# Patient Record
Sex: Male | Born: 1960
Health system: Southern US, Community
[De-identification: ages and names within clinical notes are randomized; demographics above are authoritative.]

## PROBLEM LIST (undated history)

## (undated) DIAGNOSIS — I10 Essential (primary) hypertension: Secondary | ICD-10-CM

## (undated) DIAGNOSIS — E785 Hyperlipidemia, unspecified: Secondary | ICD-10-CM

## (undated) DIAGNOSIS — E119 Type 2 diabetes mellitus without complications: Secondary | ICD-10-CM

## (undated) DIAGNOSIS — K219 Gastro-esophageal reflux disease without esophagitis: Secondary | ICD-10-CM

## (undated) DIAGNOSIS — M1990 Unspecified osteoarthritis, unspecified site: Secondary | ICD-10-CM

## (undated) HISTORY — DX: Type 2 diabetes mellitus without complications: E11.9

## (undated) HISTORY — DX: Hyperlipidemia, unspecified: E78.5

## (undated) HISTORY — DX: Essential (primary) hypertension: I10

## (undated) HISTORY — PX: ROOT CANAL: SHX2363

## (undated) SURGERY — EGD (ESOPHAGOGASTRODUODENOSCOPY)
Anesthesia: Moderate Sedation

---

## 2002-03-28 DIAGNOSIS — E119 Type 2 diabetes mellitus without complications: Secondary | ICD-10-CM

## 2002-03-28 HISTORY — DX: Type 2 diabetes mellitus without complications: E11.9

## 2005-03-28 HISTORY — PX: ANKLE SURGERY: SHX546

## 2005-06-18 ENCOUNTER — Emergency Department (HOSPITAL_COMMUNITY): Admission: EM | Admit: 2005-06-18 | Discharge: 2005-06-18 | Payer: Self-pay | Admitting: Emergency Medicine

## 2005-08-18 ENCOUNTER — Encounter (HOSPITAL_COMMUNITY): Admission: RE | Admit: 2005-08-18 | Discharge: 2005-09-17 | Payer: Self-pay | Admitting: Orthopedic Surgery

## 2005-09-20 ENCOUNTER — Encounter (HOSPITAL_COMMUNITY): Admission: RE | Admit: 2005-09-20 | Discharge: 2005-10-20 | Payer: Self-pay | Admitting: Orthopedic Surgery

## 2012-06-09 ENCOUNTER — Encounter: Payer: Self-pay | Admitting: *Deleted

## 2012-06-15 ENCOUNTER — Encounter: Payer: Self-pay | Admitting: Family Medicine

## 2012-06-15 ENCOUNTER — Ambulatory Visit: Payer: Self-pay | Admitting: Family Medicine

## 2012-06-15 ENCOUNTER — Ambulatory Visit (INDEPENDENT_AMBULATORY_CARE_PROVIDER_SITE_OTHER): Payer: Commercial Managed Care - PPO | Admitting: Family Medicine

## 2012-06-15 VITALS — BP 168/88 | HR 80 | Ht 70.0 in | Wt 212.0 lb

## 2012-06-15 DIAGNOSIS — E785 Hyperlipidemia, unspecified: Secondary | ICD-10-CM

## 2012-06-15 DIAGNOSIS — I10 Essential (primary) hypertension: Secondary | ICD-10-CM

## 2012-06-15 DIAGNOSIS — M25579 Pain in unspecified ankle and joints of unspecified foot: Secondary | ICD-10-CM

## 2012-06-15 DIAGNOSIS — M25572 Pain in left ankle and joints of left foot: Secondary | ICD-10-CM

## 2012-06-15 MED ORDER — HYDROCODONE-ACETAMINOPHEN 10-325 MG PO TABS: 1.0000 | ORAL_TABLET | ORAL | Status: DC | PRN

## 2012-06-15 MED ORDER — ATORVASTATIN CALCIUM 80 MG PO TABS: 80.0000 mg | ORAL_TABLET | Freq: Every day | ORAL | Status: DC

## 2012-06-15 MED ORDER — LOSARTAN POTASSIUM 100 MG PO TABS: 100.0000 mg | ORAL_TABLET | Freq: Every day | ORAL | Status: DC

## 2012-06-15 NOTE — Progress Notes (Signed)
  Subjective:    Patient ID: Terry [name], male    DOB: November 09, 1960, 52 y.o.   MRN: [id number]  Hypertension This is a chronic problem. The current episode started more than 1 year ago. The problem is unchanged. The problem is controlled. Pertinent negatives include no anxiety, blurred vision, chest pain, headaches, malaise/fatigue, neck pain, orthopnea, palpitations, peripheral edema, PND, shortness of breath or sweats. There are no associated agents to hypertension. Risk factors for coronary artery disease include diabetes mellitus, dyslipidemia, family history, male gender and obesity. Past treatments include angiotensin blockers and ACE inhibitors. The current treatment provides moderate improvement. There are no compliance problems.  There is no history of angina, kidney disease, CAD/MI, CVA, heart failure, left ventricular hypertrophy, PVD or retinopathy. There is no history of chronic renal disease, coarctation of the aorta or hyperparathyroidism.      Review of Systems  Constitutional: Negative for malaise/fatigue, activity change and appetite change.  HENT: Negative for neck pain.   Eyes: Negative for blurred vision.  Respiratory: Negative for shortness of breath.   Cardiovascular: Negative for chest pain, palpitations, orthopnea and PND.  Gastrointestinal: Negative for abdominal pain and abdominal distention.  Genitourinary: Negative for difficulty urinating.  Skin: Negative for color change, pallor and rash.  Neurological: Negative for headaches.       Objective:   Physical Exam  Constitutional: He appears well-developed.  HENT:  Head: Normocephalic.  Neck: Normal range of motion. Neck supple. No tracheal deviation present. No thyromegaly present.  Cardiovascular: Normal rate, regular rhythm and normal heart sounds.   Pulmonary/Chest: Effort normal and breath sounds normal. No respiratory distress. He has no wheezes.  Musculoskeletal: He exhibits no edema.           Assessment & Plan:  Unspecified essential hypertension - Plan: losartan (COZAAR) 100 MG tablet  Other and unspecified hyperlipidemia - Plan: atorvastatin (LIPITOR) 80 MG tablet  Arthralgia of ankle, left - Plan: HYDROcodone-acetaminophen (NORCO) 10-325 MG per tablet  Patient states his blood pressures very good outside the office I checked it twice here and it was still elevated his wife is a nurse she will check it several times over the next few weeks they will send [place] readings if it under good control no change in medication  He follows with a diabetic specialist for his other medicines although he may be shifting to at every six-month visit with them in followup with [place] in between these visits if so I told him I would like to see him in 3 months time.  He does have arthralgia of the ankle hydrocodone is used for his pain he denies abusing it. Recheck in 3 months time

## 2012-06-15 NOTE — Patient Instructions (Signed)
Stick with meds. Follow up in three months. Send bp readings

## 2012-06-22 ENCOUNTER — Ambulatory Visit: Payer: Self-pay | Admitting: Family Medicine

## 2012-07-08 ENCOUNTER — Other Ambulatory Visit: Payer: Self-pay | Admitting: Family Medicine

## 2012-07-20 ENCOUNTER — Other Ambulatory Visit: Payer: Self-pay | Admitting: Family Medicine

## 2012-08-16 ENCOUNTER — Telehealth: Payer: Self-pay | Admitting: Family Medicine

## 2012-08-16 NOTE — Telephone Encounter (Signed)
Patient needs BW paperwork. Patient also started [place] several months ago [employer]

## 2012-08-16 NOTE — Telephone Encounter (Signed)
Patient on [place]. Not sure what bloodwork to order.

## 2012-08-17 ENCOUNTER — Other Ambulatory Visit: Payer: Self-pay | Admitting: Family Medicine

## 2012-08-17 DIAGNOSIS — E785 Hyperlipidemia, unspecified: Secondary | ICD-10-CM

## 2012-08-17 DIAGNOSIS — IMO0001 Reserved for inherently not codable concepts without codable children: Secondary | ICD-10-CM

## 2012-08-17 NOTE — Telephone Encounter (Signed)
Patient was on Zetia. The lab forms were printed off. Wife will come by to pick them up.

## 2012-08-19 LAB — LIPID PANEL
Cholesterol: 138 mg/dL (ref 0–200)
HDL: 23 mg/dL — ABNORMAL LOW (ref >39)
Total CHOL/HDL Ratio: 6 Ratio
Triglycerides: 447 mg/dL — ABNORMAL HIGH (ref <150)

## 2012-08-19 LAB — BASIC METABOLIC PANEL
CO2: 29 mEq/L (ref 19–32)
Chloride: 102 mEq/L (ref 96–112)

## 2012-08-19 LAB — HEPATIC FUNCTION PANEL
Albumin: 4.2 g/dL (ref 3.5–5.2)
Total Bilirubin: 0.3 mg/dL (ref 0.3–1.2)

## 2012-08-27 ENCOUNTER — Encounter: Payer: Self-pay | Admitting: Family Medicine

## 2012-08-27 ENCOUNTER — Ambulatory Visit (INDEPENDENT_AMBULATORY_CARE_PROVIDER_SITE_OTHER): Payer: 59 | Admitting: Family Medicine

## 2012-08-27 VITALS — BP 170/93 | HR 98 | Wt 210.8 lb

## 2012-08-27 DIAGNOSIS — E781 Pure hyperglyceridemia: Secondary | ICD-10-CM | POA: Insufficient documentation

## 2012-08-27 DIAGNOSIS — M25579 Pain in unspecified ankle and joints of unspecified foot: Secondary | ICD-10-CM

## 2012-08-27 DIAGNOSIS — I10 Essential (primary) hypertension: Secondary | ICD-10-CM

## 2012-08-27 DIAGNOSIS — E785 Hyperlipidemia, unspecified: Secondary | ICD-10-CM

## 2012-08-27 DIAGNOSIS — M25572 Pain in left ankle and joints of left foot: Secondary | ICD-10-CM

## 2012-08-27 DIAGNOSIS — M2550 Pain in unspecified joint: Secondary | ICD-10-CM

## 2012-08-27 DIAGNOSIS — IMO0001 Reserved for inherently not codable concepts without codable children: Secondary | ICD-10-CM

## 2012-08-27 DIAGNOSIS — E119 Type 2 diabetes mellitus without complications: Secondary | ICD-10-CM | POA: Insufficient documentation

## 2012-08-27 MED ORDER — HYDROCODONE-ACETAMINOPHEN 10-325 MG PO TABS: 1.0000 | ORAL_TABLET | Freq: Four times a day (QID) | ORAL | Status: DC | PRN

## 2012-08-27 MED ORDER — NAPROXEN 500 MG PO TABS: 500.0000 mg | ORAL_TABLET | Freq: Two times a day (BID) | ORAL | Status: DC

## 2012-08-27 NOTE — Progress Notes (Signed)
  Subjective:    Patient ID: Terry [name], male    DOB: Aug 03, 1960, 52 y.o.   MRN: [id number]  Hand Pain  The incident occurred more than 1 week ago. There was no injury mechanism. The pain is present in the left hand and right hand. The quality of the pain is described as aching and shooting. The pain does not radiate. The pain is at a severity of 3/10. The pain has been fluctuating since the incident. Pertinent negatives include no numbness or tingling. He has tried nothing for the symptoms.   Patient's diabetes fairly good control #oh pretty good but he does admit to eating too much starches and breads and potatoes. He is going to do better on his diet and take his medicines as directed. Also his endocrinologist they did lab work that showed his LDL particles low density were very high and recommended that he take Zetia but he cannot afford this medicine. He does not smoke. He does state he has a lot of joint discomforts more so in his hands but also in his hip.   Review of Systems  Neurological: Negative for tingling and numbness.  Denies chest pressure tightness pain fever sweats chills.     Objective:   Physical Exam Lungs are clear, heart is regular, pulse normal, foot exam is normal. Pulses are normal. Calf is normal. Knees are normal has decreased range of motion in the hips he does have subjective arthralgias in his hands no apparent swelling but somewhat limited range of motion. Skin warm dry.       Assessment & Plan:  #1 arthralgias-his endocrinologist had done some lab testing on him and told him he did not have rheumatoid arthritis. Naprosyn 500 mg twice a day, hydrocodone maximum 3-4 times per day cautioned drowsiness. #2 diabetes fairly good control continue current measures recheck this again in 3 months time #3 hypertension-his wife is a nurse she checks blood pressures at home and they have actually been pretty good she will continue these measures and bring his blood  pressure monitor with him on the next visit Patient should followup again in approximately 3-4 months and will need some lab work before that visit. He is going back up on his statin dose and stopping Zetia

## 2012-10-10 ENCOUNTER — Other Ambulatory Visit: Payer: Self-pay | Admitting: Family Medicine

## 2012-11-05 ENCOUNTER — Other Ambulatory Visit: Payer: Self-pay | Admitting: Family Medicine

## 2012-11-08 ENCOUNTER — Other Ambulatory Visit: Payer: Self-pay

## 2012-11-30 ENCOUNTER — Ambulatory Visit: Payer: 59 | Admitting: Family Medicine

## 2012-12-05 ENCOUNTER — Encounter: Payer: Self-pay | Admitting: Family Medicine

## 2012-12-05 ENCOUNTER — Ambulatory Visit (INDEPENDENT_AMBULATORY_CARE_PROVIDER_SITE_OTHER): Payer: 59 | Admitting: Family Medicine

## 2012-12-05 VITALS — BP 168/88 | Ht 70.0 in | Wt 204.0 lb

## 2012-12-05 DIAGNOSIS — M25572 Pain in left ankle and joints of left foot: Secondary | ICD-10-CM

## 2012-12-05 DIAGNOSIS — E785 Hyperlipidemia, unspecified: Secondary | ICD-10-CM

## 2012-12-05 DIAGNOSIS — M25579 Pain in unspecified ankle and joints of unspecified foot: Secondary | ICD-10-CM

## 2012-12-05 DIAGNOSIS — E119 Type 2 diabetes mellitus without complications: Secondary | ICD-10-CM

## 2012-12-05 DIAGNOSIS — I10 Essential (primary) hypertension: Secondary | ICD-10-CM

## 2012-12-05 MED ORDER — HYDROCODONE-ACETAMINOPHEN 10-325 MG PO TABS: ORAL_TABLET | ORAL | Status: DC

## 2012-12-05 MED ORDER — ATORVASTATIN CALCIUM 80 MG PO TABS: 80.0000 mg | ORAL_TABLET | Freq: Every day | ORAL | Status: DC

## 2012-12-05 MED ORDER — LOSARTAN POTASSIUM 100 MG PO TABS: ORAL_TABLET | ORAL | Status: DC

## 2012-12-05 MED ORDER — NAPROXEN 500 MG PO TABS: 500.0000 mg | ORAL_TABLET | Freq: Two times a day (BID) | ORAL | Status: DC

## 2012-12-05 NOTE — Patient Instructions (Signed)
As part of your visit today we have covered chronic pain. You have been given prescription for pain medicines. Certain pain medications such as hydrocodone  allow for refills. Other pain medications such as oxycodone require separate prescriptions. Nonetheless, your expected to come in for a office visit before further pain medications are issued.   We will not refill medications or early nor will we give an extended month supply at the end of these prescriptions.It is your responsibility to keep up with medications. They will not be replaced.  It is your responsibility to schedule an office visit in 3-4 months to be seen before you are out of your medication. Do not call our office to request early refills or additional refills.   We are required by law to adhere to regulations. Failure on our part to follow these regulations could jeopardize our prescription license which in turn would cause us not to be able to care for you.  

## 2012-12-05 NOTE — Progress Notes (Signed)
  Subjective:    Patient ID: Terry [name], male    DOB: 12/20/1960, 52 y.o.   MRN: [id number]  HPI Patient arrives for a follow up on his diabetes and hypertension. The patient was seen today as part of a comprehensive diabetic check up. The patient had the following elements completed: -Review of medication compliance -Review of glucose monitoring results -Review of any complications do to high or low sugars -Diabetic foot exam was completed as part of today's visit. The following was also discussed: -Importance of yearly eye exams -Importance of following diabetic/low sugar-starch diet -Importance of exercise and regular activity -Importance of regular followup visits. -Most recent hemoglobin A1c were reviewed with the patient along with goals regarding diabetes. This patient was seen today for chronic pain  The medication list was reviewed and updated.  Discussion was held with the patient regarding compliance with pain medication. The patient was advised the importance of maintaining medication and not using illegal substances with these. The patient was educated that we can provide 3 monthly scripts for their medication, it is their responsibility to follow the instructions. Discussion was held with the patient to make sure they're not having significant side effects. Patient is aware that pain medications are meant to minimize the severity of the pain to allow their pain levels to improve to allow for better function. They are aware of that pain medications cannot totally remove their pain.     Review of Systems  Constitutional: Negative for fever, activity change, appetite change and fatigue.  HENT: Negative for congestion and rhinorrhea.   Respiratory: Negative for cough.   Cardiovascular: Negative for chest pain, palpitations and leg swelling.  Gastrointestinal: Negative for abdominal pain.   Eye exam yearly rec, paced states he's doing later this year Family history  reviewed social does not smoke    Objective:   Physical Exam Eardrums normal throat is normal neck no masses lungs clear no crackles heart is regular abdomen soft no masses straining he is no edema sensory to touch diminished diabetic foot exam completed       Assessment & Plan:  Lipid / liv /met 7 /ur microprot Hyperlipidemia continue medication watch diet closely Diabetes fairly decent control watch diet exercise continue medication Chronic pain-prescriptions written followup 3 months Hypertension elevated here in the office but when checked with his monitor it comes very close to ours and his blood pressure readings at home look great I believe he has element of whitecoat hypertension

## 2013-01-14 ENCOUNTER — Ambulatory Visit (INDEPENDENT_AMBULATORY_CARE_PROVIDER_SITE_OTHER): Payer: 59 | Admitting: Family Medicine

## 2013-01-14 ENCOUNTER — Encounter: Payer: Self-pay | Admitting: Family Medicine

## 2013-01-14 VITALS — BP 160/90 | Temp 99.5°F | Ht 70.0 in | Wt 204.8 lb

## 2013-01-14 DIAGNOSIS — J029 Acute pharyngitis, unspecified: Secondary | ICD-10-CM

## 2013-01-14 LAB — POCT RAPID STREP A (OFFICE): Rapid Strep A Screen: NEGATIVE

## 2013-01-14 MED ORDER — VALACYCLOVIR HCL 1 G PO TABS: 1000.0000 mg | ORAL_TABLET | Freq: Three times a day (TID) | ORAL | Status: AC

## 2013-01-14 NOTE — Progress Notes (Signed)
  Subjective:    Patient ID: Terry [name], male    DOB: [birth year], 52 y.o.   MRN: [id number]  Sore Throat  This is a new problem. The current episode started in the past 7 days. Neither side of throat is experiencing more pain than the other. Maximum temperature: 99.5. The fever has been present for 1 to 2 days. Associated symptoms include headaches and trouble swallowing. Associated symptoms comments: Body aches. He has tried NSAIDs for the symptoms. The treatment provided mild relief.   he does have a history of diabetes he states that his numbers have actually gone up to some degree. He also relates low-grade fever. Doesn't feel good. Denies sweats chills nausea vomiting shortness of breath.  Social doesn't smoke  Review of Systems  HENT: Positive for trouble swallowing.   Neurological: Positive for headaches.       Objective:   Physical Exam Lungs are clear hearts regular neck is supple significant viral stomatitis noted.  Rapid strep test negative.     Assessment & Plan:  Viral stomatitis-Valtrex 3 times a day for the next 7 days, patient relates that causes pain with eating I told him he may use Orabase-B as needed cautioned him in. In addition to this followup if ongoing troubles this may take up to a couple weeks to go away Diabetes-his numbers are going to be somewhat elevated over the course of next 7-10 days. He may adjust up on his insulin as necessary to get it under control

## 2013-01-15 LAB — STREP A DNA PROBE: GASP: NEGATIVE

## 2013-01-17 NOTE — Progress Notes (Signed)
Sent card regarding: Call  patient their test/xrays are normal, informed patient secondary test on throat swab was negative for strep he ought to see some improvement in his throat by the end of the week.

## 2013-01-31 ENCOUNTER — Other Ambulatory Visit: Payer: Self-pay

## 2013-02-25 LAB — HM DIABETES EYE EXAM

## 2013-04-01 ENCOUNTER — Telehealth: Payer: Self-pay | Admitting: Family Medicine

## 2013-04-01 NOTE — Telephone Encounter (Signed)
Prior Auth obtained for patient's ACCU-CHECK COMPACT PLUS diabetic test strips, pt is approved for 600 strips/30 days, auth expires 04/01/14 through [facility] ([employer]) Faxed approval to [employer]

## 2013-05-10 ENCOUNTER — Other Ambulatory Visit: Payer: Self-pay | Admitting: Family Medicine

## 2013-05-11 LAB — BASIC METABOLIC PANEL
BUN: 12 mg/dL (ref 6–23)
CO2: 31 mEq/L (ref 19–32)
Calcium: 9.8 mg/dL (ref 8.4–10.5)
Chloride: 99 mEq/L (ref 96–112)
Creat: 0.98 mg/dL (ref 0.50–1.35)
Glucose, Bld: 169 mg/dL — ABNORMAL HIGH (ref 70–99)
Potassium: 4.2 mEq/L (ref 3.5–5.3)
Sodium: 138 mEq/L (ref 135–145)

## 2013-05-11 LAB — HEPATIC FUNCTION PANEL
ALT: 29 U/L (ref 0–53)
AST: 21 U/L (ref 0–37)
Albumin: 4.3 g/dL (ref 3.5–5.2)
Alkaline Phosphatase: 49 U/L (ref 39–117)
Bilirubin, Direct: 0.1 mg/dL (ref 0.0–0.3)
Indirect Bilirubin: 0.3 mg/dL (ref 0.2–1.2)
Total Bilirubin: 0.4 mg/dL (ref 0.2–1.2)
Total Protein: 6.9 g/dL (ref 6.0–8.3)

## 2013-05-11 LAB — MICROALBUMIN, URINE: Microalb, Ur: 3.36 mg/dL — ABNORMAL HIGH (ref 0.00–1.89)

## 2013-05-11 LAB — LIPID PANEL
Cholesterol: 169 mg/dL (ref 0–200)
HDL: 27 mg/dL — ABNORMAL LOW (ref >39)
Total CHOL/HDL Ratio: 6.3 Ratio
Triglycerides: 415 mg/dL — ABNORMAL HIGH (ref <150)

## 2013-05-13 ENCOUNTER — Encounter: Payer: Self-pay | Admitting: Family Medicine

## 2013-05-21 ENCOUNTER — Encounter: Payer: Self-pay | Admitting: Family Medicine

## 2013-05-21 ENCOUNTER — Ambulatory Visit (INDEPENDENT_AMBULATORY_CARE_PROVIDER_SITE_OTHER): Payer: 59 | Admitting: Family Medicine

## 2013-05-21 VITALS — BP 138/98 | Ht 70.0 in | Wt 205.0 lb

## 2013-05-21 DIAGNOSIS — IMO0001 Reserved for inherently not codable concepts without codable children: Secondary | ICD-10-CM

## 2013-05-21 DIAGNOSIS — I10 Essential (primary) hypertension: Secondary | ICD-10-CM

## 2013-05-21 DIAGNOSIS — E785 Hyperlipidemia, unspecified: Secondary | ICD-10-CM

## 2013-05-21 DIAGNOSIS — E119 Type 2 diabetes mellitus without complications: Secondary | ICD-10-CM

## 2013-05-21 DIAGNOSIS — M2550 Pain in unspecified joint: Secondary | ICD-10-CM

## 2013-05-21 DIAGNOSIS — E1165 Type 2 diabetes mellitus with hyperglycemia: Secondary | ICD-10-CM

## 2013-05-21 LAB — POCT GLYCOSYLATED HEMOGLOBIN (HGB A1C): HEMOGLOBIN A1C: 8.2

## 2013-05-21 MED ORDER — OXYCODONE-ACETAMINOPHEN 10-325 MG PO TABS: ORAL_TABLET | ORAL | Status: DC

## 2013-05-21 MED ORDER — METFORMIN HCL ER 500 MG PO TB24: 500.0000 mg | ORAL_TABLET | Freq: Every day | ORAL | Status: DC

## 2013-05-21 NOTE — Progress Notes (Signed)
Subjective:    Patient ID: Terry [name], male    DOB: [birth year], 53 y.o.   MRN: [id number]  Diabetes He presents for his follow-up diabetic visit. He has type 2 diabetes mellitus. His breakfast blood glucose range is generally >200 mg/dl. His lunch blood glucose range is generally >200 mg/dl. His dinner blood glucose range is generally >200 mg/dl. His highest blood glucose is >200 mg/dl. His overall blood glucose range is >200 mg/dl. He does not see a podiatrist.Eye exam is current.   Need rx for relion pen needles.   Discuss changing hydrocodone to oxycodone. He relates his arthralgias been bothering him a lot hydrocodone no longer works see discussion below. He denies abusing medication. He understands that pain medication is to allow for the pain to come under better control he works with his hands a lot he suffers with arthralgias in both hands each morning and during the day more so on today's work he works a lot also suffers with knee pain when he is on his knees a lot  He relates he could be doing a better job with diet and understands that he needs to get that under better control he does relate compliance with cholesterol medicine.  He does use Cialis occasionally he stated that he will discuss at [place] the generic for Viagra and if he wanted to try that he will let us   Review of Systems He denies blurred vision he does relate increased thirst he denies fever chills denies sweats nausea vomiting. Denies chest tightness pressure pain.    Objective:   Physical Exam Neck no masses lungs are clear heart is regular no murmurs pulses are normal abdomen is soft no masses extremities no edema foot exam is normal       Assessment & Plan:  #1 diabetes-subpar control. We talked about adjustment of his insulin. He is currently using the long-acting insulin he splits into 2 doses. I told him he needs to gradually go up by 4-8 units per week until his fasting sugars are in  the 100-120 range. Hopefully this will allow him to get better control urine today without as much short acting insulin. He is to continue the short acting insulin with meals and with snacks. He typically is taking approximately 6-8 shots per day. Sometimes as many as 10. This needs to get under better control. Patient understands this. He will also continue Victoza I also encouraged him to be much better at minimizing starches in his diet finally we will also add metformin XR 500 mg one each day HE will let us know if he does not tolerate this.  #2 HTN patient relates blood pressure readings that are outside the office he is to work harder at watching diet you also try to fit in some walking.  #3 micro-per proteinuria he is work hard on diet getting A1c under better control continue low starch and  #4 elevated triglycerides this is secondary to his diabetes not been under good control he can continue fish oil but he must do a better job getting diabetes under better control  #5 arthralgias-hydrocodone no longer working for him. He does admit that he took a half of his wife's oxycodone and it did help him. I wrote him a prescription for Percocet 10 mg/325 one every 6 hours as needed for severe pain use sparingly he was encouraged only to use his medicines and he was also told that his medicines are for him and not  her family.  He currently cannot afford the co-pays with the specialist and he will followup here. We will do our best for him  Patient is to followup in 3-4 months on next visit he needs to do a hemoglobin A1c, lipid profile, PSA

## 2013-05-21 NOTE — Patient Instructions (Signed)
Levimir- keep bumping up the dose from week to week (4 to 8 units per week ) gradually getting the am fasting glucose to be near 100  Recheck A1 C here in approx 3 to 4 months  Add Metformin XR 500 one daily- if can not tolerate please let me know  We will use Percocet for your pain , avoid taking frequently in order to lessen risk of addiction.

## 2013-05-24 ENCOUNTER — Other Ambulatory Visit: Payer: Self-pay | Admitting: Family Medicine

## 2013-06-13 ENCOUNTER — Telehealth: Payer: Self-pay | Admitting: Family Medicine

## 2013-06-13 MED ORDER — SULFACETAMIDE SODIUM 10 % OP SOLN: 1.0000 [drp] | Freq: Four times a day (QID) | OPHTHALMIC | Status: AC

## 2013-06-13 NOTE — Telephone Encounter (Signed)
Sulfacetamide drops one to 2 drops each eye 4 times daily for the next 5 days, followup if ongoing troubles

## 2013-06-13 NOTE — Telephone Encounter (Signed)
Pt is having heavy drainage in both eyes, a yellowish drainage Its constant through out the day   Can we call him in something for this to [facility]

## 2013-06-13 NOTE — Telephone Encounter (Signed)
Rx sent electronically to pharmacy. Patient notified. 

## 2013-06-19 ENCOUNTER — Other Ambulatory Visit: Payer: Self-pay | Admitting: Family Medicine

## 2013-07-09 ENCOUNTER — Telehealth: Payer: Self-pay | Admitting: Family Medicine

## 2013-07-09 MED ORDER — INSULIN LISPRO 100 UNIT/ML (KWIKPEN): PEN_INJECTOR | SUBCUTANEOUS | Status: DC

## 2013-07-09 NOTE — Telephone Encounter (Signed)
Rx sent electronically to pharmacy. 

## 2013-07-09 NOTE — Telephone Encounter (Signed)
Patient requesting refill on Humalog Kwikpen 45, Inject up to 140 to 150 units subcutaneously every day. [place]

## 2013-07-25 ENCOUNTER — Telehealth: Payer: Self-pay | Admitting: Family Medicine

## 2013-07-25 MED ORDER — OXYCODONE-ACETAMINOPHEN 10-325 MG PO TABS: ORAL_TABLET | ORAL | Status: DC

## 2013-07-25 NOTE — Telephone Encounter (Signed)
Last seen 05/21/13

## 2013-07-25 NOTE — Telephone Encounter (Signed)
Patient needs Rx for oxycodone °

## 2013-07-25 NOTE — Telephone Encounter (Signed)
May have a prescription refill on this. Explained to patient it is a schedule 2 drug I would need to see him by the end of May for his next refill

## 2013-07-25 NOTE — Telephone Encounter (Signed)
[name] was notified. She will pick up prescription.

## 2013-08-15 ENCOUNTER — Other Ambulatory Visit: Payer: Self-pay | Admitting: Family Medicine

## 2013-08-29 ENCOUNTER — Encounter: Payer: Self-pay | Admitting: Family Medicine

## 2013-08-29 ENCOUNTER — Ambulatory Visit (INDEPENDENT_AMBULATORY_CARE_PROVIDER_SITE_OTHER): Payer: 59 | Admitting: Family Medicine

## 2013-08-29 VITALS — BP 178/100 | Ht 70.0 in | Wt 211.0 lb

## 2013-08-29 DIAGNOSIS — IMO0001 Reserved for inherently not codable concepts without codable children: Secondary | ICD-10-CM

## 2013-08-29 DIAGNOSIS — E785 Hyperlipidemia, unspecified: Secondary | ICD-10-CM

## 2013-08-29 DIAGNOSIS — M2550 Pain in unspecified joint: Secondary | ICD-10-CM

## 2013-08-29 DIAGNOSIS — I10 Essential (primary) hypertension: Secondary | ICD-10-CM

## 2013-08-29 DIAGNOSIS — E1165 Type 2 diabetes mellitus with hyperglycemia: Principal | ICD-10-CM

## 2013-08-29 LAB — POCT GLYCOSYLATED HEMOGLOBIN (HGB A1C): Hemoglobin A1C: 7.8

## 2013-08-29 MED ORDER — INSULIN LISPRO 100 UNIT/ML (KWIKPEN): PEN_INJECTOR | SUBCUTANEOUS | Status: DC

## 2013-08-29 MED ORDER — INSULIN DETEMIR 100 UNIT/ML FLEXPEN: PEN_INJECTOR | SUBCUTANEOUS | Status: DC

## 2013-08-29 MED ORDER — OXYCODONE-ACETAMINOPHEN 10-325 MG PO TABS: ORAL_TABLET | ORAL | Status: DC

## 2013-08-29 MED ORDER — LIRAGLUTIDE 18 MG/3ML ~~LOC~~ SOPN: PEN_INJECTOR | SUBCUTANEOUS | Status: DC

## 2013-08-29 MED ORDER — NAPROXEN 500 MG PO TABS: 500.0000 mg | ORAL_TABLET | Freq: Two times a day (BID) | ORAL | Status: DC

## 2013-08-29 MED ORDER — LOSARTAN POTASSIUM 100 MG PO TABS: ORAL_TABLET | ORAL | Status: DC

## 2013-08-29 MED ORDER — METFORMIN HCL ER 500 MG PO TB24: 500.0000 mg | ORAL_TABLET | Freq: Every day | ORAL | Status: DC

## 2013-08-29 MED ORDER — ATORVASTATIN CALCIUM 80 MG PO TABS: 80.0000 mg | ORAL_TABLET | Freq: Every day | ORAL | Status: DC

## 2013-08-29 NOTE — Progress Notes (Signed)
   Subjective:    Patient ID: Terry [name], male    DOB: Aug 29, 1960, 53 y.o.   MRN: [id number]  Diabetes He presents for his follow-up diabetic visit. He has type 2 diabetes mellitus. Pertinent negatives for hypoglycemia include no confusion. Pertinent negatives for diabetes include no chest pain, no fatigue, no polydipsia, no polyphagia and no weakness. Current diabetic treatment includes insulin injections and oral agent (monotherapy). He is compliant with treatment all of the time. He is currently taking insulin pre-breakfast, pre-lunch, pre-dinner and at bedtime. Insulin injections are given by patient. Rotation sites for injection include the lower legs and thighs. He participates in exercise intermittently. He monitors blood glucose at home 5+ x per day. Blood glucose monitoring compliance is excellent. His overall blood glucose range is 140-180 mg/dl. He does not see a podiatrist.Eye exam is current.    Patient would like to discuss the process of getting his meds refilled.     Review of Systems  Constitutional: Negative for activity change, appetite change and fatigue.  HENT: Negative for congestion.   Respiratory: Negative for cough.   Cardiovascular: Negative for chest pain.  Gastrointestinal: Negative for abdominal pain.  Endocrine: Negative for polydipsia and polyphagia.  Neurological: Negative for weakness.  Psychiatric/Behavioral: Negative for confusion.       Objective:   Physical Exam  Vitals reviewed. Constitutional: He appears well-nourished. No distress.  Cardiovascular: Normal rate, regular rhythm and normal heart sounds.   No murmur heard. Pulmonary/Chest: Effort normal and breath sounds normal. No respiratory distress.  Abdominal: Soft.  Musculoskeletal: He exhibits no edema.  Lymphadenopathy:    He has no cervical adenopathy.  Neurological: He is alert.  Skin: Skin is warm and dry. No rash noted. No erythema.  Psychiatric: He has a normal mood and affect.  His behavior is normal.          Assessment & Plan:  All of his medications were updated and field.  2 prescriptions oxycodone given that should last him for the next 3 months  Diabetes fair control he might consider stopping Victoza and trying Invokana. He will titrate up on insulin as directed followup 3 months goal is to get the A1c down to 7  Third no cardiac symptoms patient is having currently no lab work needs to be drawn today lab work later this summer

## 2013-09-23 ENCOUNTER — Telehealth: Payer: Self-pay | Admitting: Family Medicine

## 2013-09-23 MED ORDER — SILDENAFIL CITRATE 20 MG PO TABS: ORAL_TABLET | ORAL | Status: DC

## 2013-09-23 MED ORDER — CANAGLIFLOZIN 100 MG PO TABS: 100.0000 mg | ORAL_TABLET | Freq: Every day | ORAL | Status: DC

## 2013-09-23 NOTE — Telephone Encounter (Signed)
Patient needs Rx for sildemafil and invokanna. He has checked with insurance company as asked by Dr. Nicki Reaper. Jonni Sanger does have these both in stock at the pharmacy. [facility].

## 2013-09-23 NOTE — Telephone Encounter (Signed)
Notified patient's wife Jeannene Patella) stop Victoza ( d/c med) start Invokana 100 mg , #30, 1 daily, 6 refills. Followup in approximately 3-4 months to check A1c. Might increase the dose of the medicine at that time depending on what his test result shows. Also sildenafil 20 mg, may take from 1-5 tablets before sexual activity as directed. No greater than 5 in the day. 25 tablets, 6 refills. Meds sent to pharmacy. [name] verbalized understanding.

## 2013-09-23 NOTE — Telephone Encounter (Signed)
Stop Victoza ( d/c med) start Invokana 100 mg , #30, 1 daily, 6 refills. Followup in approximately 3-4 months to check A1c. Might increase the dose of the medicine at that time depending on what his test result shows. Also sildenafil 20 mg, may take from 1-5 tablets before sexual activity as directed. No greater than 5 in the day. 25 tablets, 6 refills

## 2013-10-23 ENCOUNTER — Encounter: Payer: Self-pay | Admitting: Family Medicine

## 2013-10-23 ENCOUNTER — Telehealth: Payer: Self-pay | Admitting: Family Medicine

## 2013-10-23 NOTE — Telephone Encounter (Signed)
Rx prior auth DENIED for pt's SILDENAFIL CITRATE, not covered to treat his condition, mailed letter to pt explaining that he may purchase out of pocket if he chooses to

## 2013-11-14 ENCOUNTER — Telehealth: Payer: Self-pay | Admitting: Family Medicine

## 2013-11-14 MED ORDER — INSULIN DETEMIR 100 UNIT/ML FLEXPEN: PEN_INJECTOR | SUBCUTANEOUS | Status: DC

## 2013-11-14 NOTE — Telephone Encounter (Signed)
Refill Levemir Flex Touch. Takes 60 units in am and 60 units in pm. Rx now reads 80 to 100 units daily and this won't cover amount needed. [place].

## 2013-11-14 NOTE — Telephone Encounter (Signed)
Medication sent to pharmacy. Terry [name] was notified.

## 2013-12-22 ENCOUNTER — Other Ambulatory Visit: Payer: Self-pay | Admitting: Family Medicine

## 2014-01-03 ENCOUNTER — Other Ambulatory Visit: Payer: Self-pay | Admitting: Family Medicine

## 2014-01-07 ENCOUNTER — Telehealth: Payer: Self-pay | Admitting: Family Medicine

## 2014-01-07 DIAGNOSIS — E785 Hyperlipidemia, unspecified: Secondary | ICD-10-CM

## 2014-01-07 DIAGNOSIS — Z79899 Other long term (current) drug therapy: Secondary | ICD-10-CM

## 2014-01-07 DIAGNOSIS — Z125 Encounter for screening for malignant neoplasm of prostate: Secondary | ICD-10-CM

## 2014-01-07 DIAGNOSIS — E119 Type 2 diabetes mellitus without complications: Secondary | ICD-10-CM

## 2014-01-07 NOTE — Telephone Encounter (Signed)
A1c, lipid, liver, metabolic 7, also nurses it patient has not had PSA within the past year please do

## 2014-01-07 NOTE — Telephone Encounter (Signed)
05/11/13 Lipid, liver, bmp, microalb urine

## 2014-01-07 NOTE — Telephone Encounter (Signed)
bloodwork orders ready. Pt's wife notified.

## 2014-01-07 NOTE — Telephone Encounter (Signed)
Patient would like order for blood work.

## 2014-01-14 ENCOUNTER — Ambulatory Visit: Payer: 59 | Admitting: Family Medicine

## 2014-01-30 ENCOUNTER — Ambulatory Visit (INDEPENDENT_AMBULATORY_CARE_PROVIDER_SITE_OTHER): Payer: 59 | Admitting: Family Medicine

## 2014-01-30 ENCOUNTER — Encounter: Payer: Self-pay | Admitting: Family Medicine

## 2014-01-30 VITALS — BP 156/92 | Ht 70.0 in | Wt 201.0 lb

## 2014-01-30 DIAGNOSIS — E785 Hyperlipidemia, unspecified: Secondary | ICD-10-CM

## 2014-01-30 DIAGNOSIS — Z23 Encounter for immunization: Secondary | ICD-10-CM

## 2014-01-30 DIAGNOSIS — M2550 Pain in unspecified joint: Secondary | ICD-10-CM

## 2014-01-30 DIAGNOSIS — E118 Type 2 diabetes mellitus with unspecified complications: Secondary | ICD-10-CM

## 2014-01-30 DIAGNOSIS — I10 Essential (primary) hypertension: Secondary | ICD-10-CM

## 2014-01-30 LAB — POCT GLYCOSYLATED HEMOGLOBIN (HGB A1C): Hemoglobin A1C: 8

## 2014-01-30 MED ORDER — OXYCODONE-ACETAMINOPHEN 10-325 MG PO TABS: ORAL_TABLET | ORAL | Status: DC

## 2014-01-30 NOTE — Progress Notes (Signed)
   Subjective:    Patient ID: Terry [name], male    DOB: [birth year], 53 y.o.   MRN: [id number]  Diabetes He presents for his follow-up diabetic visit. He has type 2 diabetes mellitus. Hypoglycemia symptoms include nervousness/anxiousness, sweats and tremors. Pertinent negatives for hypoglycemia include no confusion. There are no diabetic associated symptoms. Pertinent negatives for diabetes include no chest pain, no fatigue, no polydipsia, no polyphagia and no weakness. He is compliant with treatment all of the time. He is currently taking insulin pre-breakfast, pre-lunch, pre-dinner and at bedtime. Exercise: work. He monitors blood glucose at home 5+ x per day. He does not see a podiatrist.Eye exam is not current.   This patient was seen today for chronic pain  The medication list was reviewed and updated.   -Compliance with pain medication: good  The patient was advised the importance of maintaining medication and not using illegal substances with these.  Refills needed: yes he ran out  The patient was educated that we can provide 3 monthly scripts for their medication, it is their responsibility to follow the instructions.  Side effects or complications from medications: denies side effects  Patient is aware that pain medications are meant to minimize the severity of the pain to allow their pain levels to improve to allow for better function. They are aware of that pain medications cannot totally remove their pain.  Due for UDT ( at least once per year) : next visit    25 minutes spent discussing all of his issues going over his medications giving refills and discussing the control of diabetes    Review of Systems  Constitutional: Negative for activity change, appetite change and fatigue.  HENT: Negative for congestion.   Respiratory: Negative for cough.   Cardiovascular: Negative for chest pain.  Gastrointestinal: Negative for abdominal pain.  Endocrine: Negative for  polydipsia and polyphagia.  Neurological: Positive for tremors. Negative for weakness.  Psychiatric/Behavioral: Negative for confusion. The patient is nervous/anxious.        Objective:   Physical Exam  Constitutional: He appears well-nourished. No distress.  Cardiovascular: Normal rate, regular rhythm and normal heart sounds.   No murmur heard. Pulmonary/Chest: Effort normal and breath sounds normal. No respiratory distress.  Musculoskeletal: He exhibits no edema.  Lymphadenopathy:    He has no cervical adenopathy.  Neurological: He is alert.  Psychiatric: His behavior is normal.  Vitals reviewed.   Colonoscopy recommended      Assessment & Plan:  Vaccines today Hyperlipidemia continue Lipitor Diabetes subpar control improved diet to better job with eating small meals frequently recheck A1c 3 months send [place] some readings in a few weeks time Blood pressure borderline follow blood pressures closely at home send [place] some readings. Chronic pain in joints 3 prescriptions for oxycodone given

## 2014-02-18 ENCOUNTER — Telehealth: Payer: Self-pay | Admitting: Family Medicine

## 2014-02-18 ENCOUNTER — Other Ambulatory Visit: Payer: Self-pay | Admitting: *Deleted

## 2014-02-18 MED ORDER — AMLODIPINE BESYLATE 5 MG PO TABS: 5.0000 mg | ORAL_TABLET | Freq: Every day | ORAL | Status: DC

## 2014-02-18 NOTE — Telephone Encounter (Signed)
Add amlodipine 5 mg , 1 qd ,#30, 2 refills, rec ov in 2 to 3 weeks to recheck bp

## 2014-02-18 NOTE — Telephone Encounter (Signed)
Med sent to pharm. Pt's wife notified. She states she will call back to schedule follow up office visit.

## 2014-02-18 NOTE — Telephone Encounter (Signed)
Per your request they are calling to say Pts BP has been running in the low to mid 190's  190/96, 187/94, these are times when he felt it was high 163/77, 138/78 these are times when he's more relaxed   They are thinking at this point he's going to need a BP med change   He's lost weight and eating better as well, but still running high   [facility] pharm

## 2014-02-25 LAB — HEPATIC FUNCTION PANEL
ALBUMIN: 4.4 g/dL (ref 3.5–5.2)
ALT: 31 U/L (ref 0–53)
AST: 20 U/L (ref 0–37)
Alkaline Phosphatase: 54 U/L (ref 39–117)
BILIRUBIN DIRECT: 0.1 mg/dL (ref 0.0–0.3)
Indirect Bilirubin: 0.4 mg/dL (ref 0.2–1.2)
Total Bilirubin: 0.5 mg/dL (ref 0.2–1.2)
Total Protein: 7 g/dL (ref 6.0–8.3)

## 2014-02-25 LAB — BASIC METABOLIC PANEL
BUN: 12 mg/dL (ref 6–23)
CO2: 30 meq/L (ref 19–32)
CREATININE: 0.91 mg/dL (ref 0.50–1.35)
Calcium: 9.4 mg/dL (ref 8.4–10.5)
Chloride: 101 mEq/L (ref 96–112)
GLUCOSE: 243 mg/dL — AB (ref 70–99)
Potassium: 4.8 mEq/L (ref 3.5–5.3)
Sodium: 140 mEq/L (ref 135–145)

## 2014-02-25 LAB — LIPID PANEL
CHOL/HDL RATIO: 4.4 ratio
CHOLESTEROL: 148 mg/dL (ref 0–200)
HDL: 34 mg/dL — AB (ref >39)
LDL Cholesterol: 79 mg/dL (ref 0–99)
Triglycerides: 174 mg/dL — ABNORMAL HIGH (ref <150)
VLDL: 35 mg/dL (ref 0–40)

## 2014-02-25 LAB — HEMOGLOBIN A1C
Hgb A1c MFr Bld: 8.9 % — ABNORMAL HIGH (ref <5.7)
Mean Plasma Glucose: 209 mg/dL — ABNORMAL HIGH (ref <117)

## 2014-02-26 LAB — PSA: PSA: 0.16 ng/mL (ref <=4.00)

## 2014-04-17 ENCOUNTER — Other Ambulatory Visit: Payer: Self-pay | Admitting: Family Medicine

## 2014-04-24 ENCOUNTER — Telehealth: Payer: Self-pay | Admitting: Family Medicine

## 2014-04-24 MED ORDER — INSULIN ASPART 100 UNIT/ML FLEXPEN: PEN_INJECTOR | SUBCUTANEOUS | Status: DC

## 2014-04-24 MED ORDER — INSULIN GLARGINE 100 UNIT/ML SOLOSTAR PEN: PEN_INJECTOR | SUBCUTANEOUS | Status: DC

## 2014-04-24 MED ORDER — GLUCOSE BLOOD VI STRP: ORAL_STRIP | Status: DC

## 2014-04-24 NOTE — Telephone Encounter (Signed)
Medications sent to pharmacy

## 2014-04-24 NOTE — Telephone Encounter (Signed)
May have the requested changes 6 month supply, OV in March or April

## 2014-04-24 NOTE — Telephone Encounter (Signed)
Pt's insurance changed 03/28/14 to [place].  Please advise.Marland Kitchen Please call pt when done [phone]  Pt's LEVEMIR FLEXTOUCH 100 UNIT/ML Pen is a tier 4, can we change to Lantus Solostar pen that is covered at a tier 3 with no PA req'd? May have quantity limits  (Or we can try to get prior auth'd, pt almost out of this med)  Pt's HUMALOG KWIKPEN 100 UNIT/ML KiwkPen is a tier 4, can we change to Novolog FlexPen that is covered at a tier 3 with no PA req'd? (Or we can try to get prior auth'd)  Pt's ACCU-CHEK COMPACT PLUS test strip is no longer covered, please change to [employer] or LifeScan One Touch, will need order for meter, test strips, lancets  Pt uses [employer]

## 2014-05-06 ENCOUNTER — Ambulatory Visit (INDEPENDENT_AMBULATORY_CARE_PROVIDER_SITE_OTHER): Payer: BLUE CROSS/BLUE SHIELD | Admitting: Family Medicine

## 2014-05-06 ENCOUNTER — Encounter: Payer: Self-pay | Admitting: Family Medicine

## 2014-05-06 VITALS — BP 136/88 | Ht 70.0 in | Wt 200.0 lb

## 2014-05-06 DIAGNOSIS — E118 Type 2 diabetes mellitus with unspecified complications: Secondary | ICD-10-CM

## 2014-05-06 DIAGNOSIS — E785 Hyperlipidemia, unspecified: Secondary | ICD-10-CM

## 2014-05-06 DIAGNOSIS — M2550 Pain in unspecified joint: Secondary | ICD-10-CM

## 2014-05-06 MED ORDER — OXYCODONE-ACETAMINOPHEN 10-325 MG PO TABS: ORAL_TABLET | ORAL | Status: DC

## 2014-05-06 NOTE — Patient Instructions (Signed)
Diabetes Mellitus and Food It is important for you to manage your blood sugar (glucose) level. Your blood glucose level can be greatly affected by what you eat. Eating healthier foods in the appropriate amounts throughout the day at about the same time each day will help you control your blood glucose level. It can also help slow or prevent worsening of your diabetes mellitus. Healthy eating may even help you improve the level of your blood pressure and reach or maintain a healthy weight.  HOW CAN FOOD AFFECT ME? Carbohydrates Carbohydrates affect your blood glucose level more than any other type of food. Your dietitian will help you determine how many carbohydrates to eat at each meal and teach you how to count carbohydrates. Counting carbohydrates is important to keep your blood glucose at a healthy level, especially if you are using insulin or taking certain medicines for diabetes mellitus. Alcohol Alcohol can cause sudden decreases in blood glucose (hypoglycemia), especially if you use insulin or take certain medicines for diabetes mellitus. Hypoglycemia can be a life-threatening condition. Symptoms of hypoglycemia (sleepiness, dizziness, and disorientation) are similar to symptoms of having too much alcohol.  If your health care provider has given you approval to drink alcohol, do so in moderation and use the following guidelines:  Women should not have more than one drink per day, and men should not have more than two drinks per day. One drink is equal to:  12 oz of beer.  5 oz of wine.  1 oz of hard liquor.  Do not drink on an empty stomach.  Keep yourself hydrated. Have water, diet soda, or unsweetened iced tea.  Regular soda, juice, and other mixers might contain a lot of carbohydrates and should be counted. WHAT FOODS ARE NOT RECOMMENDED? As you make food choices, it is important to remember that all foods are not the same. Some foods have fewer nutrients per serving than other  foods, even though they might have the same number of calories or carbohydrates. It is difficult to get your body what it needs when you eat foods with fewer nutrients. Examples of foods that you should avoid that are high in calories and carbohydrates but low in nutrients include:  Trans fats (most processed foods list trans fats on the Nutrition Facts label).  Regular soda.  Juice.  Candy.  Sweets, such as cake, pie, doughnuts, and cookies.  Fried foods. WHAT FOODS CAN I EAT? Have nutrient-rich foods, which will nourish your body and keep you healthy. The food you should eat also will depend on several factors, including:  The calories you need.  The medicines you take.  Your weight.  Your blood glucose level.  Your blood pressure level.  Your cholesterol level. You also should eat a variety of foods, including:  Protein, such as meat, poultry, fish, tofu, nuts, and seeds (lean animal proteins are best).  Fruits.  Vegetables.  Dairy products, such as milk, cheese, and yogurt (low fat is best).  Breads, grains, pasta, cereal, rice, and beans.  Fats such as olive oil, trans fat-free margarine, canola oil, avocado, and olives. DOES EVERYONE WITH DIABETES MELLITUS HAVE THE SAME MEAL PLAN? Because every person with diabetes mellitus is different, there is not one meal plan that works for everyone. It is very important that you meet with a dietitian who will help you create a meal plan that is just right for you. Document Released: 12/09/2004 Document Revised: 03/19/2013 Document Reviewed: 02/08/2013 ExitCare Patient Information 2015 [employer]. This   information is not intended to replace advice given to you by your health care provider. Make sure you discuss any questions you have with your health care provider.  

## 2014-05-06 NOTE — Progress Notes (Signed)
   Subjective:    Patient ID: Terry [name], male    DOB: 12/31/1960, 54 y.o.   MRN: [id number]  Diabetes He presents for his follow-up diabetic visit. He has type 2 diabetes mellitus. Pertinent negatives for hypoglycemia include no confusion or tremors. There are no diabetic associated symptoms. Pertinent negatives for diabetes include no chest pain, no fatigue, no polydipsia, no polyphagia and no weakness. Current diabetic treatment includes insulin injections and oral agent (monotherapy). He is compliant with treatment all of the time. He monitors blood glucose at home 5+ x per day. His highest blood glucose is 180-200 mg/dl. He does not see a podiatrist.Eye exam is not current.   Long discussion held regarding how the patient is doing we talked at length about the importance of diet regular physical activity in getting his sugars under better control by adjusting his insulin.  Also discussion held regarding the importance of keeping cholesterol under control. The importance of regular eye checkup also patient denies any chest tightness or shortness of breath with activity he does relate a lot of joint pain in his hands as well as knees and his back. Works with his hands a lot.   Review of Systems  Constitutional: Negative for activity change, appetite change and fatigue.  HENT: Negative for congestion.   Respiratory: Negative for cough.   Cardiovascular: Negative for chest pain.  Gastrointestinal: Negative for abdominal pain.  Endocrine: Negative for polydipsia and polyphagia.  Neurological: Negative for tremors and weakness.  Psychiatric/Behavioral: Negative for confusion.       Objective:   Physical Exam  Constitutional: He appears well-nourished. No distress.  Cardiovascular: Normal rate, regular rhythm and normal heart sounds.   No murmur heard. Pulmonary/Chest: Effort normal and breath sounds normal. No respiratory distress.  Musculoskeletal: He exhibits no edema.    Lymphadenopathy:    He has no cervical adenopathy.  Neurological: He is alert.  Psychiatric: His behavior is normal.  Vitals reviewed.         Assessment & Plan:  The patient was seen today as part of a comprehensive diabetic check up. The patient had the following elements completed: -Review of medication compliance -Review of glucose monitoring results -Review of any complications do to high or low sugars -Diabetic foot exam was completed as part of today's visit. The following was also discussed: -Importance of yearly eye exams -Importance of following diabetic/low sugar-starch diet -Importance of exercise and regular activity -Importance of regular followup visits. -Most recent hemoglobin A1c were reviewed with the patient along with goals regarding diabetes.  Chronic back pain and knee pain-prescription for oxycodone given he does not abuse it. He is to follow-up in 3 months  He is a send [place] regular readings in 2-3 weeks May need to adjust insulin more He will use insulin as prescribed. Increase the amount of long-acting insulin to 65 units twice daily He would do 30 units at breakfast and lunch and 35 units at supper all wall with a sliding scale. He will send [place] his readings. 25 minutes spent with patient

## 2014-05-26 ENCOUNTER — Other Ambulatory Visit: Payer: Self-pay | Admitting: Family Medicine

## 2014-06-25 ENCOUNTER — Other Ambulatory Visit: Payer: Self-pay | Admitting: Family Medicine

## 2014-07-29 ENCOUNTER — Telehealth: Payer: Self-pay | Admitting: Family Medicine

## 2014-07-29 DIAGNOSIS — E119 Type 2 diabetes mellitus without complications: Secondary | ICD-10-CM

## 2014-07-29 DIAGNOSIS — E785 Hyperlipidemia, unspecified: Secondary | ICD-10-CM

## 2014-07-29 NOTE — Telephone Encounter (Signed)
Lipid profile, urine micro-protein, A1c

## 2014-07-29 NOTE — Telephone Encounter (Signed)
Pt needs blood work orders   Last labs  02/25/14 Lip, Hep, A1C, BMP, PSA   Call when ready

## 2014-07-30 NOTE — Telephone Encounter (Signed)
Left message on voicemail notifying patient that blood work has been ordered.  

## 2014-08-01 ENCOUNTER — Ambulatory Visit (INDEPENDENT_AMBULATORY_CARE_PROVIDER_SITE_OTHER): Payer: BLUE CROSS/BLUE SHIELD | Admitting: Family Medicine

## 2014-08-01 ENCOUNTER — Encounter: Payer: Self-pay | Admitting: Family Medicine

## 2014-08-01 VITALS — BP 162/94 | Ht 70.0 in | Wt 201.0 lb

## 2014-08-01 DIAGNOSIS — M25551 Pain in right hip: Secondary | ICD-10-CM

## 2014-08-01 DIAGNOSIS — G8929 Other chronic pain: Secondary | ICD-10-CM

## 2014-08-01 DIAGNOSIS — M25572 Pain in left ankle and joints of left foot: Secondary | ICD-10-CM

## 2014-08-01 DIAGNOSIS — E119 Type 2 diabetes mellitus without complications: Secondary | ICD-10-CM | POA: Diagnosis not present

## 2014-08-01 DIAGNOSIS — E785 Hyperlipidemia, unspecified: Secondary | ICD-10-CM | POA: Diagnosis not present

## 2014-08-01 DIAGNOSIS — I10 Essential (primary) hypertension: Secondary | ICD-10-CM

## 2014-08-01 LAB — POCT GLYCOSYLATED HEMOGLOBIN (HGB A1C): HEMOGLOBIN A1C: 8.3

## 2014-08-01 MED ORDER — OXYCODONE-ACETAMINOPHEN 10-325 MG PO TABS: ORAL_TABLET | ORAL | Status: DC

## 2014-08-01 MED ORDER — AMLODIPINE BESYLATE 10 MG PO TABS: ORAL_TABLET | ORAL | Status: DC

## 2014-08-01 NOTE — Progress Notes (Signed)
   Subjective:    Patient ID: Terry [name], male    DOB: 10-25-1960, 54 y.o.   MRN: [id number]  Diabetes He presents for his follow-up diabetic visit. He has type 2 diabetes mellitus. Pertinent negatives for hypoglycemia include no confusion. Pertinent negatives for diabetes include no chest pain, no fatigue, no polydipsia, no polyphagia and no weakness. Current diabetic treatment includes insulin injections. His breakfast blood glucose range is generally 140-180 mg/dl. He does not see a podiatrist.Eye exam is not current (does not have eye dr).  pt stopped metformin because it was causing major stomach issues.   Right hip pain. Ongoing for a while getting worse. Causing him to limp some.  The importance of taking his cholesterol medicine blood pressure medicine discuss as well as healthy eating. Regular physical activity.   Review of Systems  Constitutional: Negative for activity change, appetite change and fatigue.  HENT: Negative for congestion.   Respiratory: Negative for cough.   Cardiovascular: Negative for chest pain.  Gastrointestinal: Negative for abdominal pain.  Endocrine: Negative for polydipsia and polyphagia.  Neurological: Negative for weakness.  Psychiatric/Behavioral: Negative for confusion.       Objective:   Physical Exam  Constitutional: He appears well-nourished. No distress.  Cardiovascular: Normal rate, regular rhythm and normal heart sounds.   No murmur heard. Pulmonary/Chest: Effort normal and breath sounds normal. No respiratory distress.  Musculoskeletal: He exhibits no edema.  Lymphadenopathy:    He has no cervical adenopathy.  Neurological: He is alert.  Psychiatric: His behavior is normal.  Vitals reviewed.   He has decreased range of motion in both the right hip and the left ankle      Assessment & Plan:  1. Type 2 diabetes mellitus without complication Subpar diabetes control adjust insulin try to keep morning sugars in the 100-120 range  pre-meal less than 150. Patient currently is skipping short acting insulin near mealtime I advised him that it's in his best interest to do the short acting insulin at meals with adjustment as necessary for high sugars information given - POCT glycosylated hemoglobin (Hb A1C) - Microalbumin, urine  2. Essential hypertension, benign Blood pressure needs to add additional medication increases strengthen the medicine amlodipine 10 mg the goal is get blood pressure 130/80  3. Hyperlipidemia Healthy diet taking his medication discuss check lipid profile - Lipid panel  4. Chronic pain of left ankle Severe chronic pain of the left ankle Percocet when necessary maximum 3 times a day  5. Right hip pain Hip pain to do x-ray await the results - DG HIP UNILAT WITH PELVIS 2-3 VIEWS RIGHT  25 minutes spent with patient discussing these multiple issues  Patient encouraged to get his eye exam

## 2014-08-01 NOTE — Patient Instructions (Signed)
Dear Patient,  It has been recommended to you that you have a colonoscopy. It is your responsibility to carry through with this recommendation.   Did you realize that colon cancer is the second leading cancer killer in the [place]. One in every 20 adults will get colon cancer. If all adults would go through the recommended screening for colon cancer (getting a colonoscopy), then there would be a 60% reduction in the number of people dying from colon cancer.  Colon cancer just doesn't come out of the blue. It starts off as a small polyp which over time grows into a cancer. A colonoscopy can prevent cancer and in many cases detected when it is at a very treatable phase. Small colon cancers can have cure rates of 95%. Advanced colon cancer, which often occurs in people who do not do their screenings, have cure rates less than 20%. The risk of colon cancer advances with age. Most adults should have regular colonoscopies every 10 years starting at age 50. This recommendation can vary depending on a person's medical history.  Health-care laws now allow for you to call the gastroenterologist office directly in order to set yourself up for this very important tests. Today we have recommended to you that you do this test. This test may save your life. Failure to do this test puts you at risk for premature death from colon cancer. Do the right thing and schedule this test now.  Here as a list of specialists we recommend in the surrounding area. When you call their office let them know that you are a patient of our practice in your interested in doing a screening colonoscopy. They should assist you without problems. You will need the following information when you called them: 1-name of which Dr. you see, 2-your insurance information, 3-a list of medications that you currently take, 4-any allergies you have to medications.  [place] gastroenterologist Dr. Mike Rourk, Dr Sandi Fields   [place]  gastroenterologist   [phone]  Dr.Najeeb Rehman [facility]   [phone]   gastroenterology [facility] (Dr. Perry, N, Stark, Brodie, Gesner, Jacobs and Pyrtle) [phone]  [facility] (Dr. Buscemi, Edwards, Hayes, [place],Outlaw,Schooler) [phone]  Each group of specialists has assured us that when you called them they will help you get your colonoscopy set up. Should you have problems please let us know. Be sure to call soon. Sincerely, Carolyn Hoskins, Dr Steve Hammad Finkler, Dr.Karlei Waldo    

## 2014-08-02 LAB — LIPID PANEL
Chol/HDL Ratio: 4.3 ratio units (ref 0.0–5.0)
Cholesterol, Total: 156 mg/dL (ref 100–199)
HDL: 36 mg/dL — ABNORMAL LOW (ref >39)
LDL CALC: 93 mg/dL (ref 0–99)
Triglycerides: 137 mg/dL (ref 0–149)
VLDL Cholesterol Cal: 27 mg/dL (ref 5–40)

## 2014-08-02 LAB — MICROALBUMIN, URINE: Microalbumin, Urine: 194.6 ug/mL — ABNORMAL HIGH (ref 0.0–17.0)

## 2014-09-02 ENCOUNTER — Other Ambulatory Visit: Payer: Self-pay | Admitting: Family Medicine

## 2014-09-12 ENCOUNTER — Telehealth: Payer: Self-pay | Admitting: Family Medicine

## 2014-09-12 MED ORDER — INSULIN LISPRO 100 UNIT/ML (KWIKPEN): PEN_INJECTOR | SUBCUTANEOUS | Status: DC

## 2014-09-12 NOTE — Telephone Encounter (Signed)
Ok may switvh

## 2014-09-12 NOTE — Telephone Encounter (Signed)
Call into [facility]

## 2014-09-12 NOTE — Telephone Encounter (Signed)
Wife called stating patient wants to be switched by to the humalog 100 units instead of the novolog . His stomach cant tolerate the medication and leaving this weekend for vacation.

## 2014-09-12 NOTE — Telephone Encounter (Signed)
Notified [name] that Humalog was sent to pharmacy. Novolog discontinued on med list.

## 2014-09-17 ENCOUNTER — Telehealth: Payer: Self-pay | Admitting: Family Medicine

## 2014-09-17 NOTE — Telephone Encounter (Signed)
Rx prior auth APPROVED for pt's insulin lispro (HUMALOG KWIKPEN) 100 UNIT/ML KiwkPen, prior auth good 09/17/2014-03/27/2038, approved through cover my meds, faxed approval to [facility] pharmacy

## 2014-09-19 ENCOUNTER — Other Ambulatory Visit: Payer: Self-pay | Admitting: Family Medicine

## 2014-09-23 ENCOUNTER — Telehealth: Payer: Self-pay | Admitting: Family Medicine

## 2014-09-23 MED ORDER — INSULIN LISPRO 200 UNIT/ML ~~LOC~~ SOPN: 140.0000 [IU] | PEN_INJECTOR | Freq: Every day | SUBCUTANEOUS | Status: DC

## 2014-09-23 NOTE — Telephone Encounter (Signed)
Left message on voicemail notifying patient that med was sent to pharmacy.

## 2014-09-23 NOTE — Telephone Encounter (Signed)
We ordered and obtained prior auth for pt's insulin lispro (HUMALOG KWIKPEN) 100 UNIT/ML KiwkPen, the copay for this is $70.00  Patient has a Rx coupon savings card that allows his to get a 200U Humalog kwikpen for only $25.00  Patient would like an order sent to [facility] for the 200U Humalog kwikpen so he will be able to use the coupon savings card. (with only one income, pt is trying to get meds as cheap as he can)  Please advise and call when done

## 2014-09-23 NOTE — Telephone Encounter (Signed)
This is a "[employer], I get them in about every other room these days, let's do

## 2014-10-01 ENCOUNTER — Other Ambulatory Visit: Payer: Self-pay | Admitting: Family Medicine

## 2014-10-30 ENCOUNTER — Ambulatory Visit: Payer: BLUE CROSS/BLUE SHIELD | Admitting: Family Medicine

## 2014-12-02 ENCOUNTER — Other Ambulatory Visit: Payer: Self-pay | Admitting: Family Medicine

## 2014-12-19 ENCOUNTER — Encounter: Payer: Self-pay | Admitting: Family Medicine

## 2014-12-19 ENCOUNTER — Ambulatory Visit (INDEPENDENT_AMBULATORY_CARE_PROVIDER_SITE_OTHER): Payer: BLUE CROSS/BLUE SHIELD | Admitting: Family Medicine

## 2014-12-19 VITALS — BP 156/88 | Ht 70.0 in | Wt 203.0 lb

## 2014-12-19 DIAGNOSIS — Z79899 Other long term (current) drug therapy: Secondary | ICD-10-CM | POA: Diagnosis not present

## 2014-12-19 DIAGNOSIS — E785 Hyperlipidemia, unspecified: Secondary | ICD-10-CM

## 2014-12-19 DIAGNOSIS — T148 Other injury of unspecified body region: Secondary | ICD-10-CM | POA: Diagnosis not present

## 2014-12-19 DIAGNOSIS — Z23 Encounter for immunization: Secondary | ICD-10-CM

## 2014-12-19 DIAGNOSIS — I10 Essential (primary) hypertension: Secondary | ICD-10-CM

## 2014-12-19 DIAGNOSIS — T148XXA Other injury of unspecified body region, initial encounter: Secondary | ICD-10-CM

## 2014-12-19 DIAGNOSIS — E119 Type 2 diabetes mellitus without complications: Secondary | ICD-10-CM

## 2014-12-19 LAB — POCT GLYCOSYLATED HEMOGLOBIN (HGB A1C): HEMOGLOBIN A1C: 7.8

## 2014-12-19 MED ORDER — SERTRALINE HCL 50 MG PO TABS: 50.0000 mg | ORAL_TABLET | Freq: Every day | ORAL | Status: DC

## 2014-12-19 MED ORDER — INSULIN GLARGINE 100 UNIT/ML SOLOSTAR PEN: PEN_INJECTOR | SUBCUTANEOUS | Status: DC

## 2014-12-19 MED ORDER — OXYCODONE-ACETAMINOPHEN 10-325 MG PO TABS: ORAL_TABLET | ORAL | Status: DC

## 2014-12-19 NOTE — Progress Notes (Signed)
Subjective:    Patient ID: Terry [name], male    DOB: 1960/06/30, 54 y.o.   MRN: [id number]  Diabetes He presents for his follow-up diabetic visit. He has type 2 diabetes mellitus. Pertinent negatives for hypoglycemia include no confusion. Pertinent negatives for diabetes include no chest pain, no fatigue, no polydipsia, no polyphagia and no weakness. Current diabetic treatment includes insulin injections. Diabetic current diet: tries to follow diabetic diet. Exercise: no exercise but active job. He does not see a podiatrist.Eye exam is not current.  A1C 7.8.  Pt states his lantus is not lasting 30 days and he needs to have a new rx so it will last. Takes 77- 78 BID. He relates he does have 2 or 3 low spells per week but this is typically when he does not eat. Pt wants to discuss stress. Pt states he has been under a lot of stress lately. Patient denies being depressed but he finds himself on edge a lot irritable difficult time concentrating and focusing. Declines flu vaccine today wants to wait until later in the season to get.  Patient agrees to get his lab work completed. He will do this 3 months from now. He takes his blood pressure medicine on regular basis watch his salt in his diet try to stay active Does use pain medication anywhere between 1-3 times per day to help him with pain and discomfort Does take his diabetes medicine on a regular basis.   Review of Systems  Constitutional: Negative for activity change, appetite change and fatigue.  HENT: Negative for congestion.   Respiratory: Negative for cough.   Cardiovascular: Negative for chest pain.  Gastrointestinal: Negative for abdominal pain.  Endocrine: Negative for polydipsia and polyphagia.  Neurological: Negative for weakness.  Psychiatric/Behavioral: Negative for confusion.       Objective:   Physical Exam  Constitutional: He appears well-nourished. No distress.  Cardiovascular: Normal rate, regular rhythm and  normal heart sounds.   No murmur heard. Pulmonary/Chest: Effort normal and breath sounds normal. No respiratory distress.  Musculoskeletal: He exhibits no edema.  Lymphadenopathy:    He has no cervical adenopathy.  Neurological: He is alert.  Psychiatric: His behavior is normal.  Vitals reviewed.   Diabetic foot exam was completed does have some slight distress loss of sensation in the great toe left side      Assessment & Plan:  1. Type 2 diabetes mellitus without complication Diabetes better control the what it was. Continue current measures repeat this again in 3 months time follow-up in early January - POCT glycosylated hemoglobin (Hb A1C) - Hemoglobin A1c - Microalbumin / creatinine urine ratio  2. Essential hypertension, benign Blood pressure wife is going to check blood pressure several times per week over the next few weeks then send those readings to [place] his readings are higher today he thinks is related to stress may need adjustments in medicines  3. Hyperlipidemia Hyperlipidemia continue current measures watch diet check lab work again in 3-4 months - Lipid panel  4. High risk medication use Due to medication check lab work 3-4 months - Hepatic function panel - Basic metabolic panel  5. Abrasion Small abrasion occurred 2 days ago needs tetanus shot. - Td vaccine greater than or equal to 7yo preservative free IM Stress related issues start Zoloft daily start off half tablet daily then advanced 1 tablet a day should see significant improvement in moods over the next few weeks follow-up sooner problems warning signs regarding depression discussed  I don't feel patient depressed patient not suicidal or homicidal  Greater than 25 minutes spent this patient discussing multiple different issues answering multiple questions and taking into account multiple diagnosis and treatment then use

## 2014-12-19 NOTE — Patient Instructions (Signed)
Aspirin and Your Heart Aspirin affects the way your blood clots and helps "thin" the blood. Aspirin has many uses in heart disease. It may be used as a primary prevention to help reduce the risk of heart related events. It also can be used as a secondary measure to prevent more heart attacks or to prevent additional damage from blood clots.  ASPIRIN MAY HELP IF YOU:  Have had a heart attack or chest pain.  Have undergone open heart surgery such as CABG (Coronary Artery Bypass Surgery).  Have had coronary angioplasty with or without stents.  Have experienced a stroke or TIA (transient ischemic attack).  Have peripheral vascular disease (PAD).  Have chronic heart rhythm problems such as atrial fibrillation.  Are at risk for heart disease. BEFORE STARTING ASPIRIN Before you start taking aspirin, your caregiver will need to review your medical history. Many things will need to be taken into consideration, such as:  Smoking status.  Blood pressure.  Diabetes.  Gender.  Weight.  Cholesterol level. ASPIRIN DOSES  Aspirin should only be taken on the advice of your caregiver. Talk to your caregiver about how much aspirin you should take. Aspirin comes in different doses such as:  81 mg.  162 mg.  325 mg.  The aspirin dose you take may be affected by many factors, some of which include:  Your current medications, especially if your are taking blood-thinners or anti-platelet medicine.  Liver function.  Heart disease risk.  Age.  Aspirin comes in two forms:  Non-enteric-coated. This type of aspirin does not have a coating and is absorbed faster. Non-enteric coated aspirin is recommended for patients experiencing chest pain symptoms. This type of aspirin also comes in a chewable form.  Enteric-coated. This means the aspirin has a special coating that releases the medicine very slowly. Enteric-coated aspirin causes less stomach upset. This type of aspirin should not be chewed  or crushed. ASPIRIN SIDE EFFECTS Daily use of aspirin can increase your risk of serious side effects. Some of these include:  Increased bleeding. This can range from a cut that does not stop bleeding to more serious problems such as stomach bleeding or bleeding into the brain (Intracerebral bleeding).  Increased bruising.  Stomach upset.  An allergic reaction such as red, itchy skin.  Increased risk of bleeding when combined with non-steroidal anti-inflammatory medicine (NSAIDS).  Alcohol should be drank in moderation when taking aspirin. Alcohol can increase the risk of stomach bleeding when taken with aspirin.  Aspirin should not be given to children less than 18 years of age due to the association of Reye syndrome. Reye syndrome is a serious illness that can affect the brain and liver. Studies have linked Reye syndrome with aspirin use in children.  People that have nasal polyps have an increased risk of developing an aspirin allergy. SEEK MEDICAL CARE IF:   You develop an allergic reaction such as:  Hives.  Itchy skin.  Swelling of the lips, tongue or face.  You develop stomach pain.  You have unusual bleeding or bruising.  You have ringing in your ears. SEEK IMMEDIATE MEDICAL CARE IF:   You have severe chest pain, especially if the pain is crushing or pressure-like and spreads to the arms, back, neck, or jaw. THIS IS AN EMERGENCY. Do not wait to see if the pain will go away. Get medical help at once. Call your local emergency services (911 in the [place] DO NOT drive yourself to the hospital.  You have stroke-like symptoms   such as:  Loss of vision.  Difficulty talking.  Numbness or weakness on one side of your body.  Numbness or weakness in your arm or leg.  Not thinking clearly or feeling confused.  Your bowel movements are bloody, dark red or black in color.  You vomit or cough up blood.  You have blood in your urine.  You have shortness of breath,  coughing or wheezing. MAKE SURE YOU:   Understand these instructions.  Will monitor your condition.  Seek immediate medical care if necessary. Document Released: 02/25/2008 Document Revised: 07/09/2012 Document Reviewed: 06/19/2013 ExitCare Patient Information 2015 [employer]. This information is not intended to replace advice given to you by your health care provider. Make sure you discuss any questions you have with your health care provider.  

## 2014-12-29 ENCOUNTER — Other Ambulatory Visit: Payer: Self-pay | Admitting: Family Medicine

## 2015-01-05 ENCOUNTER — Other Ambulatory Visit: Payer: Self-pay | Admitting: *Deleted

## 2015-01-05 MED ORDER — INSULIN GLARGINE 100 UNIT/ML SOLOSTAR PEN: PEN_INJECTOR | SUBCUTANEOUS | Status: DC

## 2015-01-07 ENCOUNTER — Telehealth: Payer: Self-pay | Admitting: Family Medicine

## 2015-01-07 NOTE — Telephone Encounter (Signed)
44ml was sent on 10/10. Called pharm to verify they have script. Pharm does have script for 46ml.

## 2015-01-07 NOTE — Telephone Encounter (Signed)
pts lantus  [facility] is saying they are Giving him 30 ml, he keeps running out.  He needs the 60 ml box, which is what the was ordered from our office  Can we resend this or correct this script to where the patient is getting the  Correct dose please.

## 2015-01-07 NOTE — Telephone Encounter (Signed)
Pt.notified

## 2015-02-24 ENCOUNTER — Telehealth: Payer: Self-pay | Admitting: Family Medicine

## 2015-02-24 NOTE — Telephone Encounter (Signed)
Requesting Rx for oxyCODONE-acetaminophen (PERCOCET) 10-325 MG per tablet.  Last filled on 12/19/2014.

## 2015-02-25 ENCOUNTER — Other Ambulatory Visit: Payer: Self-pay | Admitting: *Deleted

## 2015-02-25 MED ORDER — OXYCODONE-ACETAMINOPHEN 10-325 MG PO TABS: ORAL_TABLET | ORAL | Status: DC

## 2015-02-25 NOTE — Telephone Encounter (Signed)
Script ready for pickup. Pt notified.  

## 2015-02-25 NOTE — Telephone Encounter (Signed)
Patient may have a prescription for 90 tablets one 3 times a day when necessary, patient will need follow-up office visit before further prescriptions based on federal and state guidelines

## 2015-03-06 ENCOUNTER — Other Ambulatory Visit: Payer: Self-pay | Admitting: Family Medicine

## 2015-04-02 ENCOUNTER — Encounter: Payer: Self-pay | Admitting: Family Medicine

## 2015-04-02 ENCOUNTER — Ambulatory Visit (INDEPENDENT_AMBULATORY_CARE_PROVIDER_SITE_OTHER): Payer: BLUE CROSS/BLUE SHIELD | Admitting: Family Medicine

## 2015-04-02 VITALS — BP 132/88 | Ht 70.0 in | Wt 206.0 lb

## 2015-04-02 DIAGNOSIS — E119 Type 2 diabetes mellitus without complications: Secondary | ICD-10-CM

## 2015-04-02 DIAGNOSIS — E785 Hyperlipidemia, unspecified: Secondary | ICD-10-CM

## 2015-04-02 DIAGNOSIS — I10 Essential (primary) hypertension: Secondary | ICD-10-CM | POA: Diagnosis not present

## 2015-04-02 DIAGNOSIS — G8929 Other chronic pain: Secondary | ICD-10-CM | POA: Diagnosis not present

## 2015-04-02 DIAGNOSIS — M25572 Pain in left ankle and joints of left foot: Secondary | ICD-10-CM | POA: Diagnosis not present

## 2015-04-02 LAB — POCT GLYCOSYLATED HEMOGLOBIN (HGB A1C): HEMOGLOBIN A1C: 9.4

## 2015-04-02 MED ORDER — OXYCODONE-ACETAMINOPHEN 10-325 MG PO TABS: ORAL_TABLET | ORAL | Status: DC

## 2015-04-02 MED ORDER — INSULIN GLARGINE 100 UNIT/ML SOLOSTAR PEN: PEN_INJECTOR | SUBCUTANEOUS | Status: DC

## 2015-04-02 MED ORDER — ATORVASTATIN CALCIUM 80 MG PO TABS: ORAL_TABLET | ORAL | Status: DC

## 2015-04-02 MED ORDER — LIRAGLUTIDE 18 MG/3ML ~~LOC~~ SOPN: PEN_INJECTOR | SUBCUTANEOUS | Status: DC

## 2015-04-02 MED ORDER — LOSARTAN POTASSIUM-HCTZ 100-12.5 MG PO TABS: 1.0000 | ORAL_TABLET | Freq: Every day | ORAL | Status: DC

## 2015-04-02 MED ORDER — AMLODIPINE BESYLATE 10 MG PO TABS: ORAL_TABLET | ORAL | Status: DC

## 2015-04-02 NOTE — Progress Notes (Signed)
Subjective:    Patient ID: Terry [name], male    DOB: [birth year], 55 y.o.   MRN: [id number]  Diabetes He presents for his follow-up diabetic visit. He has type 2 diabetes mellitus. Pertinent negatives for hypoglycemia include no headaches. Associated symptoms include polyuria. Pertinent negatives for diabetes include no chest pain and no weakness. Exercise: active job. works [employer]. His overall blood glucose range is 180-200 mg/dl. He does not see a podiatrist.Eye exam is current (upcoming appt. jan 20th).   Patient states that he has not been doing a good job watching his diet. In addition to this he often does not adjust his insulin. He is on a large amount of insulin along with Victoza. Patient did not tolerate Invokana  Wants to discuss anxiety. Was taking zoloft but quit after 2 days. This patient states that he has a lot of stress feels anxious at times wonders if he does have nerve pills to take when he feels anxious. It should be noted that he has also on pain medicine. Patient does not want to take an antidepressant because he states he doesn't want to take one additional medicine every single day.   Having trouble with bp staying up. The patient does relate that his blood pressure seems to be more elevated than what has been often ranging in the 150/90 range. He does try to be somewhat healthy with his eating.  This patient was seen today for chronic pain  The medication list was reviewed and updated.   -Compliance with medication: He states he is compliant with medicine  - Number patient states they take daily: 3 daily  -when was the last dose patient took? Earlier today  The patient was advised the importance of maintaining medication and not using illegal substances with these.  Refills needed: Needs 3 scripts  The patient was educated that we can provide 3 monthly scripts for their medication, it is their responsibility to follow the instructions.  Side effects or  complications from medications: Denies any side effects  Patient is aware that pain medications are meant to minimize the severity of the pain to allow their pain levels to improve to allow for better function. They are aware of that pain medications cannot totally remove their pain.  Due for UDT ( at least once per year) : Will be due again later this year       Review of Systems  Constitutional: Negative for fever, activity change and appetite change.  HENT: Negative for congestion and rhinorrhea.   Eyes: Negative for discharge.  Respiratory: Negative for cough and wheezing.   Cardiovascular: Negative for chest pain.  Gastrointestinal: Negative for vomiting, abdominal pain and blood in stool.  Endocrine: Positive for polyuria.  Genitourinary: Negative for frequency and difficulty urinating.  Musculoskeletal: Negative for neck pain.  Skin: Negative for rash.  Allergic/Immunologic: Negative for environmental allergies and food allergies.  Neurological: Negative for weakness and headaches.  Psychiatric/Behavioral: Negative for agitation.       Objective:   Physical Exam  Constitutional: He appears well-nourished. No distress.  Cardiovascular: Normal rate, regular rhythm and normal heart sounds.   No murmur heard. Pulmonary/Chest: Effort normal and breath sounds normal. No respiratory distress.  Musculoskeletal: He exhibits no edema.  Lymphadenopathy:    He has no cervical adenopathy.  Neurological: He is alert.  Psychiatric: His behavior is normal.  Vitals reviewed.  25 minutes was spent with the patient. Greater than half the time was spent in discussion and  answering questions and counseling regarding the issues that the patient came in for today.       Assessment & Plan:  1. Type 2 diabetes mellitus without complication, unspecified long term insulin use status ([place]) I stressed to the patient the importance of doing regular glucose checks as well as adjusting his  Lantus in order to keep his glucose under good control I also stressed to him the importance of eating healthy and stay physically active. He will send [place] readings at least every 2 weeks he will follow-up again in 3-4 weeks to recheck A1c he is aware of that his diabetes is not under good control - POCT glycosylated hemoglobin (Hb A1C)  2. Essential hypertension, benign Blood pressure subpar control he needs to improve his diet in addition to this I recommend adding HCTZ to his losartan. I recommend following up again in 3-4 months time to see how he is doing  3. DM type 2 without retinopathy ([place]) As per above  4. Hyperlipidemia Importance of keeping cholesterol under good control was discussed. Continue Lipitor. Will need lab work later this spring  5. Chronic pain of left ankle Chronic pain and discomfort The patient was seen today as part of a comprehensive visit regarding pain control. Patient's compliance with the medication as well as discussion regarding effectiveness was completed. Prescriptions were written. Patient was advised to follow-up in 3 months. The patient was assessed for any signs of severe side effects. The patient was advised to take the medicine as directed and to report to [place] if any side effect issues.  Generalized anxiety-I recommended the patient use a SSRI in order to help him with that anxiousness he does not want to do this. I told the patient in good consciousness I could not prescribe nerve medication because it could increase her risk of accidental overdose.

## 2015-04-02 NOTE — Patient Instructions (Signed)
Dear Patient,  It has been recommended to you that you have a colonoscopy. It is your responsibility to carry through with this recommendation.   Did you realize that colon cancer is the second leading cancer killer in the [place]. One in every 20 adults will get colon cancer. If all adults would go through the recommended screening for colon cancer (getting a colonoscopy), then there would be a 60% reduction in the number of people dying from colon cancer.  Colon cancer just doesn't come out of the blue. It starts off as a small polyp which over time grows into a cancer. A colonoscopy can prevent cancer and in many cases detected when it is at a very treatable phase. Small colon cancers can have cure rates of 95%. Advanced colon cancer, which often occurs in people who do not do their screenings, have cure rates less than 20%. The risk of colon cancer advances with age. Most adults should have regular colonoscopies every 10 years starting at age 50. This recommendation can vary depending on a person's medical history.  Health-care laws now allow for you to call the gastroenterologist office directly in order to set yourself up for this very important tests. Today we have recommended to you that you do this test. This test may save your life. Failure to do this test puts you at risk for premature death from colon cancer. Do the right thing and schedule this test now.  Here as a list of specialists we recommend in the surrounding area. When you call their office let them know that you are a patient of our practice in your interested in doing a screening colonoscopy. They should assist you without problems. You will need the following information when you called them: 1-name of which Dr. you see, 2-your insurance information, 3-a list of medications that you currently take, 4-any allergies you have to medications.  [place] gastroenterologist Dr. Mike Rourk, Dr Sandi Fields   [place]  gastroenterologist   [phone]  Dr.Najeeb Rehman [facility]   [phone]   gastroenterology [facility] (Dr. Perry, N, Stark, Brodie, Gesner, Jacobs and Pyrtle) [phone]  [facility] (Dr. Buscemi, Edwards, Hayes, [place],Outlaw,Schooler) [phone]  Each group of specialists has assured us that when you called them they will help you get your colonoscopy set up. Should you have problems or if the GI practice insist a referral be done please let us know. Be sure to call soon. Sincerely, Carolyn Hoskins, Dr Steve Luking, Dr.Scott Luking    

## 2015-04-16 ENCOUNTER — Other Ambulatory Visit: Payer: Self-pay | Admitting: Family Medicine

## 2015-04-18 LAB — HM DIABETES EYE EXAM

## 2015-04-22 ENCOUNTER — Encounter: Payer: Self-pay | Admitting: *Deleted

## 2015-05-07 ENCOUNTER — Telehealth: Payer: Self-pay | Admitting: Family Medicine

## 2015-05-07 NOTE — Telephone Encounter (Signed)
Pt dropped off his blood pressure readings. Message in box.

## 2015-05-07 NOTE — Telephone Encounter (Signed)
Overall glucose readings look good is best as possible avoid low sugar spells. If having frequent low sugar spells we may need to back down on the insulin. Keep follow-up visit in early April. Send [place] some readings in several weeks.

## 2015-05-08 NOTE — Telephone Encounter (Signed)
Spoke with patient and informed her per Dr.Scott Luking-Overall glucose readings look good is best as possible avoid low sugar spells. If having frequent low sugar spells we may need to back down on the insulin. Keep follow-up visit in early April. Send [place] some readings in several weeks. Patient verbalized understanding.

## 2015-05-08 NOTE — Telephone Encounter (Signed)
[facility] 05/08/15

## 2015-05-18 ENCOUNTER — Other Ambulatory Visit: Payer: Self-pay | Admitting: Family Medicine

## 2015-06-08 ENCOUNTER — Other Ambulatory Visit: Payer: Self-pay | Admitting: Family Medicine

## 2015-06-20 ENCOUNTER — Other Ambulatory Visit: Payer: Self-pay | Admitting: Family Medicine

## 2015-06-22 LAB — HEPATIC FUNCTION PANEL
ALK PHOS: 57 IU/L (ref 39–117)
ALT: 42 IU/L (ref 0–44)
AST: 30 IU/L (ref 0–40)
Albumin: 4.5 g/dL (ref 3.5–5.5)
Bilirubin Total: 0.4 mg/dL (ref 0.0–1.2)
Bilirubin, Direct: 0.1 mg/dL (ref 0.00–0.40)
TOTAL PROTEIN: 7.2 g/dL (ref 6.0–8.5)

## 2015-06-22 LAB — BASIC METABOLIC PANEL
BUN / CREAT RATIO: 12 (ref 9–20)
BUN: 12 mg/dL (ref 6–24)
CALCIUM: 9.4 mg/dL (ref 8.7–10.2)
CHLORIDE: 98 mmol/L (ref 96–106)
CO2: 26 mmol/L (ref 18–29)
Creatinine, Ser: 0.98 mg/dL (ref 0.76–1.27)
GFR, EST AFRICAN AMERICAN: 101 mL/min/{1.73_m2} (ref >59)
GFR, EST NON AFRICAN AMERICAN: 88 mL/min/{1.73_m2} (ref >59)
Glucose: 78 mg/dL (ref 65–99)
POTASSIUM: 4.1 mmol/L (ref 3.5–5.2)
SODIUM: 142 mmol/L (ref 134–144)

## 2015-06-22 LAB — HEMOGLOBIN A1C
ESTIMATED AVERAGE GLUCOSE: 217 mg/dL
HEMOGLOBIN A1C: 9.2 % — AB (ref 4.8–5.6)

## 2015-06-22 LAB — LIPID PANEL
Chol/HDL Ratio: 5.7 ratio units — ABNORMAL HIGH (ref 0.0–5.0)
Cholesterol, Total: 149 mg/dL (ref 100–199)
HDL: 26 mg/dL — AB (ref >39)
LDL CALC: 59 mg/dL (ref 0–99)
Triglycerides: 319 mg/dL — ABNORMAL HIGH (ref 0–149)
VLDL CHOLESTEROL CAL: 64 mg/dL — AB (ref 5–40)

## 2015-06-22 LAB — MICROALBUMIN / CREATININE URINE RATIO
CREATININE, UR: 134.7 mg/dL
MICROALB/CREAT RATIO: 51.1 mg/g{creat} — AB (ref 0.0–30.0)
MICROALBUM., U, RANDOM: 68.8 ug/mL

## 2015-07-01 ENCOUNTER — Ambulatory Visit (INDEPENDENT_AMBULATORY_CARE_PROVIDER_SITE_OTHER): Payer: BLUE CROSS/BLUE SHIELD | Admitting: Family Medicine

## 2015-07-01 ENCOUNTER — Encounter: Payer: Self-pay | Admitting: Family Medicine

## 2015-07-01 ENCOUNTER — Telehealth: Payer: Self-pay | Admitting: *Deleted

## 2015-07-01 VITALS — BP 150/90 | Ht 70.0 in | Wt 205.2 lb

## 2015-07-01 DIAGNOSIS — E1129 Type 2 diabetes mellitus with other diabetic kidney complication: Secondary | ICD-10-CM

## 2015-07-01 DIAGNOSIS — I10 Essential (primary) hypertension: Secondary | ICD-10-CM

## 2015-07-01 DIAGNOSIS — E785 Hyperlipidemia, unspecified: Secondary | ICD-10-CM

## 2015-07-01 DIAGNOSIS — R809 Proteinuria, unspecified: Secondary | ICD-10-CM

## 2015-07-01 DIAGNOSIS — G8929 Other chronic pain: Secondary | ICD-10-CM

## 2015-07-01 DIAGNOSIS — M25572 Pain in left ankle and joints of left foot: Secondary | ICD-10-CM

## 2015-07-01 DIAGNOSIS — R252 Cramp and spasm: Secondary | ICD-10-CM | POA: Diagnosis not present

## 2015-07-01 MED ORDER — OXYCODONE-ACETAMINOPHEN 10-325 MG PO TABS: ORAL_TABLET | ORAL | Status: DC

## 2015-07-01 MED ORDER — INSULIN LISPRO 200 UNIT/ML ~~LOC~~ SOPN: 140.0000 [IU] | PEN_INJECTOR | Freq: Every day | SUBCUTANEOUS | Status: DC

## 2015-07-01 MED ORDER — AMLODIPINE BESYLATE 10 MG PO TABS: 10.0000 mg | ORAL_TABLET | Freq: Every day | ORAL | Status: DC

## 2015-07-01 MED ORDER — ATORVASTATIN CALCIUM 80 MG PO TABS: ORAL_TABLET | ORAL | Status: DC

## 2015-07-01 MED ORDER — LIRAGLUTIDE 18 MG/3ML ~~LOC~~ SOPN: PEN_INJECTOR | SUBCUTANEOUS | Status: DC

## 2015-07-01 MED ORDER — LOSARTAN POTASSIUM-HCTZ 100-25 MG PO TABS: 1.0000 | ORAL_TABLET | Freq: Every day | ORAL | Status: DC

## 2015-07-01 MED ORDER — INSULIN GLARGINE 100 UNIT/ML SOLOSTAR PEN: PEN_INJECTOR | SUBCUTANEOUS | Status: DC

## 2015-07-01 MED ORDER — INSULIN ASPART 100 UNIT/ML ~~LOC~~ SOLN: SUBCUTANEOUS | Status: DC

## 2015-07-01 NOTE — Addendum Note (Signed)
Addended by: Ofilia Neas R on: 07/01/2015 01:12 PM   Modules accepted: Orders, Medications

## 2015-07-01 NOTE — Telephone Encounter (Signed)
Patient currently on  Insulin Lispro (HUMALOG KWIKPEN) 200 UNIT/ML SOPN [id number]      Order Details    Dose: 140-150 Units Route: Subcutaneous Frequency: Daily   Dispense Quantity:  45 mL Refills:  5 Fills Remaining:  5          Sig: Inject 140-150 Units into the skin daily.        This is now non preferred on patient;s insurance and novalog is no preferred. Please advise?

## 2015-07-01 NOTE — Telephone Encounter (Signed)
Spoke with patient and informed him per his insurance Humalog is not preferred. Informed patient per Dr.Scott Luking- we are sending over Novolog with the same instructions. Patient verbalized understanding.

## 2015-07-01 NOTE — Telephone Encounter (Signed)
If NovoLog is now the preferred medication it would be fine to fill this with the same instructions as his other medicine. It would be wise to make sure patient is aware that this is considered as equal to Humalog

## 2015-07-01 NOTE — Progress Notes (Signed)
Subjective:    Patient ID: Terry [name], male    DOB: [birth year], 55 y.o.   MRN: [id number]  Diabetes He presents for his follow-up diabetic visit. He has type 2 diabetes mellitus. There are no hypoglycemic associated symptoms. Pertinent negatives for hypoglycemia include no confusion. There are no diabetic associated symptoms. Pertinent negatives for diabetes include no chest pain, no fatigue, no polydipsia, no polyphagia and no weakness. There are no hypoglycemic complications. There are no diabetic complications. There are no known risk factors for coronary artery disease. Current diabetic treatment includes insulin injections. He is compliant with treatment all of the time.   Patient does take his 81 mg aspirin as far as prevention does take his cholesterol medicine tolerating it well takes his blood pressure medicine on a regular basis. Denies any problems with this. States blood pressure readings at home almost always better than what they are here. Patient relates chronic pain in his hip as well as his ankle uses oxycodone no more than 3 times daily. This helps keep his pain under reasonable control. Patient states that he has muscle cramps a lot. He would like to talk with the doctor about this.  He states her cramps are more so at nighttime wake him up at night sometimes he stretches his muscle sometimes ago he denies any other particular underlying issues Results for orders placed or performed in visit on [id number]  Basic metabolic panel  Result Value Ref Range   Glucose 78 65 - 99 mg/dL   BUN 12 6 - 24 mg/dL   Creatinine, Ser 0.98 0.76 - 1.27 mg/dL   GFR calc non Af Amer 88 >59 mL/min/1.73   GFR calc Af Amer 101 >59 mL/min/1.73   BUN/Creatinine Ratio 12 9 - 20   Sodium 142 134 - 144 mmol/L   Potassium 4.1 3.5 - 5.2 mmol/L   Chloride 98 96 - 106 mmol/L   CO2 26 18 - 29 mmol/L   Calcium 9.4 8.7 - 10.2 mg/dL  Lipid panel  Result Value Ref Range   Cholesterol, Total 149 100 - 199  mg/dL   Triglycerides 319 (H) 0 - 149 mg/dL   HDL 26 (L) >39 mg/dL   VLDL Cholesterol Cal 64 (H) 5 - 40 mg/dL   LDL Calculated 59 0 - 99 mg/dL   Chol/HDL Ratio 5.7 (H) 0.0 - 5.0 ratio units  Hepatic function panel  Result Value Ref Range   Total Protein 7.2 6.0 - 8.5 g/dL   Albumin 4.5 3.5 - 5.5 g/dL   Bilirubin Total 0.4 0.0 - 1.2 mg/dL   Bilirubin, Direct 0.10 0.00 - 0.40 mg/dL   Alkaline Phosphatase 57 39 - 117 IU/L   AST 30 0 - 40 IU/L   ALT 42 0 - 44 IU/L  Microalbumin / creatinine urine ratio  Result Value Ref Range   Creatinine, Urine 134.7 Not Estab. mg/dL   Microalbum.,U,Random 68.8 Not Estab. ug/mL   MICROALB/CREAT RATIO 51.1 (H) 0.0 - 30.0 mg/g creat  Hemoglobin A1c  Result Value Ref Range   Hgb A1c MFr Bld 9.2 (H) 4.8 - 5.6 %   Est. average glucose Bld gHb Est-mCnc 217 mg/dL     Review of Systems  Constitutional: Negative for activity change, appetite change and fatigue.  HENT: Negative for congestion.   Respiratory: Negative for cough.   Cardiovascular: Negative for chest pain.  Gastrointestinal: Negative for abdominal pain.  Endocrine: Negative for polydipsia and polyphagia.  Neurological: Negative for weakness.  Psychiatric/Behavioral:  Negative for confusion.       Objective:   Physical Exam  Constitutional: He appears well-nourished. No distress.  Cardiovascular: Normal rate, regular rhythm and normal heart sounds.   No murmur heard. Pulmonary/Chest: Effort normal and breath sounds normal. No respiratory distress.  Musculoskeletal: He exhibits no edema.  Lymphadenopathy:    He has no cervical adenopathy.  Neurological: He is alert.  Psychiatric: His behavior is normal.  Vitals reviewed.         Assessment & Plan:  1. Muscle cramps The patient was shown some stretching exercises he can do as well as importance of keeping hydrated also may take vitamin B complex may also try mustard - TSH - Magnesium  2. Essential hypertension,  benign Blood pressure subpar control increase the dose of medication losartan/HCTZ will now use 25 mg on the HCTZ component follow-up again in approximately 3-4 months  3. Diabetes mellitus with proteinuria ([facility]) Diabetes not under good control he moves doing much better job of checking sugars frequently watching his diet he is supposed to write down what he is eating over the course of the next week plus also more frequent blood sugar results and he needs to forward those to [place]  4. Hyperlipidemia Cholesterol not under bad control continue current measures watch diet closely  5. Chronic pain of left ankle Pain medications refilled patient denies abusing it does not use more than 3 per day. 3 prescriptions were given. Follow-up in 3 months

## 2015-07-01 NOTE — Telephone Encounter (Signed)
[facility] 07/01/15 

## 2015-07-02 LAB — MAGNESIUM: MAGNESIUM: 1.9 mg/dL (ref 1.6–2.3)

## 2015-07-02 LAB — TSH: TSH: 0.986 u[IU]/mL (ref 0.450–4.500)

## 2015-07-28 ENCOUNTER — Encounter: Payer: Self-pay | Admitting: Family Medicine

## 2015-09-28 ENCOUNTER — Other Ambulatory Visit: Payer: Self-pay | Admitting: Family Medicine

## 2015-10-01 ENCOUNTER — Ambulatory Visit: Payer: BLUE CROSS/BLUE SHIELD | Admitting: Family Medicine

## 2015-10-06 ENCOUNTER — Telehealth: Payer: Self-pay | Admitting: Family Medicine

## 2015-10-06 ENCOUNTER — Other Ambulatory Visit: Payer: Self-pay | Admitting: Family Medicine

## 2015-10-06 MED ORDER — OXYCODONE-ACETAMINOPHEN 10-325 MG PO TABS: ORAL_TABLET | ORAL | Status: DC

## 2015-10-06 NOTE — Telephone Encounter (Signed)
Patient has extreme pain in his hip, ankle and knee.  He has been out of his pain medication for a week, and wants to know if we can give him a refill to last him until his upcoming appointment on 10/16/15?

## 2015-10-06 NOTE — Telephone Encounter (Signed)
Last seen 07/01/15

## 2015-10-07 ENCOUNTER — Telehealth: Payer: Self-pay | Admitting: Family Medicine

## 2015-10-07 NOTE — Telephone Encounter (Signed)
Wife dropped off bp readings and sugar readings. In drs folder.

## 2015-10-07 NOTE — Telephone Encounter (Signed)
Script printed by dr Nicki Reaper. Pt notified script ready for pickup.

## 2015-10-08 NOTE — Telephone Encounter (Signed)
The readings are noted, we will check hemoglobin A1c at office visit in mid July

## 2015-10-16 ENCOUNTER — Ambulatory Visit (INDEPENDENT_AMBULATORY_CARE_PROVIDER_SITE_OTHER): Payer: BLUE CROSS/BLUE SHIELD | Admitting: Family Medicine

## 2015-10-16 ENCOUNTER — Encounter: Payer: Self-pay | Admitting: Family Medicine

## 2015-10-16 VITALS — BP 148/82 | Ht 70.0 in | Wt 211.0 lb

## 2015-10-16 DIAGNOSIS — I10 Essential (primary) hypertension: Secondary | ICD-10-CM

## 2015-10-16 DIAGNOSIS — G8929 Other chronic pain: Secondary | ICD-10-CM

## 2015-10-16 DIAGNOSIS — E1129 Type 2 diabetes mellitus with other diabetic kidney complication: Secondary | ICD-10-CM | POA: Diagnosis not present

## 2015-10-16 DIAGNOSIS — M25572 Pain in left ankle and joints of left foot: Secondary | ICD-10-CM

## 2015-10-16 DIAGNOSIS — E119 Type 2 diabetes mellitus without complications: Secondary | ICD-10-CM

## 2015-10-16 DIAGNOSIS — R809 Proteinuria, unspecified: Secondary | ICD-10-CM | POA: Diagnosis not present

## 2015-10-16 DIAGNOSIS — Z79891 Long term (current) use of opiate analgesic: Secondary | ICD-10-CM

## 2015-10-16 DIAGNOSIS — E785 Hyperlipidemia, unspecified: Secondary | ICD-10-CM | POA: Diagnosis not present

## 2015-10-16 LAB — POCT GLYCOSYLATED HEMOGLOBIN (HGB A1C): Hemoglobin A1C: 7.6

## 2015-10-16 MED ORDER — OXYCODONE-ACETAMINOPHEN 10-325 MG PO TABS: ORAL_TABLET | ORAL | Status: DC

## 2015-10-16 NOTE — Progress Notes (Signed)
Subjective:    Patient ID: Terry [name], male    DOB: 1960-04-18, 55 y.o.   MRN: [id number]  HPI  Talked at length about the patient's pain is medicine is helping him does not totally take away the makes his pain in his feet tolerable as well as his ankle he denies abusing the medicine denies taking anybody else's medicine.  Patient does take his cholesterol medicine recent lab work reviewed with the patient tolerates medicine well  Patient does take his blood pressure medicine his readings at home most of time a good today at is slightly elevated he does try to watch his diet he is not able to excise works 50+ hours per week  Urine drug screen and drug contract discussed today  25 minutes was spent with the patient. Greater than half the time was spent in discussion and answering questions and counseling regarding the issues that the patient came in for today.    Review of Systems  Constitutional: Negative for activity change, appetite change and fatigue.  HENT: Negative for congestion.   Respiratory: Negative for cough.   Cardiovascular: Negative for chest pain.  Gastrointestinal: Negative for abdominal pain.  Endocrine: Negative for polydipsia and polyphagia.  Neurological: Negative for weakness.  Psychiatric/Behavioral: Negative for confusion.       Objective:   Physical Exam  Constitutional: He appears well-nourished. No distress.  Cardiovascular: Normal rate, regular rhythm and normal heart sounds.   No murmur heard. Pulmonary/Chest: Effort normal and breath sounds normal. No respiratory distress.  Musculoskeletal: He exhibits no edema.  Lymphadenopathy:    He has no cervical adenopathy.  Neurological: He is alert.  Psychiatric: His behavior is normal.  Vitals reviewed.         Assessment & Plan:  The patient was seen today as part of a comprehensive visit for diabetes. The importance of keeping her A1c at or below 7 was discussed. Importance of regular  physical activity was discussed. Proper monitoring of glucose levels with glucometer discussed. The importance of adherence to medication as well as a controlled low starch/sugar diet was also discussed. Also discussion regarding the importance of diabetic foot checks including self check every day. Also yearly diabetic eye exams recommended. The importance of keeping blood pressure under control and keeping LDL below 100 was also discussed. Also the importance of avoiding smoking. Standard follow-up visit recommended. Finally failure to follow good diabetic measures including self effort and compliance with recommendations can certainly increase the risk of heart disease strokes kidney failure blindness loss of limb and early death was discussed with the patient. I am pleased with his diabetes he is making improvement in this continue current measures follow-up 3-4 months  HTN- Patient was seen today as part of a visit regarding hypertension. The importance of healthy diet and regular physical activity was discussed. The importance of compliance with medications discussed. Ideal goal is to keep blood pressure low elevated levels certainly below [id number] when possible. The patient was counseled that keeping blood pressure under control lessen his risk of heart attack, stroke, kidney failure, and early death. The importance of regular follow-ups was discussed with the patient. Low-salt diet such as DASH recommended. Regular physical activity was recommended as well. Patient was advised to keep regular follow-ups. Blood pressure borderline patient will check readings in for test results  The patient was seen today as part of an evaluation regarding hyperlipidemia. Recent lab work has been reviewed with the patient as well as the goals  for good cholesterol care. In addition to this medications have been discussed the importance of compliance with diet and medications discussed as well. Patient has been informed of  potential side effects of medications in the importance to notify us should any problems occur. Finally the patient is aware that poor control of cholesterol, noncompliance can dramatically increase her risk of heart attack strokes and premature death. The patient will keep regular office visits and the patient does agreed to periodic lab work. Patient will do new lab work before next visit  The patient was seen today as part of a comprehensive visit regarding pain control. Patient's compliance with the medication as well as discussion regarding effectiveness was completed. Prescriptions were written. Patient was advised to follow-up in 3 months. The patient was assessed for any signs of severe side effects. The patient was advised to take the medicine as directed and to report to [place] if any side effect issues. Urine drug screen collected prescription given to her being held follow-up in a proximally 3 months

## 2015-10-16 NOTE — Progress Notes (Signed)
   Subjective:    Patient ID: Terry [name], male    DOB: [birth year], 55 y.o.   MRN: [id number]  Diabetes He presents for his follow-up diabetic visit. He has type 2 diabetes mellitus. Current diabetic treatments: victoza, lantus, and novolog. He is compliant with treatment all of the time. He is following a generally unhealthy diet. He never (active job but no regular exercise) participates in exercise. His breakfast blood glucose range is generally 130-140 mg/dl. He does not see a podiatrist.Eye exam is current.  A1C 7.6  Pt states no concerns today.    Review of Systems     Objective:   Physical Exam        Assessment & Plan:

## 2015-10-16 NOTE — Patient Instructions (Signed)
Dear Patient,  It has been recommended to you that you have a colonoscopy. It is your responsibility to carry through with this recommendation.   Did you realize that colon cancer is the second leading cancer killer in the [place]. One in every 20 adults will get colon cancer. If all adults would go through the recommended screening for colon cancer (getting a colonoscopy), then there would be a 60% reduction in the number of people dying from colon cancer.  Colon cancer just doesn't come out of the blue. It starts off as a small polyp which over time grows into a cancer. A colonoscopy can prevent cancer and in many cases detected when it is at a very treatable phase. Small colon cancers can have cure rates of 95%. Advanced colon cancer, which often occurs in people who do not do their screenings, have cure rates less than 20%. The risk of colon cancer advances with age. Most adults should have regular colonoscopies every 10 years starting at age 50. This recommendation can vary depending on a person's medical history.  Health-care laws now allow for you to call the gastroenterologist office directly in order to set yourself up for this very important tests. Today we have recommended to you that you do this test. This test may save your life. Failure to do this test puts you at risk for premature death from colon cancer. Do the right thing and schedule this test now.  Here as a list of specialists we recommend in the surrounding area. When you call their office let them know that you are a patient of our practice in your interested in doing a screening colonoscopy. They should assist you without problems. You will need the following information when you called them: 1-name of which Dr. you see, 2-your insurance information, 3-a list of medications that you currently take, 4-any allergies you have to medications.  [place] gastroenterologist Dr. Mike Rourk, Dr Sandi Fields   [place]  gastroenterologist   [phone]  Dr.Najeeb Rehman [facility]   [phone]   gastroenterology [facility] (Dr. Perry, N, Stark, Brodie, Gesner, Jacobs and Pyrtle) [phone]  [facility] (Dr. Buscemi, Edwards, Hayes, Maygod,Outlaw,Schooler) [phone]  Each group of specialists has assured us that when you called them they will help you get your colonoscopy set up. Should you have problems or if the GI practice insist a referral be done please let us know. Be sure to call soon. Sincerely, Carolyn Hoskins, Dr Steve Luking, Dr.Scott Luking    

## 2015-10-25 LAB — TOXASSURE SELECT 13 (MW), URINE: PDF: 0

## 2015-12-08 ENCOUNTER — Other Ambulatory Visit: Payer: Self-pay | Admitting: Family Medicine

## 2015-12-09 ENCOUNTER — Other Ambulatory Visit: Payer: Self-pay | Admitting: Family Medicine

## 2015-12-14 ENCOUNTER — Telehealth: Payer: Self-pay | Admitting: Family Medicine

## 2015-12-14 NOTE — Telephone Encounter (Signed)
Pt has [place] test strips     [employer]

## 2015-12-14 NOTE — Telephone Encounter (Signed)
Spoke with patient's wife and informed her per Dr.Scott Luking- we are sending over a new prescription for glucometer and testing strips. Patient's wife verbalized

## 2015-12-14 NOTE — Telephone Encounter (Signed)
Patient requesting prescription for glucose monitor and strips to be called into [facility].

## 2015-12-14 NOTE — Telephone Encounter (Signed)
Please write out I will sign patient on insulin made test 4 times daily

## 2015-12-17 ENCOUNTER — Telehealth: Payer: Self-pay | Admitting: Family Medicine

## 2015-12-17 NOTE — Telephone Encounter (Signed)
It is possible but the patient needs to be fully aware that his insurance company may not go along with this whatsoever and even with an appeal they may well deny the appeal and he will have to go with the insulin that they cover. From a physiologic point of view these insulins work the same way and 1 should not cause diarrhea over the other from an insurance point of view. Talk with the patient if he once this to prescribe the other and try to go through getting it approved we can but I offer absolutely no guarantees that the noncovered insulin will get covered. Also if he does have to stick with the other insulin and has diarrhea with it we can always set him up with gastroenterology as well. Talk with him see what he once to do

## 2015-12-17 NOTE — Telephone Encounter (Addendum)
The Novalog that the patient's insurance wants to pay for gives him diarrhea constantly.  The Humalog that patient was taking, he didn't have a problem whatsoever.  Can we switch him back?   Larene Pickett

## 2015-12-18 MED ORDER — INSULIN LISPRO 100 UNIT/ML ~~LOC~~ SOLN
SUBCUTANEOUS | 1 refills | Status: DC
Start: 2015-12-18 — End: 2016-01-29

## 2015-12-18 NOTE — Telephone Encounter (Signed)
Spoke with patient and informed him per Dr.Scott Luking-It is possible but be fully aware that your insurance company may not go along with this whatsoever and even with an appeal they may well deny the appeal and he will have to go with the insulin that they cover. From a physiologic point of view these insulins work the same way and 1 should not cause diarrhea over the other from an insurance point of view.Patient verbalized understanding and stated he wanted to give humalog a chance again. Humalog sent into pharmacy.

## 2015-12-24 ENCOUNTER — Telehealth: Payer: Self-pay | Admitting: *Deleted

## 2015-12-24 NOTE — Telephone Encounter (Signed)
humalog kwikpen 100unit/ml approved. Ref number #[id number]. Pharm notified.

## 2015-12-28 ENCOUNTER — Other Ambulatory Visit: Payer: Self-pay | Admitting: Family Medicine

## 2016-01-07 ENCOUNTER — Telehealth: Payer: Self-pay | Admitting: Family Medicine

## 2016-01-07 NOTE — Telephone Encounter (Signed)
I reviewed over the glucose readings. Most of the morning readings are reasonable but the moderate amount of them are mildly elevated. I believe it is in the patient's best interest to increase long-acting insulin to 105 units daily keep follow-up office visit in early November

## 2016-01-07 NOTE — Telephone Encounter (Signed)
Pt dropped off blood sugar readings. In red folder in office.

## 2016-01-08 NOTE — Telephone Encounter (Signed)
Notified patient Dr. Nicki Reaper reviewed over the glucose readings. Most of the morning readings are reasonable but the moderate amount of them are mildly elevated. Dr. Nicki Reaper believes it is in the patient's best interest to increase long-acting insulin to 105 units daily keep follow-up office visit in early November. Patient verbalized understanding.

## 2016-01-08 NOTE — Telephone Encounter (Signed)
Left message on voicemail to return call.

## 2016-01-25 ENCOUNTER — Telehealth: Payer: Self-pay | Admitting: Family Medicine

## 2016-01-25 DIAGNOSIS — E785 Hyperlipidemia, unspecified: Secondary | ICD-10-CM

## 2016-01-25 DIAGNOSIS — Z125 Encounter for screening for malignant neoplasm of prostate: Secondary | ICD-10-CM

## 2016-01-25 DIAGNOSIS — Z79899 Other long term (current) drug therapy: Secondary | ICD-10-CM

## 2016-01-25 DIAGNOSIS — I10 Essential (primary) hypertension: Secondary | ICD-10-CM

## 2016-01-25 DIAGNOSIS — E119 Type 2 diabetes mellitus without complications: Secondary | ICD-10-CM

## 2016-01-25 NOTE — Telephone Encounter (Signed)
Requesting order for blood work for appointment on 01/29/16.

## 2016-01-26 NOTE — Telephone Encounter (Signed)
Discussed with pt. Pt states he will do bloodwork tomorrow. Orders put in.

## 2016-01-26 NOTE — Telephone Encounter (Signed)
Lipid, liver, metabolic 7, hemoglobin [id number], PSA-in order for me to get the results of this this needs to be done Elisa day before his visit. If he cannot do this blood work a day before his visit I recommend removing the A1c and we will do the A1c here in the office

## 2016-01-26 NOTE — Telephone Encounter (Signed)
Left message to return call 

## 2016-01-27 DIAGNOSIS — I10 Essential (primary) hypertension: Secondary | ICD-10-CM | POA: Diagnosis not present

## 2016-01-27 DIAGNOSIS — E119 Type 2 diabetes mellitus without complications: Secondary | ICD-10-CM | POA: Diagnosis not present

## 2016-01-27 DIAGNOSIS — E785 Hyperlipidemia, unspecified: Secondary | ICD-10-CM | POA: Diagnosis not present

## 2016-01-27 DIAGNOSIS — Z79899 Other long term (current) drug therapy: Secondary | ICD-10-CM | POA: Diagnosis not present

## 2016-01-27 DIAGNOSIS — Z125 Encounter for screening for malignant neoplasm of prostate: Secondary | ICD-10-CM | POA: Diagnosis not present

## 2016-01-28 LAB — HEPATIC FUNCTION PANEL
ALK PHOS: 53 IU/L (ref 39–117)
ALT: 47 IU/L — AB (ref 0–44)
AST: 33 IU/L (ref 0–40)
Albumin: 4.8 g/dL (ref 3.5–5.5)
BILIRUBIN, DIRECT: 0.14 mg/dL (ref 0.00–0.40)
Bilirubin Total: 0.6 mg/dL (ref 0.0–1.2)
Total Protein: 7.5 g/dL (ref 6.0–8.5)

## 2016-01-28 LAB — BASIC METABOLIC PANEL
BUN / CREAT RATIO: 15 (ref 9–20)
BUN: 17 mg/dL (ref 6–24)
CO2: 29 mmol/L (ref 18–29)
CREATININE: 1.15 mg/dL (ref 0.76–1.27)
Calcium: 10.1 mg/dL (ref 8.7–10.2)
Chloride: 97 mmol/L (ref 96–106)
GFR calc Af Amer: 83 mL/min/{1.73_m2} (ref >59)
GFR calc non Af Amer: 72 mL/min/{1.73_m2} (ref >59)
GLUCOSE: 159 mg/dL — AB (ref 65–99)
Potassium: 4.5 mmol/L (ref 3.5–5.2)
SODIUM: 140 mmol/L (ref 134–144)

## 2016-01-28 LAB — LIPID PANEL
CHOL/HDL RATIO: 6.7 ratio — AB (ref 0.0–5.0)
Cholesterol, Total: 208 mg/dL — ABNORMAL HIGH (ref 100–199)
HDL: 31 mg/dL — AB (ref >39)
TRIGLYCERIDES: 553 mg/dL — AB (ref 0–149)

## 2016-01-28 LAB — HEMOGLOBIN A1C
Est. average glucose Bld gHb Est-mCnc: 197 mg/dL
HEMOGLOBIN A1C: 8.5 % — AB (ref 4.8–5.6)

## 2016-01-28 LAB — PSA: Prostate Specific Ag, Serum: 0.2 ng/mL (ref 0.0–4.0)

## 2016-01-29 ENCOUNTER — Encounter: Payer: Self-pay | Admitting: Family Medicine

## 2016-01-29 ENCOUNTER — Ambulatory Visit (INDEPENDENT_AMBULATORY_CARE_PROVIDER_SITE_OTHER): Payer: BLUE CROSS/BLUE SHIELD | Admitting: Family Medicine

## 2016-01-29 VITALS — BP 140/82 | Ht 70.0 in | Wt 211.1 lb

## 2016-01-29 DIAGNOSIS — M722 Plantar fascial fibromatosis: Secondary | ICD-10-CM

## 2016-01-29 DIAGNOSIS — Z79891 Long term (current) use of opiate analgesic: Secondary | ICD-10-CM | POA: Diagnosis not present

## 2016-01-29 DIAGNOSIS — Z1159 Encounter for screening for other viral diseases: Secondary | ICD-10-CM | POA: Diagnosis not present

## 2016-01-29 DIAGNOSIS — E785 Hyperlipidemia, unspecified: Secondary | ICD-10-CM

## 2016-01-29 DIAGNOSIS — E1129 Type 2 diabetes mellitus with other diabetic kidney complication: Secondary | ICD-10-CM | POA: Diagnosis not present

## 2016-01-29 DIAGNOSIS — Z23 Encounter for immunization: Secondary | ICD-10-CM | POA: Diagnosis not present

## 2016-01-29 DIAGNOSIS — M25572 Pain in left ankle and joints of left foot: Secondary | ICD-10-CM

## 2016-01-29 DIAGNOSIS — I10 Essential (primary) hypertension: Secondary | ICD-10-CM

## 2016-01-29 DIAGNOSIS — G8929 Other chronic pain: Secondary | ICD-10-CM

## 2016-01-29 DIAGNOSIS — R809 Proteinuria, unspecified: Secondary | ICD-10-CM

## 2016-01-29 MED ORDER — INSULIN GLARGINE 100 UNIT/ML SOLOSTAR PEN: PEN_INJECTOR | SUBCUTANEOUS | 5 refills | Status: DC

## 2016-01-29 MED ORDER — OXYCODONE-ACETAMINOPHEN 10-325 MG PO TABS: ORAL_TABLET | ORAL | 0 refills | Status: DC

## 2016-01-29 MED ORDER — INSULIN LISPRO 100 UNIT/ML ~~LOC~~ SOLN: SUBCUTANEOUS | 5 refills | Status: DC

## 2016-01-29 NOTE — Patient Instructions (Signed)
Dear Patient,  It has been recommended to you that you have a colonoscopy. It is your responsibility to carry through with this recommendation.   Did you realize that colon cancer is the second leading cancer killer in the [place]. One in every 20 adults will get colon cancer. If all adults would go through the recommended screening for colon cancer (getting a colonoscopy), then there would be a 60% reduction in the number of people dying from colon cancer.  Colon cancer just doesn't come out of the blue. It starts off as a small polyp which over time grows into a cancer. A colonoscopy can prevent cancer and in many cases detected when it is at a very treatable phase. Small colon cancers can have cure rates of 95%. Advanced colon cancer, which often occurs in people who do not do their screenings, have cure rates less than 20%. The risk of colon cancer advances with age. Most adults should have regular colonoscopies every 10 years starting at age 50. This recommendation can vary depending on a person's medical history.  Health-care laws now allow for you to call the gastroenterologist office directly in order to set yourself up for this very important tests. Today we have recommended to you that you do this test. This test may save your life. Failure to do this test puts you at risk for premature death from colon cancer. Do the right thing and schedule this test now.  Here as a list of specialists we recommend in the surrounding area. When you call their office let them know that you are a patient of our practice in your interested in doing a screening colonoscopy. They should assist you without problems. You will need the following information when you called them: 1-name of which Dr. you see, 2-your insurance information, 3-a list of medications that you currently take, 4-any allergies you have to medications.  [place] gastroenterologist Dr. Mike Rourk, Dr Sandi Fields   [place]  gastroenterologist   [phone]  Dr.Najeeb Rehman [place] [facility]   [phone]   gastroenterology [facility] (Dr. Perry, N, Stark, Brodie, Gesner, Jacobs and Pyrtle) [phone]  [facility] (Dr. Buscemi, Edwards, Hayes, Maygod,Outlaw,Schooler) [phone]  Each group of specialists has assured us that when you called them they will help you get your colonoscopy set up. Should you have problems or if the GI practice insist a referral be done please let us know. Be sure to call soon. Sincerely, Carolyn Hoskins, Dr Steve Luking, Dr.Scott Luking    

## 2016-01-29 NOTE — Progress Notes (Signed)
   Subjective:    Patient ID: Terry [name], male    DOB: 1960/09/13, 55 y.o.   MRN: [id number]  Diabetes  He presents for his follow-up diabetic visit. He has type 2 diabetes mellitus. There are no hypoglycemic associated symptoms. There are no diabetic associated symptoms. There are no hypoglycemic complications. There are no diabetic complications. There are no known risk factors for coronary artery disease. Current diabetic treatment includes insulin injections. He is compliant with treatment all of the time.  Patient denies a low sugar spells unless he doesn't need an afternoon snack I told him because he eats lunch at noon and supper 6 he does need a small afternoon snack He does take his pain medicine does not use more informed a day most days only 3 a day he is getting continue this He does take his blood pressure medicine on a regular basis denies missing it Patient is having bilateral feet pain. Onset 3 weeks ago. Relates heel pain worse on the right side and left side when he walks around   Review of Systems     Objective:   Physical Exam Lungs clear heart regular neck no masses abdomen soft extremities has plantar fasciitis on the right side with healed tenderness  Diabetic foot exam completed Previous labs revealed an reviewed continue medication    Assessment & Plan:  1. Hyperlipidemia, unspecified hyperlipidemia type Previous labs were reviewed continue current medications follow-up of problems - Lipid panel  2. Hypertension, unspecified type Blood pressure slightly high here but his blood pressure readings at home of been good continue medication  3. Diabetes mellitus with proteinuria (HCC) A1c is not under good control the importance of watching diet regular physical activity and titrating insulin accordingly to get fasting sugar closer to 100 is wise - Hemoglobin A1c  4. Plantar fasciitis May use anti-inflammatories over the next 7-10 days if that doesn't do enough  consider podiatry stretching excises cold compresses  5. Chronic pain of left ankle Chronic pain and discomfort uses pain medicine anywhere from 2-4 times per day 90 tablets last month re-scripts were given.  6. Encounter for long-term opiate analgesic use The patient was seen today as part of a comprehensive visit regarding pain control. Patient's compliance with the medication as well as discussion regarding effectiveness was completed. Prescriptions were written. Patient was advised to follow-up in 3 months. The patient was assessed for any signs of severe side effects. The patient was advised to take the medicine as directed and to report to [place] if any side effect issues.   7. Encounter for hepatitis C screening test for low risk patient Screening - Hepatitis C Antibody  8. Need for vaccination Flu shot today - Flu Vaccine QUAD 36+ mos IM

## 2016-01-29 NOTE — Progress Notes (Signed)
diabete

## 2016-02-15 ENCOUNTER — Other Ambulatory Visit: Payer: Self-pay | Admitting: Family Medicine

## 2016-02-16 DIAGNOSIS — M25579 Pain in unspecified ankle and joints of unspecified foot: Secondary | ICD-10-CM | POA: Diagnosis not present

## 2016-02-16 DIAGNOSIS — M79671 Pain in right foot: Secondary | ICD-10-CM | POA: Diagnosis not present

## 2016-03-07 DIAGNOSIS — M79671 Pain in right foot: Secondary | ICD-10-CM | POA: Diagnosis not present

## 2016-03-07 DIAGNOSIS — M722 Plantar fascial fibromatosis: Secondary | ICD-10-CM | POA: Diagnosis not present

## 2016-03-07 DIAGNOSIS — M25579 Pain in unspecified ankle and joints of unspecified foot: Secondary | ICD-10-CM | POA: Diagnosis not present

## 2016-03-29 DIAGNOSIS — M7752 Other enthesopathy of left foot: Secondary | ICD-10-CM | POA: Diagnosis not present

## 2016-03-29 DIAGNOSIS — M79672 Pain in left foot: Secondary | ICD-10-CM | POA: Diagnosis not present

## 2016-04-25 DIAGNOSIS — E785 Hyperlipidemia, unspecified: Secondary | ICD-10-CM | POA: Diagnosis not present

## 2016-04-25 DIAGNOSIS — R809 Proteinuria, unspecified: Secondary | ICD-10-CM | POA: Diagnosis not present

## 2016-04-25 DIAGNOSIS — E1129 Type 2 diabetes mellitus with other diabetic kidney complication: Secondary | ICD-10-CM | POA: Diagnosis not present

## 2016-04-25 DIAGNOSIS — Z1159 Encounter for screening for other viral diseases: Secondary | ICD-10-CM | POA: Diagnosis not present

## 2016-04-26 LAB — LIPID PANEL
Chol/HDL Ratio: 7.4 ratio units — ABNORMAL HIGH (ref 0.0–5.0)
Cholesterol, Total: 214 mg/dL — ABNORMAL HIGH (ref 100–199)
HDL: 29 mg/dL — ABNORMAL LOW (ref >39)
Triglycerides: 538 mg/dL — ABNORMAL HIGH (ref 0–149)

## 2016-04-26 LAB — HEPATITIS C ANTIBODY: Hep C Virus Ab: <0.1 s/co ratio (ref 0.0–0.9)

## 2016-04-26 LAB — HEMOGLOBIN A1C
ESTIMATED AVERAGE GLUCOSE: 197 mg/dL
HEMOGLOBIN A1C: 8.5 % — AB (ref 4.8–5.6)

## 2016-05-02 ENCOUNTER — Ambulatory Visit (INDEPENDENT_AMBULATORY_CARE_PROVIDER_SITE_OTHER): Payer: BLUE CROSS/BLUE SHIELD | Admitting: Family Medicine

## 2016-05-02 ENCOUNTER — Encounter: Payer: Self-pay | Admitting: Family Medicine

## 2016-05-02 VITALS — BP 144/92 | Ht 70.0 in | Wt 209.0 lb

## 2016-05-02 DIAGNOSIS — M25572 Pain in left ankle and joints of left foot: Secondary | ICD-10-CM | POA: Diagnosis not present

## 2016-05-02 DIAGNOSIS — E785 Hyperlipidemia, unspecified: Secondary | ICD-10-CM | POA: Diagnosis not present

## 2016-05-02 DIAGNOSIS — G8929 Other chronic pain: Secondary | ICD-10-CM

## 2016-05-02 DIAGNOSIS — I10 Essential (primary) hypertension: Secondary | ICD-10-CM | POA: Diagnosis not present

## 2016-05-02 DIAGNOSIS — R809 Proteinuria, unspecified: Secondary | ICD-10-CM

## 2016-05-02 DIAGNOSIS — E1129 Type 2 diabetes mellitus with other diabetic kidney complication: Secondary | ICD-10-CM | POA: Diagnosis not present

## 2016-05-02 MED ORDER — OXYCODONE-ACETAMINOPHEN 10-325 MG PO TABS: ORAL_TABLET | ORAL | 0 refills | Status: DC

## 2016-05-02 MED ORDER — AMLODIPINE BESYLATE 10 MG PO TABS: 10.0000 mg | ORAL_TABLET | Freq: Every day | ORAL | 1 refills | Status: DC

## 2016-05-02 MED ORDER — ATORVASTATIN CALCIUM 80 MG PO TABS: 80.0000 mg | ORAL_TABLET | Freq: Every day | ORAL | 1 refills | Status: DC

## 2016-05-02 MED ORDER — METOPROLOL SUCCINATE ER 25 MG PO TB24: 25.0000 mg | ORAL_TABLET | Freq: Every day | ORAL | 3 refills | Status: DC

## 2016-05-02 NOTE — Progress Notes (Signed)
   Subjective:    Patient ID: Terry [name], male    DOB: [birth year], 56 y.o.   MRN: [id number]  HPI This patient was seen today for chronic pain  The medication list was reviewed and updated.   -Compliance with medication: yes  - Number patient states they take daily: 3 daily   -when was the last dose patient took? today  The patient was advised the importance of maintaining medication and not using illegal substances with these.  Refills needed: yes  The patient was educated that we can provide 3 monthly scripts for their medication, it is their responsibility to follow the instructions.  Side effects or complications from medications: none  Patient is aware that pain medications are meant to minimize the severity of the pain to allow their pain levels to improve to allow for better function. They are aware of that pain medications cannot totally remove their pain.  Due for UDT ( at least once per year) : utd  Patient has no new concerns at this time. Patient has completed bloodwork.  Seeing podiatry getting shots  Patient is diabetic has significant insulin resistance he tries to watch his diet he uses his insulin as directed Recently he is gotten several shots of steroids by the podiatrist this is caused his numbers to go up He does try to watch his diet Doesn't smoke Has chronic pain uses pain medicine 3 times a day for ankle pain as well as back pain States he's up-to-date on eye exam Patient takes his cholesterol medicine on a regular basis   Review of Systems  Constitutional: Negative for activity change, appetite change and fatigue.  HENT: Negative for congestion.   Respiratory: Negative for cough.   Cardiovascular: Negative for chest pain.  Gastrointestinal: Negative for abdominal pain.  Endocrine: Negative for polydipsia and polyphagia.  Musculoskeletal: Positive for arthralgias and back pain.  Neurological: Negative for weakness.  Psychiatric/Behavioral:  Negative for confusion.       Objective:   Physical Exam  Constitutional: He appears well-nourished. No distress.  Cardiovascular: Normal rate, regular rhythm and normal heart sounds.   No murmur heard. Pulmonary/Chest: Effort normal and breath sounds normal. No respiratory distress.  Musculoskeletal: He exhibits no edema.  Lymphadenopathy:    He has no cervical adenopathy.  Neurological: He is alert.  Psychiatric: His behavior is normal.  Vitals reviewed.         Assessment & Plan:  1. Hyperlipidemia, unspecified hyperlipidemia type Patient understands importance keeping cholesterol under control previous labs reviewed continue current medications - Lipid panel  2. Hypertension, unspecified type Blood pressure subpar control add metoprolol. - Hepatic function panel - Basic metabolic panel  3. Diabetes mellitus with proteinuria (HCC) Diabetes subpar control importance of getting it under better control discussed in detail continue medication adjustments insulin continue Victoza-patient was told that if we don't get diabetes under better control we will have to set him back up with endocrinology - Hemoglobin A1c - Microalbumin / creatinine urine ratio  4. Chronic pain of left ankle 3 prescriptions pain medicine given patient does not abuse medicine. Continue current measures.

## 2016-05-10 ENCOUNTER — Telehealth: Payer: Self-pay | Admitting: Family Medicine

## 2016-05-10 MED ORDER — DICLOFENAC SODIUM 75 MG PO TBEC: 75.0000 mg | DELAYED_RELEASE_TABLET | Freq: Two times a day (BID) | ORAL | 2 refills | Status: DC

## 2016-05-10 NOTE — Telephone Encounter (Signed)
Please verify dosage that was used by podiatrist it sounds like we will have to reinitiate the medication.

## 2016-05-10 NOTE — Telephone Encounter (Signed)
Wife called because husband was here a little over a week ago and talked with Dr. Nicki Reaper about the anti-inflammatory medicine the podiatrist gave him.  VOLTAREN (diclofenac sodium) Dr. Nicki Reaper wanted him to try to get off it but his wife said that he did but now he is in alot more pain.  He is willing to have extra lab work done or change the dosage or even the medicine, whatever it takes but he needs this or something similar.  Does Dr. Nicki Reaper want to prescribe a different medicine or a different dosage? Doesn't have any refills left. Uses [facility]. Patient at work so call wife [name] at [phone].

## 2016-05-10 NOTE — Telephone Encounter (Signed)
Prescription sent electronically to pharmacy. Patient notified. 

## 2016-05-10 NOTE — Telephone Encounter (Signed)
Voltaren 75 mg 1 twice a day, #60, 2 refills, use twice a day when necessary

## 2016-05-10 NOTE — Telephone Encounter (Signed)
Patient has never filled diclofenac at pharmacy but was taking a old prescription and gets relief at twice a day

## 2016-06-24 ENCOUNTER — Other Ambulatory Visit: Payer: Self-pay | Admitting: Family Medicine

## 2016-07-09 ENCOUNTER — Other Ambulatory Visit: Payer: Self-pay | Admitting: Family Medicine

## 2016-08-01 ENCOUNTER — Ambulatory Visit (INDEPENDENT_AMBULATORY_CARE_PROVIDER_SITE_OTHER): Payer: BLUE CROSS/BLUE SHIELD | Admitting: Family Medicine

## 2016-08-01 VITALS — BP 140/80 | Ht 70.0 in | Wt 209.2 lb

## 2016-08-01 DIAGNOSIS — M2550 Pain in unspecified joint: Secondary | ICD-10-CM

## 2016-08-01 DIAGNOSIS — E1129 Type 2 diabetes mellitus with other diabetic kidney complication: Secondary | ICD-10-CM

## 2016-08-01 DIAGNOSIS — E784 Other hyperlipidemia: Secondary | ICD-10-CM | POA: Diagnosis not present

## 2016-08-01 DIAGNOSIS — E785 Hyperlipidemia, unspecified: Secondary | ICD-10-CM | POA: Diagnosis not present

## 2016-08-01 DIAGNOSIS — R809 Proteinuria, unspecified: Secondary | ICD-10-CM | POA: Diagnosis not present

## 2016-08-01 DIAGNOSIS — E119 Type 2 diabetes mellitus without complications: Secondary | ICD-10-CM | POA: Diagnosis not present

## 2016-08-01 DIAGNOSIS — I10 Essential (primary) hypertension: Secondary | ICD-10-CM | POA: Diagnosis not present

## 2016-08-01 DIAGNOSIS — E7849 Other hyperlipidemia: Secondary | ICD-10-CM

## 2016-08-01 MED ORDER — OXYCODONE-ACETAMINOPHEN 10-325 MG PO TABS: ORAL_TABLET | ORAL | 0 refills | Status: DC

## 2016-08-01 NOTE — Progress Notes (Signed)
   Subjective:    Patient ID: Terry [name], male    DOB: 09/04/1960, 56 y.o.   MRN: [id number]  HPI This patient was seen today for chronic pain  The medication list was reviewed and updated.   -Compliance with medication: Takes daily  - Number patient states they take daily: Patient states takes 4 per day.   -when was the last dose patient took? Last night   The patient was advised the importance of maintaining medication and not using illegal substances with these.  Refills needed: Yes   The patient was educated that we can provide 3 monthly scripts for their medication, it is their responsibility to follow the instructions.  Side effects or complications from medications: Constipation   Patient is aware that pain medications are meant to minimize the severity of the pain to allow their pain levels to improve to allow for better function. They are aware of that pain medications cannot totally remove their pain.  Due for UDT ( at least once per year) : July 21st 2018.  Patient has concerns of foot pain.  Takes his blood pressure medicine on a regular basis. Takes his anti-inflammatory for hands and knees and feet Takes his insulin on a regular basis using a large amount because insulin resistance Having some low sugar spells several times per week Occasionally having sugar spells above 200 Patient had recent lab work which shows A1c not under good control he is repeating his lab work later today Patient does not want to go see a diabetic specialist because he states he has a hard time affording that Patient relates that he is very busy it's very difficult for him to make time to take care of himself the way he would like to       Review of Systems  Constitutional: Negative for activity change, appetite change and fatigue.  HENT: Negative for congestion.   Respiratory: Negative for cough.   Cardiovascular: Negative for chest pain.  Gastrointestinal: Negative for abdominal  pain.  Endocrine: Negative for polydipsia and polyphagia.  Neurological: Negative for weakness.  Psychiatric/Behavioral: Negative for confusion.       Objective:   Physical Exam  Constitutional: He appears well-nourished. No distress.  Cardiovascular: Normal rate, regular rhythm and normal heart sounds.   No murmur heard. Pulmonary/Chest: Effort normal and breath sounds normal. No respiratory distress.  Musculoskeletal: He exhibits no edema.  Lymphadenopathy:    He has no cervical adenopathy.  Neurological: He is alert.  Psychiatric: His behavior is normal.  Vitals reviewed.         Assessment & Plan:  Patient was advised colonoscopy  Hyperlipidemia previous labs reviewed continue current medications  Diabetes poor control we talked about the importance of staying smart with the eating choices as well as insulin also the importance of him recording his readings and sending them back to [place] on a regular basis  Arthralgias continue Voltaren  Bilateral foot neuropathy painful neuropathy continue pain medicine 3-4 times per day drug registry was checked patient denies abusing it does help him function  Blood pressure is elevated here in the office but he states under good control at home patient does have element of office hypertension  Patient to follow-up in 3 months

## 2016-08-02 LAB — HEPATIC FUNCTION PANEL
ALT: 48 IU/L — AB (ref 0–44)
AST: 32 IU/L (ref 0–40)
Albumin: 4.4 g/dL (ref 3.5–5.5)
Alkaline Phosphatase: 58 IU/L (ref 39–117)
BILIRUBIN TOTAL: 0.3 mg/dL (ref 0.0–1.2)
BILIRUBIN, DIRECT: 0.12 mg/dL (ref 0.00–0.40)
Total Protein: 7.3 g/dL (ref 6.0–8.5)

## 2016-08-02 LAB — BASIC METABOLIC PANEL
BUN/Creatinine Ratio: 26 — ABNORMAL HIGH (ref 9–20)
BUN: 30 mg/dL — ABNORMAL HIGH (ref 6–24)
CHLORIDE: 93 mmol/L — AB (ref 96–106)
CO2: 24 mmol/L (ref 18–29)
Calcium: 10.1 mg/dL (ref 8.7–10.2)
Creatinine, Ser: 1.14 mg/dL (ref 0.76–1.27)
GFR, EST AFRICAN AMERICAN: 84 mL/min/{1.73_m2} (ref >59)
GFR, EST NON AFRICAN AMERICAN: 73 mL/min/{1.73_m2} (ref >59)
Glucose: 288 mg/dL — ABNORMAL HIGH (ref 65–99)
POTASSIUM: 4.6 mmol/L (ref 3.5–5.2)
SODIUM: 136 mmol/L (ref 134–144)

## 2016-08-02 LAB — MICROALBUMIN / CREATININE URINE RATIO
CREATININE, UR: 135.7 mg/dL
Microalb/Creat Ratio: 51.5 mg/g creat — ABNORMAL HIGH (ref 0.0–30.0)
Microalbumin, Urine: 69.9 ug/mL

## 2016-08-02 LAB — HEMOGLOBIN A1C
Est. average glucose Bld gHb Est-mCnc: 200 mg/dL
Hgb A1c MFr Bld: 8.6 % — ABNORMAL HIGH (ref 4.8–5.6)

## 2016-08-02 LAB — LIPID PANEL
CHOLESTEROL TOTAL: 225 mg/dL — AB (ref 100–199)
Chol/HDL Ratio: 11.3 ratio — ABNORMAL HIGH (ref 0.0–5.0)
HDL: 20 mg/dL — AB (ref >39)
TRIGLYCERIDES: 1471 mg/dL — AB (ref 0–149)

## 2016-08-09 ENCOUNTER — Other Ambulatory Visit: Payer: Self-pay | Admitting: Family Medicine

## 2016-08-12 ENCOUNTER — Other Ambulatory Visit: Payer: Self-pay | Admitting: Family Medicine

## 2016-08-15 ENCOUNTER — Other Ambulatory Visit: Payer: Self-pay | Admitting: Family Medicine

## 2016-08-19 ENCOUNTER — Telehealth: Payer: Self-pay | Admitting: Family Medicine

## 2016-08-19 NOTE — Telephone Encounter (Signed)
Pt dropped off glucose logs. In folder in dr office.

## 2016-08-22 ENCOUNTER — Encounter: Payer: Self-pay | Admitting: Family Medicine

## 2016-08-22 NOTE — Telephone Encounter (Signed)
Letter completed to the patient follow-up in August

## 2016-09-05 ENCOUNTER — Telehealth: Payer: Self-pay | Admitting: Family Medicine

## 2016-09-05 NOTE — Telephone Encounter (Signed)
Pt dropped off glucose log. Log in red folder in office.

## 2016-09-06 ENCOUNTER — Encounter: Payer: Self-pay | Admitting: Family Medicine

## 2016-09-06 NOTE — Telephone Encounter (Signed)
The numbers were reviewed and message was sent to the patient via my chart

## 2016-09-14 ENCOUNTER — Other Ambulatory Visit: Payer: Self-pay | Admitting: Family Medicine

## 2016-09-15 NOTE — Telephone Encounter (Signed)
Last seen 08/02/16

## 2016-09-23 ENCOUNTER — Other Ambulatory Visit: Payer: Self-pay | Admitting: Family Medicine

## 2016-10-07 ENCOUNTER — Telehealth: Payer: Self-pay | Admitting: Family Medicine

## 2016-10-07 NOTE — Telephone Encounter (Signed)
Review glucose readings in results folder.

## 2016-10-10 NOTE — Telephone Encounter (Signed)
I called and left a vm, asked to r/c.

## 2016-10-10 NOTE — Telephone Encounter (Signed)
Patient notified The readings look good. Send [place] more readings in approximately one month. Patient verbalized understanding.

## 2016-10-10 NOTE — Telephone Encounter (Signed)
The readings look good. Send [place] more readings in approximately one month

## 2016-10-11 ENCOUNTER — Other Ambulatory Visit: Payer: Self-pay | Admitting: Family Medicine

## 2016-10-18 ENCOUNTER — Other Ambulatory Visit: Payer: Self-pay | Admitting: Family Medicine

## 2016-10-19 ENCOUNTER — Other Ambulatory Visit: Payer: Self-pay | Admitting: Family Medicine

## 2016-10-25 ENCOUNTER — Other Ambulatory Visit: Payer: Self-pay | Admitting: Family Medicine

## 2016-10-25 DIAGNOSIS — E1129 Type 2 diabetes mellitus with other diabetic kidney complication: Secondary | ICD-10-CM | POA: Diagnosis not present

## 2016-10-25 DIAGNOSIS — E785 Hyperlipidemia, unspecified: Secondary | ICD-10-CM | POA: Diagnosis not present

## 2016-10-25 DIAGNOSIS — R809 Proteinuria, unspecified: Secondary | ICD-10-CM | POA: Diagnosis not present

## 2016-10-25 DIAGNOSIS — I10 Essential (primary) hypertension: Secondary | ICD-10-CM | POA: Diagnosis not present

## 2016-10-26 LAB — BASIC METABOLIC PANEL
BUN / CREAT RATIO: 19 (ref 9–20)
BUN: 23 mg/dL (ref 6–24)
CO2: 27 mmol/L (ref 20–29)
CREATININE: 1.22 mg/dL (ref 0.76–1.27)
Calcium: 9.8 mg/dL (ref 8.7–10.2)
Chloride: 100 mmol/L (ref 96–106)
GFR, EST AFRICAN AMERICAN: 77 mL/min/{1.73_m2} (ref >59)
GFR, EST NON AFRICAN AMERICAN: 67 mL/min/{1.73_m2} (ref >59)
Glucose: 81 mg/dL (ref 65–99)
POTASSIUM: 4.3 mmol/L (ref 3.5–5.2)
SODIUM: 142 mmol/L (ref 134–144)

## 2016-10-26 LAB — LIPID PANEL
CHOL/HDL RATIO: 6.1 ratio — AB (ref 0.0–5.0)
Cholesterol, Total: 158 mg/dL (ref 100–199)
HDL: 26 mg/dL — ABNORMAL LOW (ref >39)
Triglycerides: 444 mg/dL — ABNORMAL HIGH (ref 0–149)

## 2016-10-26 LAB — HEPATIC FUNCTION PANEL
ALT: 40 IU/L (ref 0–44)
AST: 29 IU/L (ref 0–40)
Albumin: 4.8 g/dL (ref 3.5–5.5)
Alkaline Phosphatase: 51 IU/L (ref 39–117)
BILIRUBIN TOTAL: 0.4 mg/dL (ref 0.0–1.2)
BILIRUBIN, DIRECT: 0.12 mg/dL (ref 0.00–0.40)
Total Protein: 7.3 g/dL (ref 6.0–8.5)

## 2016-10-26 LAB — MICROALBUMIN / CREATININE URINE RATIO
CREATININE, UR: 280.1 mg/dL
Microalb/Creat Ratio: 41.2 mg/g creat — ABNORMAL HIGH (ref 0.0–30.0)
Microalbumin, Urine: 115.3 ug/mL

## 2016-10-26 LAB — HEMOGLOBIN A1C
Est. average glucose Bld gHb Est-mCnc: 163 mg/dL
Hgb A1c MFr Bld: 7.3 % — ABNORMAL HIGH (ref 4.8–5.6)

## 2016-10-29 ENCOUNTER — Other Ambulatory Visit: Payer: Self-pay | Admitting: Family Medicine

## 2016-11-01 ENCOUNTER — Encounter: Payer: Self-pay | Admitting: Family Medicine

## 2016-11-01 ENCOUNTER — Ambulatory Visit (INDEPENDENT_AMBULATORY_CARE_PROVIDER_SITE_OTHER): Payer: BLUE CROSS/BLUE SHIELD | Admitting: Family Medicine

## 2016-11-01 VITALS — BP 142/82 | Ht 70.0 in | Wt 206.2 lb

## 2016-11-01 DIAGNOSIS — E784 Other hyperlipidemia: Secondary | ICD-10-CM

## 2016-11-01 DIAGNOSIS — R1310 Dysphagia, unspecified: Secondary | ICD-10-CM

## 2016-11-01 DIAGNOSIS — E1129 Type 2 diabetes mellitus with other diabetic kidney complication: Secondary | ICD-10-CM

## 2016-11-01 DIAGNOSIS — R1319 Other dysphagia: Secondary | ICD-10-CM

## 2016-11-01 DIAGNOSIS — E7849 Other hyperlipidemia: Secondary | ICD-10-CM

## 2016-11-01 DIAGNOSIS — R809 Proteinuria, unspecified: Secondary | ICD-10-CM | POA: Diagnosis not present

## 2016-11-01 DIAGNOSIS — I10 Essential (primary) hypertension: Secondary | ICD-10-CM | POA: Diagnosis not present

## 2016-11-01 MED ORDER — OXYCODONE-ACETAMINOPHEN 10-325 MG PO TABS: ORAL_TABLET | ORAL | 0 refills | Status: DC

## 2016-11-01 NOTE — Progress Notes (Signed)
Subjective:    Patient ID: Terry [name], male    DOB: [birth year], 56 y.o.   MRN: [id number]  Diabetes  He presents for his follow-up diabetic visit. He has type 2 diabetes mellitus. Pertinent negatives for hypoglycemia include no confusion. Pertinent negatives for diabetes include no chest pain, no fatigue, no polydipsia, no polyphagia and no weakness. Risk factors for coronary artery disease include diabetes mellitus and hypertension. Current diabetic treatment includes insulin injections. He is compliant with treatment all of the time. His weight is stable. He is following a diabetic diet. He has not had a previous visit with a dietitian. He does not see a podiatrist.Eye exam is not current.    This patient was seen today for chronic pain  The medication list was reviewed and updated.   -Compliance with medication: He states he takes 3-4 day often runs out several days per month  - Number patient states they take daily: 3-4 per day  -when was the last dose patient took? He ran out midday yesterday  The patient was advised the importance of maintaining medication and not using illegal substances with these.  Refills needed: Does need refills  The patient was educated that we can provide 3 monthly scripts for their medication, it is their responsibility to follow the instructions.  Side effects or complications from medications: Denies any side effects with medications  Patient is aware that pain medications are meant to minimize the severity of the pain to allow their pain levels to improve to allow for better function. They are aware of that pain medications cannot totally remove their pain.  Due for UDT ( at least once per year) : Next visit  Patient states blood pressure at home is always in the 126/80 range He is having some dysphagia issues on a regular basis Has chronic back hip ankle and foot pain uses oxycodone helps him get through the day he takes a proximally 4 day he  denies abusing it Has hyperlipidemia takes his medicine previous labs reviewed       Review of Systems  Constitutional: Negative for activity change, appetite change and fatigue.  HENT: Negative for congestion.   Respiratory: Negative for cough.   Cardiovascular: Negative for chest pain.  Gastrointestinal: Negative for abdominal pain.  Endocrine: Negative for polydipsia and polyphagia.  Neurological: Negative for weakness.  Psychiatric/Behavioral: Negative for confusion.       Objective:   Physical Exam  Constitutional: He appears well-nourished. No distress.  Cardiovascular: Normal rate, regular rhythm and normal heart sounds.   No murmur heard. Pulmonary/Chest: Effort normal and breath sounds normal. No respiratory distress.  Musculoskeletal: He exhibits no edema.  Lymphadenopathy:    He has no cervical adenopathy.  Neurological: He is alert.  Psychiatric: His behavior is normal.  Vitals reviewed. Diabetic foot exam minimal neuropathy      Assessment & Plan:  Diabetes actually better control than what it has been which I'm pleased about. In addition to this he is not had any low sugar spells he will continue current measures  HTN good control with his home readings he does have an element of office hypertension  Hyperlipidemia patient does try to watch his diet previous labs reviewed continue statin  Chronic pain discomfort it does help him he denies abusing it The patient was seen today as part of a comprehensive visit regarding pain control. Patient's compliance with the medication as well as discussion regarding effectiveness was completed. Prescriptions were written. Patient was  advised to follow-up in 3 months. The patient was assessed for any signs of severe side effects. The patient was advised to take the medicine as directed and to report to [place] if any side effect issues.  25 minutes spent in patient greater than half in discussion  Urine drug screen next  visit

## 2016-11-08 ENCOUNTER — Encounter: Payer: Self-pay | Admitting: Family Medicine

## 2016-11-09 ENCOUNTER — Encounter (INDEPENDENT_AMBULATORY_CARE_PROVIDER_SITE_OTHER): Payer: Self-pay | Admitting: *Deleted

## 2016-11-14 ENCOUNTER — Other Ambulatory Visit: Payer: Self-pay | Admitting: Family Medicine

## 2016-12-03 ENCOUNTER — Other Ambulatory Visit: Payer: Self-pay | Admitting: Family Medicine

## 2016-12-16 ENCOUNTER — Other Ambulatory Visit: Payer: Self-pay | Admitting: Family Medicine

## 2016-12-28 ENCOUNTER — Telehealth: Payer: Self-pay | Admitting: Family Medicine

## 2016-12-28 NOTE — Telephone Encounter (Signed)
Pt dropped off his blood glucose log. Please see in yellow folder in office.

## 2016-12-29 ENCOUNTER — Encounter: Payer: Self-pay | Admitting: Family Medicine

## 2016-12-29 NOTE — Telephone Encounter (Signed)
Readings overall looks very good patient has follow-up in early November

## 2017-01-06 ENCOUNTER — Telehealth: Payer: Self-pay | Admitting: Family Medicine

## 2017-01-06 MED ORDER — SILDENAFIL CITRATE 20 MG PO TABS: ORAL_TABLET | ORAL | 6 refills | Status: DC

## 2017-01-06 NOTE — Telephone Encounter (Signed)
Patient is aware we sent in the rx.

## 2017-01-06 NOTE — Telephone Encounter (Signed)
sameas rxed efore with six ref

## 2017-01-06 NOTE — Telephone Encounter (Signed)
Requesting Rx for Sildenafil 20 mg to [facility].

## 2017-01-06 NOTE — Telephone Encounter (Signed)
Patient is requesting sildenafil 20 to be sent in to [facility]. Last seen 11/01/2016 for esophageal dysphagia. Please advise.Thanks

## 2017-01-14 LAB — HM DIABETES EYE EXAM

## 2017-01-26 ENCOUNTER — Telehealth: Payer: Self-pay | Admitting: Family Medicine

## 2017-01-26 DIAGNOSIS — Z79899 Other long term (current) drug therapy: Secondary | ICD-10-CM

## 2017-01-26 DIAGNOSIS — Z125 Encounter for screening for malignant neoplasm of prostate: Secondary | ICD-10-CM

## 2017-01-26 DIAGNOSIS — Z13220 Encounter for screening for lipoid disorders: Secondary | ICD-10-CM

## 2017-01-26 DIAGNOSIS — E119 Type 2 diabetes mellitus without complications: Secondary | ICD-10-CM

## 2017-01-26 NOTE — Telephone Encounter (Signed)
Patient needs order for labwork put in the system. Has appt on 01/30/17.

## 2017-01-27 NOTE — Telephone Encounter (Signed)
Patient is aware orders have been sent.

## 2017-01-27 NOTE — Telephone Encounter (Signed)
I would recommend lipid, liver, metabolic 7, X5E, PSA

## 2017-01-28 DIAGNOSIS — Z13220 Encounter for screening for lipoid disorders: Secondary | ICD-10-CM | POA: Diagnosis not present

## 2017-01-28 DIAGNOSIS — Z125 Encounter for screening for malignant neoplasm of prostate: Secondary | ICD-10-CM | POA: Diagnosis not present

## 2017-01-28 DIAGNOSIS — Z79899 Other long term (current) drug therapy: Secondary | ICD-10-CM | POA: Diagnosis not present

## 2017-01-28 DIAGNOSIS — E119 Type 2 diabetes mellitus without complications: Secondary | ICD-10-CM | POA: Diagnosis not present

## 2017-01-29 LAB — HEPATIC FUNCTION PANEL
ALK PHOS: 55 IU/L (ref 39–117)
ALT: 47 IU/L — AB (ref 0–44)
AST: 31 IU/L (ref 0–40)
Albumin: 4.7 g/dL (ref 3.5–5.5)
Bilirubin Total: 0.7 mg/dL (ref 0.0–1.2)
Bilirubin, Direct: 0.13 mg/dL (ref 0.00–0.40)
Total Protein: 7.4 g/dL (ref 6.0–8.5)

## 2017-01-29 LAB — BASIC METABOLIC PANEL
BUN/Creatinine Ratio: 9 (ref 9–20)
BUN: 11 mg/dL (ref 6–24)
CALCIUM: 10.1 mg/dL (ref 8.7–10.2)
CHLORIDE: 97 mmol/L (ref 96–106)
CO2: 27 mmol/L (ref 20–29)
Creatinine, Ser: 1.17 mg/dL (ref 0.76–1.27)
GFR calc non Af Amer: 70 mL/min/{1.73_m2} (ref >59)
GFR, EST AFRICAN AMERICAN: 81 mL/min/{1.73_m2} (ref >59)
Glucose: 91 mg/dL (ref 65–99)
POTASSIUM: 3.8 mmol/L (ref 3.5–5.2)
SODIUM: 141 mmol/L (ref 134–144)

## 2017-01-29 LAB — LIPID PANEL
CHOLESTEROL TOTAL: 155 mg/dL (ref 100–199)
Chol/HDL Ratio: 4.7 ratio (ref 0.0–5.0)
HDL: 33 mg/dL — ABNORMAL LOW (ref >39)
LDL CALC: 70 mg/dL (ref 0–99)
TRIGLYCERIDES: 259 mg/dL — AB (ref 0–149)
VLDL CHOLESTEROL CAL: 52 mg/dL — AB (ref 5–40)

## 2017-01-29 LAB — PSA: PROSTATE SPECIFIC AG, SERUM: 0.2 ng/mL (ref 0.0–4.0)

## 2017-01-29 LAB — HEMOGLOBIN A1C
ESTIMATED AVERAGE GLUCOSE: 171 mg/dL
Hgb A1c MFr Bld: 7.6 % — ABNORMAL HIGH (ref 4.8–5.6)

## 2017-01-30 ENCOUNTER — Other Ambulatory Visit: Payer: Self-pay | Admitting: Family Medicine

## 2017-01-30 ENCOUNTER — Ambulatory Visit: Payer: BLUE CROSS/BLUE SHIELD | Admitting: Family Medicine

## 2017-01-30 ENCOUNTER — Encounter: Payer: Self-pay | Admitting: Family Medicine

## 2017-01-30 VITALS — BP 148/80 | Ht 70.0 in | Wt 205.0 lb

## 2017-01-30 DIAGNOSIS — Z23 Encounter for immunization: Secondary | ICD-10-CM

## 2017-01-30 DIAGNOSIS — R809 Proteinuria, unspecified: Secondary | ICD-10-CM | POA: Diagnosis not present

## 2017-01-30 DIAGNOSIS — G8929 Other chronic pain: Secondary | ICD-10-CM

## 2017-01-30 DIAGNOSIS — E1129 Type 2 diabetes mellitus with other diabetic kidney complication: Secondary | ICD-10-CM | POA: Diagnosis not present

## 2017-01-30 DIAGNOSIS — M2550 Pain in unspecified joint: Secondary | ICD-10-CM | POA: Diagnosis not present

## 2017-01-30 DIAGNOSIS — M25572 Pain in left ankle and joints of left foot: Secondary | ICD-10-CM | POA: Diagnosis not present

## 2017-01-30 DIAGNOSIS — I10 Essential (primary) hypertension: Secondary | ICD-10-CM | POA: Diagnosis not present

## 2017-01-30 DIAGNOSIS — E7849 Other hyperlipidemia: Secondary | ICD-10-CM

## 2017-01-30 MED ORDER — INDAPAMIDE 2.5 MG PO TABS: 2.5000 mg | ORAL_TABLET | Freq: Every day | ORAL | 5 refills | Status: DC

## 2017-01-30 MED ORDER — OXYCODONE-ACETAMINOPHEN 10-325 MG PO TABS: ORAL_TABLET | ORAL | 0 refills | Status: DC

## 2017-01-30 MED ORDER — OXYCODONE-ACETAMINOPHEN 10-325 MG PO TABS
ORAL_TABLET | ORAL | 0 refills | Status: DC
Start: 2017-01-30 — End: 2017-01-30

## 2017-01-30 MED ORDER — LOSARTAN POTASSIUM 100 MG PO TABS: 100.0000 mg | ORAL_TABLET | Freq: Every day | ORAL | 5 refills | Status: DC

## 2017-01-30 NOTE — Progress Notes (Signed)
   Subjective:    Patient ID: Terry [name], male    DOB: 1960-10-19, 56 y.o.   MRN: [id number]  Diabetes  He presents for his follow-up diabetic visit. He has type 2 diabetes mellitus. Pertinent negatives for hypoglycemia include no confusion. Pertinent negatives for diabetes include no chest pain, no fatigue, no polydipsia, no polyphagia and no weakness. He is compliant with treatment all of the time. Home blood sugar record trend: around 110.  A1C on bloodwork. 7.6.  Cyst on left side of neck. Has seen dermatology. They wanted to remove it. Pt was worried about a scar.  Patient relates that dermatologist recommended taking a deep cut into his neck in order to remove it he states he is thinking about seeing a general surgeon for this  Flu vaccine today.   He has significant chronic arthralgias takes pain medicine to help it out cannot take anti-inflammatories because of diabetes kidney function and proteinuria patient denies abusing the pain medicine pain registry was checked  Patient takes his blood pressure medicine regular basis denies any problems with blood pressure recently  Patient does take his cholesterol medicine regular basis-tolerates it well no side effects.  Review of Systems  Constitutional: Negative for activity change, appetite change and fatigue.  HENT: Negative for congestion.   Respiratory: Negative for cough.   Cardiovascular: Negative for chest pain.  Gastrointestinal: Negative for abdominal pain.  Endocrine: Negative for polydipsia and polyphagia.  Neurological: Negative for weakness.  Psychiatric/Behavioral: Negative for confusion.   25 minutes was spent with the patient. Greater than half the time was spent in discussion and answering questions and counseling regarding the issues that the patient came in for today.     Objective:   Physical Exam  Constitutional: He appears well-nourished. No distress.  Cardiovascular: Normal rate, regular rhythm and normal  heart sounds.  No murmur heard. Pulmonary/Chest: Effort normal and breath sounds normal. No respiratory distress.  Musculoskeletal: He exhibits no edema.  Lymphadenopathy:    He has no cervical adenopathy.  Neurological: He is alert.  Psychiatric: His behavior is normal.  Vitals reviewed.         Assessment & Plan:  Diabetes-control could be a little bit better but it is not terrible.  We discussed measures to try to improve short acting insulin with meals recheck this again in 3 months time  Chronic pain and discomfort in the ankle as well as joints uses oxycodone 4 times per day cannot use NSAIDs because of diabetes with proteinuria drug registry was checked patient only uses 4/day 3 prescriptions were given  Hyperlipidemia triglycerides are improved LDL control is good continue cholesterol medicine  Blood pressure subpar control increased diuretic effect by going to indapamide 2.5 mg stop Hyzaar will use losartan 100 mg if blood pressures are not doing as well as expected will increase metoprolol  Follow-up in 3 months

## 2017-01-30 NOTE — Patient Instructions (Signed)
Stop Hyzaar  Use losartan 100 mg 1 daily  Use indapamide 2.5 mg 1 daily   send [place] some blood pressure readings and sugar readings in Several weeks   Follow-up here in 3 months

## 2017-02-03 ENCOUNTER — Encounter: Payer: Self-pay | Admitting: Family Medicine

## 2017-03-01 ENCOUNTER — Other Ambulatory Visit (INDEPENDENT_AMBULATORY_CARE_PROVIDER_SITE_OTHER): Payer: Self-pay | Admitting: *Deleted

## 2017-03-01 DIAGNOSIS — Z1211 Encounter for screening for malignant neoplasm of colon: Secondary | ICD-10-CM | POA: Insufficient documentation

## 2017-03-17 ENCOUNTER — Other Ambulatory Visit: Payer: Self-pay | Admitting: Family Medicine

## 2017-03-23 ENCOUNTER — Other Ambulatory Visit: Payer: Self-pay | Admitting: *Deleted

## 2017-03-23 ENCOUNTER — Other Ambulatory Visit: Payer: Self-pay | Admitting: Family Medicine

## 2017-03-23 ENCOUNTER — Telehealth: Payer: Self-pay | Admitting: Family Medicine

## 2017-03-23 MED ORDER — LOSARTAN POTASSIUM 100 MG PO TABS: 100.0000 mg | ORAL_TABLET | Freq: Every day | ORAL | 0 refills | Status: DC

## 2017-03-23 MED ORDER — INDAPAMIDE 2.5 MG PO TABS: 2.5000 mg | ORAL_TABLET | Freq: Every day | ORAL | 0 refills | Status: DC

## 2017-03-23 NOTE — Telephone Encounter (Signed)
Patient notified

## 2017-03-23 NOTE — Telephone Encounter (Signed)
Requesting Rx for 90 day supply of Indapamide 2.5 mg and losartan 100 mg to [facility].

## 2017-03-23 NOTE — Telephone Encounter (Signed)
90 day supply sent to pharm per dr Nicki Reaper.

## 2017-04-17 ENCOUNTER — Other Ambulatory Visit: Payer: Self-pay | Admitting: Family Medicine

## 2017-05-01 ENCOUNTER — Telehealth: Payer: Self-pay | Admitting: *Deleted

## 2017-05-01 ENCOUNTER — Encounter: Payer: Self-pay | Admitting: Family Medicine

## 2017-05-01 ENCOUNTER — Ambulatory Visit: Payer: BLUE CROSS/BLUE SHIELD | Admitting: Family Medicine

## 2017-05-01 ENCOUNTER — Other Ambulatory Visit: Payer: Self-pay | Admitting: Family Medicine

## 2017-05-01 VITALS — BP 132/76 | Ht 70.0 in | Wt 203.0 lb

## 2017-05-01 DIAGNOSIS — K760 Fatty (change of) liver, not elsewhere classified: Secondary | ICD-10-CM | POA: Diagnosis not present

## 2017-05-01 DIAGNOSIS — F320 Major depressive disorder, single episode, mild: Secondary | ICD-10-CM

## 2017-05-01 DIAGNOSIS — M2550 Pain in unspecified joint: Secondary | ICD-10-CM | POA: Diagnosis not present

## 2017-05-01 DIAGNOSIS — E119 Type 2 diabetes mellitus without complications: Secondary | ICD-10-CM

## 2017-05-01 DIAGNOSIS — E782 Mixed hyperlipidemia: Secondary | ICD-10-CM

## 2017-05-01 DIAGNOSIS — Z79891 Long term (current) use of opiate analgesic: Secondary | ICD-10-CM | POA: Diagnosis not present

## 2017-05-01 DIAGNOSIS — I10 Essential (primary) hypertension: Secondary | ICD-10-CM | POA: Diagnosis not present

## 2017-05-01 DIAGNOSIS — R21 Rash and other nonspecific skin eruption: Secondary | ICD-10-CM | POA: Diagnosis not present

## 2017-05-01 LAB — POCT GLYCOSYLATED HEMOGLOBIN (HGB A1C): Hemoglobin A1C: 7.8

## 2017-05-01 MED ORDER — OXYCODONE-ACETAMINOPHEN 10-325 MG PO TABS: ORAL_TABLET | ORAL | 0 refills | Status: DC

## 2017-05-01 MED ORDER — SERTRALINE HCL 50 MG PO TABS: 50.0000 mg | ORAL_TABLET | Freq: Every day | ORAL | 5 refills | Status: DC

## 2017-05-01 MED ORDER — MOMETASONE FUROATE 0.1 % EX CREA: 1.0000 "application " | TOPICAL_CREAM | Freq: Two times a day (BID) | CUTANEOUS | 3 refills | Status: DC | PRN

## 2017-05-01 NOTE — Telephone Encounter (Signed)
Pt seen today and wanted refill on triamcinolone cream for rash on legs. Dr Nicki Reaper was in a different room and I told pt I would ask dr and call  Him back. Consult with dr Nicki Reaper and dr Nicki Reaper wanted to prescribe something stronger.  change to elocon cream apply bid prn 45 g with 3 refills. Med sent to pharm. Left message with pt to notify him of the change in creams.

## 2017-05-01 NOTE — Progress Notes (Signed)
Subjective:    Patient ID: Terry [name], male    DOB: 16-Jul-1960, 57 y.o.   MRN: [id number]  Diabetes  He presents for his follow-up diabetic visit. He has type 2 diabetes mellitus. Pertinent negatives for hypoglycemia include no confusion or headaches. Pertinent negatives for diabetes include no chest pain, no fatigue, no polydipsia, no polyphagia and no weakness. Home blood sugar record trend: some highs and lows but overall good. Eye exam is current.   Itchy rash on left leg. Comes and goes for years. Needs refill triamcinolone cream.  Patient states the medication seems to help but not as much as he would like  He also relates a firm area in the skin above this area that is approximately 1 inch x 1 inch in diameter not tender no pain with it states is been present for several weeks does not know of what is causing it denies any injury to it  This patient was seen today for chronic pain. Takes for hip pain, knee pain and ankle pain the patient relates chronic hip pain knee pain back pain ankle pain also relates a lot of aching and joint discomforts worse when he has an intense day at work he works as a Games developer pain medicine does help take the edge off the pain let him to function better  The medication list was reviewed and updated.   -Compliance with medication: yes  - Number patient states they take daily: 4  -when was the last dose patient took? yesterday  The patient was advised the importance of maintaining medication and not using illegal substances with these.  Here for refills and follow up  The patient was educated that we can provide 3 monthly scripts for their medication, it is their responsibility to follow the instructions.  Side effects or complications from medications: none  Patient is aware that pain medications are meant to minimize the severity of the pain to allow their pain levels to improve to allow for better function. They are aware of that pain  medications cannot totally remove their pain.  Due for UDT ( at least once per year) : due today.   Concerns about anxiety.  Patient has been dealing with anxiousness as well as feeling sad and somewhat depressed for his future he is concerned about his underlying health issues and wonders if he can keep working despite all of these symptoms.  He denies any suicidal or homicidal ideation.  New onset depression with some anxiety discussed  Results for orders placed or performed in visit on 05/01/17  POCT glycosylated hemoglobin (Hb A1C)  Result Value Ref Range   Hemoglobin A1C 7.8     Patient here for follow-up regarding cholesterol.  Patient does try to maintain a reasonable diet.  Patient does take the medication on a regular basis.  Denies missing a dose.  The patient denies any obvious side effects.  Prior blood work results reviewed with the patient.  The patient is aware of his cholesterol goals and the need to keep it under good control to lessen the risk of disease.  Patient for blood pressure check up. Patient relates compliance with meds. Todays BP reviewed with the patient. Patient denies issues with medication. Patient relates reasonable diet. Patient tries to minimize salt. Patient aware of BP goals.        Review of Systems  Constitutional: Negative for activity change, appetite change and fatigue.  HENT: Negative for congestion and rhinorrhea.   Respiratory: Negative for  cough, chest tightness and shortness of breath.   Cardiovascular: Negative for chest pain and leg swelling.  Gastrointestinal: Negative for abdominal pain, diarrhea and nausea.  Endocrine: Negative for polydipsia and polyphagia.  Genitourinary: Negative for dysuria and hematuria.  Neurological: Negative for weakness and headaches.  Psychiatric/Behavioral: Negative for confusion and dysphoric mood.       Objective:   Physical Exam  Constitutional: He appears well-nourished. No distress.  HENT:  Head:  Normocephalic and atraumatic.  Eyes: Right eye exhibits no discharge. Left eye exhibits no discharge.  Neck: No tracheal deviation present.  Cardiovascular: Normal rate, regular rhythm and normal heart sounds.  No murmur heard. Pulmonary/Chest: Effort normal and breath sounds normal. No respiratory distress. He has no wheezes.  Musculoskeletal: He exhibits no edema.  Lymphadenopathy:    He has no cervical adenopathy.  Neurological: He is alert.  Skin: Skin is warm. No rash noted.  Psychiatric: His behavior is normal.  Vitals reviewed.    25 minutes was spent with the patient.  This statement verifies that 25 minutes was indeed spent with the patient. Greater than half the time was spent in discussion, counseling and answering questions  regarding the issues that the patient came in for today as reflected in the diagnosis (s) please refer to documentation for further details.       Assessment & Plan:  1. Diabetes mellitus without complication ([place]) Subpar control watch diet adjust medication reaction patient encouraged to do better job with dietary measures.  Guarded prognosis unless he gets his readings better he will send [place] readings in the next several weeks - POCT glycosylated hemoglobin (Hb A1C) - Hemoglobin A1c  2. Encounter for long-term opiate analgesic use Pain medicine he uses it responsibly drug registry was checked it helps him function continue 3 prescriptions given - ToxASSURE Select 13 (MW), Urine  3. Essential hypertension, benign Blood pressure good control check labs before next visit watch diet take medication exercise keep salt and check - Basic metabolic panel  4. Mixed hyperlipidemia Watch diet minimize bad fats healthy eating continue medication importance of meeting his goals were discussed to reduce risk of heart attack strokes long-term prognosis guarded because of multiple risk factors - Lipid panel  5. Depression, major, single episode, mild  ([place]) Significant depression would benefit from medication we did discuss possibility of counseling he defers on this he also states he is willing to take medication patient is not a candidate for nerve medicine.  Recommend Zoloft 50 mg 1/2 tablet daily for the first week then 1 tablet daily thereafter if he becomes suicidal or homicidal he needs a follow-up immediately here or ER  6. Arthralgia, unspecified joint Significant joint discomforts and pain more than likely related to his work but we will check sed rate and rheumatoid factor - Sedimentation rate - Rheumatoid factor  7. Fatty liver Fatty liver liver enzymes slightly monitor closely - Hepatic function panel  Referral to dermatology because skin subcutaneous lesion not sure if they will recommend a biopsy or MRI  Dermatitis in the legs recommend Elocon cream twice daily as needed  Skin lesion subcutaneous referral to Derm as discussed above left leg

## 2017-05-01 NOTE — Telephone Encounter (Signed)
Referral to dermatology placed in epic.

## 2017-05-01 NOTE — Progress Notes (Signed)
I put the referral in. Pt has a message in pt calls also.

## 2017-05-02 NOTE — Progress Notes (Signed)
Patient is aware 

## 2017-05-02 NOTE — Telephone Encounter (Signed)
Patient is aware of all. 

## 2017-05-05 LAB — TOXASSURE SELECT 13 (MW), URINE

## 2017-05-09 ENCOUNTER — Encounter: Payer: Self-pay | Admitting: Family Medicine

## 2017-05-23 DIAGNOSIS — I8312 Varicose veins of left lower extremity with inflammation: Secondary | ICD-10-CM | POA: Diagnosis not present

## 2017-05-23 DIAGNOSIS — I872 Venous insufficiency (chronic) (peripheral): Secondary | ICD-10-CM | POA: Diagnosis not present

## 2017-05-23 DIAGNOSIS — I8311 Varicose veins of right lower extremity with inflammation: Secondary | ICD-10-CM | POA: Diagnosis not present

## 2017-05-25 ENCOUNTER — Other Ambulatory Visit: Payer: Self-pay | Admitting: Family Medicine

## 2017-05-26 ENCOUNTER — Telehealth (INDEPENDENT_AMBULATORY_CARE_PROVIDER_SITE_OTHER): Payer: Self-pay | Admitting: *Deleted

## 2017-05-26 ENCOUNTER — Encounter (INDEPENDENT_AMBULATORY_CARE_PROVIDER_SITE_OTHER): Payer: Self-pay | Admitting: *Deleted

## 2017-05-26 NOTE — Telephone Encounter (Signed)
Patient needs trilyte 

## 2017-05-29 ENCOUNTER — Encounter (INDEPENDENT_AMBULATORY_CARE_PROVIDER_SITE_OTHER): Payer: Self-pay | Admitting: *Deleted

## 2017-05-29 MED ORDER — PEG 3350-KCL-NA BICARB-NACL 420 G PO SOLR: 4000.0000 mL | Freq: Once | ORAL | 0 refills | Status: AC

## 2017-06-06 ENCOUNTER — Telehealth (INDEPENDENT_AMBULATORY_CARE_PROVIDER_SITE_OTHER): Payer: Self-pay | Admitting: *Deleted

## 2017-06-06 NOTE — Telephone Encounter (Signed)
Patient is wanting to have egd/ed along with his TCS for dyspghagia--Dr. Sallee Lange was to have sent this over to see if we could add this to his scheduled procedure on 07/12/2017.  She said she had called in few weeks ago and had not heard back.  Please call   [phone]

## 2017-06-06 NOTE — Telephone Encounter (Signed)
If he is having dysphagia, he needs OV

## 2017-06-06 NOTE — Telephone Encounter (Signed)
Needs OV for dysphagia

## 2017-06-07 NOTE — Telephone Encounter (Signed)
I have called patient no answer left voicemail. I scheduled patient for 3/25 at this time and mailed a letter, if patient calls back I can get him in sooner.

## 2017-06-19 ENCOUNTER — Ambulatory Visit (INDEPENDENT_AMBULATORY_CARE_PROVIDER_SITE_OTHER): Payer: BLUE CROSS/BLUE SHIELD | Admitting: Internal Medicine

## 2017-06-19 ENCOUNTER — Encounter (INDEPENDENT_AMBULATORY_CARE_PROVIDER_SITE_OTHER): Payer: Self-pay | Admitting: Internal Medicine

## 2017-06-19 VITALS — BP 150/82 | HR 80 | Temp 98.3°F | Ht 70.0 in | Wt 204.1 lb

## 2017-06-19 DIAGNOSIS — R1310 Dysphagia, unspecified: Secondary | ICD-10-CM | POA: Diagnosis not present

## 2017-06-19 DIAGNOSIS — Z1211 Encounter for screening for malignant neoplasm of colon: Secondary | ICD-10-CM | POA: Diagnosis not present

## 2017-06-19 DIAGNOSIS — R1319 Other dysphagia: Secondary | ICD-10-CM | POA: Insufficient documentation

## 2017-06-19 NOTE — Progress Notes (Signed)
Subjective:    Patient ID: Terry [name], male    DOB: 29-Jul-1960, 57 y.o.   MRN: [id number]  HPI Here today for f/u.he is scheduled for a colonoscopy. He tells me he is having trouble swallowing. Foods are lodging. Three times he had to vomit the bolus up. Noticed in August. Occurs daily. Thick foods bother him. Steak and chicken will lodge. He was advised to take small bites. Breads even bother him. No GERD. Appetite is good. No weight loss.  Takes Oxycodone qid for arthritis.   He is self employed. Makes cabinet.    Review of Systems Past Medical History:  Diagnosis Date  . Diabetes mellitus without complication ([place]) 5053  . Hyperlipidemia   . Hypertension     Past Surgical History:  Procedure Laterality Date  . ANKLE SURGERY Left 2007   2 plates/screws    Allergies  Allergen Reactions  . Altace [Ramipril] Cough  . Invokana [Canagliflozin]     laryngitis  . Metformin And Related     diarrhea    Current Outpatient Medications on File Prior to Visit  Medication Sig Dispense Refill  . amLODipine (NORVASC) 10 MG tablet TAKE (1) TABLET BY MOUTH ONCE DAILY. 90 tablet 1  . aspirin EC 81 MG tablet Take 81 mg daily by mouth.    Marland Kitchen atorvastatin (LIPITOR) 80 MG tablet TAKE (1) TABLET BY MOUTH ONCE DAILY. 90 tablet 0  . HUMALOG KWIKPEN 200 UNIT/ML SOPN INJECT 140-150 UNITS INTO SKIN DAILY. 18 mL 0  . indapamide (LOZOL) 2.5 MG tablet Take 1 tablet (2.5 mg total) by mouth daily. 90 tablet 0  . insulin lispro (HUMALOG) 100 UNIT/ML injection Inject 140-150 units into the skin daily. 45 mL 5  . LANTUS SOLOSTAR 100 UNIT/ML Solostar Pen INJECT 90 UNITS TWICE DAILY. TITRATE TO 200 UNITS A DAY. 60 mL 5  . losartan (COZAAR) 100 MG tablet Take 1 tablet (100 mg total) by mouth daily. 90 tablet 0  . metoprolol succinate (TOPROL-XL) 25 MG 24 hr tablet TAKE (1) TABLET BY MOUTH ONCE DAILY. 90 tablet 0  . mometasone (ELOCON) 0.1 % cream Apply 1 application topically 2 (two) times daily as  needed. 45 g 3  . Multiple Vitamin (MULTIVITAMIN) tablet Take 1 tablet by mouth daily.    . Omega-3 Fatty Acids (FISH OIL) 1200 MG CAPS Take by mouth. One every day    . oxyCODONE-acetaminophen (PERCOCET) 10-325 MG tablet Take 1 tablet by mouth four times a day as needed for pain.( Must last 30 days) 120 tablet 0  . sertraline (ZOLOFT) 50 MG tablet Take 1 tablet (50 mg total) by mouth daily. 30 tablet 5  . sildenafil (REVATIO) 20 MG tablet Take 1-5 tablets before sexual activity as directed. No greater that 5 tablets in a day. 25 tablet 6  . VICTOZA 18 MG/3ML SOPN INJECT 1.8MG  SUBCUTANEOUSLY ONCE DAILY. 9 mL 5  . CONTOUR NEXT TEST test strip TEST BLOOD SUGAR 4 TIMES DAILY. 100 each 5   No current facility-administered medications on file prior to visit.         Objective:   Physical Exam Blood pressure (!) 150/82, pulse 80, temperature 98.3 F (36.8 C), height 5\' 10"  (1.778 m), weight 204 lb 1.6 oz (92.6 kg). Alert and oriented. Skin warm and dry. Oral mucosa is moist.   . Sclera anicteric, conjunctivae is pink. Thyroid not enlarged. No cervical lymphadenopathy. Lungs clear. Heart regular rate and rhythm.  Abdomen is soft. Bowel sounds are positive.  No hepatomegaly. No abdominal masses felt. No tenderness.  No edema to lower extremities.           Assessment & Plan:  Dysphagia. Esophageal stricture needs to be ruled out. EGD/ED.

## 2017-06-19 NOTE — Patient Instructions (Signed)
The risks of bleeding, perforation and infection were reviewed with patient.  

## 2017-06-21 ENCOUNTER — Telehealth (INDEPENDENT_AMBULATORY_CARE_PROVIDER_SITE_OTHER): Payer: Self-pay | Admitting: *Deleted

## 2017-06-21 NOTE — Telephone Encounter (Signed)
Referring MD/PCP: scott luking   Procedure: tcs  Reason/Indication:  screening  Has patient had this procedure before?  no  If so, when, by whom and where?    Is there a family history of colon cancer?  no  Who?  What age when diagnosed?    Is patient diabetic?   yes      Does patient have prosthetic heart valve or mechanical valve?  no  Do you have a pacemaker?  no  Has patient ever had endocarditis? no  Has patient had joint replacement within last 12 months?  no  Is patient constipated or do they take laxatives? no  Does patient have a history of alcohol/drug use?  no  Is patient on blood thinner such as Coumadin, Plavix and/or Aspirin? yes  Medications: see epic  Allergies: see epic  Medication Adjustment per Dr Lindi Adie, NP: asa 2 days, lantus 45 units in am & 30 units in pm day before, hold victosa day of  Procedure date & time: 07/12/17 at 100

## 2017-06-21 NOTE — Telephone Encounter (Signed)
agree

## 2017-06-28 ENCOUNTER — Other Ambulatory Visit: Payer: Self-pay | Admitting: Family Medicine

## 2017-07-12 ENCOUNTER — Encounter (HOSPITAL_COMMUNITY): Admission: RE | Payer: Self-pay | Source: Ambulatory Visit

## 2017-07-12 ENCOUNTER — Ambulatory Visit (HOSPITAL_COMMUNITY)
Admission: RE | Admit: 2017-07-12 | Payer: BLUE CROSS/BLUE SHIELD | Source: Ambulatory Visit | Admitting: Internal Medicine

## 2017-07-12 SURGERY — COLONOSCOPY
Anesthesia: Moderate Sedation

## 2017-07-28 ENCOUNTER — Other Ambulatory Visit: Payer: Self-pay | Admitting: Family Medicine

## 2017-07-28 DIAGNOSIS — K760 Fatty (change of) liver, not elsewhere classified: Secondary | ICD-10-CM | POA: Diagnosis not present

## 2017-07-28 DIAGNOSIS — E782 Mixed hyperlipidemia: Secondary | ICD-10-CM | POA: Diagnosis not present

## 2017-07-28 DIAGNOSIS — E119 Type 2 diabetes mellitus without complications: Secondary | ICD-10-CM | POA: Diagnosis not present

## 2017-07-28 DIAGNOSIS — I10 Essential (primary) hypertension: Secondary | ICD-10-CM | POA: Diagnosis not present

## 2017-07-29 LAB — LIPID PANEL
Chol/HDL Ratio: 5 ratio (ref 0.0–5.0)
Cholesterol, Total: 161 mg/dL (ref 100–199)
HDL: 32 mg/dL — AB (ref >39)
LDL CALC: 84 mg/dL (ref 0–99)
Triglycerides: 224 mg/dL — ABNORMAL HIGH (ref 0–149)
VLDL CHOLESTEROL CAL: 45 mg/dL — AB (ref 5–40)

## 2017-07-29 LAB — BASIC METABOLIC PANEL
BUN/Creatinine Ratio: 17 (ref 9–20)
BUN: 19 mg/dL (ref 6–24)
CALCIUM: 10 mg/dL (ref 8.7–10.2)
CHLORIDE: 99 mmol/L (ref 96–106)
CO2: 26 mmol/L (ref 20–29)
CREATININE: 1.14 mg/dL (ref 0.76–1.27)
GFR, EST AFRICAN AMERICAN: 83 mL/min/{1.73_m2} (ref >59)
GFR, EST NON AFRICAN AMERICAN: 72 mL/min/{1.73_m2} (ref >59)
Glucose: 53 mg/dL — ABNORMAL LOW (ref 65–99)
Potassium: 4.1 mmol/L (ref 3.5–5.2)
Sodium: 141 mmol/L (ref 134–144)

## 2017-07-29 LAB — HEPATIC FUNCTION PANEL
ALK PHOS: 53 IU/L (ref 39–117)
ALT: 43 IU/L (ref 0–44)
AST: 32 IU/L (ref 0–40)
Albumin: 4.9 g/dL (ref 3.5–5.5)
Bilirubin Total: 0.5 mg/dL (ref 0.0–1.2)
Bilirubin, Direct: 0.11 mg/dL (ref 0.00–0.40)
Total Protein: 7.7 g/dL (ref 6.0–8.5)

## 2017-07-29 LAB — SEDIMENTATION RATE: SED RATE: 18 mm/h (ref 0–30)

## 2017-07-29 LAB — RHEUMATOID FACTOR: Rhuematoid fact SerPl-aCnc: 10.3 IU/mL (ref 0.0–13.9)

## 2017-07-29 LAB — HEMOGLOBIN A1C
ESTIMATED AVERAGE GLUCOSE: 171 mg/dL
HEMOGLOBIN A1C: 7.6 % — AB (ref 4.8–5.6)

## 2017-07-31 ENCOUNTER — Telehealth (INDEPENDENT_AMBULATORY_CARE_PROVIDER_SITE_OTHER): Payer: Self-pay | Admitting: Internal Medicine

## 2017-07-31 ENCOUNTER — Encounter: Payer: Self-pay | Admitting: Family Medicine

## 2017-07-31 ENCOUNTER — Ambulatory Visit: Payer: BLUE CROSS/BLUE SHIELD | Admitting: Family Medicine

## 2017-07-31 VITALS — BP 144/84 | Ht 70.0 in | Wt 200.4 lb

## 2017-07-31 DIAGNOSIS — M1990 Unspecified osteoarthritis, unspecified site: Secondary | ICD-10-CM | POA: Diagnosis not present

## 2017-07-31 DIAGNOSIS — E782 Mixed hyperlipidemia: Secondary | ICD-10-CM | POA: Diagnosis not present

## 2017-07-31 DIAGNOSIS — I10 Essential (primary) hypertension: Secondary | ICD-10-CM

## 2017-07-31 DIAGNOSIS — E1129 Type 2 diabetes mellitus with other diabetic kidney complication: Secondary | ICD-10-CM | POA: Diagnosis not present

## 2017-07-31 DIAGNOSIS — R809 Proteinuria, unspecified: Secondary | ICD-10-CM | POA: Diagnosis not present

## 2017-07-31 MED ORDER — OXYCODONE-ACETAMINOPHEN 10-325 MG PO TABS: ORAL_TABLET | ORAL | 0 refills | Status: DC

## 2017-07-31 NOTE — Telephone Encounter (Signed)
Wife [name] called stated patient had to cancel his colonoscopy recently and has not heard anything about rescheduling- please call [phone]

## 2017-07-31 NOTE — Telephone Encounter (Signed)
TCS sch'd 08/04/17, patient wife aware

## 2017-07-31 NOTE — Progress Notes (Signed)
Subjective:    Patient ID: Terry [name], male    DOB: 1960/04/15, 57 y.o.   MRN: [id number]  Diabetes  He presents for his follow-up diabetic visit. He has type 2 diabetes mellitus. Pertinent negatives for hypoglycemia include no confusion or headaches. Pertinent negatives for diabetes include no chest pain, no fatigue, no polydipsia, no polyphagia and no weakness. Risk factors for coronary artery disease include diabetes mellitus, dyslipidemia and hypertension. Current diabetic treatment includes insulin injections. He is compliant with treatment all of the time. His weight is stable. He is following a diabetic diet. He has not had a previous visit with a dietitian. He does not see a podiatrist.Eye exam is current.   This patient was seen today for chronic pain  The medication list was reviewed and updated.   -Compliance with medication: Patient takes medicine as directed  - Number patient states they take daily: Takes for daily  -when was the last dose patient took?  Earlier today  The patient was advised the importance of maintaining medication and not using illegal substances with these.  Here for refills and follow up  The patient was educated that we can provide 3 monthly scripts for their medication, it is their responsibility to follow the instructions.  Side effects or complications from medications: Denies any negative side effects from the medicine does help minimize his pain allows him to be more active very stiff pain without medicine  Patient is aware that pain medications are meant to minimize the severity of the pain to allow their pain levels to improve to allow for better function. They are aware of that pain medications cannot totally remove their pain.  Due for UDT ( at least once per year) : Later this year    Patient here for follow-up regarding cholesterol.  Patient does try to maintain a reasonable diet.  Patient does take the medication on a regular basis.   Denies missing a dose.  The patient denies any obvious side effects.  Prior blood work results reviewed with the patient.  The patient is aware of his cholesterol goals and the need to keep it under good control to lessen the risk of disease.   is patient is here today regarding follow-up regarding depression.  Patient relates compliance with medication.  Patient understands importance of taking the medication.  Patient denies any significant side effects.  Patient relates that the medication is still beneficial for them and they would like to continue the medication.  Patient is not having any threats of homicide or suicide.   Discuss recent labs  Review of Systems  Constitutional: Negative for activity change, appetite change and fatigue.  HENT: Negative for congestion and rhinorrhea.   Respiratory: Negative for cough, chest tightness and shortness of breath.   Cardiovascular: Negative for chest pain and leg swelling.  Gastrointestinal: Negative for abdominal pain, diarrhea and nausea.  Endocrine: Negative for polydipsia and polyphagia.  Genitourinary: Negative for dysuria and hematuria.  Neurological: Negative for weakness and headaches.  Psychiatric/Behavioral: Negative for confusion and dysphoric mood.       Objective:   Physical Exam  Constitutional: He appears well-nourished. No distress.  Cardiovascular: Normal rate, regular rhythm and normal heart sounds.  No murmur heard. Pulmonary/Chest: Effort normal and breath sounds normal. No respiratory distress.  Musculoskeletal: He exhibits no edema.  Lymphadenopathy:    He has no cervical adenopathy.  Neurological: He is alert.  Psychiatric: His behavior is normal.  Vitals reviewed.  Foot exam  normal       Assessment & Plan:  The patient was seen today as part of a comprehensive visit for diabetes. The importance of keeping her A1c at or below 7 was discussed.  Importance of regular physical activity was discussed.   The  importance of adherence to medication as well as a controlled low starch/sugar diet was also discussed.  Standard follow-up visit recommended.  Finally failure to follow good diabetic measures including self effort and compliance with recommendations can certainly increase the risk of heart disease strokes kidney failure blindness loss of limb and early death was discussed with the patient.  HTN- Patient was seen today as part of a visit regarding hypertension. The importance of healthy diet and regular physical activity was discussed. The importance of compliance with medications discussed.  Ideal goal is to keep blood pressure low elevated levels certainly below 453/64 when possible.  The patient was counseled that keeping blood pressure under control lessen his risk of heart attack, stroke, kidney failure, and early death.  The importance of regular follow-ups was discussed with the patient.  Low-salt diet such as DASH recommended. Regular physical activity was recommended as well.  Patient was advised to keep regular follow-ups.  The patient was seen in followup for chronic pain. A review over at their current pain status was discussed. Discussion was held regarding the importance of compliance with medication as well as pain medication contract. Discussion with the patient regarding the medication as it pertains to allowing for blunting of pain levels as well as improvement of function was completed. Questions regarding the pain management were answered. Importance of regular followup visits was discussed. Patient was informed that medication may cause drowsiness and should not be combined  with other medications/alcohol or street drugs. Patient was cautioned that drowsiness may impair ability to operate heavy machinery/vehicles/dangerous activities and should take this into consideration.  Drug registry was checked 3 prescriptions given follow-up in early August  Patient feels he is doing  well with antidepressant denies being suicidal  Patient with significant osteoarthritis of the joints mainly of the hips knees hands wrists

## 2017-08-04 ENCOUNTER — Encounter (HOSPITAL_COMMUNITY)
Admission: RE | Disposition: A | Discharge status: Home / Self Care | Payer: Self-pay | Source: Ambulatory Visit | Attending: Internal Medicine

## 2017-08-04 ENCOUNTER — Encounter (HOSPITAL_COMMUNITY): Payer: Self-pay | Admitting: Emergency Medicine

## 2017-08-04 ENCOUNTER — Ambulatory Visit (HOSPITAL_COMMUNITY)
Admission: RE | Admit: 2017-08-04 | Discharge: 2017-08-04 | Disposition: A | Discharge status: Home / Self Care | Payer: BLUE CROSS/BLUE SHIELD | Source: Ambulatory Visit | Attending: Internal Medicine | Admitting: Internal Medicine

## 2017-08-04 ENCOUNTER — Other Ambulatory Visit: Payer: Self-pay

## 2017-08-04 DIAGNOSIS — R1310 Dysphagia, unspecified: Secondary | ICD-10-CM | POA: Insufficient documentation

## 2017-08-04 DIAGNOSIS — Z79899 Other long term (current) drug therapy: Secondary | ICD-10-CM | POA: Insufficient documentation

## 2017-08-04 DIAGNOSIS — I10 Essential (primary) hypertension: Secondary | ICD-10-CM | POA: Insufficient documentation

## 2017-08-04 DIAGNOSIS — Z888 Allergy status to other drugs, medicaments and biological substances status: Secondary | ICD-10-CM | POA: Insufficient documentation

## 2017-08-04 DIAGNOSIS — D124 Benign neoplasm of descending colon: Secondary | ICD-10-CM | POA: Diagnosis not present

## 2017-08-04 DIAGNOSIS — K210 Gastro-esophageal reflux disease with esophagitis: Secondary | ICD-10-CM | POA: Insufficient documentation

## 2017-08-04 DIAGNOSIS — E119 Type 2 diabetes mellitus without complications: Secondary | ICD-10-CM | POA: Insufficient documentation

## 2017-08-04 DIAGNOSIS — Z7982 Long term (current) use of aspirin: Secondary | ICD-10-CM | POA: Diagnosis not present

## 2017-08-04 DIAGNOSIS — R1319 Other dysphagia: Secondary | ICD-10-CM | POA: Insufficient documentation

## 2017-08-04 DIAGNOSIS — K449 Diaphragmatic hernia without obstruction or gangrene: Secondary | ICD-10-CM | POA: Insufficient documentation

## 2017-08-04 DIAGNOSIS — R1314 Dysphagia, pharyngoesophageal phase: Secondary | ICD-10-CM | POA: Diagnosis not present

## 2017-08-04 DIAGNOSIS — Z794 Long term (current) use of insulin: Secondary | ICD-10-CM | POA: Insufficient documentation

## 2017-08-04 DIAGNOSIS — M1990 Unspecified osteoarthritis, unspecified site: Secondary | ICD-10-CM | POA: Diagnosis not present

## 2017-08-04 DIAGNOSIS — E785 Hyperlipidemia, unspecified: Secondary | ICD-10-CM | POA: Insufficient documentation

## 2017-08-04 DIAGNOSIS — Z87891 Personal history of nicotine dependence: Secondary | ICD-10-CM | POA: Insufficient documentation

## 2017-08-04 DIAGNOSIS — K222 Esophageal obstruction: Secondary | ICD-10-CM | POA: Insufficient documentation

## 2017-08-04 DIAGNOSIS — K635 Polyp of colon: Secondary | ICD-10-CM | POA: Insufficient documentation

## 2017-08-04 DIAGNOSIS — Z1211 Encounter for screening for malignant neoplasm of colon: Secondary | ICD-10-CM | POA: Insufficient documentation

## 2017-08-04 HISTORY — PX: ESOPHAGOGASTRODUODENOSCOPY: SHX5428

## 2017-08-04 HISTORY — PX: COLONOSCOPY: SHX5424

## 2017-08-04 HISTORY — PX: ESOPHAGEAL DILATION: SHX303

## 2017-08-04 HISTORY — DX: Unspecified osteoarthritis, unspecified site: M19.90

## 2017-08-04 HISTORY — PX: POLYPECTOMY: SHX5525

## 2017-08-04 LAB — GLUCOSE, CAPILLARY: GLUCOSE-CAPILLARY: 223 mg/dL — AB (ref 65–99)

## 2017-08-04 SURGERY — EGD (ESOPHAGOGASTRODUODENOSCOPY)
Anesthesia: Moderate Sedation

## 2017-08-04 MED ORDER — PROMETHAZINE HCL 25 MG/ML IJ SOLN
INTRAMUSCULAR | Status: DC | PRN
  Administered 2017-08-04: 12.5 mg via INTRAVENOUS

## 2017-08-04 MED ORDER — MIDAZOLAM HCL 5 MG/5ML IJ SOLN
INTRAMUSCULAR | Status: AC
  Filled 2017-08-04: qty 5

## 2017-08-04 MED ORDER — PROMETHAZINE HCL 25 MG/ML IJ SOLN: 12.5000 mg | Freq: Once | INTRAMUSCULAR | Status: DC

## 2017-08-04 MED ORDER — LIDOCAINE VISCOUS 2 % MT SOLN
OROMUCOSAL | Status: DC | PRN
  Administered 2017-08-04: 5 mL via OROMUCOSAL

## 2017-08-04 MED ORDER — SODIUM CHLORIDE BACTERIOSTATIC 0.9 % IJ SOLN
INTRAMUSCULAR | Status: AC
  Filled 2017-08-04: qty 10

## 2017-08-04 MED ORDER — SODIUM CHLORIDE 0.9 % IV SOLN
INTRAVENOUS | Status: DC
  Administered 2017-08-04: 11:00:00 via INTRAVENOUS

## 2017-08-04 MED ORDER — MEPERIDINE HCL 50 MG/ML IJ SOLN
INTRAMUSCULAR | Status: AC
  Filled 2017-08-04: qty 1

## 2017-08-04 MED ORDER — PANTOPRAZOLE SODIUM 40 MG PO TBEC: 40.0000 mg | DELAYED_RELEASE_TABLET | Freq: Two times a day (BID) | ORAL | 2 refills | Status: DC

## 2017-08-04 MED ORDER — SODIUM CHLORIDE 0.9% FLUSH
INTRAVENOUS | Status: AC
  Filled 2017-08-04: qty 10

## 2017-08-04 MED ORDER — MIDAZOLAM HCL 5 MG/5ML IJ SOLN
INTRAMUSCULAR | Status: DC | PRN
  Administered 2017-08-04: 3 mg via INTRAVENOUS
  Administered 2017-08-04: 1 mg via INTRAVENOUS
  Administered 2017-08-04 (×2): 2 mg via INTRAVENOUS
  Administered 2017-08-04: 3 mg via INTRAVENOUS
  Administered 2017-08-04: 2 mg via INTRAVENOUS

## 2017-08-04 MED ORDER — PROMETHAZINE HCL 25 MG/ML IJ SOLN
INTRAMUSCULAR | Status: AC
  Filled 2017-08-04: qty 1

## 2017-08-04 MED ORDER — MEPERIDINE HCL 50 MG/ML IJ SOLN
INTRAMUSCULAR | Status: DC | PRN
  Administered 2017-08-04 (×4): 25 mg via INTRAVENOUS

## 2017-08-04 MED ORDER — LIDOCAINE VISCOUS 2 % MT SOLN
OROMUCOSAL | Status: AC
  Filled 2017-08-04: qty 15

## 2017-08-04 MED ORDER — SIMETHICONE 40 MG/0.6ML PO SUSP
ORAL | Status: DC | PRN
  Administered 2017-08-04: 2.5 mL

## 2017-08-04 MED ORDER — MIDAZOLAM HCL 5 MG/5ML IJ SOLN
INTRAMUSCULAR | Status: AC
  Filled 2017-08-04: qty 10

## 2017-08-04 NOTE — Op Note (Signed)
[facility] Patient Name: Terry [name] Procedure Date: 08/04/2017 12:00 PM MRN: [id number] Date of Birth: 05-Sep-1960 Attending MD: Hildred Laser , MD CSN: [id number] Age: 57 Admit Type: Outpatient Procedure:                Upper GI endoscopy Indications:              Esophageal dysphagia Providers:                Hildred Laser, MD, Gwynneth Albright RN, RN,                            Randa Spike, Technician Referring MD:             Elayne Snare Wolfgang Phoenix, MD Medicines:                Lidocaine spray, Promethazine 12.5 mg IV,                            Meperidine 100 mg IV, Midazolam 10 mg IV Complications:            No immediate complications. Estimated Blood Loss:     Estimated blood loss: none. Estimated blood loss                            was minimal. Procedure:                Pre-Anesthesia Assessment:                           - Prior to the procedure, a History and Physical                            was performed, and patient medications and                            allergies were reviewed. The patient's tolerance of                            previous anesthesia was also reviewed. The risks                            and benefits of the procedure and the sedation                            options and risks were discussed with the patient.                            All questions were answered, and informed consent                            was obtained. Prior Anticoagulants: The patient                            last took aspirin 3 days prior to the procedure.  ASA Grade Assessment: III - A patient with severe                            systemic disease. After reviewing the risks and                            benefits, the patient was deemed in satisfactory                            condition to undergo the procedure.                           After obtaining informed consent, the endoscope was                            passed under  direct vision. Throughout the                            procedure, the patient's blood pressure, pulse, and                            oxygen saturations were monitored continuously. The                            EG-[id number] ([id number]) scope was introduced through the                            mouth, and advanced to the second part of duodenum.                            The upper GI endoscopy was somewhat difficult due                            to the patient's agitation. The patient tolerated                            the procedure well. Scope In: 12:32:15 PM Scope Out: 12:41:56 PM Total Procedure Duration: 0 hours 9 minutes 41 seconds  Findings:      The proximal esophagus and mid esophagus were normal.      LA Grade C (one or more mucosal breaks continuous between tops of 2 or       more mucosal folds, less than 75% circumference) esophagitis was found       34 to 36 cm from the incisors.      One benign-appearing, intrinsic moderate stenosis was found 36 cm from       the incisors. This stenosis measured 1.2 cm (inner diameter) x less than       one cm (in length). The stenosis was traversed. A TTS dilator was passed       through the scope. Dilation with a 15-16.5-18 mm balloon dilator was       performed to 15 mm, 16.5 mm and 18 mm. The dilation site was examined       and showed moderate improvement in luminal narrowing and no perforation.  A 3 cm hiatal hernia was present.      The entire examined stomach was normal.      The duodenal bulb and second portion of the duodenum were normal. Impression:               - Normal proximal esophagus and mid esophagus.                           - LA Grade C reflux esophagitis.                           - Benign-appearing esophageal stenosis. Dilated.                           - 3 cm hiatal hernia.                           - Normal stomach.                           - Normal duodenal bulb and second portion of the                             duodenum.                           - No specimens collected. Moderate Sedation:      Moderate (conscious) sedation was administered by the endoscopy nurse       and supervised by the endoscopist. The following parameters were       monitored: oxygen saturation, heart rate, blood pressure, CO2       capnography and response to care. Total physician intraservice time was       9 minutes. Recommendation:           - Patient has a contact number available for                            emergencies. The signs and symptoms of potential                            delayed complications were discussed with the                            patient. Return to normal activities tomorrow.                            Written discharge instructions were provided to the                            patient.                           - Resume previous diet today.                           - Continue present medications.                           -  No aspirin, ibuprofen, naproxen, or other                            non-steroidal anti-inflammatory drugs for 3 days.                           - Use Protonix (pantoprazole) 40 mg PO BID today.                           - Office visit in 3 months. Procedure Code(s):        --- Professional ---                           [phone], Esophagogastroduodenoscopy, flexible,                            transoral; with transendoscopic balloon dilation of                            esophagus (less than 30 mm diameter) Diagnosis Code(s):        --- Professional ---                           K21.0, Gastro-esophageal reflux disease with                            esophagitis                           K22.2, Esophageal obstruction                           K44.9, Diaphragmatic hernia without obstruction or                            gangrene                           R13.14, Dysphagia, pharyngoesophageal phase CPT copyright 2017 [employer]. All rights  reserved. The codes documented in this report are preliminary and upon coder review may  be revised to meet current compliance requirements. Hildred Laser, MD Hildred Laser, MD 08/04/2017 1:19:08 PM This report has been signed electronically. Number of Addenda: 0

## 2017-08-04 NOTE — Discharge Instructions (Signed)
No aspirin or NSAIDs for 3 days. Resume medications and diet as before.   Antireflux measures sheet. Pantoprazole 40 mg by mouth 30 minutes before breakfast and evening meal daily. No driving for 24 hours. Physician will call with biopsy results. Office visit in 3 months.   Colonoscopy, Adult, Care After This sheet gives you information about how to care for yourself after your procedure. Your health care provider may also give you more specific instructions. If you have problems or questions, contact your health care provider. Dr Laural Golden: [phone] What can I expect after the procedure? After the procedure, it is common to have:  A small amount of blood in your stool for 24 hours after the procedure.  Some gas.  Mild abdominal cramping or bloating.  Follow these instructions at home: General instructions   For the first 24 hours after the procedure: ? Do not drive or use machinery. ? Do not sign important documents. ? Do not drink alcohol. ? Rest often.  Take over-the-counter or prescription medicines only as told by your health care provider.  It is up to you to get the results of your procedure. Ask your health care provider, or the department performing the procedure, when your results will be ready. Relieving cramping and bloating  Try walking around when you have cramps or feel bloated. Eating and drinking  Drink enough fluid to keep your urine clear or pale yellow.  Resume your normal diet as instructed by your health care provider. Contact a health care provider if:  You have blood in your stool 2-3 days after the procedure. Get help right away if:  You have more than a small spotting of blood in your stool.  You pass large blood clots in your stool.  Your abdomen is swollen.  You have nausea or vomiting.  You have a fever.  You have increasing abdominal pain that is not relieved with medicine. This information is not intended to replace advice given  to you by your health care provider. Make sure you discuss any questions you have with your health care provider. Document Released: 10/27/2003 Document Revised: 12/07/2015 Document Reviewed: 05/26/2015 Elsevier Interactive Patient Education  2018 [employer].  Esophagogastroduodenoscopy, Care After Refer to this sheet in the next few weeks. These instructions provide you with information about caring for yourself after your procedure. Your health care provider may also give you more specific instructions. Your treatment has been planned according to current medical practices, but problems sometimes occur. Call your health care provider if you have any problems or questions after your procedure. What can I expect after the procedure? After the procedure, it is common to have:  A sore throat.  Nausea.  Bloating.  Dizziness.  Fatigue.  Follow these instructions at home:  Do not eat or drink anything until the numbing medicine (local anesthetic) has worn off and your gag reflex has returned. You will know that the local anesthetic has worn off when you can swallow comfortably.  Do not drive for 24 hours if you received a medicine to help you relax (sedative).  If your health care provider took a tissue sample for testing during the procedure, make sure to get your test results. This is your responsibility. Ask your health care provider or the department performing the test when your results will be ready.  Keep all follow-up visits as told by your health care provider. This is important. Contact a health care provider if:  You cannot stop coughing. Get help  right away if:  You have trouble swallowing.  You cannot eat or drink.  You have throat or chest pain that gets worse.  You have nausea or vomiting.  You have chills.  You have a fever.  You have severe abdominal pain.  You have black, tarry, or bloody stools. This information is not intended to replace advice given  to you by your health care provider. Make sure you discuss any questions you have with your health care provider. Document Released: 02/29/2012 Document Revised: 08/20/2015 Document Reviewed: 02/05/2015 Elsevier Interactive Patient Education  2018 [employer].   Gastroesophageal Reflux Disease, Adult Normally, food travels down the esophagus and stays in the stomach to be digested. However, when a person has gastroesophageal reflux disease (GERD), food and stomach acid move back up into the esophagus. When this happens, the esophagus becomes sore and inflamed. Over time, GERD can create small holes (ulcers) in the lining of the esophagus. What are the causes? This condition is caused by a problem with the muscle between the esophagus and the stomach (lower esophageal sphincter, or LES). Normally, the LES muscle closes after food passes through the esophagus to the stomach. When the LES is weakened or abnormal, it does not close properly, and that allows food and stomach acid to go back up into the esophagus. The LES can be weakened by certain dietary substances, medicines, and medical conditions, including:  Tobacco use.  Pregnancy.  Having a hiatal hernia.  Heavy alcohol use.  Certain foods and beverages, such as coffee, chocolate, onions, and peppermint.  What increases the risk? This condition is more likely to develop in:  People who have an increased body weight.  People who have connective tissue disorders.  People who use NSAID medicines.  What are the signs or symptoms? Symptoms of this condition include:  Heartburn.  Difficult or painful swallowing.  The feeling of having a lump in the throat.  Abitter taste in the mouth.  Bad breath.  Having a large amount of saliva.  Having an upset or bloated stomach.  Belching.  Chest pain.  Shortness of breath or wheezing.  Ongoing (chronic) cough or a night-time cough.  Wearing away of tooth enamel.  Weight  loss.  Different conditions can cause chest pain. Make sure to see your health care provider if you experience chest pain. How is this diagnosed? Your health care provider will take a medical history and perform a physical exam. To determine if you have mild or severe GERD, your health care provider may also monitor how you respond to treatment. You may also have other tests, including:  An endoscopy toexamine your stomach and esophagus with a small camera.  A test thatmeasures the acidity level in your esophagus.  A test thatmeasures how much pressure is on your esophagus.  A barium swallow or modified barium swallow to show the shape, size, and functioning of your esophagus.  How is this treated? The goal of treatment is to help relieve your symptoms and to prevent complications. Treatment for this condition may vary depending on how severe your symptoms are. Your health care provider may recommend:  Changes to your diet.  Medicine.  Surgery.  Follow these instructions at home: Diet  Follow a diet as recommended by your health care provider. This may involve avoiding foods and drinks such as: ? Coffee and tea (with or without caffeine). ? Drinks that containalcohol. ? Energy drinks and sports drinks. ? Carbonated drinks or sodas. ? Chocolate and cocoa. ?  Peppermint and mint flavorings. ? Garlic and onions. ? Horseradish. ? Spicy and acidic foods, including peppers, chili powder, curry powder, vinegar, hot sauces, and barbecue sauce. ? Citrus fruit juices and citrus fruits, such as oranges, lemons, and limes. ? Tomato-based foods, such as red sauce, chili, salsa, and pizza with red sauce. ? Fried and fatty foods, such as donuts, french fries, potato chips, and high-fat dressings. ? High-fat meats, such as hot dogs and fatty cuts of red and white meats, such as rib eye steak, sausage, ham, and bacon. ? High-fat dairy items, such as whole milk, butter, and cream  cheese.  Eat small, frequent meals instead of large meals.  Avoid drinking large amounts of liquid with your meals.  Avoid eating meals during the 2-3 hours before bedtime.  Avoid lying down right after you eat.  Do not exercise right after you eat. General instructions  Pay attention to any changes in your symptoms.  Take over-the-counter and prescription medicines only as told by your health care provider. Do not take aspirin, ibuprofen, or other NSAIDs unless your health care provider told you to do so.  Do not use any tobacco products, including cigarettes, chewing tobacco, and e-cigarettes. If you need help quitting, ask your health care provider.  Wear loose-fitting clothing. Do not wear anything tight around your waist that causes pressure on your abdomen.  Raise (elevate) the head of your bed 6 inches (15cm).  Try to reduce your stress, such as with yoga or meditation. If you need help reducing stress, ask your health care provider.  If you are overweight, reduce your weight to an amount that is healthy for you. Ask your health care provider for guidance about a safe weight loss goal.  Keep all follow-up visits as told by your health care provider. This is important. Contact a health care provider if:  You have new symptoms.  You have unexplained weight loss.  You have difficulty swallowing, or it hurts to swallow.  You have wheezing or a persistent cough.  Your symptoms do not improve with treatment.  You have a hoarse voice. Get help right away if:  You have pain in your arms, neck, jaw, teeth, or back.  You feel sweaty, dizzy, or light-headed.  You have chest pain or shortness of breath.  You vomit and your vomit looks like blood or coffee grounds.  You faint.  Your stool is bloody or black.  You cannot swallow, drink, or eat. This information is not intended to replace advice given to you by your health care provider. Make sure you discuss any  questions you have with your health care provider. Document Released: 12/22/2004 Document Revised: 08/12/2015 Document Reviewed: 07/09/2014 Elsevier Interactive Patient Education  [employer].

## 2017-08-04 NOTE — Op Note (Signed)
[facility] Patient Name: Terry [name] Procedure Date: 08/04/2017 12:44 PM MRN: [id number] Date of Birth: October 15, 1960 Attending MD: Hildred Laser , MD CSN: [id number] Age: 56 Admit Type: Outpatient Procedure:                Colonoscopy Indications:              Screening for colorectal malignant neoplasm Providers:                Hildred Laser, MD, Charlsie Quest. Joanne Gavel, RN,                            Randa Spike, Technician Referring MD:             Elayne Snare. Luking, MD Medicines:                Midazolam 3 mg IV Complications:            No immediate complications. Estimated Blood Loss:     Estimated blood loss was minimal. Procedure:                Pre-Anesthesia Assessment:                           - Prior to the procedure, a History and Physical                            was performed, and patient medications and                            allergies were reviewed. The patient's tolerance of                            previous anesthesia was also reviewed. The risks                            and benefits of the procedure and the sedation                            options and risks were discussed with the patient.                            All questions were answered, and informed consent                            was obtained. Prior Anticoagulants: The patient                            last took aspirin 3 days prior to the procedure.                            ASA Grade Assessment: III - A patient with severe                            systemic disease. After reviewing the risks and  benefits, the patient was deemed in satisfactory                            condition to undergo the procedure.                           After obtaining informed consent, the colonoscope                            was passed under direct vision. Throughout the                            procedure, the patient's blood pressure, pulse, and                             oxygen saturations were monitored continuously. The                            [id number] ([id number]) scope was introduced through                            the anus and advanced to the the cecum, identified                            by appendiceal orifice and ileocecal valve. The                            colonoscopy was somewhat difficult. The patient                            tolerated the procedure well. The quality of the                            bowel preparation was adequate to identify polyps.                            The ileocecal valve, appendiceal orifice, and                            rectum were photographed. Scope In: 42:59:56 PM Scope Out: 1:05:47 PM Scope Withdrawal Time: 0 hours 10 minutes 22 seconds  Total Procedure Duration: 0 hours 19 minutes 45 seconds  Findings:      The perianal and digital rectal examinations were normal.      A small polyp was found in the descending colon. The polyp was sessile.       Biopsies were taken with a cold forceps for histology.      The exam was otherwise normal throughout the examined colon.      The retroflexed view of the distal rectum and anal verge was normal and       showed no anal or rectal abnormalities. Impression:               - One small polyp in the descending colon. Biopsied. Moderate Sedation:      Moderate (conscious) sedation was administered by the endoscopy nurse  and supervised by the endoscopist. The following parameters were       monitored: oxygen saturation, heart rate, blood pressure, CO2       capnography and response to care. Total physician intraservice time was       19 minutes. Recommendation:           - Patient has a contact number available for                            emergencies. The signs and symptoms of potential                            delayed complications were discussed with the                            patient. Return to normal activities tomorrow.                             Written discharge instructions were provided to the                            patient.                           - Resume previous diet today.                           - Continue present medications.                           - Await pathology results.                           - Repeat colonoscopy for surveillance based on                            pathology results. Procedure Code(s):        --- Professional ---                           [phone], Colonoscopy, flexible; with biopsy, single                            or multiple                           G0500, Moderate sedation services provided by the                            same physician or other qualified health care                            professional performing a gastrointestinal                            endoscopic service that sedation supports,  requiring the presence of an independent trained                            observer to assist in the monitoring of the                            patient's level of consciousness and physiological                            status; initial 15 minutes of intra-service time;                            patient age 73 years or older (additional time may                            be reported with [phone], as appropriate) Diagnosis Code(s):        --- Professional ---                           Z12.11, Encounter for screening for malignant                            neoplasm of colon                           D12.4, Benign neoplasm of descending colon CPT copyright 2017 [employer]. All rights reserved. The codes documented in this report are preliminary and upon coder review may  be revised to meet current compliance requirements. Hildred Laser, MD Hildred Laser, MD 08/04/2017 1:23:08 PM This report has been signed electronically. Number of Addenda: 0

## 2017-08-04 NOTE — H&P (Signed)
Terry [name] is an 57 y.o. male.   Chief Complaint: Patient is here for EGD, EGD and colonoscopy. HPI: Patient is 57 year old Caucasian male who presents with 82-month history of intermittent solid food dysphagia.  He is points to the suprasternal area as site of bolus obstruction.  He had 2 episodes where he had to wait for a 5 minutes before the food bolus went down.  He has no difficulty with clear liquids.  He denies chronic heartburn.  He denies melena or rectal bleeding.  He is also undergoing screening colonoscopy. Family history is negative for CRC.  Past Medical History:  Diagnosis Date  . Diabetes mellitus without complication ([place]) 4801  . Hyperlipidemia   . Hypertension   . Osteoarthritis     Past Surgical History:  Procedure Laterality Date  . ANKLE SURGERY Left 2007   2 plates/screws    Family History  Problem Relation Age of Onset  . Hypertension Mother   . Diabetes Mother   . Heart disease Mother   . Hyperlipidemia Mother   . Cancer Father        lung   Social History:  reports that he quit smoking about 25 years ago. He has never used smokeless tobacco. He reports that he has current or past drug history. Drug: Marijuana. He reports that he does not drink alcohol.  Allergies:  Allergies  Allergen Reactions  . Altace [Ramipril] Cough  . Invokana [Canagliflozin] Other (See Comments)    laryngitis  . Metformin And Related Diarrhea    Medications Prior to Admission  Medication Sig Dispense Refill  . amLODipine (NORVASC) 10 MG tablet TAKE (1) TABLET BY MOUTH ONCE DAILY. 90 tablet 1  . aspirin EC 81 MG tablet Take 81 mg by mouth at bedtime.     Marland Kitchen atorvastatin (LIPITOR) 80 MG tablet TAKE (1) TABLET BY MOUTH ONCE DAILY. 90 tablet 1  . CONTOUR NEXT TEST test strip TEST BLOOD SUGAR 4 TIMES DAILY. 100 each 5  . indapamide (LOZOL) 2.5 MG tablet Take 1 tablet (2.5 mg total) by mouth daily. 90 tablet 0  . Insulin Glargine (LANTUS SOLOSTAR) 100 UNIT/ML Solostar Pen  Inject 80-120 Units into the skin See admin instructions. 120 units in the morning, 80 units at bedtime    . Insulin Lispro (HUMALOG KWIKPEN) 200 UNIT/ML SOPN Inject 36 Units into the skin 3 (three) times daily before meals.    Marland Kitchen losartan (COZAAR) 100 MG tablet TAKE (1) TABLET BY MOUTH ONCE DAILY. 90 tablet 1  . metoprolol succinate (TOPROL-XL) 25 MG 24 hr tablet TAKE (1) TABLET BY MOUTH ONCE DAILY. 90 tablet 1  . mometasone (ELOCON) 0.1 % cream Apply 1 application topically 2 (two) times daily as needed. 45 g 3  . Multiple Vitamin (MULTIVITAMIN) tablet Take 1 tablet by mouth daily.    Marland Kitchen oxyCODONE-acetaminophen (PERCOCET) 10-325 MG tablet Take 1 tablet by mouth four times a day as needed for pain.( Must last 30 days) 120 tablet 0  . sertraline (ZOLOFT) 50 MG tablet Take 1 tablet (50 mg total) by mouth daily. 30 tablet 5  . VICTOZA 18 MG/3ML SOPN INJECT 1.8MG  SUBCUTANEOUSLY ONCE DAILY. 9 mL 5  . sildenafil (REVATIO) 20 MG tablet Take 1-5 tablets before sexual activity as directed. No greater that 5 tablets in a day. 25 tablet 6    Results for orders placed or performed during the hospital encounter of 08/04/17 (from the past 48 hour(s))  Glucose, capillary     Status: Abnormal  Collection Time: 08/04/17 11:00 AM  Result Value Ref Range   Glucose-Capillary 223 (H) 65 - 99 mg/dL   No results found.  ROS  Blood pressure (!) 150/67, pulse 95, temperature 98.2 F (36.8 C), temperature source Oral, resp. rate 17, SpO2 100 %. Physical Exam  Constitutional: He appears well-developed and well-nourished.  HENT:  Mouth/Throat: Oropharynx is clear and moist.  Eyes: Conjunctivae are normal. No scleral icterus.  Neck: No thyromegaly present.  Cardiovascular: Normal rate, regular rhythm and normal heart sounds.  No murmur heard. Respiratory: Effort normal and breath sounds normal.  GI: Soft. He exhibits no distension and no mass. There is no tenderness.  Musculoskeletal: He exhibits no edema.   Lymphadenopathy:    He has no cervical adenopathy.  Neurological: He is alert.  Skin: Skin is warm and dry.     Assessment/Plan Solid food dysphagia. EGD with ED and average risk screening colonoscopy.  Hildred Laser, MD 08/04/2017, 12:13 PM

## 2017-08-09 ENCOUNTER — Encounter (HOSPITAL_COMMUNITY): Payer: Self-pay | Admitting: Internal Medicine

## 2017-09-25 ENCOUNTER — Other Ambulatory Visit: Payer: Self-pay | Admitting: Family Medicine

## 2017-10-05 ENCOUNTER — Other Ambulatory Visit (INDEPENDENT_AMBULATORY_CARE_PROVIDER_SITE_OTHER): Payer: Self-pay | Admitting: Internal Medicine

## 2017-10-30 ENCOUNTER — Other Ambulatory Visit: Payer: Self-pay | Admitting: Family Medicine

## 2017-10-30 ENCOUNTER — Encounter: Payer: Self-pay | Admitting: Family Medicine

## 2017-10-30 ENCOUNTER — Ambulatory Visit: Payer: BLUE CROSS/BLUE SHIELD | Admitting: Family Medicine

## 2017-10-30 VITALS — BP 142/82 | Ht 70.0 in | Wt 205.0 lb

## 2017-10-30 DIAGNOSIS — M1990 Unspecified osteoarthritis, unspecified site: Secondary | ICD-10-CM

## 2017-10-30 DIAGNOSIS — E119 Type 2 diabetes mellitus without complications: Secondary | ICD-10-CM | POA: Diagnosis not present

## 2017-10-30 DIAGNOSIS — G8929 Other chronic pain: Secondary | ICD-10-CM

## 2017-10-30 DIAGNOSIS — I10 Essential (primary) hypertension: Secondary | ICD-10-CM | POA: Diagnosis not present

## 2017-10-30 DIAGNOSIS — M25572 Pain in left ankle and joints of left foot: Secondary | ICD-10-CM

## 2017-10-30 DIAGNOSIS — E782 Mixed hyperlipidemia: Secondary | ICD-10-CM

## 2017-10-30 LAB — POCT GLYCOSYLATED HEMOGLOBIN (HGB A1C): HEMOGLOBIN A1C: 6 % — AB (ref 4.0–5.6)

## 2017-10-30 MED ORDER — SERTRALINE HCL 50 MG PO TABS: 50.0000 mg | ORAL_TABLET | Freq: Every day | ORAL | 5 refills | Status: DC

## 2017-10-30 MED ORDER — DICLOFENAC SODIUM 75 MG PO TBEC: DELAYED_RELEASE_TABLET | ORAL | 0 refills | Status: DC

## 2017-10-30 MED ORDER — OXYCODONE-ACETAMINOPHEN 10-325 MG PO TABS: ORAL_TABLET | ORAL | 0 refills | Status: DC

## 2017-10-30 NOTE — Telephone Encounter (Signed)
The zoloft I just filled today Insulin refills can be 6 months worth of each

## 2017-10-30 NOTE — Progress Notes (Signed)
Subjective:    Patient ID: Terry [name], male    DOB: 1961/01/17, 57 y.o.   MRN: [id number]  Diabetes  He presents for his follow-up diabetic visit. He has type 2 diabetes mellitus. There are no hypoglycemic associated symptoms. Pertinent negatives for hypoglycemia include no confusion, dizziness or headaches. There are no diabetic associated symptoms. Pertinent negatives for diabetes include no chest pain and no fatigue. There are no hypoglycemic complications. There are no diabetic complications. He is compliant with treatment all of the time. He sees a podiatrist.Eye exam is current.   Patient for blood pressure check up.  The patient does have hypertension.  The patient is on medication.  Patient relates compliance with meds. Todays BP reviewed with the patient. Patient denies issues with medication. Patient relates reasonable diet. Patient tries to minimize salt. Patient aware of BP goals.  Patient here for follow-up regarding cholesterol.  The patient does have hyperlipidemia.  Patient does try to maintain a reasonable diet.  Patient does take the medication on a regular basis.  Denies missing a dose.  The patient denies any obvious side effects.  Prior blood work results reviewed with the patient.  The patient is aware of his cholesterol goals and the need to keep it under good control to lessen the risk of disease.   This patient was seen today for chronic pain  The medication list was reviewed and updated.   -Compliance with medication: yes  - Number patient states they take daily: 4  -when was the last dose patient took? yesterday  The patient was advised the importance of maintaining medication and not using illegal substances with these.  Here for refills and follow up  The patient was educated that we can provide 3 monthly scripts for their medication, it is their responsibility to follow the instructions.  Side effects or complications from medications: none  Patient is  aware that pain medications are meant to minimize the severity of the pain to allow their pain levels to improve to allow for better function. They are aware of that pain medications cannot totally remove their pain.  Due for UDT ( at least once per year) : last one done 05/01/17 Results for orders placed or performed in visit on 10/30/17  POCT HgB A1C  Result Value Ref Range   Hemoglobin A1C 6.0 (A) 4.0 - 5.6 %   HbA1c POC (<> result, manual entry)  4.0 - 5.6 %   HbA1c, POC (prediabetic range)  5.7 - 6.4 %   HbA1c, POC (controlled diabetic range)  0.0 - 7.0 %   Hand pain more in the right Tender ti the touch Present for several months Patient relates significant discomfort in his hand mainly in the MCP joint on the right hand denies any trouble with it but does work with his hands a lot.  In the past we have done a rheumatoid test and a sedimentation rate were normal   Blood pressure initially elevated he was asked to rest here in the office for 15 minutes we rechecked blood pressure was acceptable  Review of Systems  Constitutional: Negative for diaphoresis and fatigue.  HENT: Negative for congestion and rhinorrhea.   Respiratory: Negative for cough and shortness of breath.   Cardiovascular: Negative for chest pain and leg swelling.  Gastrointestinal: Negative for abdominal pain and diarrhea.  Skin: Negative for color change and rash.  Neurological: Negative for dizziness and headaches.  Psychiatric/Behavioral: Negative for behavioral problems and confusion.  Objective:   Physical Exam  Constitutional: He appears well-nourished. No distress.  HENT:  Head: Normocephalic and atraumatic.  Eyes: Right eye exhibits no discharge. Left eye exhibits no discharge.  Neck: No tracheal deviation present.  Cardiovascular: Normal rate, regular rhythm and normal heart sounds.  No murmur heard. Pulmonary/Chest: Effort normal and breath sounds normal. No respiratory distress.    Musculoskeletal: He exhibits no edema.  Lymphadenopathy:    He has no cervical adenopathy.  Neurological: He is alert. Coordination normal.  Skin: Skin is warm and dry.  Psychiatric: He has a normal mood and affect. His behavior is normal.  Vitals reviewed.   Diabetic foot exam acceptable  25 minutes was spent with the patient.  This statement verifies that 25 minutes was indeed spent with the patient.  More than 50% of this visit-total duration of the visit-was spent in counseling and coordination of care. The issues that the patient came in for today as reflected in the diagnosis (s) please refer to documentation for further details.     Assessment & Plan:  The patient was seen today as part of a comprehensive visit for diabetes. The importance of keeping her A1c at or below 7 was discussed.  Importance of regular physical activity was discussed.   The importance of adherence to medication as well as a controlled low starch/sugar diet was also discussed.  Standard follow-up visit recommended.  Also patient aware failure to keep diabetes under control increases the risk of complications.  The patient was seen today as part of an evaluation regarding hyperlipidemia.  Recent lab work has been reviewed with the patient as well as the goals for good cholesterol care.  In addition to this medications have been discussed the importance of compliance with diet and medications discussed as well.  Finally the patient is aware that poor control of cholesterol, noncompliance can dramatically increase the risk of complications. The patient will keep regular office visits and the patient does agreed to periodic lab work.  HTN- Patient was seen today as part of a visit regarding hypertension. The importance of healthy diet and regular physical activity was discussed. The importance of compliance with medications discussed.  Ideal goal is to keep blood pressure low elevated levels certainly below  559/74 when possible.  The patient was counseled that keeping blood pressure under control lessen his risk of complications.  The importance of regular follow-ups was discussed with the patient.  Low-salt diet such as DASH recommended.  Regular physical activity was recommended as well.  Patient was advised to keep regular follow-ups.  The patient was seen in followup for chronic pain. A review over at their current pain status was discussed. Drug registry was checked. Prescriptions were given. Discussion was held regarding the importance of compliance with medication as well as pain medication contract.  Time for questions regarding pain management plan occurred. Importance of regular followup visits was discussed. Patient was informed that medication may cause drowsiness and should not be combined  with other medications/alcohol or street drugs. Patient was cautioned that medication could cause drowsiness. If the patient feels medication is causing altered alertness then do not drive or operate dangerous equipment.  Hand arthralgia if he has progressive troubles he will let us know we will help set him up with orthopedic specialist for injections I do not feel he has rheumatoid  Lab work before next visit

## 2017-11-01 ENCOUNTER — Telehealth: Payer: Self-pay | Admitting: *Deleted

## 2017-11-01 NOTE — Telephone Encounter (Signed)
Received PA for Humalog Kwiki Pen. Prior auth sent via cover my meds-await response.

## 2017-11-01 NOTE — Telephone Encounter (Signed)
Humalog Kwiki Pen 200u/ml approved per [employer] for 3 years-10/2017-10/2020

## 2017-11-06 ENCOUNTER — Other Ambulatory Visit: Payer: Self-pay | Admitting: Family Medicine

## 2017-11-07 ENCOUNTER — Encounter (INDEPENDENT_AMBULATORY_CARE_PROVIDER_SITE_OTHER): Payer: Self-pay | Admitting: Internal Medicine

## 2017-11-07 ENCOUNTER — Ambulatory Visit (INDEPENDENT_AMBULATORY_CARE_PROVIDER_SITE_OTHER): Payer: BLUE CROSS/BLUE SHIELD | Admitting: Internal Medicine

## 2017-11-07 VITALS — BP 160/82 | HR 72 | Temp 98.0°F | Ht 70.0 in | Wt 205.0 lb

## 2017-11-07 DIAGNOSIS — K219 Gastro-esophageal reflux disease without esophagitis: Secondary | ICD-10-CM | POA: Diagnosis not present

## 2017-11-07 DIAGNOSIS — R1319 Other dysphagia: Secondary | ICD-10-CM

## 2017-11-07 DIAGNOSIS — R1310 Dysphagia, unspecified: Secondary | ICD-10-CM

## 2017-11-07 MED ORDER — PANTOPRAZOLE SODIUM 40 MG PO TBEC: DELAYED_RELEASE_TABLET | ORAL | 3 refills | Status: DC

## 2017-11-07 NOTE — Progress Notes (Signed)
Subjective:    Patient ID: Terry [name], male    DOB: Mar 18, 1961, 57 y.o.   MRN: [id number]  HPI Here today for f/u. Underwent an EGD/ED in May of this year for dysphagia. Normal proximal esophagus and mid esophagus. LA Grade C reflux esophagitis. Benign appearing esophageal stenosis dilated. 3 cm hiatal hernia. Normal duodenal bulb and second portion of the duodenum.  May 2019 Colonoscopy: normal. One polyp. Biopsy: Hyperplastic polyp. He tells me swallowing is better.  Can eat anything he wants. Has not eaten a steak yet. Appetite is good. No weight.   Review of Systems Past Medical History:  Diagnosis Date  . Diabetes mellitus without complication ([place]) 5631  . Hyperlipidemia   . Hypertension   . Osteoarthritis     Past Surgical History:  Procedure Laterality Date  . ANKLE SURGERY Left 2007   2 plates/screws  . COLONOSCOPY N/A 08/04/2017   Procedure: COLONOSCOPY;  Surgeon: Rogene Houston, MD;  Location: [facility];  Service: Endoscopy;  Laterality: N/A;  . ESOPHAGEAL DILATION N/A 08/04/2017   Procedure: ESOPHAGEAL DILATION;  Surgeon: Rogene Houston, MD;  Location: [facility];  Service: Endoscopy;  Laterality: N/A;  . ESOPHAGOGASTRODUODENOSCOPY N/A 08/04/2017   Procedure: ESOPHAGOGASTRODUODENOSCOPY (EGD);  Surgeon: Rogene Houston, MD;  Location: [facility];  Service: Endoscopy;  Laterality: N/A;  . POLYPECTOMY  08/04/2017   Procedure: POLYPECTOMY;  Surgeon: Rogene Houston, MD;  Location: [facility];  Service: Endoscopy;;    Allergies  Allergen Reactions  . Altace [Ramipril] Cough  . Invokana [Canagliflozin] Other (See Comments)    laryngitis  . Metformin And Related Diarrhea  . Novolog [Insulin Aspart]     intolerance    Current Outpatient Medications on File Prior to Visit  Medication Sig Dispense Refill  . amLODipine (NORVASC) 10 MG tablet TAKE (1) TABLET BY MOUTH ONCE DAILY. 90 tablet 1  . aspirin EC 81 MG tablet Take 1 tablet (81 mg total) by  mouth at bedtime.    Marland Kitchen atorvastatin (LIPITOR) 80 MG tablet TAKE (1) TABLET BY MOUTH ONCE DAILY. 90 tablet 1  . CONTOUR NEXT TEST test strip TEST BLOOD SUGAR 4 TIMES DAILY. 100 each 5  . diclofenac (VOLTAREN) 75 MG EC tablet Take one tablet two times daily 30 tablet 0  . HUMALOG KWIKPEN 200 UNIT/ML SOPN INJECT 140-150 UNITS INTO SKIN DAILY. 18 mL 5  . indapamide (LOZOL) 2.5 MG tablet TAKE (1) TABLET BY MOUTH ONCE DAILY. 90 tablet 1  . LANTUS SOLOSTAR 100 UNIT/ML Solostar Pen INJECT 90 UNITS TWICE DAILY. TITRATE TO 200 UNITS A DAY. 60 mL 5  . losartan (COZAAR) 100 MG tablet TAKE (1) TABLET BY MOUTH ONCE DAILY. 90 tablet 1  . metoprolol succinate (TOPROL-XL) 25 MG 24 hr tablet TAKE (1) TABLET BY MOUTH ONCE DAILY. 90 tablet 0  . mometasone (ELOCON) 0.1 % cream Apply 1 application topically 2 (two) times daily as needed. 45 g 3  . Multiple Vitamin (MULTIVITAMIN) tablet Take 1 tablet by mouth daily.    Marland Kitchen oxyCODONE-acetaminophen (PERCOCET) 10-325 MG tablet Take 1 tablet by mouth four times a day as needed for pain.( Must last 30 days) 120 tablet 0  . sertraline (ZOLOFT) 50 MG tablet TAKE (1) TABLET BY MOUTH ONCE DAILY. 30 tablet 5  . sildenafil (REVATIO) 20 MG tablet Take 1-5 tablets before sexual activity as directed. No greater that 5 tablets in a day. 25 tablet 6  . VICTOZA 18 MG/3ML SOPN INJECT  1.8MG  SUBCUTANEOUSLY ONCE DAILY. 9 mL 1   No current facility-administered medications on file prior to visit.         Objective:   Physical Exam Blood pressure (!) 160/82, pulse 72, temperature 98 F (36.7 C), height 5\' 10"  (1.778 m), weight 205 lb (93 kg). Alert and oriented. Skin warm and dry. Oral mucosa is moist.   . Sclera anicteric, conjunctivae is pink. Thyroid not enlarged. No cervical lymphadenopathy. Lungs clear. Heart regular rate and rhythm. Loud murmur heard.   Abdomen is soft. Bowel sounds are positive. No hepatomegaly. No abdominal masses felt. No tenderness.  No edema to lower  extremities.           Assessment & Plan:  Dysphagia. Is doing well. Rx for Protonix  BID sent to his pharmacy. Will keep him on BID Protonix x 4 months and may cut him back after that. OV in 1 year.

## 2017-11-07 NOTE — Patient Instructions (Addendum)
OV 1 year. 

## 2017-11-29 ENCOUNTER — Other Ambulatory Visit: Payer: Self-pay | Admitting: Family Medicine

## 2017-12-20 ENCOUNTER — Other Ambulatory Visit: Payer: Self-pay | Admitting: Family Medicine

## 2017-12-29 ENCOUNTER — Other Ambulatory Visit: Payer: Self-pay | Admitting: Family Medicine

## 2018-01-20 DIAGNOSIS — E782 Mixed hyperlipidemia: Secondary | ICD-10-CM | POA: Diagnosis not present

## 2018-01-20 DIAGNOSIS — I10 Essential (primary) hypertension: Secondary | ICD-10-CM | POA: Diagnosis not present

## 2018-01-20 DIAGNOSIS — E119 Type 2 diabetes mellitus without complications: Secondary | ICD-10-CM | POA: Diagnosis not present

## 2018-01-22 LAB — LIPID PANEL
CHOL/HDL RATIO: 4.9 ratio (ref 0.0–5.0)
Cholesterol, Total: 163 mg/dL (ref 100–199)
HDL: 33 mg/dL — AB (ref >39)
LDL Calculated: 87 mg/dL (ref 0–99)
TRIGLYCERIDES: 215 mg/dL — AB (ref 0–149)
VLDL CHOLESTEROL CAL: 43 mg/dL — AB (ref 5–40)

## 2018-01-22 LAB — MICROALBUMIN / CREATININE URINE RATIO
CREATININE, UR: 194.4 mg/dL
MICROALB/CREAT RATIO: 74.2 mg/g{creat} — AB (ref 0.0–30.0)
MICROALBUM., U, RANDOM: 144.3 ug/mL

## 2018-01-22 LAB — BASIC METABOLIC PANEL
BUN / CREAT RATIO: 18 (ref 9–20)
BUN: 23 mg/dL (ref 6–24)
CHLORIDE: 98 mmol/L (ref 96–106)
CO2: 26 mmol/L (ref 20–29)
CREATININE: 1.26 mg/dL (ref 0.76–1.27)
Calcium: 9.8 mg/dL (ref 8.7–10.2)
GFR calc Af Amer: 73 mL/min/{1.73_m2} (ref >59)
GFR calc non Af Amer: 63 mL/min/{1.73_m2} (ref >59)
GLUCOSE: 190 mg/dL — AB (ref 65–99)
Potassium: 4.5 mmol/L (ref 3.5–5.2)
SODIUM: 141 mmol/L (ref 134–144)

## 2018-01-22 LAB — HEMOGLOBIN A1C
Est. average glucose Bld gHb Est-mCnc: 163 mg/dL
Hgb A1c MFr Bld: 7.3 % — ABNORMAL HIGH (ref 4.8–5.6)

## 2018-01-26 ENCOUNTER — Encounter: Payer: Self-pay | Admitting: Family Medicine

## 2018-01-26 ENCOUNTER — Ambulatory Visit (INDEPENDENT_AMBULATORY_CARE_PROVIDER_SITE_OTHER): Payer: BLUE CROSS/BLUE SHIELD | Admitting: Family Medicine

## 2018-01-26 VITALS — BP 156/86 | Ht 70.0 in | Wt 207.0 lb

## 2018-01-26 DIAGNOSIS — M25572 Pain in left ankle and joints of left foot: Secondary | ICD-10-CM

## 2018-01-26 DIAGNOSIS — R809 Proteinuria, unspecified: Secondary | ICD-10-CM

## 2018-01-26 DIAGNOSIS — I10 Essential (primary) hypertension: Secondary | ICD-10-CM | POA: Diagnosis not present

## 2018-01-26 DIAGNOSIS — E1129 Type 2 diabetes mellitus with other diabetic kidney complication: Secondary | ICD-10-CM

## 2018-01-26 DIAGNOSIS — Z23 Encounter for immunization: Secondary | ICD-10-CM | POA: Diagnosis not present

## 2018-01-26 DIAGNOSIS — E782 Mixed hyperlipidemia: Secondary | ICD-10-CM

## 2018-01-26 DIAGNOSIS — G8929 Other chronic pain: Secondary | ICD-10-CM

## 2018-01-26 MED ORDER — OXYCODONE-ACETAMINOPHEN 10-325 MG PO TABS: ORAL_TABLET | ORAL | 0 refills | Status: DC

## 2018-01-26 MED ORDER — LOSARTAN POTASSIUM 100 MG PO TABS: ORAL_TABLET | ORAL | 1 refills | Status: DC

## 2018-01-26 MED ORDER — INDAPAMIDE 2.5 MG PO TABS: ORAL_TABLET | ORAL | 1 refills | Status: DC

## 2018-01-26 MED ORDER — INSULIN ASPART 100 UNIT/ML ~~LOC~~ SOLN: SUBCUTANEOUS | 5 refills | Status: DC

## 2018-01-26 MED ORDER — INSULIN GLARGINE 100 UNIT/ML SOLOSTAR PEN: PEN_INJECTOR | SUBCUTANEOUS | 5 refills | Status: DC

## 2018-01-26 MED ORDER — ROSUVASTATIN CALCIUM 40 MG PO TABS: 40.0000 mg | ORAL_TABLET | Freq: Every day | ORAL | 1 refills | Status: DC

## 2018-01-26 MED ORDER — LIRAGLUTIDE 18 MG/3ML ~~LOC~~ SOPN: PEN_INJECTOR | SUBCUTANEOUS | 12 refills | Status: DC

## 2018-01-26 MED ORDER — AMLODIPINE BESYLATE 10 MG PO TABS: ORAL_TABLET | ORAL | 1 refills | Status: DC

## 2018-01-26 MED ORDER — METOPROLOL SUCCINATE ER 50 MG PO TB24: 50.0000 mg | ORAL_TABLET | Freq: Every day | ORAL | 1 refills | Status: DC

## 2018-01-26 NOTE — Progress Notes (Signed)
Subjective:    Patient ID: Terry [name], male    DOB: 07/13/1960, 57 y.o.   MRN: [id number]  Diabetes  He presents for his follow-up diabetic visit. He has type 2 diabetes mellitus. There are no hypoglycemic associated symptoms. Pertinent negatives for hypoglycemia include no confusion, dizziness or headaches. There are no diabetic associated symptoms. Pertinent negatives for diabetes include no chest pain and no fatigue. There are no hypoglycemic complications. There are no diabetic complications. He does not see a podiatrist.Eye exam is current.    This patient was seen today for chronic pain  The medication list was reviewed and updated.   -Compliance with medication: yes  - Number patient states they take daily: 4  -when was the last dose patient took? Last night  The patient was advised the importance of maintaining medication and not using illegal substances with these.  Here for refills and follow up  The patient was educated that we can provide 3 monthly scripts for their medication, it is their responsibility to follow the instructions.  Side effects or complications from medications: none  Patient is aware that pain medications are meant to minimize the severity of the pain to allow their pain levels to improve to allow for better function. They are aware of that pain medications cannot totally remove their pain.  Due for UDT ( at least once per year) : 05/06/2017  Patient for blood pressure check up.  The patient does have hypertension.  The patient is on medication.  Patient relates compliance with meds. Todays BP reviewed with the patient. Patient denies issues with medication. Patient relates reasonable diet. Patient tries to minimize salt. Patient aware of BP goals.  Patient here for follow-up regarding cholesterol.  The patient does have hyperlipidemia.  Patient does try to maintain a reasonable diet.  Patient does take the medication on a regular basis.  Denies  missing a dose.  The patient denies any obvious side effects.  Prior blood work results reviewed with the patient.  The patient is aware of his cholesterol goals and the need to keep it under good control to lessen the risk of disease.     Review of Systems  Constitutional: Negative for diaphoresis and fatigue.  HENT: Negative for congestion and rhinorrhea.   Respiratory: Negative for cough and shortness of breath.   Cardiovascular: Negative for chest pain and leg swelling.  Gastrointestinal: Negative for abdominal pain and diarrhea.  Skin: Negative for color change and rash.  Neurological: Negative for dizziness and headaches.  Psychiatric/Behavioral: Negative for behavioral problems and confusion.       Objective:   Physical Exam  Constitutional: He appears well-nourished. No distress.  HENT:  Head: Normocephalic and atraumatic.  Eyes: Right eye exhibits no discharge. Left eye exhibits no discharge.  Neck: No tracheal deviation present.  Cardiovascular: Normal rate, regular rhythm and normal heart sounds.  No murmur heard. Pulmonary/Chest: Effort normal and breath sounds normal. No respiratory distress.  Musculoskeletal: He exhibits no edema.  Lymphadenopathy:    He has no cervical adenopathy.  Neurological: He is alert. Coordination normal.  Skin: Skin is warm and dry.  Psychiatric: He has a normal mood and affect. His behavior is normal.  Vitals reviewed.         Assessment & Plan:  Blood pressure subpar control patient was encouraged to get the blood pressure in the 130s over 80s or less watch salt increase physical walking continue current medications increase metoprolol to 50 mg daily  Diabetes fair control reinitiate NovoLog for mealtime 6 units to begin with send [place] some readings important to get A1c down to a 7 or less read look at this again in several months  The patient was seen in followup for chronic pain. A review over at their current pain status was  discussed. Drug registry was checked. Prescriptions were given. Discussion was held regarding the importance of compliance with medication as well as pain medication contract.  Time for questions regarding pain management plan occurred. Importance of regular followup visits was discussed. Patient was informed that medication may cause drowsiness and should not be combined  with other medications/alcohol or street drugs. Patient was cautioned that medication could cause drowsiness. If the patient feels medication is causing altered alertness then do not drive or operate dangerous equipment.  Drug registry was checked pain medication will be sent into the pharmacy  Hyperlipidemia LDL not quite at goal, goal was to get LDL below 70 adjust medicines switch over to Crestor 40 mg daily  25 minutes was spent with the patient.  This statement verifies that 25 minutes was indeed spent with the patient.  More than 50% of this visit-total duration of the visit-was spent in counseling and coordination of care. The issues that the patient came in for today as reflected in the diagnosis (s) please refer to documentation for further details.  3 prescription sent in electronically

## 2018-02-02 ENCOUNTER — Ambulatory Visit: Payer: BLUE CROSS/BLUE SHIELD | Admitting: Family Medicine

## 2018-03-09 DIAGNOSIS — L720 Epidermal cyst: Secondary | ICD-10-CM | POA: Diagnosis not present

## 2018-03-27 ENCOUNTER — Telehealth: Payer: Self-pay | Admitting: *Deleted

## 2018-03-27 NOTE — Telephone Encounter (Signed)
Patient with diarrhea

## 2018-04-03 ENCOUNTER — Encounter: Payer: Self-pay | Admitting: Family Medicine

## 2018-04-03 DIAGNOSIS — L720 Epidermal cyst: Secondary | ICD-10-CM | POA: Diagnosis not present

## 2018-04-05 ENCOUNTER — Encounter: Payer: Self-pay | Admitting: Family Medicine

## 2018-04-05 NOTE — Telephone Encounter (Signed)
His wife stated he was having diarrhea issues but not bloody I sent him a letter if he is having ongoing troubles we can do some stool studies I reviewed over his medication list I do not see evidence on his list that should account for the diarrhea

## 2018-04-24 ENCOUNTER — Other Ambulatory Visit: Payer: Self-pay | Admitting: Family Medicine

## 2018-04-26 ENCOUNTER — Other Ambulatory Visit: Payer: Self-pay | Admitting: Family Medicine

## 2018-04-27 MED ORDER — OXYCODONE-ACETAMINOPHEN 10-325 MG PO TABS: ORAL_TABLET | ORAL | 0 refills | Status: DC

## 2018-04-30 ENCOUNTER — Ambulatory Visit (INDEPENDENT_AMBULATORY_CARE_PROVIDER_SITE_OTHER): Payer: BLUE CROSS/BLUE SHIELD | Admitting: Family Medicine

## 2018-04-30 ENCOUNTER — Encounter: Payer: Self-pay | Admitting: Family Medicine

## 2018-04-30 ENCOUNTER — Other Ambulatory Visit: Payer: Self-pay | Admitting: Family Medicine

## 2018-04-30 VITALS — Ht 70.0 in | Wt 199.0 lb

## 2018-04-30 DIAGNOSIS — Z125 Encounter for screening for malignant neoplasm of prostate: Secondary | ICD-10-CM

## 2018-04-30 DIAGNOSIS — E782 Mixed hyperlipidemia: Secondary | ICD-10-CM

## 2018-04-30 DIAGNOSIS — G8929 Other chronic pain: Secondary | ICD-10-CM

## 2018-04-30 DIAGNOSIS — Z79899 Other long term (current) drug therapy: Secondary | ICD-10-CM

## 2018-04-30 DIAGNOSIS — E119 Type 2 diabetes mellitus without complications: Secondary | ICD-10-CM | POA: Diagnosis not present

## 2018-04-30 DIAGNOSIS — Z79891 Long term (current) use of opiate analgesic: Secondary | ICD-10-CM

## 2018-04-30 DIAGNOSIS — M25572 Pain in left ankle and joints of left foot: Secondary | ICD-10-CM

## 2018-04-30 DIAGNOSIS — I10 Essential (primary) hypertension: Secondary | ICD-10-CM | POA: Diagnosis not present

## 2018-04-30 DIAGNOSIS — M25551 Pain in right hip: Secondary | ICD-10-CM

## 2018-04-30 LAB — POCT GLYCOSYLATED HEMOGLOBIN (HGB A1C): HEMOGLOBIN A1C: 5.6 % (ref 4.0–5.6)

## 2018-04-30 MED ORDER — SERTRALINE HCL 50 MG PO TABS: ORAL_TABLET | ORAL | 5 refills | Status: DC

## 2018-04-30 MED ORDER — OXYCODONE-ACETAMINOPHEN 10-325 MG PO TABS: ORAL_TABLET | ORAL | 0 refills | Status: DC

## 2018-04-30 MED ORDER — ROSUVASTATIN CALCIUM 40 MG PO TABS: 40.0000 mg | ORAL_TABLET | Freq: Every day | ORAL | 1 refills | Status: DC

## 2018-04-30 NOTE — Progress Notes (Signed)
Subjective:    Patient ID: Terry [name], male    DOB: Mar 28, 1961, 59 y.o.   MRN: [id number]  HPI  Patient is here today to follow up on chronic health issues.  History of hypertension he is taking Norvasc 10 mg one per day,Metoprolol 50 mg one po qd. Patient for blood pressure check up.  The patient does have hypertension.  The patient is on medication.  Patient relates compliance with meds. Todays BP reviewed with the patient. Patient denies issues with medication. Patient relates reasonable diet. Patient tries to minimize salt. Patient aware of BP goals.   Diabetes: Takes Novolog per ss,Lantus 120 units in am 80 units in pm,Victoza 1.8 Sq once per day. The patient was seen today as part of a comprehensive diabetic check up.the patient does have diabetes.  The patient follows here on a regular basis.  The patient relates medication compliance. No significant side effects to the medications. Denies any low glucose spells. Relates compliance with diet to a reasonable level. Patient does do labwork intermittently and understands the dangers of diabetes.    Results for orders placed or performed in visit on 04/30/18  POCT glycosylated hemoglobin (Hb A1C)  Result Value Ref Range   Hemoglobin A1C 5.6 4.0 - 5.6 %   HbA1c POC (<> result, manual entry)     HbA1c, POC (prediabetic range)     HbA1c, POC (controlled diabetic range)     He takes Zoloft 50 mg once per day.  Hyperlipidemia: Crestor 40 mg once per day.  This patient was seen today for chronic pain  The medication list was reviewed and updated.   -Compliance with medication: Oxycodone 10-325 mg four times per day. - Number patient states they take daily: four   -when was the last dose patient took? Last night The patient was advised the importance of maintaining medication and not using illegal substances with these.  Here for refills and follow up  The patient was educated that we can provide 3 monthly scripts for their  medication, it is their responsibility to follow the instructions.  Side effects or complications from medications: some constipaiton.  Patient is aware that pain medications are meant to minimize the severity of the pain to allow their pain levels to improve to allow for better function. They are aware of that pain medications cannot totally remove their pain.  Due for UDT ( at least once per year) : 04/30/2018  Patient under a lot of stress works long hours in addition to this his wife is disabled therefore he is essentially doing everything which puts a lot of burden on him but he denies being depressed   Patient does have ongoing trouble with reflux.  Takes medication on a regular basis.  Tries to minimize foods as best they can.  They understand the importance of dietary compliance.  May also try to avoid eating a large meal close to bedtime.  Patient denies any dysphagia denies hematochezia.  States medicine does a good job keeping the problem under good control.  Without the medication may certainly have issues.They desire to continue taking their medication. He is able to take PPI every other day for relief   Review of Systems  Constitutional: Negative for diaphoresis and fatigue.  HENT: Negative for congestion and rhinorrhea.   Respiratory: Negative for cough and shortness of breath.   Cardiovascular: Negative for chest pain and leg swelling.  Gastrointestinal: Negative for abdominal pain and diarrhea.  Skin: Negative for color  change and rash.  Neurological: Negative for dizziness and headaches.  Psychiatric/Behavioral: Negative for behavioral problems and confusion.       Objective:   Physical Exam Vitals signs reviewed.  Constitutional:      General: He is not in acute distress. HENT:     Head: Normocephalic and atraumatic.  Eyes:     General:        Right eye: No discharge.        Left eye: No discharge.  Neck:     Trachea: No tracheal deviation.  Cardiovascular:       Rate and Rhythm: Normal rate and regular rhythm.     Heart sounds: Normal heart sounds. No murmur.  Pulmonary:     Effort: Pulmonary effort is normal. No respiratory distress.     Breath sounds: Normal breath sounds.  Lymphadenopathy:     Cervical: No cervical adenopathy.  Skin:    General: Skin is warm and dry.  Neurological:     Mental Status: He is alert.     Coordination: Coordination normal.  Psychiatric:        Behavior: Behavior normal.    Patient was encouraged to get eye exam in the near future       Assessment & Plan:  The patient was seen today as part of a comprehensive visit for diabetes. The importance of keeping her A1c at or below 7 was discussed.  Importance of regular physical activity was discussed.   The importance of adherence to medication as well as a controlled low starch/sugar diet was also discussed.  Standard follow-up visit recommended.  Also patient aware failure to keep diabetes under control increases the risk of complications.  HTN- Patient was seen today as part of a visit regarding hypertension. The importance of healthy diet and regular physical activity was discussed. The importance of compliance with medications discussed.  Ideal goal is to keep blood pressure low elevated levels certainly below 937/90 when possible.  The patient was counseled that keeping blood pressure under control lessen his risk of complications.  The importance of regular follow-ups was discussed with the patient.  Low-salt diet such as DASH recommended.  Regular physical activity was recommended as well.  Patient was advised to keep regular follow-ups.  The patient was seen today as part of an evaluation regarding hyperlipidemia.  Recent lab work has been reviewed with the patient as well as the goals for good cholesterol care.  In addition to this medications have been discussed the importance of compliance with diet and medications discussed as well.  Finally  the patient is aware that poor control of cholesterol, noncompliance can dramatically increase the risk of complications. The patient will keep regular office visits and the patient does agreed to periodic lab work.  The patient was seen in followup for chronic pain. A review over at their current pain status was discussed. Drug registry was checked. Prescriptions were given. Discussion was held regarding the importance of compliance with medication as well as pain medication contract.  Time for questions regarding pain management plan occurred. Importance of regular followup visits was discussed. Patient was informed that medication may cause drowsiness and should not be combined  with other medications/alcohol or street drugs. Patient was cautioned that medication could cause drowsiness. If the patient feels medication is causing altered alertness then do not drive or operate dangerous equipment.  25 minutes was spent with the patient.  This statement verifies that 25 minutes was indeed spent with the  patient.  More than 50% of this visit-total duration of the visit-was spent in counseling and coordination of care. The issues that the patient came in for today as reflected in the diagnosis (s) please refer to documentation for further details.

## 2018-05-02 ENCOUNTER — Other Ambulatory Visit: Payer: Self-pay | Admitting: Family Medicine

## 2018-05-03 LAB — TOXASSURE SELECT 13 (MW), URINE

## 2018-06-28 ENCOUNTER — Other Ambulatory Visit: Payer: Self-pay | Admitting: Family Medicine

## 2018-07-30 ENCOUNTER — Other Ambulatory Visit: Payer: Self-pay

## 2018-07-30 ENCOUNTER — Ambulatory Visit: Payer: BLUE CROSS/BLUE SHIELD | Admitting: Family Medicine

## 2018-07-30 ENCOUNTER — Ambulatory Visit (INDEPENDENT_AMBULATORY_CARE_PROVIDER_SITE_OTHER): Payer: BLUE CROSS/BLUE SHIELD | Admitting: Family Medicine

## 2018-07-30 DIAGNOSIS — R809 Proteinuria, unspecified: Secondary | ICD-10-CM

## 2018-07-30 DIAGNOSIS — E782 Mixed hyperlipidemia: Secondary | ICD-10-CM | POA: Diagnosis not present

## 2018-07-30 DIAGNOSIS — K219 Gastro-esophageal reflux disease without esophagitis: Secondary | ICD-10-CM

## 2018-07-30 DIAGNOSIS — G8929 Other chronic pain: Secondary | ICD-10-CM

## 2018-07-30 DIAGNOSIS — M25572 Pain in left ankle and joints of left foot: Secondary | ICD-10-CM

## 2018-07-30 DIAGNOSIS — E1129 Type 2 diabetes mellitus with other diabetic kidney complication: Secondary | ICD-10-CM

## 2018-07-30 DIAGNOSIS — I10 Essential (primary) hypertension: Secondary | ICD-10-CM

## 2018-07-30 DIAGNOSIS — M1990 Unspecified osteoarthritis, unspecified site: Secondary | ICD-10-CM

## 2018-07-30 DIAGNOSIS — R1319 Other dysphagia: Secondary | ICD-10-CM

## 2018-07-30 DIAGNOSIS — R1310 Dysphagia, unspecified: Secondary | ICD-10-CM

## 2018-07-30 MED ORDER — METOPROLOL SUCCINATE ER 50 MG PO TB24: 50.0000 mg | ORAL_TABLET | Freq: Every day | ORAL | 1 refills | Status: DC

## 2018-07-30 MED ORDER — PANTOPRAZOLE SODIUM 40 MG PO TBEC: DELAYED_RELEASE_TABLET | ORAL | 1 refills | Status: DC

## 2018-07-30 MED ORDER — AMLODIPINE BESYLATE 10 MG PO TABS: ORAL_TABLET | ORAL | 1 refills | Status: DC

## 2018-07-30 MED ORDER — INDAPAMIDE 2.5 MG PO TABS: ORAL_TABLET | ORAL | 1 refills | Status: DC

## 2018-07-30 MED ORDER — LOSARTAN POTASSIUM 100 MG PO TABS: ORAL_TABLET | ORAL | 1 refills | Status: DC

## 2018-07-30 MED ORDER — OXYCODONE-ACETAMINOPHEN 10-325 MG PO TABS: ORAL_TABLET | ORAL | 0 refills | Status: DC

## 2018-07-30 MED ORDER — INSULIN GLARGINE 100 UNIT/ML SOLOSTAR PEN: PEN_INJECTOR | SUBCUTANEOUS | 0 refills | Status: DC

## 2018-07-30 MED ORDER — LIRAGLUTIDE 18 MG/3ML ~~LOC~~ SOPN: PEN_INJECTOR | SUBCUTANEOUS | 12 refills | Status: DC

## 2018-07-30 MED ORDER — SERTRALINE HCL 50 MG PO TABS: ORAL_TABLET | ORAL | 1 refills | Status: DC

## 2018-07-30 MED ORDER — ROSUVASTATIN CALCIUM 40 MG PO TABS: 40.0000 mg | ORAL_TABLET | Freq: Every day | ORAL | 1 refills | Status: DC

## 2018-07-30 NOTE — Progress Notes (Signed)
Subjective:    Patient ID: Terry [name], male    DOB: 1960/12/17, 58 y.o.   MRN: [id number]  Diabetes  He presents for his follow-up diabetic visit. He has type 2 diabetes mellitus. There are no hypoglycemic associated symptoms. There are no diabetic associated symptoms. There are no hypoglycemic complications. There are no diabetic complications.   This patient was seen today for chronic pain  The medication list was reviewed and updated.   -Compliance with medication: Patient relates good compliance  - Number patient states they take daily: 4  -when was the last dose patient took? About one hour ago  The patient was advised the importance of maintaining medication and not using illegal substances with these.  Here for refills and follow up  The patient was educated that we can provide 3 monthly scripts for their medication, it is their responsibility to follow the instructions.  Side effects or complications from medications: none  Patient is aware that pain medications are meant to minimize the severity of the pain to allow their pain levels to improve to allow for better function. They are aware of that pain medications cannot totally remove their pain.  Due for UDT ( at least once per year) :   Virtual Visit via Video Note  I connected with Terry [name] on 07/30/18 at  1:10 PM EDT by a video enabled telemedicine application and verified that I am speaking with the correct person using two identifiers.  Location: Patient: home Provider: office   I discussed the limitations of evaluation and management by telemedicine and the availability of in person appointments. The patient expressed understanding and agreed to proceed.  History of Present Illness: He also has significant osteoarthritis in his hips knees and ankles for which pain medicine does help  Patient here for follow-up regarding cholesterol.  The patient does have hyperlipidemia.  Patient does try to  maintain a reasonable diet.  Patient does take the medication on a regular basis.  Denies missing a dose.  The patient denies any obvious side effects.  Prior blood work results reviewed with the patient.  The patient is aware of his cholesterol goals and the need to keep it under good control to lessen the risk of disease.  Patient does have ongoing trouble with reflux.  Takes medication on a regular basis.  Tries to minimize foods as best they can.  They understand the importance of dietary compliance.  May also try to avoid eating a large meal close to bedtime.  Patient denies any dysphagia denies hematochezia.  States medicine does a good job keeping the problem under good control.  Without the medication may certainly have issues.They desire to continue taking their medication.     Observations/Objective:   Assessment and Plan:   Follow Up Instructions:    I discussed the assessment and treatment plan with the patient. The patient was provided an opportunity to ask questions and all were answered. The patient agreed with the plan and demonstrated an understanding of the instructions.   The patient was advised to call back or seek an in-person evaluation if the symptoms worsen or if the condition fails to improve as anticipated.  I provided 25 minutes of non-face-to-face time during this encounter.   Vicente Males, LPN        Review of Systems     Objective:   Physical Exam        Assessment & Plan:  The patient was seen in  followup for chronic pain. A review over at their current pain status was discussed. Drug registry was checked. Prescriptions were given. Discussion was held regarding the importance of compliance with medication as well as pain medication contract.  Time for questions regarding pain management plan occurred. Importance of regular followup visits was discussed. Patient was informed that medication may cause drowsiness and should not be combined   with other medications/alcohol or street drugs. Patient was cautioned that medication could cause drowsiness. If the patient feels medication is causing altered alertness then do not drive or operate dangerous equipment.  HTN- Patient was seen today as part of a visit regarding hypertension. The importance of healthy diet and regular physical activity was discussed. The importance of compliance with medications discussed.  Ideal goal is to keep blood pressure low elevated levels certainly below 035/00 when possible.  The patient was counseled that keeping blood pressure under control lessen his risk of complications.  The importance of regular follow-ups was discussed with the patient.  Low-salt diet such as DASH recommended.  Regular physical activity was recommended as well.  Patient was advised to keep regular follow-ups.  The patient was seen today as part of a comprehensive visit for diabetes. The importance of keeping her A1c at or below 7 was discussed.  Importance of regular physical activity was discussed.   The importance of adherence to medication as well as a controlled low starch/sugar diet was also discussed.  Standard follow-up visit recommended.  Also patient aware failure to keep diabetes under control increases the risk of complications.  The patient was seen today for GERD. Patient benefits from medication. Patient to continue medication. Keep all regular follow ups.  His moods overall are doing well he will continue current measures  He will do lab work in the coming months and follow-up in person in 3 months

## 2018-09-12 ENCOUNTER — Other Ambulatory Visit: Payer: Self-pay | Admitting: Family Medicine

## 2018-10-11 ENCOUNTER — Other Ambulatory Visit: Payer: Self-pay | Admitting: Family Medicine

## 2018-10-29 DIAGNOSIS — R809 Proteinuria, unspecified: Secondary | ICD-10-CM | POA: Diagnosis not present

## 2018-10-29 DIAGNOSIS — I10 Essential (primary) hypertension: Secondary | ICD-10-CM | POA: Diagnosis not present

## 2018-10-29 DIAGNOSIS — E1129 Type 2 diabetes mellitus with other diabetic kidney complication: Secondary | ICD-10-CM | POA: Diagnosis not present

## 2018-10-29 DIAGNOSIS — E782 Mixed hyperlipidemia: Secondary | ICD-10-CM | POA: Diagnosis not present

## 2018-10-30 LAB — HEMOGLOBIN A1C
Est. average glucose Bld gHb Est-mCnc: 192 mg/dL
Hgb A1c MFr Bld: 8.3 % — ABNORMAL HIGH (ref 4.8–5.6)

## 2018-10-30 LAB — BASIC METABOLIC PANEL
BUN/Creatinine Ratio: 17 (ref 9–20)
BUN: 21 mg/dL (ref 6–24)
CO2: 28 mmol/L (ref 20–29)
Calcium: 10 mg/dL (ref 8.7–10.2)
Chloride: 95 mmol/L — ABNORMAL LOW (ref 96–106)
Creatinine, Ser: 1.21 mg/dL (ref 0.76–1.27)
GFR calc Af Amer: 77 mL/min/{1.73_m2} (ref >59)
GFR calc non Af Amer: 67 mL/min/{1.73_m2} (ref >59)
Glucose: 152 mg/dL — ABNORMAL HIGH (ref 65–99)
Potassium: 4.5 mmol/L (ref 3.5–5.2)
Sodium: 140 mmol/L (ref 134–144)

## 2018-10-30 LAB — LIPID PANEL
Chol/HDL Ratio: 4.8 ratio (ref 0.0–5.0)
Cholesterol, Total: 138 mg/dL (ref 100–199)
HDL: 29 mg/dL — ABNORMAL LOW (ref >39)
LDL Calculated: 29 mg/dL (ref 0–99)
Triglycerides: 399 mg/dL — ABNORMAL HIGH (ref 0–149)
VLDL Cholesterol Cal: 80 mg/dL — ABNORMAL HIGH (ref 5–40)

## 2018-10-30 LAB — MICROALBUMIN / CREATININE URINE RATIO
Creatinine, Urine: 152.8 mg/dL
Microalb/Creat Ratio: 37 mg/g creat — ABNORMAL HIGH (ref 0–29)
Microalbumin, Urine: 55.8 ug/mL

## 2018-10-30 LAB — HEPATIC FUNCTION PANEL
ALT: 33 IU/L (ref 0–44)
AST: 27 IU/L (ref 0–40)
Albumin: 4.6 g/dL (ref 3.8–4.9)
Alkaline Phosphatase: 51 IU/L (ref 39–117)
Bilirubin Total: 0.3 mg/dL (ref 0.0–1.2)
Bilirubin, Direct: 0.11 mg/dL (ref 0.00–0.40)
Total Protein: 7.4 g/dL (ref 6.0–8.5)

## 2018-10-30 LAB — PSA: Prostate Specific Ag, Serum: 0.2 ng/mL (ref 0.0–4.0)

## 2018-10-31 ENCOUNTER — Other Ambulatory Visit: Payer: Self-pay

## 2018-10-31 ENCOUNTER — Ambulatory Visit (INDEPENDENT_AMBULATORY_CARE_PROVIDER_SITE_OTHER): Payer: BC Managed Care – PPO | Admitting: Family Medicine

## 2018-10-31 DIAGNOSIS — R809 Proteinuria, unspecified: Secondary | ICD-10-CM

## 2018-10-31 DIAGNOSIS — E7849 Other hyperlipidemia: Secondary | ICD-10-CM

## 2018-10-31 DIAGNOSIS — I10 Essential (primary) hypertension: Secondary | ICD-10-CM

## 2018-10-31 DIAGNOSIS — E1129 Type 2 diabetes mellitus with other diabetic kidney complication: Secondary | ICD-10-CM

## 2018-10-31 MED ORDER — OXYCODONE-ACETAMINOPHEN 10-325 MG PO TABS: ORAL_TABLET | ORAL | 0 refills | Status: DC

## 2018-10-31 NOTE — Progress Notes (Signed)
Subjective:    Patient ID: Terry [name], male    DOB: Aug 31, 1960, 58 y.o.   MRN: [id number]  HPI This patient was seen today for chronic pain. Takes for hips, knees and ankles.   The medication list was reviewed and updated.   -Compliance with medication: takes 4 a day  - Number patient states they take daily: 4 daily  -when was the last dose patient took? today  The patient was advised the importance of maintaining medication and not using illegal substances with these.  Here for refills and follow up  The patient was educated that we can provide 3 monthly scripts for their medication, it is their responsibility to follow the instructions.  Side effects or complications from medications: none  Patient is aware that pain medications are meant to minimize the severity of the pain to allow their pain levels to improve to allow for better function. They are aware of that pain medications cannot totally remove their pain.  Due for UDT ( at least once per year) : last one 04/30/18  Pt states his a1c was high but he has not changed anything and when he checks blood sugar it is good. It was 80 this morning. Would like to get a glucose tracker.   Virtual Visit via Telephone Note  I connected with [name] on 10/31/18 at  9:00 AM EDT by telephone and verified that I am speaking with the correct person using two identifiers.  Location: Patient: home Provider: office   I discussed the limitations, risks, security and privacy concerns of performing an evaluation and management service by telephone and the availability of in person appointments. I also discussed with the patient that there may be a patient responsible charge related to this service. The patient expressed understanding and agreed to proceed. Patient does state that the pain medicine helps him function without it he would have a very difficult time working day-to-day  History of Present Illness:     Observations/Objective:   Assessment and Plan:   Follow Up Instructions:    I discussed the assessment and treatment plan with the patient. The patient was provided an opportunity to ask questions and all were answered. The patient agreed with the plan and demonstrated an understanding of the instructions.   The patient was advised to call back or seek an in-person evaluation if the symptoms worsen or if the condition fails to improve as anticipated.  I provided 20 minutes of non-face-to-face time during this encounter. The patient was seen today as part of a comprehensive diabetic check up.the patient does have diabetes.  The patient follows here on a regular basis.  The patient relates medication compliance. No significant side effects to the medications. Denies any low glucose spells. Relates compliance with diet to a reasonable level. Patient does do labwork intermittently and understands the dangers of diabetes.  He states his sugars are running a little bit on the high side he states he does need do a better job watching his diet    Review of Systems  Constitutional: Negative for diaphoresis and fatigue.  HENT: Negative for congestion and rhinorrhea.   Respiratory: Negative for cough and shortness of breath.   Cardiovascular: Negative for chest pain and leg swelling.  Gastrointestinal: Negative for abdominal pain and diarrhea.  Skin: Negative for color change and rash.  Neurological: Negative for dizziness and headaches.  Psychiatric/Behavioral: Negative for behavioral problems and confusion.       Objective:  Physical Exam  Today's visit was via telephone Physical exam was not possible for this visit       Assessment & Plan:  The patient was seen in followup for chronic pain. A review over at their current pain status was discussed. Drug registry was checked. Prescriptions were given. Discussion was held regarding the importance of compliance with medication as  well as pain medication contract.  Time for questions regarding pain management plan occurred. Importance of regular followup visits was discussed. Patient was informed that medication may cause drowsiness and should not be combined  with other medications/alcohol or street drugs. Patient was cautioned that medication could cause drowsiness. If the patient feels medication is causing altered alertness then do not drive or operate dangerous equipment.  Drug registry was checked.  Patient reliable with medicine 3 prescriptions will be sent in  Diabetes poor control he will work much harder on diet he will work much harder with his shots we will see if regular insulin is available he did not tolerate Humalog and NovoLog is not covered  More lab work in 3 months time follow-up office visit 3 months

## 2018-11-06 ENCOUNTER — Other Ambulatory Visit: Payer: Self-pay | Admitting: Family Medicine

## 2018-11-08 ENCOUNTER — Other Ambulatory Visit: Payer: Self-pay

## 2018-11-08 ENCOUNTER — Ambulatory Visit (INDEPENDENT_AMBULATORY_CARE_PROVIDER_SITE_OTHER): Payer: BC Managed Care – PPO | Admitting: Nurse Practitioner

## 2018-11-08 ENCOUNTER — Encounter (INDEPENDENT_AMBULATORY_CARE_PROVIDER_SITE_OTHER): Payer: Self-pay | Admitting: Nurse Practitioner

## 2018-11-08 DIAGNOSIS — K2100 Gastro-esophageal reflux disease with esophagitis, without bleeding: Secondary | ICD-10-CM

## 2018-11-08 DIAGNOSIS — K219 Gastro-esophageal reflux disease without esophagitis: Secondary | ICD-10-CM | POA: Insufficient documentation

## 2018-11-08 NOTE — Progress Notes (Signed)
Subjective:    Patient ID: Terry [name], male    DOB: [birth year], 58 y.o.   MRN: [id number]  HPI   [name] is a 58 y.o. male with a past medical history of DM, HTN, osteoarthritis, reflux esophagitis and dysphagia. He presents today for his annual follow up. He remains on Pantoprazole 40mg  once daily. He denies having any dysphagia, heartburn or stomach pain. No lower abdominal pain. He is passing a normal solid stool once daily. No rectal bleeding or black stool. He underwent an EGD 08/04/2017 which showed grade C reflux esophagitis, benign appearing esophageal stenosis was dilated, a 3cm hiatal hernia, normal stomach and duodenum. A colonoscopy was done on the same date which showed one small hyperplastic polyp to the descending colon. A repeat colonoscopy in 10 years was recommended.    Past Medical History:  Diagnosis Date  . Diabetes mellitus without complication ([place]) 4656  . Hyperlipidemia   . Hypertension   . Osteoarthritis    Past Surgical History:  Procedure Laterality Date  . ANKLE SURGERY Left 2007   2 plates/screws  . COLONOSCOPY N/A 08/04/2017   Procedure: COLONOSCOPY;  Surgeon: Rogene Houston, MD;  [facility]: [facility];  Service: Endoscopy;  Laterality: N/A;  . ESOPHAGEAL DILATION N/A 08/04/2017   Procedure: ESOPHAGEAL DILATION;  Surgeon: Rogene Houston, MD;  Location: [facility];  Service: Endoscopy;  Laterality: N/A;  . ESOPHAGOGASTRODUODENOSCOPY N/A 08/04/2017   Procedure: ESOPHAGOGASTRODUODENOSCOPY (EGD);  Surgeon: Rogene Houston, MD;  Location: [facility];  Service: Endoscopy;  Laterality: N/A;  . POLYPECTOMY  08/04/2017   Procedure: POLYPECTOMY;  Surgeon: Rogene Houston, MD;  Location: [facility];  Service: Endoscopy;;   Current Outpatient Medications on File Prior to Visit  Medication Sig Dispense Refill  . amLODipine (NORVASC) 10 MG tablet TAKE ONE TABLET BY MOUTH ONCE DAILY. 90 tablet 0  . aspirin EC 81 MG tablet Take 1 tablet (81  mg total) by mouth at bedtime.    . CONTOUR NEXT TEST test strip TEST BLOOD SUGAR 4 TIMES DAILY. 100 each 2  . diclofenac (VOLTAREN) 75 MG EC tablet Take one tablet two times daily 30 tablet 0  . indapamide (LOZOL) 2.5 MG tablet TAKE (1) TABLET BY MOUTH ONCE DAILY. 90 tablet 1  . LANTUS SOLOSTAR 100 UNIT/ML Solostar Pen INJECT 90 UNITS TWICE DAILY. TITRATE TO 200 UNITS A DAY. 60 mL 0  . liraglutide (VICTOZA) 18 MG/3ML SOPN INJECT 1.8MG  SUBCUTANEOUSLY ONCE DAILY. 9 mL 12  . losartan (COZAAR) 100 MG tablet TAKE (1) TABLET BY MOUTH ONCE DAILY. 90 tablet 1  . metoprolol succinate (TOPROL-XL) 50 MG 24 hr tablet Take 1 tablet (50 mg total) by mouth daily. Take with or immediately following a meal. 90 tablet 1  . mometasone (ELOCON) 0.1 % cream Apply 1 application topically 2 (two) times daily as needed. 45 g 3  . Multiple Vitamin (MULTIVITAMIN) tablet Take 1 tablet by mouth daily.    Marland Kitchen oxyCODONE-acetaminophen (PERCOCET) 10-325 MG tablet 1 tablet 4 times daily as needed pain 120 tablet 0  . pantoprazole (PROTONIX) 40 MG tablet TAKE 1 TABLET BY MOUTH TWICE DAILY BEFORE A MEAL. 180 tablet 1  . rosuvastatin (CRESTOR) 40 MG tablet Take 1 tablet (40 mg total) by mouth daily. 90 tablet 1  . sertraline (ZOLOFT) 50 MG tablet TAKE (1) TABLET BY MOUTH ONCE DAILY. 90 tablet 1  . sildenafil (REVATIO) 20 MG tablet Take 1-5 tablets before sexual activity  as directed. No greater that 5 tablets in a day. 25 tablet 6  . insulin aspart (NOVOLOG) 100 UNIT/ML injection Inject 6 units twice daily. May titrate up to 14 units (Patient not taking: Reported on 07/30/2018) 10 mL 5  . oxyCODONE-acetaminophen (PERCOCET) 10-325 MG tablet 1 tablet 4 times daily as needed pain 120 tablet 0   No current facility-administered medications on file prior to visit.    Allergies  Allergen Reactions  . Altace [Ramipril] Cough  . Invokana [Canagliflozin] Other (See Comments)    laryngitis  . Metformin And Related Diarrhea  . Novolog  [Insulin Aspart]     intolerance      Objective:   Physical Exam BP (!) 143/66   Pulse 91   Temp 98.1 F (36.7 C) (Oral)   Resp 18   Ht 5\' 10"  (1.778 m)   Wt 201 lb 8 oz (91.4 kg)   BMI 28.91 kg/m   General: well developed male in NAD Heart: RRR, 1/6 systolic murmur Lungs: clear throughout  Abdomen: soft, nontender, no masses or organomegaly Extremities: no edema    Assessment & Plan:    1. Reflux esophagitis with a benign esophageal stricture, further dysphagia since esophagus was dilated 07/2017 -continue Pantoprazole 40mg  once daily -patient to call office if he develops difficulty swallowing or increasing reflux symptoms, on NSAIDS at risk for GERD exacerbation   2. Osteoarthritis on Diclofenac 75mg  one tab tid and ASA 81 days he does not take Diclofenac   3. Hx of a hyperplastic polyp, next colonoscopy due 07/2027 per Dr. Laural Golden

## 2018-11-08 NOTE — Patient Instructions (Signed)
1. Continue Protonix 40mg  one capsule by mouth once daily  2. Call our office if you develop any difficulty swallowing or increasing reflux symptoms  3. Follow up in our office in 1 year

## 2018-11-13 ENCOUNTER — Other Ambulatory Visit: Payer: Self-pay | Admitting: Family Medicine

## 2018-12-17 ENCOUNTER — Other Ambulatory Visit: Payer: Self-pay | Admitting: Family Medicine

## 2019-01-18 ENCOUNTER — Other Ambulatory Visit: Payer: Self-pay | Admitting: Family Medicine

## 2019-01-18 DIAGNOSIS — R1319 Other dysphagia: Secondary | ICD-10-CM

## 2019-01-18 DIAGNOSIS — K219 Gastro-esophageal reflux disease without esophagitis: Secondary | ICD-10-CM

## 2019-01-18 DIAGNOSIS — R1310 Dysphagia, unspecified: Secondary | ICD-10-CM

## 2019-01-18 NOTE — Telephone Encounter (Signed)
90-day with 1 refill 

## 2019-01-23 ENCOUNTER — Ambulatory Visit (HOSPITAL_COMMUNITY)
Admission: RE | Admit: 2019-01-23 | Discharge: 2019-01-23 | Disposition: A | Discharge status: Home / Self Care | Payer: BC Managed Care – PPO | Source: Ambulatory Visit | Attending: Family Medicine | Admitting: Family Medicine

## 2019-01-23 ENCOUNTER — Ambulatory Visit (INDEPENDENT_AMBULATORY_CARE_PROVIDER_SITE_OTHER): Payer: BC Managed Care – PPO | Admitting: Family Medicine

## 2019-01-23 ENCOUNTER — Other Ambulatory Visit: Payer: Self-pay

## 2019-01-23 VITALS — BP 122/62 | Temp 97.2°F | Ht 70.0 in | Wt 205.0 lb

## 2019-01-23 DIAGNOSIS — M79641 Pain in right hand: Secondary | ICD-10-CM | POA: Insufficient documentation

## 2019-01-23 DIAGNOSIS — M2550 Pain in unspecified joint: Secondary | ICD-10-CM

## 2019-01-23 DIAGNOSIS — E7849 Other hyperlipidemia: Secondary | ICD-10-CM | POA: Diagnosis not present

## 2019-01-23 DIAGNOSIS — E1129 Type 2 diabetes mellitus with other diabetic kidney complication: Secondary | ICD-10-CM

## 2019-01-23 DIAGNOSIS — Z23 Encounter for immunization: Secondary | ICD-10-CM | POA: Diagnosis not present

## 2019-01-23 DIAGNOSIS — I10 Essential (primary) hypertension: Secondary | ICD-10-CM | POA: Diagnosis not present

## 2019-01-23 DIAGNOSIS — R809 Proteinuria, unspecified: Secondary | ICD-10-CM

## 2019-01-23 DIAGNOSIS — M1990 Unspecified osteoarthritis, unspecified site: Secondary | ICD-10-CM

## 2019-01-23 MED ORDER — OXYCODONE-ACETAMINOPHEN 10-325 MG PO TABS: ORAL_TABLET | ORAL | 0 refills | Status: DC

## 2019-01-23 NOTE — Progress Notes (Signed)
Subjective:    Patient ID: Terry [name], male    DOB: 1960/05/24, 58 y.o.   MRN: [id number]  Diabetes He presents for his follow-up diabetic visit. He has type 2 diabetes mellitus. Pertinent negatives for hypoglycemia include no confusion, dizziness or headaches. Pertinent negatives for diabetes include no chest pain and no fatigue. He is compliant with treatment all of the time. Home blood sugar record trend: 120 -150.  Glucose readings in the morning are usually 120 later in the day under 1 40-1 50 occasionally at night they are low he often eats supper around 5 but does not eat a snack later on I told him to eat a healthy snack later in the evening he is compliant with his meds  Patient for blood pressure check up.  The patient does have hypertension.  The patient is on medication.  Patient relates compliance with meds. Todays BP reviewed with the patient. Patient denies issues with medication. Patient relates reasonable diet. Patient tries to minimize salt. Patient aware of BP goals. He does watch salt in his diet and takes his medicine regular basis  Patient here for follow-up regarding cholesterol.  The patient does have hyperlipidemia.  Patient does try to maintain a reasonable diet.  Patient does take the medication on a regular basis.  Denies missing a dose.  The patient denies any obvious side effects.  Prior blood work results reviewed with the patient.  The patient is aware of his cholesterol goals and the need to keep it under good control to lessen the risk of disease. He does take his cholesterol medicine denies any problems with  Right hand swelling and hard to make a fist. Started a few years ago but getting worse.  He relates a lot of soreness stiffness in the first MCP and second MCP has difficult time straightening it.  Painful worse in the morning  This patient was seen today for chronic pain  The medication list was reviewed and updated.   -Compliance with medication:  takes 4 a day  - Number patient states they take daily: 4  -when was the last dose patient took? today  The patient was advised the importance of maintaining medication and not using illegal substances with these.  Here for refills and follow up  The patient was educated that we can provide 3 monthly scripts for their medication, it is their responsibility to follow the instructions.  Side effects or complications from medications: none  Patient is aware that pain medications are meant to minimize the severity of the pain to allow their pain levels to improve to allow for better function. They are aware of that pain medications cannot totally remove their pain.  Due for UDT ( at least once per year) : last one 04/30/18       Review of Systems  Constitutional: Negative for diaphoresis and fatigue.  HENT: Negative for congestion and rhinorrhea.   Respiratory: Negative for cough and shortness of breath.   Cardiovascular: Negative for chest pain and leg swelling.  Gastrointestinal: Negative for abdominal pain and diarrhea.  Musculoskeletal: Positive for arthralgias, back pain and joint swelling.  Skin: Negative for color change and rash.  Neurological: Negative for dizziness and headaches.  Psychiatric/Behavioral: Negative for behavioral problems and confusion.       Objective:   Physical Exam Vitals signs reviewed.  Constitutional:      General: He is not in acute distress. HENT:     Head: Normocephalic and atraumatic.  Eyes:     General:        Right eye: No discharge.        Left eye: No discharge.  Neck:     Trachea: No tracheal deviation.  Cardiovascular:     Rate and Rhythm: Normal rate and regular rhythm.     Heart sounds: Normal heart sounds. No murmur.  Pulmonary:     Effort: Pulmonary effort is normal. No respiratory distress.     Breath sounds: Normal breath sounds.  Lymphadenopathy:     Cervical: No cervical adenopathy.  Skin:    General: Skin is warm  and dry.  Neurological:     Mental Status: He is alert.     Coordination: Coordination normal.  Psychiatric:        Behavior: Behavior normal.           Assessment & Plan:  The patient was seen in followup for chronic pain. A review over at their current pain status was discussed. Drug registry was checked. Prescriptions were given. Discussion was held regarding the importance of compliance with medication as well as pain medication contract.  Time for questions regarding pain management plan occurred. Importance of regular followup visits was discussed. Patient was informed that medication may cause drowsiness and should not be combined  with other medications/alcohol or street drugs. Patient was cautioned that medication could cause drowsiness. If the patient feels medication is causing altered alertness then do not drive or operate dangerous equipment.  3 prescriptions were sent in  The patient was seen today as part of a comprehensive visit for diabetes. The importance of keeping her A1c at or below 7 was discussed.  Importance of regular physical activity was discussed.   The importance of adherence to medication as well as a controlled low starch/sugar diet was also discussed.  Standard follow-up visit recommended.  Also patient aware failure to keep diabetes under control increases the risk of complications. Refills of medication given we talked about better eating habits and eating a snack late in the evening and keep sugar from dropping low  HTN- Patient was seen today as part of a visit regarding hypertension. The importance of healthy diet and regular physical activity was discussed. The importance of compliance with medications discussed.  Ideal goal is to keep blood pressure low elevated levels certainly below [id number] when possible.  The patient was counseled that keeping blood pressure under control lessen his risk of complications.  The importance of regular  follow-ups was discussed with the patient.  Low-salt diet such as DASH recommended.  Regular physical activity was recommended as well.  Patient was advised to keep regular follow-ups. Blood pressure under very good control continue current measures  Arthralgia of the hand recommend x-rays recommend lab work await the results may need referral to hand specialist or rheumatologist

## 2019-01-24 ENCOUNTER — Other Ambulatory Visit: Payer: Self-pay | Admitting: Family Medicine

## 2019-02-04 ENCOUNTER — Other Ambulatory Visit: Payer: Self-pay | Admitting: Family Medicine

## 2019-02-07 DIAGNOSIS — E1129 Type 2 diabetes mellitus with other diabetic kidney complication: Secondary | ICD-10-CM | POA: Diagnosis not present

## 2019-02-07 DIAGNOSIS — M1990 Unspecified osteoarthritis, unspecified site: Secondary | ICD-10-CM | POA: Diagnosis not present

## 2019-02-07 DIAGNOSIS — M2550 Pain in unspecified joint: Secondary | ICD-10-CM | POA: Diagnosis not present

## 2019-02-07 DIAGNOSIS — R809 Proteinuria, unspecified: Secondary | ICD-10-CM | POA: Diagnosis not present

## 2019-02-08 LAB — RHEUMATOID FACTOR: Rheumatoid fact SerPl-aCnc: <10 IU/mL (ref 0.0–13.9)

## 2019-02-08 LAB — C-REACTIVE PROTEIN: CRP: 1 mg/L (ref 0–10)

## 2019-02-08 LAB — HEMOGLOBIN A1C
Est. average glucose Bld gHb Est-mCnc: 194 mg/dL
Hgb A1c MFr Bld: 8.4 % — ABNORMAL HIGH (ref 4.8–5.6)

## 2019-02-08 LAB — SEDIMENTATION RATE: Sed Rate: 5 mm/hr (ref 0–30)

## 2019-02-14 ENCOUNTER — Other Ambulatory Visit: Payer: Self-pay | Admitting: *Deleted

## 2019-02-14 DIAGNOSIS — M79643 Pain in unspecified hand: Secondary | ICD-10-CM

## 2019-02-25 ENCOUNTER — Other Ambulatory Visit: Payer: Self-pay | Admitting: Family Medicine

## 2019-02-27 ENCOUNTER — Encounter: Payer: Self-pay | Admitting: Family Medicine

## 2019-03-19 ENCOUNTER — Other Ambulatory Visit: Payer: Self-pay | Admitting: Family Medicine

## 2019-04-01 ENCOUNTER — Other Ambulatory Visit: Payer: Self-pay | Admitting: Family Medicine

## 2019-04-04 ENCOUNTER — Encounter (HOSPITAL_COMMUNITY): Payer: Self-pay | Admitting: Emergency Medicine

## 2019-04-04 ENCOUNTER — Ambulatory Visit (INDEPENDENT_AMBULATORY_CARE_PROVIDER_SITE_OTHER): Payer: BC Managed Care – PPO | Admitting: Family Medicine

## 2019-04-04 ENCOUNTER — Emergency Department (HOSPITAL_COMMUNITY): Payer: BC Managed Care – PPO

## 2019-04-04 ENCOUNTER — Ambulatory Visit: Payer: BC Managed Care – PPO | Attending: Internal Medicine

## 2019-04-04 ENCOUNTER — Other Ambulatory Visit: Payer: Self-pay

## 2019-04-04 ENCOUNTER — Inpatient Hospital Stay (HOSPITAL_COMMUNITY)
Admission: EM | Admit: 2019-04-04 | Discharge: 2019-04-10 | DRG: 177 | Disposition: A | Discharge status: Home / Self Care | Payer: BC Managed Care – PPO | Attending: Family Medicine | Admitting: Family Medicine

## 2019-04-04 DIAGNOSIS — Z8349 Family history of other endocrine, nutritional and metabolic diseases: Secondary | ICD-10-CM | POA: Diagnosis not present

## 2019-04-04 DIAGNOSIS — Z833 Family history of diabetes mellitus: Secondary | ICD-10-CM | POA: Diagnosis not present

## 2019-04-04 DIAGNOSIS — K219 Gastro-esophageal reflux disease without esophagitis: Secondary | ICD-10-CM | POA: Diagnosis present

## 2019-04-04 DIAGNOSIS — E119 Type 2 diabetes mellitus without complications: Secondary | ICD-10-CM

## 2019-04-04 DIAGNOSIS — U071 COVID-19: Secondary | ICD-10-CM | POA: Diagnosis not present

## 2019-04-04 DIAGNOSIS — Z791 Long term (current) use of non-steroidal anti-inflammatories (NSAID): Secondary | ICD-10-CM | POA: Diagnosis not present

## 2019-04-04 DIAGNOSIS — Z888 Allergy status to other drugs, medicaments and biological substances status: Secondary | ICD-10-CM

## 2019-04-04 DIAGNOSIS — G9341 Metabolic encephalopathy: Secondary | ICD-10-CM | POA: Diagnosis present

## 2019-04-04 DIAGNOSIS — Z794 Long term (current) use of insulin: Secondary | ICD-10-CM

## 2019-04-04 DIAGNOSIS — Z801 Family history of malignant neoplasm of trachea, bronchus and lung: Secondary | ICD-10-CM

## 2019-04-04 DIAGNOSIS — I129 Hypertensive chronic kidney disease with stage 1 through stage 4 chronic kidney disease, or unspecified chronic kidney disease: Secondary | ICD-10-CM | POA: Diagnosis not present

## 2019-04-04 DIAGNOSIS — F419 Anxiety disorder, unspecified: Secondary | ICD-10-CM | POA: Diagnosis not present

## 2019-04-04 DIAGNOSIS — J069 Acute upper respiratory infection, unspecified: Secondary | ICD-10-CM | POA: Diagnosis not present

## 2019-04-04 DIAGNOSIS — R000 Tachycardia, unspecified: Secondary | ICD-10-CM | POA: Diagnosis not present

## 2019-04-04 DIAGNOSIS — R739 Hyperglycemia, unspecified: Secondary | ICD-10-CM

## 2019-04-04 DIAGNOSIS — R0602 Shortness of breath: Secondary | ICD-10-CM

## 2019-04-04 DIAGNOSIS — E11319 Type 2 diabetes mellitus with unspecified diabetic retinopathy without macular edema: Secondary | ICD-10-CM | POA: Diagnosis present

## 2019-04-04 DIAGNOSIS — I4891 Unspecified atrial fibrillation: Secondary | ICD-10-CM | POA: Diagnosis present

## 2019-04-04 DIAGNOSIS — N179 Acute kidney failure, unspecified: Secondary | ICD-10-CM | POA: Diagnosis present

## 2019-04-04 DIAGNOSIS — R4182 Altered mental status, unspecified: Secondary | ICD-10-CM | POA: Diagnosis present

## 2019-04-04 DIAGNOSIS — R059 Cough, unspecified: Secondary | ICD-10-CM

## 2019-04-04 DIAGNOSIS — E1165 Type 2 diabetes mellitus with hyperglycemia: Secondary | ICD-10-CM | POA: Diagnosis not present

## 2019-04-04 DIAGNOSIS — Z87891 Personal history of nicotine dependence: Secondary | ICD-10-CM | POA: Diagnosis not present

## 2019-04-04 DIAGNOSIS — I10 Essential (primary) hypertension: Secondary | ICD-10-CM | POA: Diagnosis not present

## 2019-04-04 DIAGNOSIS — E1122 Type 2 diabetes mellitus with diabetic chronic kidney disease: Secondary | ICD-10-CM | POA: Diagnosis not present

## 2019-04-04 DIAGNOSIS — Z8249 Family history of ischemic heart disease and other diseases of the circulatory system: Secondary | ICD-10-CM | POA: Diagnosis not present

## 2019-04-04 DIAGNOSIS — Z79899 Other long term (current) drug therapy: Secondary | ICD-10-CM

## 2019-04-04 DIAGNOSIS — R05 Cough: Secondary | ICD-10-CM

## 2019-04-04 DIAGNOSIS — Z20822 Contact with and (suspected) exposure to covid-19: Secondary | ICD-10-CM

## 2019-04-04 DIAGNOSIS — E785 Hyperlipidemia, unspecified: Secondary | ICD-10-CM | POA: Diagnosis not present

## 2019-04-04 DIAGNOSIS — E1110 Type 2 diabetes mellitus with ketoacidosis without coma: Secondary | ICD-10-CM | POA: Diagnosis not present

## 2019-04-04 DIAGNOSIS — Z79891 Long term (current) use of opiate analgesic: Secondary | ICD-10-CM

## 2019-04-04 DIAGNOSIS — N182 Chronic kidney disease, stage 2 (mild): Secondary | ICD-10-CM | POA: Diagnosis not present

## 2019-04-04 DIAGNOSIS — M1990 Unspecified osteoarthritis, unspecified site: Secondary | ICD-10-CM | POA: Diagnosis present

## 2019-04-04 DIAGNOSIS — E876 Hypokalemia: Secondary | ICD-10-CM | POA: Diagnosis not present

## 2019-04-04 DIAGNOSIS — R0789 Other chest pain: Secondary | ICD-10-CM | POA: Diagnosis not present

## 2019-04-04 DIAGNOSIS — F329 Major depressive disorder, single episode, unspecified: Secondary | ICD-10-CM | POA: Diagnosis present

## 2019-04-04 DIAGNOSIS — Z781 Physical restraint status: Secondary | ICD-10-CM

## 2019-04-04 DIAGNOSIS — Z7982 Long term (current) use of aspirin: Secondary | ICD-10-CM | POA: Diagnosis not present

## 2019-04-04 LAB — BASIC METABOLIC PANEL
Anion gap: >20 — ABNORMAL HIGH (ref 5–15)
Anion gap: 22 — ABNORMAL HIGH (ref 5–15)
BUN: 41 mg/dL — ABNORMAL HIGH (ref 6–20)
BUN: 40 mg/dL — ABNORMAL HIGH (ref 6–20)
CO2: 20 mmol/L — ABNORMAL LOW (ref 22–32)
CO2: 13 mmol/L — ABNORMAL LOW (ref 22–32)
Calcium: 9.7 mg/dL (ref 8.9–10.3)
Calcium: 8.2 mg/dL — ABNORMAL LOW (ref 8.9–10.3)
Chloride: 88 mmol/L — ABNORMAL LOW (ref 98–111)
Chloride: 96 mmol/L — ABNORMAL LOW (ref 98–111)
Creatinine, Ser: 1.82 mg/dL — ABNORMAL HIGH (ref 0.61–1.24)
Creatinine, Ser: 1.88 mg/dL — ABNORMAL HIGH (ref 0.61–1.24)
GFR calc Af Amer: 47 mL/min — ABNORMAL LOW (ref >60)
GFR calc Af Amer: 45 mL/min — ABNORMAL LOW (ref >60)
GFR calc non Af Amer: 40 mL/min — ABNORMAL LOW (ref >60)
GFR calc non Af Amer: 39 mL/min — ABNORMAL LOW (ref >60)
Glucose, Bld: 498 mg/dL — ABNORMAL HIGH (ref 70–99)
Glucose, Bld: 487 mg/dL — ABNORMAL HIGH (ref 70–99)
Potassium: 3.5 mmol/L (ref 3.5–5.1)
Potassium: 2.9 mmol/L — ABNORMAL LOW (ref 3.5–5.1)
Sodium: 129 mmol/L — ABNORMAL LOW (ref 135–145)
Sodium: 131 mmol/L — ABNORMAL LOW (ref 135–145)

## 2019-04-04 LAB — CBC
HCT: 46.7 % (ref 39.0–52.0)
Hemoglobin: 16.9 g/dL (ref 13.0–17.0)
MCH: 30.7 pg (ref 26.0–34.0)
MCHC: 36.2 g/dL — ABNORMAL HIGH (ref 30.0–36.0)
MCV: 84.8 fL (ref 80.0–100.0)
Platelets: 147 10*3/uL — ABNORMAL LOW (ref 150–400)
RBC: 5.51 MIL/uL (ref 4.22–5.81)
RDW: 11.8 % (ref 11.5–15.5)
WBC: 7 10*3/uL (ref 4.0–10.5)
nRBC: 0 % (ref 0.0–0.2)

## 2019-04-04 LAB — LACTIC ACID, PLASMA
Lactic Acid, Venous: 2.5 mmol/L (ref 0.5–1.9)
Lactic Acid, Venous: 8.9 mmol/L (ref 0.5–1.9)

## 2019-04-04 LAB — RAPID URINE DRUG SCREEN, HOSP PERFORMED
Amphetamines: NOT DETECTED
Barbiturates: NOT DETECTED
Benzodiazepines: POSITIVE — AB
Cocaine: NOT DETECTED
Opiates: NOT DETECTED
Tetrahydrocannabinol: NOT DETECTED

## 2019-04-04 LAB — CBG MONITORING, ED
Glucose-Capillary: 538 mg/dL (ref 70–99)
Glucose-Capillary: 526 mg/dL (ref 70–99)
Glucose-Capillary: 342 mg/dL — ABNORMAL HIGH (ref 70–99)
Glucose-Capillary: 454 mg/dL — ABNORMAL HIGH (ref 70–99)
Glucose-Capillary: 289 mg/dL — ABNORMAL HIGH (ref 70–99)
Glucose-Capillary: 199 mg/dL — ABNORMAL HIGH (ref 70–99)

## 2019-04-04 LAB — TROPONIN I (HIGH SENSITIVITY)
Troponin I (High Sensitivity): 4 ng/L (ref <18)
Troponin I (High Sensitivity): 8 ng/L (ref <18)

## 2019-04-04 LAB — PROTIME-INR
INR: 0.9 (ref 0.8–1.2)
Prothrombin Time: 11.6 seconds (ref 11.4–15.2)

## 2019-04-04 LAB — APTT: aPTT: 28 seconds (ref 24–36)

## 2019-04-04 LAB — ETHANOL: Alcohol, Ethyl (B): <10 mg/dL (ref <10)

## 2019-04-04 LAB — D-DIMER, QUANTITATIVE: D-Dimer, Quant: 0.31 ug/mL-FEU (ref 0.00–0.50)

## 2019-04-04 LAB — POC SARS CORONAVIRUS 2 AG -  ED: SARS Coronavirus 2 Ag: POSITIVE — AB

## 2019-04-04 MED ORDER — HALOPERIDOL LACTATE 5 MG/ML IJ SOLN
10.0000 mg | Freq: Once | INTRAMUSCULAR | Status: AC
  Administered 2019-04-04: 10 mg via INTRAMUSCULAR
  Filled 2019-04-04: qty 2

## 2019-04-04 MED ORDER — LORAZEPAM 2 MG/ML IJ SOLN
1.0000 mg | Freq: Once | INTRAMUSCULAR | Status: AC
  Administered 2019-04-04: 1 mg via INTRAVENOUS
  Filled 2019-04-04: qty 1

## 2019-04-04 MED ORDER — ACETAMINOPHEN 650 MG RE SUPP
650.0000 mg | Freq: Once | RECTAL | Status: AC
  Administered 2019-04-04: 650 mg via RECTAL
  Filled 2019-04-04: qty 1

## 2019-04-04 MED ORDER — SODIUM CHLORIDE 0.9 % IV BOLUS
1000.0000 mL | INTRAVENOUS | Status: AC
  Administered 2019-04-04: 1000 mL via INTRAVENOUS

## 2019-04-04 MED ORDER — METRONIDAZOLE IN NACL 5-0.79 MG/ML-% IV SOLN
500.0000 mg | Freq: Once | INTRAVENOUS | Status: AC
  Administered 2019-04-04: 500 mg via INTRAVENOUS
  Filled 2019-04-04: qty 100

## 2019-04-04 MED ORDER — SODIUM CHLORIDE 0.9 % IV SOLN: INTRAVENOUS | Status: DC

## 2019-04-04 MED ORDER — DEXMEDETOMIDINE HCL IN NACL 200 MCG/50ML IV SOLN: 0.4000 ug/kg/h | INTRAVENOUS | Status: DC

## 2019-04-04 MED ORDER — LORAZEPAM 2 MG/ML IJ SOLN
1.0000 mg | Freq: Once | INTRAMUSCULAR | Status: AC
  Administered 2019-04-04: 1 mg via INTRAVENOUS

## 2019-04-04 MED ORDER — SODIUM CHLORIDE 0.9 % IV BOLUS
1000.0000 mL | Freq: Once | INTRAVENOUS | Status: AC
  Administered 2019-04-04: 1000 mL via INTRAVENOUS

## 2019-04-04 MED ORDER — VANCOMYCIN HCL 1500 MG/300ML IV SOLN
1500.0000 mg | Freq: Once | INTRAVENOUS | Status: AC
  Administered 2019-04-04: 1500 mg via INTRAVENOUS
  Filled 2019-04-04: qty 300

## 2019-04-04 MED ORDER — VANCOMYCIN HCL IN DEXTROSE 1-5 GM/200ML-% IV SOLN
1000.0000 mg | Freq: Once | INTRAVENOUS | Status: DC
  Filled 2019-04-04: qty 200

## 2019-04-04 MED ORDER — DEXTROSE 50 % IV SOLN: 0.0000 mL | INTRAVENOUS | Status: DC | PRN

## 2019-04-04 MED ORDER — INSULIN REGULAR(HUMAN) IN NACL 100-0.9 UT/100ML-% IV SOLN
INTRAVENOUS | Status: DC
  Administered 2019-04-04: 13 [IU]/h via INTRAVENOUS
  Filled 2019-04-04: qty 100

## 2019-04-04 MED ORDER — DEXMEDETOMIDINE HCL IN NACL 200 MCG/50ML IV SOLN
0.4000 ug/kg/h | INTRAVENOUS | Status: DC
  Filled 2019-04-04: qty 50

## 2019-04-04 MED ORDER — LORAZEPAM 2 MG/ML IJ SOLN
INTRAMUSCULAR | Status: AC
  Administered 2019-04-04: 1 mg
  Filled 2019-04-04: qty 1

## 2019-04-04 MED ORDER — DEXMEDETOMIDINE HCL IN NACL 400 MCG/100ML IV SOLN
0.4000 ug/kg/h | INTRAVENOUS | Status: DC
  Administered 2019-04-04: 0.4 ug/kg/h via INTRAVENOUS
  Administered 2019-04-05: 0.798 ug/kg/h via INTRAVENOUS
  Administered 2019-04-05: 1.2 ug/kg/h via INTRAVENOUS
  Filled 2019-04-04 (×5): qty 100

## 2019-04-04 MED ORDER — SODIUM CHLORIDE 0.9 % IV SOLN
2.0000 g | Freq: Once | INTRAVENOUS | Status: DC
  Filled 2019-04-04: qty 2

## 2019-04-04 MED ORDER — DEXMEDETOMIDINE BOLUS VIA INFUSION
1.0000 ug/kg | Freq: Once | INTRAVENOUS | Status: AC
  Administered 2019-04-04: 86.2 ug via INTRAVENOUS
  Filled 2019-04-04: qty 87

## 2019-04-04 MED ORDER — LACTATED RINGERS IV BOLUS
250.0000 mL | Freq: Once | INTRAVENOUS | Status: AC
  Administered 2019-04-04: 250 mL via INTRAVENOUS

## 2019-04-04 MED ORDER — DEXTROSE-NACL 5-0.45 % IV SOLN: INTRAVENOUS | Status: DC

## 2019-04-04 MED ORDER — DIPHENHYDRAMINE HCL 50 MG/ML IJ SOLN
12.5000 mg | Freq: Once | INTRAMUSCULAR | Status: AC
  Administered 2019-04-04: 12.5 mg via INTRAVENOUS
  Filled 2019-04-04: qty 1

## 2019-04-04 MED ORDER — SODIUM CHLORIDE 0.9 % IV SOLN
2.0000 g | Freq: Two times a day (BID) | INTRAVENOUS | Status: DC
  Administered 2019-04-05 – 2019-04-07 (×5): 2 g via INTRAVENOUS
  Filled 2019-04-04 (×5): qty 2

## 2019-04-04 MED ORDER — ZIPRASIDONE MESYLATE 20 MG IM SOLR
10.0000 mg | Freq: Once | INTRAMUSCULAR | Status: AC
  Administered 2019-04-04: 10 mg via INTRAMUSCULAR
  Filled 2019-04-04: qty 20

## 2019-04-04 MED ORDER — VANCOMYCIN HCL 1250 MG/250ML IV SOLN
1250.0000 mg | INTRAVENOUS | Status: DC
  Administered 2019-04-05 – 2019-04-06 (×2): 1250 mg via INTRAVENOUS
  Filled 2019-04-04 (×2): qty 250

## 2019-04-04 MED ORDER — POTASSIUM CHLORIDE 10 MEQ/100ML IV SOLN
10.0000 meq | INTRAVENOUS | Status: AC
  Administered 2019-04-04 (×2): 10 meq via INTRAVENOUS
  Filled 2019-04-04 (×2): qty 100

## 2019-04-04 NOTE — ED Notes (Signed)
Wife called & updated on pt care

## 2019-04-04 NOTE — Progress Notes (Signed)
Pharmacy Antibiotic Note  Terry [name] is a 59 y.o. male admitted on 04/04/2019 with sepsis.  Pharmacy has been consulted for Vancomycin and Cefepime dosing.  SCr up to 1.88 (was 1.2 ~ 35mos ago)  Plan: Cefepime 2gm IV q12h Vancomycin 1500mg  IV now then 1250 mg IV Q 24 hrs. Goal AUC 400-550. Expected AUC: 500 SCr used: 1.88 Will f/u renal function, micro data, and pt's clinical condition Vanc levels prn   Height: 5\' 9"  (175.3 cm) Weight: 190 lb (86.2 kg) IBW/kg (Calculated) : 70.7  Temp (24hrs), Avg:101.4 F (38.6 C), Min:98.8 F (37.1 C), Max:103.1 F (39.5 C)  Recent Labs  Lab 04/04/19 1728 04/04/19 2026  WBC 7.0  --   CREATININE 1.82* 1.88*  LATICACIDVEN 2.5* 8.9*    Estimated Creatinine Clearance: 47.2 mL/min (A) (by C-G formula based on SCr of 1.88 mg/dL (H)).    Allergies  Allergen Reactions  . Altace [Ramipril] Cough  . Invokana [Canagliflozin] Other (See Comments)    laryngitis  . Metformin And Related Diarrhea  . Novolog [Insulin Aspart]     intolerance    Antimicrobials this admission: 1/7 Vanc >>  1/7 Cefepime >>   Microbiology results: 1/7 BCx:  1/7 UCx:    Thank you for allowing pharmacy to be a part of this patient's care.  Sherlon Handing, PharmD, BCPS Please see amion for complete clinical pharmacist phone list 04/04/2019 11:37 PM

## 2019-04-04 NOTE — Progress Notes (Signed)
   Subjective:    Patient ID: Terry [name], male    DOB: 1960-04-15, 59 y.o.   MRN: [id number]  URI  This is a new problem. The current episode started in the past 7 days. Associated symptoms include congestion and headaches. Associated symptoms comments: No energy.   Wife and step son positive for Covid Initially patient was worked up for a virtual visit late on Thursday afternoon but when the patient went to the testing site he was so short of breath they sent him immediately to the ER Please see ER note  Review of Systems  HENT: Positive for congestion.   Neurological: Positive for headaches.   Virtual Visit via Video Note  I connected with Terry [name] on 04/04/19 at  4:10 PM EST by a video enabled telemedicine application and verified that I am speaking with the correct person using two identifiers.  Location: Patient: home Provider: office   I discussed the limitations of evaluation and management by telemedicine and the availability of in person appointments. The patient expressed understanding and agreed to proceed.  History of Present Illness:    Observations/Objective:   Assessment and Plan:   Follow Up Instructions:    I discussed the assessment and treatment plan with the patient. The patient was provided an opportunity to ask questions and all were answered. The patient agreed with the plan and demonstrated an understanding of the instructions.   The patient was advised to call back or seek an in-person evaluation if the symptoms worsen or if the condition fails to improve as anticipated.  I provided 0 minutes of non-face-to-face time during this encounter.        Objective:   Physical Exam        Assessment & Plan:  Please see ER note No official visit for today

## 2019-04-04 NOTE — Addendum Note (Signed)
Addended by: Kathlene November on: 04/04/2019 03:50 PM   Modules accepted: Orders

## 2019-04-04 NOTE — ED Provider Notes (Signed)
[facility] EMERGENCY DEPARTMENT Provider Note   CSN: [id number] Arrival date & time: 04/04/19  1547     History Chief Complaint  Patient presents with  . Shortness of Breath    Terry [name] is a 59 y.o. male.  HPI      Terry [name] is a 59 y.o. male with a past medical history of diabetes, hypertension, and hyperlipidemia.  he presents to the Emergency Department complaining of shortness of breath, generalized weakness, body aches and fatigue.  Symptoms have been present all week and gradually worsening.  His wife and son are sick with similar symptoms and recently tested positive for COVID.  He denies fever at home, chills, nausea, vomiting and diarrhea.  He states his blood sugars have been running high and he continues to take his insulin daily.  Nothing makes his symptoms better or worse.      Past Medical History:  Diagnosis Date  . Diabetes mellitus without complication ([place]) [id number]  . Hyperlipidemia   . Hypertension   . Osteoarthritis     Patient Active Problem List   Diagnosis Date Noted  . GERD (gastroesophageal reflux disease) 11/08/2018  . Osteoarthritis 10/30/2017  . Esophageal dysphagia 06/19/2017  . Encounter for screening colonoscopy 06/19/2017  . Special screening for malignant neoplasms, colon 03/01/2017  . Diabetes mellitus with proteinuria ([place]) 07/01/2015  . Chronic pain of left ankle 08/01/2014  . Right hip pain 08/01/2014  . DM type 2 without retinopathy ([place]) 08/27/2012  . Essential hypertension, benign 08/27/2012  . Hyperlipidemia 08/27/2012  . Arthralgia 08/27/2012    Past Surgical History:  Procedure Laterality Date  . ANKLE SURGERY Left 2007   2 plates/screws  . COLONOSCOPY N/A 08/04/2017   Procedure: COLONOSCOPY;  Surgeon: Rogene Houston, MD;  Location: [facility] ENDO SU[facility];  Service: Endoscopy;  Laterality: N/A;  . ESOPHAGEAL DILATION N/A 08/04/2017   Procedure: ESOPHAGEAL DILATION;  Surgeon: Rogene Houston, MD;  Location: [facility] ENDO  SUITE;  Service: Endoscopy;  Laterality: N/A;  . ESOPHAGOGASTRODUODENOSCOPY N/A 08/04/2017   Procedure: ESOPHAGOGASTRODUODENOSCOPY (EGD);  Surgeon: Rogene Houston, MD;  Location: [facility] END[facility] SUITE;  Service: Endoscopy;  Laterality: N/A;  . POLYPECTOMY  08/04/2017   Procedure: POLYPECTOMY;  Surgeon: Rogene Houston, MD;  Location: [facility];  Service: Endoscopy;;       Family History  Problem Relation Age of Onset  . Hypertension Mother   . Diabetes Mother   . Heart disease Mother   . Hyperlipidemia Mother   . Cancer Father        lung    Social History   Tobacco Use  . Smoking status: Former Smoker    Quit date: 06/15/1992    Years since quitting: 26.8  . Smokeless tobacco: Never Used  Substance Use Topics  . Alcohol use: Never  . Drug use: Not Currently    Types: Marijuana    Comment: last used 1970    Home Medications Prior to Admission medications   Medication Sig Start Date End Date Taking? Authorizing Provider  amLODipine (NORVASC) 10 MG tablet TAKE ONE TABLET BY MOUTH ONCE DAILY. 11/06/18   Kathyrn Drown, MD  aspirin EC 81 MG tablet Take 1 tablet (81 mg total) by mouth at bedtime. 08/07/17   Rogene Houston, MD  BD PEN NEEDLE NANO U/F 32G X 4 MM MISC INJECT 4 TIMES DAILY AS DIRECTED. 12/17/18   Kathyrn Drown, MD  CONTOUR NEXT TEST test strip TEST BLOOD SUGAR 4  TIMES DAILY. 12/20/17   Kathyrn Drown, MD  diclofenac (VOLTAREN) 75 MG EC tablet Take one tablet two times daily 10/30/17   Luking, Elayne Snare, MD  indapamide (LOZOL) 2.5 MG tablet TAKE ONE TABLET BY MOUTH ONCE DAILY. 03/19/19   Kathyrn Drown, MD  insulin aspart (NOVOLOG) 100 UNIT/ML injection Inject 6 units twice daily. May titrate up to 14 units Patient not taking: Reported on 07/30/2018 01/26/18   Kathyrn Drown, MD  LANTUS SOLOSTAR 100 UNIT/ML Solostar Pen INJECT 90 UNITS TWICE DAILY. TITRATE TO 200 UNITS A DAY. 04/02/19   Kathyrn Drown, MD  liraglutide (VICTOZA) 18 MG/3ML SOPN INJECT 1.8MG  SUBCUTANEOUSLY  ONCE DAILY. 07/30/18   Kathyrn Drown, MD  losartan (COZAAR) 100 MG tablet TAKE ONE TABLET BY MOUTH ONCE DAILY. 03/19/19   Kathyrn Drown, MD  metoprolol succinate (TOPROL-XL) 50 MG 24 hr tablet TAKE ONE TABLET BY MOUTH ONCE DAILY. 02/04/19   Kathyrn Drown, MD  mometasone (ELOCON) 0.1 % cream Apply 1 application topically 2 (two) times daily as needed. 05/01/17   Kathyrn Drown, MD  Multiple Vitamin (MULTIVITAMIN) tablet Take 1 tablet by mouth daily.    [provider]  oxyCODONE-acetaminophen (PERCOCET) 10-325 MG tablet 1 tablet 4 times daily as needed pain 01/23/19   Kathyrn Drown, MD  oxyCODONE-acetaminophen (PERCOCET) 10-325 MG tablet 1 tablet 4 times daily as needed pain 01/23/19   Kathyrn Drown, MD  oxyCODONE-acetaminophen (PERCOCET) 10-325 MG tablet 1 tablet 4 times daily as needed for pain 01/23/19   Luking, Scott A, MD  pantoprazole (PROTONIX) 40 MG tablet TAKE 1 TABLET BY MOUTH TWICE DAILY, BEFORE A MEAL. 01/18/19   Kathyrn Drown, MD  rosuvastatin (CRESTOR) 40 MG tablet TAKE ONE TABLET BY MOUTH ONCE DAILY. 01/18/19   Kathyrn Drown, MD  sertraline (ZOLOFT) 50 MG tablet TAKE (1) TABLET BY MOUTH ONCE DAILY. 01/24/19   Kathyrn Drown, MD  sildenafil (REVATIO) 20 MG tablet Take 1-5 tablets before sexual activity as directed. No greater that 5 tablets in a day. 01/06/17   Mikey Kirschner, MD    Allergies    Altace [ramipril], Invokana [canagliflozin], Metformin and related, and Novolog [insulin aspart]  Review of Systems   Review of Systems  Constitutional: Positive for fatigue. Negative for appetite change, chills and fever.  HENT: Negative for congestion, sore throat and trouble swallowing.   Respiratory: Positive for chest tightness and shortness of breath. Negative for cough and wheezing.   Cardiovascular: Positive for chest pain.  Gastrointestinal: Negative for abdominal pain, nausea and vomiting.  Genitourinary: Negative for decreased urine volume and dysuria.    Musculoskeletal: Positive for myalgias. Negative for arthralgias, gait problem, neck pain and neck stiffness.  Skin: Negative for rash.  Neurological: Negative for dizziness, weakness and numbness.  Hematological: Negative for adenopathy.    Physical Exam Updated Vital Signs BP (!) 164/98 (BP Location: Right Arm)   Pulse (!) 119   Temp 98.8 F (37.1 C) (Oral)   Resp 18   Ht 5\' 9"  (1.753 m)   Wt 86.2 kg   SpO2 97%   BMI 28.06 kg/m   Physical Exam Vitals and nursing note reviewed.  Constitutional:      Appearance: He is well-developed. He is not ill-appearing or toxic-appearing.  HENT:     Head: Normocephalic.     Mouth/Throat:     Mouth: Mucous membranes are moist.  Cardiovascular:     Rate and Rhythm: Normal rate  and regular rhythm.     Pulses: Normal pulses.  Pulmonary:     Effort: Pulmonary effort is normal.     Breath sounds: Normal breath sounds. No stridor. No wheezing.  Chest:     Chest wall: No tenderness.  Abdominal:     General: There is no distension.     Palpations: Abdomen is soft.     Tenderness: There is no abdominal tenderness. There is no guarding.  Musculoskeletal:        General: No tenderness. Normal range of motion.     Cervical back: Normal range of motion.     Right lower leg: No edema.     Left lower leg: No edema.  Skin:    General: Skin is warm.     Capillary Refill: Capillary refill takes less than 2 seconds.     Findings: No erythema or rash.  Neurological:     General: No focal deficit present.     Mental Status: He is alert.     Sensory: No sensory deficit.     Motor: No weakness.     ED Results / Procedures / Treatments   Labs (all labs ordered are listed, but only abnormal results are displayed) Labs Reviewed  BASIC METABOLIC PANEL - Abnormal; Notable for the following components:      Result Value   Sodium 129 (*)    Chloride 88 (*)    CO2 20 (*)    Glucose, Bld 498 (*)    BUN 41 (*)    Creatinine, Ser 1.82 (*)     GFR calc non Af Amer 40 (*)    GFR calc Af Amer 47 (*)    Anion gap >20 (*)    All other components within normal limits  CBC - Abnormal; Notable for the following components:   MCHC 36.2 (*)    Platelets 147 (*)    All other components within normal limits  LACTIC ACID, PLASMA - Abnormal; Notable for the following components:   Lactic Acid, Venous 2.5 (*)    All other components within normal limits  CBG MONITORING, ED - Abnormal; Notable for the following components:   Glucose-Capillary 526 (*)    All other components within normal limits  POC SARS CORONAVIRUS 2 AG -  ED - Abnormal; Notable for the following components:   SARS Coronavirus 2 Ag POSITIVE (*)    All other components within normal limits  CBG MONITORING, ED - Abnormal; Notable for the following components:   Glucose-Capillary 538 (*)    All other components within normal limits  CULTURE, BLOOD (ROUTINE X 2)  CULTURE, BLOOD (ROUTINE X 2)  D-DIMER, QUANTITATIVE (NOT AT [facility])  ETHANOL  URINALYSIS, ROUTINE W REFLEX MICROSCOPIC  OSMOLALITY  RAPID URINE DRUG SCREEN, HOSP PERFORMED  LACTIC ACID, PLASMA  CBG MONITORING, ED  TROPONIN I (HIGH SENSITIVITY)  TROPONIN I (HIGH SENSITIVITY)    EKG EKG Interpretation  Date/Time:  Thursday April 04 2019 16:03:49 EST Ventricular Rate:  116 PR Interval:  148 QRS Duration: 78 QT Interval:  350 QTC Calculation: 486 R Axis:   42 Text Interpretation: Sinus tachycardia Nonspecific ST abnormality Abnormal ECG Confirmed by Nat Christen [id number]) on 04/04/2019 4:57:32 PM   Radiology DG Chest Port 1 View  Result Date: 04/04/2019 CLINICAL DATA:  Weakness shortness of breath EXAM: PORTABLE CHEST 1 VIEW COMPARISON:  None. FINDINGS: Low lung volumes. No focal airspace disease or effusion. Normal heart size. No pneumothorax IMPRESSION: No active disease.  Low  lung volume Electronically Signed   By: Donavan Foil M.D.   On: 04/04/2019 18:38    Procedures Procedures (including critical  care time)  Medications Ordered in ED Medications  acetaminophen (TYLENOL) suppository 650 mg (has no administration in time range)  insulin regular, human (MYXREDLIN) 100 units/ 100 mL infusion (13 Units/hr Intravenous New Bag/Given 04/04/19 2001)  0.9 %  sodium chloride infusion (has no administration in time range)  dextrose 5 %-0.45 % sodium chloride infusion (has no administration in time range)  dextrose 50 % solution 0-50 mL (has no administration in time range)  potassium chloride 10 mEq in 100 mL IVPB (10 mEq Intravenous New Bag/Given 04/04/19 2003)  dexmedetomidine (PRECEDEX) bolus via infusion 86.2 mcg (has no administration in time range)  dexmedetomidine (PRECEDEX) 200 MCG/50ML (4 mcg/mL) infusion (has no administration in time range)  sodium chloride 0.9 % bolus 1,000 mL (0 mLs Intravenous Stopped 04/04/19 1836)  LORazepam (ATIVAN) injection 1 mg (1 mg Intravenous Given 04/04/19 1754)  acetaminophen (TYLENOL) suppository 650 mg (650 mg Rectal Given 04/04/19 1836)  ziprasidone (GEODON) injection 10 mg (10 mg Intramuscular Given 04/04/19 1832)  LORazepam (ATIVAN) injection 1 mg (1 mg Intravenous Given 04/04/19 1918)  sodium chloride 0.9 % bolus 1,000 mL (1,000 mLs Intravenous New Bag/Given 04/04/19 1946)  LORazepam (ATIVAN) 2 MG/ML injection (1 mg  Given 04/04/19 1948)  LORazepam (ATIVAN) injection 1 mg (1 mg Intravenous Given 04/04/19 1947)  sodium chloride 0.9 % bolus 1,000 mL (1,000 mLs Intravenous New Bag/Given 04/04/19 2005)  haloperidol lactate (HALDOL) injection 10 mg (10 mg Intramuscular Given 04/04/19 1948)  diphenhydrAMINE (BENADRYL) injection 12.5 mg (12.5 mg Intravenous Given 04/04/19 1947)  LORazepam (ATIVAN) injection 1 mg (1 mg Intravenous Given 04/04/19 1950)    ED Course  I have reviewed the triage vital signs and the nursing notes.  Pertinent labs & imaging results that were available during my care of the patient were reviewed by me and considered in my medical decision making (see  chart for details).    MDM Rules/Calculators/A&P                      Pt alert and mentating well on my initial exam.  Vitals reviewed.  Family members recently tested positive for COVID.  Pt's symptoms very suggestive of COVID.  He is also hyperglycemic.  Labs indicate a mildly elevated anion gap.  IV insulin, IVF's ordered.     I was notified by nursing staff that patient becoming combative and pulling at IV site.  Ativan ordered, but he continues to fight with staff and uncooperative for treatment needed.  Discussed with Dr. Lacinda Axon and [place] ordered.   No improvement with medications, additional meds given and I have spoken with his wife who states that he has been confused in the past when his blood sugars have been high.  AMS not evident on initial evaluation, patient also seen by Dr. Lacinda Axon.  Initially it was thought that his clinical picture was related to his hyperglycemic state.  the cause of abrupt change in mental status remains unclear.     Patient maintaining O2 saturation above 95% on RA  CRITICAL CARE Performed by: Jetson Pickrel Total critical care time: 60 minutes Critical care time was exclusive of separately billable procedures and treating other patients. Critical care was necessary to treat or prevent imminent or life-threatening deterioration. Critical care was time spent personally by me on the following activities: development of treatment plan with patient and/or  surrogate as well as nursing, discussions with consultants, evaluation of patient's response to treatment, examination of patient, obtaining history from patient or surrogate, ordering and performing treatments and interventions, ordering and review of laboratory studies, ordering and review of radiographic studies, pulse oximetry and re-evaluation of patient's condition.   Consulted hospitalist, Dr. Darrick Meigs who agrees to admit.   Terry [name] was evaluated in Emergency Department on 04/05/2019 for the symptoms  described in the history of present illness. He was evaluated in the context of the global COVID-19 pandemic, which necessitated consideration that the patient might be at risk for infection with the SARS-CoV-2 virus that causes COVID-19. Institutional protocols and algorithms that pertain to the evaluation of patients at risk for COVID-19 are in a state of rapid change based on information released by regulatory bodies including the CDC and federal and state organizations. These policies and algorithms were followed during the patient's care in the ED.  Final Clinical Impression(s) / ED Diagnoses Final diagnoses:  COVID-19  Hyperglycemia  Altered mental status, unspecified altered mental status type    Rx / DC Orders ED Discharge Orders    None       Bufford Lope 04/05/19 0129    Nat Christen, MD 04/05/19 2219

## 2019-04-04 NOTE — ED Notes (Signed)
Date and time results received: 04/04/19 2220 (use smartphrase ".now" to insert current time)  Test: Lactic Acid Critical Value: 8.9  Name of Provider Notified: T.Triplett, pac  Orders Received? Or Actions Taken?:

## 2019-04-04 NOTE — ED Triage Notes (Signed)
Pt reports weakness, shortness of breath and cough for last several days. Pt denies any n/v/d,fever. Pt reports high blood sugar at home. cbg in triage 538. Pt reports wife and son have same symptoms but have not been checked out.

## 2019-04-05 ENCOUNTER — Other Ambulatory Visit: Payer: BC Managed Care – PPO

## 2019-04-05 DIAGNOSIS — Z833 Family history of diabetes mellitus: Secondary | ICD-10-CM | POA: Diagnosis not present

## 2019-04-05 DIAGNOSIS — G9341 Metabolic encephalopathy: Secondary | ICD-10-CM | POA: Diagnosis present

## 2019-04-05 DIAGNOSIS — Z8249 Family history of ischemic heart disease and other diseases of the circulatory system: Secondary | ICD-10-CM | POA: Diagnosis not present

## 2019-04-05 DIAGNOSIS — F329 Major depressive disorder, single episode, unspecified: Secondary | ICD-10-CM | POA: Diagnosis present

## 2019-04-05 DIAGNOSIS — I4891 Unspecified atrial fibrillation: Secondary | ICD-10-CM | POA: Diagnosis not present

## 2019-04-05 DIAGNOSIS — R739 Hyperglycemia, unspecified: Secondary | ICD-10-CM | POA: Diagnosis not present

## 2019-04-05 DIAGNOSIS — Z794 Long term (current) use of insulin: Secondary | ICD-10-CM | POA: Diagnosis not present

## 2019-04-05 DIAGNOSIS — R4182 Altered mental status, unspecified: Secondary | ICD-10-CM | POA: Diagnosis present

## 2019-04-05 DIAGNOSIS — Z781 Physical restraint status: Secondary | ICD-10-CM | POA: Diagnosis not present

## 2019-04-05 DIAGNOSIS — J069 Acute upper respiratory infection, unspecified: Secondary | ICD-10-CM | POA: Diagnosis not present

## 2019-04-05 DIAGNOSIS — Z7982 Long term (current) use of aspirin: Secondary | ICD-10-CM | POA: Diagnosis not present

## 2019-04-05 DIAGNOSIS — M1990 Unspecified osteoarthritis, unspecified site: Secondary | ICD-10-CM | POA: Diagnosis present

## 2019-04-05 DIAGNOSIS — Z8349 Family history of other endocrine, nutritional and metabolic diseases: Secondary | ICD-10-CM | POA: Diagnosis not present

## 2019-04-05 DIAGNOSIS — Z791 Long term (current) use of non-steroidal anti-inflammatories (NSAID): Secondary | ICD-10-CM | POA: Diagnosis not present

## 2019-04-05 DIAGNOSIS — U071 COVID-19: Secondary | ICD-10-CM | POA: Diagnosis present

## 2019-04-05 DIAGNOSIS — E1122 Type 2 diabetes mellitus with diabetic chronic kidney disease: Secondary | ICD-10-CM | POA: Diagnosis present

## 2019-04-05 DIAGNOSIS — N182 Chronic kidney disease, stage 2 (mild): Secondary | ICD-10-CM | POA: Diagnosis present

## 2019-04-05 DIAGNOSIS — E785 Hyperlipidemia, unspecified: Secondary | ICD-10-CM | POA: Diagnosis present

## 2019-04-05 DIAGNOSIS — Z801 Family history of malignant neoplasm of trachea, bronchus and lung: Secondary | ICD-10-CM | POA: Diagnosis not present

## 2019-04-05 DIAGNOSIS — E11319 Type 2 diabetes mellitus with unspecified diabetic retinopathy without macular edema: Secondary | ICD-10-CM | POA: Diagnosis present

## 2019-04-05 DIAGNOSIS — E1110 Type 2 diabetes mellitus with ketoacidosis without coma: Secondary | ICD-10-CM | POA: Diagnosis present

## 2019-04-05 DIAGNOSIS — I129 Hypertensive chronic kidney disease with stage 1 through stage 4 chronic kidney disease, or unspecified chronic kidney disease: Secondary | ICD-10-CM | POA: Diagnosis present

## 2019-04-05 DIAGNOSIS — F419 Anxiety disorder, unspecified: Secondary | ICD-10-CM | POA: Diagnosis present

## 2019-04-05 DIAGNOSIS — K219 Gastro-esophageal reflux disease without esophagitis: Secondary | ICD-10-CM | POA: Diagnosis present

## 2019-04-05 DIAGNOSIS — E876 Hypokalemia: Secondary | ICD-10-CM | POA: Diagnosis present

## 2019-04-05 DIAGNOSIS — Z87891 Personal history of nicotine dependence: Secondary | ICD-10-CM | POA: Diagnosis not present

## 2019-04-05 DIAGNOSIS — N179 Acute kidney failure, unspecified: Secondary | ICD-10-CM | POA: Diagnosis present

## 2019-04-05 LAB — BASIC METABOLIC PANEL
Anion gap: 14 (ref 5–15)
Anion gap: 13 (ref 5–15)
Anion gap: 14 (ref 5–15)
Anion gap: 12 (ref 5–15)
Anion gap: 19 — ABNORMAL HIGH (ref 5–15)
BUN: 34 mg/dL — ABNORMAL HIGH (ref 6–20)
BUN: 30 mg/dL — ABNORMAL HIGH (ref 6–20)
BUN: 30 mg/dL — ABNORMAL HIGH (ref 6–20)
BUN: 28 mg/dL — ABNORMAL HIGH (ref 6–20)
BUN: 28 mg/dL — ABNORMAL HIGH (ref 6–20)
CO2: 18 mmol/L — ABNORMAL LOW (ref 22–32)
CO2: 19 mmol/L — ABNORMAL LOW (ref 22–32)
CO2: 15 mmol/L — ABNORMAL LOW (ref 22–32)
CO2: 19 mmol/L — ABNORMAL LOW (ref 22–32)
CO2: 12 mmol/L — ABNORMAL LOW (ref 22–32)
Calcium: 8.2 mg/dL — ABNORMAL LOW (ref 8.9–10.3)
Calcium: 8.2 mg/dL — ABNORMAL LOW (ref 8.9–10.3)
Calcium: 7.8 mg/dL — ABNORMAL LOW (ref 8.9–10.3)
Calcium: 8.3 mg/dL — ABNORMAL LOW (ref 8.9–10.3)
Calcium: 8.2 mg/dL — ABNORMAL LOW (ref 8.9–10.3)
Chloride: 105 mmol/L (ref 98–111)
Chloride: 103 mmol/L (ref 98–111)
Chloride: 104 mmol/L (ref 98–111)
Chloride: 106 mmol/L (ref 98–111)
Chloride: 103 mmol/L (ref 98–111)
Creatinine, Ser: 1.57 mg/dL — ABNORMAL HIGH (ref 0.61–1.24)
Creatinine, Ser: 1.42 mg/dL — ABNORMAL HIGH (ref 0.61–1.24)
Creatinine, Ser: 1.45 mg/dL — ABNORMAL HIGH (ref 0.61–1.24)
Creatinine, Ser: 1.45 mg/dL — ABNORMAL HIGH (ref 0.61–1.24)
Creatinine, Ser: 1.6 mg/dL — ABNORMAL HIGH (ref 0.61–1.24)
GFR calc Af Amer: 56 mL/min — ABNORMAL LOW (ref >60)
GFR calc Af Amer: >60 mL/min (ref >60)
GFR calc Af Amer: >60 mL/min (ref >60)
GFR calc Af Amer: >60 mL/min (ref >60)
GFR calc Af Amer: 55 mL/min — ABNORMAL LOW (ref >60)
GFR calc non Af Amer: 48 mL/min — ABNORMAL LOW (ref >60)
GFR calc non Af Amer: 54 mL/min — ABNORMAL LOW (ref >60)
GFR calc non Af Amer: 53 mL/min — ABNORMAL LOW (ref >60)
GFR calc non Af Amer: 53 mL/min — ABNORMAL LOW (ref >60)
GFR calc non Af Amer: 47 mL/min — ABNORMAL LOW (ref >60)
Glucose, Bld: 163 mg/dL — ABNORMAL HIGH (ref 70–99)
Glucose, Bld: 249 mg/dL — ABNORMAL HIGH (ref 70–99)
Glucose, Bld: 327 mg/dL — ABNORMAL HIGH (ref 70–99)
Glucose, Bld: 147 mg/dL — ABNORMAL HIGH (ref 70–99)
Glucose, Bld: 312 mg/dL — ABNORMAL HIGH (ref 70–99)
Potassium: 2.9 mmol/L — ABNORMAL LOW (ref 3.5–5.1)
Potassium: 3 mmol/L — ABNORMAL LOW (ref 3.5–5.1)
Potassium: 3.3 mmol/L — ABNORMAL LOW (ref 3.5–5.1)
Potassium: 3 mmol/L — ABNORMAL LOW (ref 3.5–5.1)
Potassium: 3.1 mmol/L — ABNORMAL LOW (ref 3.5–5.1)
Sodium: 137 mmol/L (ref 135–145)
Sodium: 135 mmol/L (ref 135–145)
Sodium: 133 mmol/L — ABNORMAL LOW (ref 135–145)
Sodium: 137 mmol/L (ref 135–145)
Sodium: 134 mmol/L — ABNORMAL LOW (ref 135–145)

## 2019-04-05 LAB — BLOOD GAS, VENOUS
Acid-base deficit: 3.8 mmol/L — ABNORMAL HIGH (ref 0.0–2.0)
Bicarbonate: 21 mmol/L (ref 20.0–28.0)
Drawn by: 442
FIO2: 21
O2 Saturation: 74.4 %
Patient temperature: 38.5
pCO2, Ven: 36.1 mmHg — ABNORMAL LOW (ref 44.0–60.0)
pH, Ven: 7.374 (ref 7.250–7.430)
pO2, Ven: 43.1 mmHg (ref 32.0–45.0)

## 2019-04-05 LAB — URINALYSIS, ROUTINE W REFLEX MICROSCOPIC
Bacteria, UA: NONE SEEN
Bilirubin Urine: NEGATIVE
Glucose, UA: >=500 mg/dL — AB
Ketones, ur: 20 mg/dL — AB
Leukocytes,Ua: NEGATIVE
Nitrite: NEGATIVE
Protein, ur: 30 mg/dL — AB
Specific Gravity, Urine: 1.021 (ref 1.005–1.030)
pH: 5 (ref 5.0–8.0)

## 2019-04-05 LAB — VITAMIN B12: Vitamin B-12: 1012 pg/mL — ABNORMAL HIGH (ref 180–914)

## 2019-04-05 LAB — CBG MONITORING, ED
Glucose-Capillary: 113 mg/dL — ABNORMAL HIGH (ref 70–99)
Glucose-Capillary: 122 mg/dL — ABNORMAL HIGH (ref 70–99)
Glucose-Capillary: 123 mg/dL — ABNORMAL HIGH (ref 70–99)
Glucose-Capillary: 132 mg/dL — ABNORMAL HIGH (ref 70–99)
Glucose-Capillary: 153 mg/dL — ABNORMAL HIGH (ref 70–99)
Glucose-Capillary: 154 mg/dL — ABNORMAL HIGH (ref 70–99)
Glucose-Capillary: 154 mg/dL — ABNORMAL HIGH (ref 70–99)
Glucose-Capillary: 168 mg/dL — ABNORMAL HIGH (ref 70–99)
Glucose-Capillary: 178 mg/dL — ABNORMAL HIGH (ref 70–99)
Glucose-Capillary: 187 mg/dL — ABNORMAL HIGH (ref 70–99)
Glucose-Capillary: 261 mg/dL — ABNORMAL HIGH (ref 70–99)
Glucose-Capillary: 283 mg/dL — ABNORMAL HIGH (ref 70–99)
Glucose-Capillary: 303 mg/dL — ABNORMAL HIGH (ref 70–99)
Glucose-Capillary: 322 mg/dL — ABNORMAL HIGH (ref 70–99)
Glucose-Capillary: 329 mg/dL — ABNORMAL HIGH (ref 70–99)
Glucose-Capillary: 354 mg/dL — ABNORMAL HIGH (ref 70–99)
Glucose-Capillary: 354 mg/dL — ABNORMAL HIGH (ref 70–99)
Glucose-Capillary: 43 mg/dL — CL (ref 70–99)
Glucose-Capillary: 86 mg/dL (ref 70–99)

## 2019-04-05 LAB — BETA-HYDROXYBUTYRIC ACID
Beta-Hydroxybutyric Acid: 0.67 mmol/L — ABNORMAL HIGH (ref 0.05–0.27)
Beta-Hydroxybutyric Acid: 2.03 mmol/L — ABNORMAL HIGH (ref 0.05–0.27)
Beta-Hydroxybutyric Acid: 4.42 mmol/L — ABNORMAL HIGH (ref 0.05–0.27)

## 2019-04-05 LAB — HIV ANTIBODY (ROUTINE TESTING W REFLEX): HIV Screen 4th Generation wRfx: NONREACTIVE

## 2019-04-05 LAB — PROCALCITONIN: Procalcitonin: 1.11 ng/mL

## 2019-04-05 LAB — C-REACTIVE PROTEIN: CRP: 3 mg/dL — ABNORMAL HIGH (ref <1.0)

## 2019-04-05 LAB — OSMOLALITY: Osmolality: 307 mOsm/kg — ABNORMAL HIGH (ref 275–295)

## 2019-04-05 LAB — TSH: TSH: 2.144 u[IU]/mL (ref 0.350–4.500)

## 2019-04-05 LAB — RPR: RPR Ser Ql: NONREACTIVE

## 2019-04-05 MED ORDER — ENOXAPARIN SODIUM 40 MG/0.4ML ~~LOC~~ SOLN
40.0000 mg | SUBCUTANEOUS | Status: DC
  Administered 2019-04-05 – 2019-04-06 (×2): 40 mg via SUBCUTANEOUS
  Filled 2019-04-05 (×2): qty 0.4

## 2019-04-05 MED ORDER — ACETAMINOPHEN 325 MG PO TABS
650.0000 mg | ORAL_TABLET | Freq: Once | ORAL | Status: AC
  Administered 2019-04-05: 650 mg via ORAL

## 2019-04-05 MED ORDER — LOSARTAN POTASSIUM 25 MG PO TABS: 100.0000 mg | ORAL_TABLET | Freq: Every day | ORAL | Status: DC

## 2019-04-05 MED ORDER — DEXTROSE 50 % IV SOLN: 0.0000 mL | INTRAVENOUS | Status: DC | PRN

## 2019-04-05 MED ORDER — LORAZEPAM 2 MG/ML IJ SOLN
2.0000 mg | Freq: Once | INTRAMUSCULAR | Status: AC
  Administered 2019-04-05: 2 mg via INTRAVENOUS
  Filled 2019-04-05: qty 1

## 2019-04-05 MED ORDER — ZIPRASIDONE MESYLATE 20 MG IM SOLR
20.0000 mg | Freq: Once | INTRAMUSCULAR | Status: AC
  Administered 2019-04-05: 20 mg via INTRAMUSCULAR
  Filled 2019-04-05: qty 20

## 2019-04-05 MED ORDER — LOSARTAN POTASSIUM 25 MG PO TABS
100.0000 mg | ORAL_TABLET | Freq: Every day | ORAL | Status: DC
  Administered 2019-04-05: 100 mg via ORAL
  Filled 2019-04-05: qty 4

## 2019-04-05 MED ORDER — HALOPERIDOL LACTATE 5 MG/ML IJ SOLN
5.0000 mg | Freq: Once | INTRAMUSCULAR | Status: AC
  Administered 2019-04-05: 5 mg via INTRAMUSCULAR
  Filled 2019-04-05: qty 1

## 2019-04-05 MED ORDER — HALOPERIDOL LACTATE 5 MG/ML IJ SOLN
5.0000 mg | Freq: Once | INTRAMUSCULAR | Status: AC
  Administered 2019-04-05: 5 mg via INTRAVENOUS
  Filled 2019-04-05: qty 1

## 2019-04-05 MED ORDER — DEXTROSE 50 % IV SOLN
INTRAVENOUS | Status: AC
  Administered 2019-04-05: 50 mL
  Filled 2019-04-05: qty 50

## 2019-04-05 MED ORDER — LORAZEPAM 2 MG/ML IJ SOLN
1.0000 mg | Freq: Once | INTRAMUSCULAR | Status: AC
  Administered 2019-04-05: 1 mg via INTRAMUSCULAR
  Filled 2019-04-05: qty 1

## 2019-04-05 MED ORDER — POTASSIUM CHLORIDE 10 MEQ/100ML IV SOLN
10.0000 meq | INTRAVENOUS | Status: AC
  Administered 2019-04-05 (×3): 10 meq via INTRAVENOUS
  Filled 2019-04-05 (×3): qty 100

## 2019-04-05 MED ORDER — HALOPERIDOL 5 MG PO TABS: 5.0000 mg | ORAL_TABLET | Freq: Once | ORAL | Status: AC

## 2019-04-05 MED ORDER — METOPROLOL SUCCINATE ER 25 MG PO TB24
50.0000 mg | ORAL_TABLET | Freq: Every day | ORAL | Status: DC
  Administered 2019-04-05: 50 mg via ORAL
  Filled 2019-04-05: qty 2

## 2019-04-05 MED ORDER — SODIUM CHLORIDE 0.9 % IV SOLN: INTRAVENOUS | Status: DC

## 2019-04-05 MED ORDER — POTASSIUM CHLORIDE 10 MEQ/100ML IV SOLN
10.0000 meq | INTRAVENOUS | Status: AC
  Administered 2019-04-05 (×2): 10 meq via INTRAVENOUS
  Filled 2019-04-05: qty 100

## 2019-04-05 MED ORDER — INSULIN REGULAR(HUMAN) IN NACL 100-0.9 UT/100ML-% IV SOLN
INTRAVENOUS | Status: AC
  Administered 2019-04-05: 1.8 [IU]/h via INTRAVENOUS
  Administered 2019-04-05: 10 [IU]/h via INTRAVENOUS
  Administered 2019-04-05: 7 [IU]/h via INTRAVENOUS
  Administered 2019-04-06: 3.4 [IU]/h via INTRAVENOUS
  Administered 2019-04-08: 4.8 [IU]/h via INTRAVENOUS
  Filled 2019-04-05 (×3): qty 100

## 2019-04-05 MED ORDER — POTASSIUM CHLORIDE 10 MEQ/100ML IV SOLN
10.0000 meq | INTRAVENOUS | Status: AC
  Administered 2019-04-05: 10 meq via INTRAVENOUS
  Filled 2019-04-05 (×2): qty 100

## 2019-04-05 MED ORDER — METOPROLOL SUCCINATE ER 25 MG PO TB24: 50.0000 mg | ORAL_TABLET | Freq: Every day | ORAL | Status: DC

## 2019-04-05 MED ORDER — DEXTROSE-NACL 5-0.45 % IV SOLN: INTRAVENOUS | Status: DC

## 2019-04-05 NOTE — H&P (Signed)
TRH H&P    Patient Demographics:    Terry [name], is a 59 y.o. male  MRN: [id number]  DOB - 02/26/1961  Admit Date - 04/04/2019  Referring MD/NP/PA: Kem Parkinson  Outpatient Primary MD for the patient is Kathyrn Drown, MD  Patient coming from: Home  Chief complaint-shortness of breath   HPI:    Chadric Kimberley  is a 59 y.o. male, with history of diabetes mellitus type 2, hypertension, hyperlipidemia who initially presented to ED with complaints of shortness of breath, generalized weakness, body aches and fatigue.  Symptoms have been present since 1 week and have been gradually worsening.  Patient's wife and son are also sick with similar symptoms and red recent tested positive for COVID-19. SARS COVID-19 antigen test was positive in the ED.  Initially patient was alert, oriented x3 and later became combative, pulling at IV site.  Ativan was ordered, later required Geodon.  Patient was found to be in DKA and started on IV insulin.  As his mental status did not improve she was also started on Precedex infusion. Throughout this time patient's O2 sats were above 95% on room air. Chest x-ray showed no acute disease. UA was clear, UDS showed positive benzodiazepines.   Review of systems:    In addition to the HPI above,  No other review of systems obtainable due to patient's altered mental status.    Past History of the following :    Past Medical History:  Diagnosis Date  . Diabetes mellitus without complication ([address]) 8828  . Hyperlipidemia   . Hypertension   . Osteoarthritis       Past Surgical History:  Procedure Laterality Date  . ANKLE SURGERY Left 2007   2 plates/screws  . COLONOSCOPY N/A 08/04/2017   Procedure: COLONOSCOPY;  Surgeon: Rogene Houston, MD;  [facility];  Service: Endoscopy;  Laterality: N/A;  . ESOPHAGEAL DILATION N/A 08/04/2017   Procedure: ESOPHAGEAL DILATION;   Surgeon: Rogene Houston, MD;  [facility];  Service: Endoscopy;  Laterality: N/A;  . ESOPHAGOGASTRODUODENOSCOPY N/A 08/04/2017   Procedure: ESOPHAGOGASTRODUODENOSCOPY (EGD);  Surgeon: Rogene Houston, MD;  [facility];  Service: Endoscopy;  Laterality: N/A;  . POLYPECTOMY  08/04/2017   Procedure: POLYPECTOMY;  Surgeon: Rogene Houston, MD;  [facility];  Service: Endoscopy;;      Social History:      Social History   Tobacco Use  . Smoking status: Former Smoker    Quit date: 06/15/1992    Years since quitting: 26.8  . Smokeless tobacco: Never Used  Substance Use Topics  . Alcohol use: Never       Family History :     Family History  Problem Relation Age of Onset  . Hypertension Mother   . Diabetes Mother   . Heart disease Mother   . Hyperlipidemia Mother   . Cancer Father        lung      Home Medications:   Prior to Admission medications  Medication Sig Start Date End Date Taking? Authorizing Provider  amLODipine (NORVASC) 10 MG tablet TAKE ONE TABLET BY MOUTH ONCE DAILY. 11/06/18  Yes Kathyrn Drown, MD  aspirin EC 81 MG tablet Take 1 tablet (81 mg total) by mouth at bedtime. 08/07/17  Yes Rehman, Mechele Dawley, MD  BD PEN NEEDLE NANO U/F 32G X 4 MM MISC INJECT 4 TIMES DAILY AS DIRECTED. 12/17/18  Yes Luking, Elayne Snare, MD  CONTOUR NEXT TEST test strip TEST BLOOD SUGAR 4 TIMES DAILY. 12/20/17  Yes Luking, Elayne Snare, MD  indapamide (LOZOL) 2.5 MG tablet TAKE ONE TABLET BY MOUTH ONCE DAILY. 03/19/19  Yes Luking, Elayne Snare, MD  insulin aspart (NOVOLOG) 100 UNIT/ML injection Inject 6 units twice daily. May titrate up to 14 units 01/26/18  Yes Luking, Elayne Snare, MD  LANTUS SOLOSTAR 100 UNIT/ML Solostar Pen INJECT 90 UNITS TWICE DAILY. TITRATE TO 200 UNITS A DAY. Patient taking differently: Inject 90 Units into the skin daily. INJECT 90 UNITS TWICE DAILY. TITRATE TO 200 UNITS A DAY. 04/02/19  Yes Luking, Elayne Snare, MD  liraglutide (VICTOZA) 18 MG/3ML  SOPN INJECT 1.8MG SUBCUTANEOUSLY ONCE DAILY. 07/30/18  Yes Luking, Elayne Snare, MD  losartan (COZAAR) 100 MG tablet TAKE ONE TABLET BY MOUTH ONCE DAILY. 03/19/19  Yes Luking, Elayne Snare, MD  metoprolol succinate (TOPROL-XL) 50 MG 24 hr tablet TAKE ONE TABLET BY MOUTH ONCE DAILY. 02/04/19  Yes Luking, Scott A, MD  mometasone (ELOCON) 0.1 % cream Apply 1 application topically 2 (two) times daily as needed. 05/01/17  Yes Kathyrn Drown, MD  Multiple Vitamin (MULTIVITAMIN) tablet Take 1 tablet by mouth daily.   Yes [provider]  oxyCODONE-acetaminophen (PERCOCET) 10-325 MG tablet 1 tablet 4 times daily as needed pain Patient taking differently: Take 1 tablet by mouth every 4 (four) hours as needed for pain. 1 tablet 4 times daily as needed pain 01/23/19  Yes Luking, Scott A, MD  pantoprazole (PROTONIX) 40 MG tablet TAKE 1 TABLET BY MOUTH TWICE DAILY, BEFORE A MEAL. Patient taking differently: Take 40 mg by mouth 2 (two) times daily. TAKE 1 TABLET BY MOUTH TWICE DAILY BEFORE A MEAL. 01/18/19  Yes Luking, Elayne Snare, MD  rosuvastatin (CRESTOR) 40 MG tablet TAKE ONE TABLET BY MOUTH ONCE DAILY. 01/18/19  Yes Kathyrn Drown, MD  sertraline (ZOLOFT) 50 MG tablet TAKE (1) TABLET BY MOUTH ONCE DAILY. Patient taking differently: Take 50 mg by mouth daily. TAKE (1) TABLET BY MOUTH ONCE DAILY. 01/24/19  Yes Luking, Elayne Snare, MD  sildenafil (REVATIO) 20 MG tablet Take 1-5 tablets before sexual activity as directed. No greater that 5 tablets in a day. 01/06/17  Yes Mikey Kirschner, MD  diclofenac (VOLTAREN) 75 MG EC tablet Take one tablet two times daily 10/30/17   Kathyrn Drown, MD     Allergies:     Allergies  Allergen Reactions  . Altace [Ramipril] Cough  . Invokana [Canagliflozin] Other (See Comments)    laryngitis  . Metformin And Related Diarrhea  . Novolog [Insulin Aspart]     intolerance     Physical Exam:   Vitals  Blood pressure 138/61, pulse (!) 105, temperature (!) 103.1 F (39.5 C),  resp. rate (!) 31, height '5\' 9"'  (1.753 m), weight 86.2 kg, SpO2 97 %.  1.  General: Patient is combative, currently in restraints  2. Psychiatric: Combative, in restraints, not answering questions, making incomprehensible sounds.  3. Neurologic: Alert, agitated, moving all extremities, not following commands  4. HEENMT:  Atraumatic normocephalic  5. Respiratory : Clear to auscultation bilaterally  6. Cardiovascular : S1-S2, regular, no murmur auscultated  7. Gastrointestinal:  Abdomen is soft, nontender, no organomegaly      Data Review:    CBC Recent Labs  Lab 04/04/19 1728  WBC 7.0  HGB 16.9  HCT 46.7  PLT 147*  MCV 84.8  MCH 30.7  MCHC 36.2*  RDW 11.8   ------------------------------------------------------------------------------------------------------------------  Results for orders placed or performed during the hospital encounter of 04/04/19 (from the past 48 hour(s))  CBG monitoring, ED     Status: Abnormal   Collection Time: 04/04/19  4:07 PM  Result Value Ref Range   Glucose-Capillary 538 (HH) 70 - 99 mg/dL  CBG monitoring, ED     Status: Abnormal   Collection Time: 04/04/19  5:01 PM  Result Value Ref Range   Glucose-Capillary 526 (HH) 70 - 99 mg/dL   Comment 1 Notify RN    Comment 2 Document in Chart   Basic metabolic panel     Status: Abnormal   Collection Time: 04/04/19  5:28 PM  Result Value Ref Range   Sodium 129 (L) 135 - 145 mmol/L   Potassium 3.5 3.5 - 5.1 mmol/L   Chloride 88 (L) 98 - 111 mmol/L   CO2 20 (L) 22 - 32 mmol/L   Glucose, Bld 498 (H) 70 - 99 mg/dL   BUN 41 (H) 6 - 20 mg/dL   Creatinine, Ser 1.82 (H) 0.61 - 1.24 mg/dL   Calcium 9.7 8.9 - 10.3 mg/dL   GFR calc non Af Amer 40 (L) >60 mL/min   GFR calc Af Amer 47 (L) >60 mL/min   Anion gap >20 (H) 5 - 15    Comment: Performed at [facility], [address], [place], [place] [address]  CBC     Status: Abnormal   Collection Time: 04/04/19  5:28 PM  Result Value Ref Range    WBC 7.0 4.0 - 10.5 K/uL   RBC 5.51 4.22 - 5.81 MIL/uL   Hemoglobin 16.9 13.0 - 17.0 g/dL   HCT 46.7 39.0 - 52.0 %   MCV 84.8 80.0 - 100.0 fL   MCH 30.7 26.0 - 34.0 pg   MCHC 36.2 (H) 30.0 - 36.0 g/dL   RDW 11.8 11.5 - 15.5 %   Platelets 147 (L) 150 - 400 K/uL   nRBC 0.0 0.0 - 0.2 %    Comment: Performed at [facility], [address] [place], [place] [address]  D-dimer, quantitative (not at [facility])     Status: None   Collection Time: 04/04/19  5:28 PM  Result Value Ref Range   D-Dimer, Quant 0.31 0.00 - 0.50 ug/mL-FEU    Comment: (NOTE) At the manufacturer cut-off of 0.50 ug/mL FEU, this assay has been documented to exclude PE with a sensitivity and negative predictive value of 97 to 99%.  At this time, this assay has not been approved by the FDA to exclude DVT/VTE. Results should be correlated with clinical presentation. Performed at [facility], [address] [place], [place] [address]   Troponin I (High Sensitivity)     Status: None   Collection Time: 04/04/19  5:28 PM  Result Value Ref Range   Troponin I (High Sensitivity) 4 <18 ng/L    Comment: (NOTE) Elevated high sensitivity troponin I (hsTnI) values and significant  changes across serial measurements may suggest ACS but many other  chronic and acute conditions are known to elevate  hsTnI results.  Refer to the "Links" section for chest pain algorithms and additional  guidance. Performed at [facility], [address] [place], [place] [address]   Lactic acid, plasma     Status: Abnormal   Collection Time: 04/04/19  5:28 PM  Result Value Ref Range   Lactic Acid, Venous 2.5 (HH) 0.5 - 1.9 mmol/L    Comment: CRITICAL RESULT CALLED TO, READ BACK BY AND VERIFIED WITH: NORMAN,B ON 04/04/19 AT 2015 BY LOY,C Performed at [facility] [address], [place], [place] [address]   Blood culture (routine x 2)     Status: None (Preliminary result)   Collection Time: 04/04/19  5:28 PM   Specimen: Vein; Blood  Result Value Ref  Range   Specimen Description BLOOD BOTTLES DRAWN AEROBIC ONLY    Special Requests      RIGHT ANTECUBITAL Performed at [facility], [address] [place], [place] [address]    Culture PENDING    Report Status PENDING   Ethanol     Status: None   Collection Time: 04/04/19  5:28 PM  Result Value Ref Range   Alcohol, Ethyl (B) <10 <10 mg/dL    Comment: (NOTE) Lowest detectable limit for serum alcohol is 10 mg/dL. For medical purposes only. Performed at [facility], [address], [place], [place] [address]   POC SARS Coronavirus 2 Ag-ED - Nasal Swab (BD Veritor Kit)     Status: Abnormal   Collection Time: 04/04/19  6:55 PM  Result Value Ref Range   SARS Coronavirus 2 Ag POSITIVE (A) NEGATIVE    Comment: (NOTE) SARS-CoV-2 antigen PRESENT. Positive results indicate the presence of viral antigens, but clinical correlation with patient history and other diagnostic information is necessary to determine patient infection status.  Positive results do not rule out bacterial infection or co-infection  with other viruses. False positive results are rare but can occur, and confirmatory RT-PCR testing may be appropriate in some circumstances. The expected result is Negative. Fact Sheet for Patients: PodPark.tn Fact Sheet for Providers: GiftContent.is  This test is not yet approved or cleared by the [place] FDA and  has been authorized for detection and/or diagnosis of SARS-CoV-2 by FDA under an Emergency Use Authorization (EUA).  This EUA will remain in effect (meaning this test can be used) for the duration of  the COVID-19 declaration under Section 564(b)(1) of the Act, 21 U.S.C. section 360bbb-3(b)(1), unless the a uthorization is terminated or revoked sooner.   Troponin I (High Sensitivity)     Status: None   Collection Time: 04/04/19  8:26 PM  Result Value Ref Range   Troponin I (High Sensitivity) 8 <18 ng/L     Comment: (NOTE) Elevated high sensitivity troponin I (hsTnI) values and significant  changes across serial measurements may suggest ACS but many other  chronic and acute conditions are known to elevate hsTnI results.  Refer to the "Links" section for chest pain algorithms and additional  guidance. Performed at [facility], [address] [place], [place] [address]   Lactic acid, plasma     Status: Abnormal   Collection Time: 04/04/19  8:26 PM  Result Value Ref Range   Lactic Acid, Venous 8.9 (HH) 0.5 - 1.9 mmol/L    Comment: CRITICAL RESULT CALLED TO, READ BACK BY AND VERIFIED WITH: NORMAN, B AT 2217 ON 1.7.21 BY Crehan,B Performed at [facility], [address] [place], [place] [address]   Blood culture (routine x 2)  Status: None (Preliminary result)   Collection Time: 04/04/19  8:26 PM   Specimen: BLOOD LEFT HAND  Result Value Ref Range   Specimen Description BLOOD LEFT HAND    Special Requests      BOTTLES DRAWN AEROBIC AND ANAEROBIC Blood Culture adequate volume Performed at [facility], [address], [place], [place] [address]    Culture PENDING    Report Status PENDING   Basic metabolic panel     Status: Abnormal   Collection Time: 04/04/19  8:26 PM  Result Value Ref Range   Sodium 131 (L) 135 - 145 mmol/L   Potassium 2.9 (L) 3.5 - 5.1 mmol/L   Chloride 96 (L) 98 - 111 mmol/L   CO2 13 (L) 22 - 32 mmol/L   Glucose, Bld 487 (H) 70 - 99 mg/dL   BUN 40 (H) 6 - 20 mg/dL   Creatinine, Ser 1.88 (H) 0.61 - 1.24 mg/dL   Calcium 8.2 (L) 8.9 - 10.3 mg/dL   GFR calc non Af Amer 39 (L) >60 mL/min   GFR calc Af Amer 45 (L) >60 mL/min   Anion gap 22 (H) 5 - 15    Comment: Performed at [facility], [address], [place], [place] [address]  APTT     Status: None   Collection Time: 04/04/19  8:26 PM  Result Value Ref Range   aPTT 28 24 - 36 seconds    Comment: Performed at [facility], [address], [place], [place] [address]  Protime-INR     Status: None   Collection  Time: 04/04/19  8:26 PM  Result Value Ref Range   Prothrombin Time 11.6 11.4 - 15.2 seconds   INR 0.9 0.8 - 1.2    Comment: (NOTE) INR goal varies based on device and disease states. Performed at [facility], [address] [place], [place] [address]   CBG monitoring, ED     Status: Abnormal   Collection Time: 04/04/19  8:41 PM  Result Value Ref Range   Glucose-Capillary 454 (H) 70 - 99 mg/dL  CBG monitoring, ED     Status: Abnormal   Collection Time: 04/04/19  9:29 PM  Result Value Ref Range   Glucose-Capillary 342 (H) 70 - 99 mg/dL  CBG monitoring, ED     Status: Abnormal   Collection Time: 04/04/19 10:06 PM  Result Value Ref Range   Glucose-Capillary 289 (H) 70 - 99 mg/dL  Urinalysis, Routine w reflex microscopic     Status: Abnormal   Collection Time: 04/04/19 11:28 PM  Result Value Ref Range   Color, Urine YELLOW YELLOW   APPearance CLEAR CLEAR   Specific Gravity, Urine 1.021 1.005 - 1.030   pH 5.0 5.0 - 8.0   Glucose, UA >=500 (A) NEGATIVE mg/dL   Hgb urine dipstick LARGE (A) NEGATIVE   Bilirubin Urine NEGATIVE NEGATIVE   Ketones, ur 20 (A) NEGATIVE mg/dL   Protein, ur 30 (A) NEGATIVE mg/dL   Nitrite NEGATIVE NEGATIVE   Leukocytes,Ua NEGATIVE NEGATIVE   RBC / HPF 0-5 0 - 5 RBC/hpf   WBC, UA 0-5 0 - 5 WBC/hpf   Bacteria, UA NONE SEEN NONE SEEN   Mucus PRESENT     Comment: Performed at [facility], [address] [place], [place] [address]  Rapid urine drug screen (hospital performed)     Status: Abnormal   Collection Time: 04/04/19 11:28 PM  Result Value Ref Range   Opiates NONE DETECTED NONE DETECTED   Cocaine NONE DETECTED  NONE DETECTED   Benzodiazepines POSITIVE (A) NONE DETECTED   Amphetamines NONE DETECTED NONE DETECTED   Tetrahydrocannabinol NONE DETECTED NONE DETECTED   Barbiturates NONE DETECTED NONE DETECTED    Comment: (NOTE) DRUG SCREEN FOR MEDICAL PURPOSES ONLY.  IF CONFIRMATION IS NEEDED FOR ANY PURPOSE, NOTIFY LAB WITHIN 5 DAYS. LOWEST  DETECTABLE LIMITS FOR URINE DRUG SCREEN Drug Class                     Cutoff (ng/mL) Amphetamine and metabolites    1000 Barbiturate and metabolites    200 Benzodiazepine                 409 Tricyclics and metabolites     300 Opiates and metabolites        300 Cocaine and metabolites        300 THC                            50 Performed at [facility] [place], [place] [address]   CBG monitoring, ED     Status: Abnormal   Collection Time: 04/04/19 11:29 PM  Result Value Ref Range   Glucose-Capillary 199 (H) 70 - 99 mg/dL  CBG monitoring, ED     Status: Abnormal   Collection Time: 04/05/19 12:28 AM  Result Value Ref Range   Glucose-Capillary 113 (H) 70 - 99 mg/dL  CBG monitoring, ED     Status: Abnormal   Collection Time: 04/05/19  1:36 AM  Result Value Ref Range   Glucose-Capillary 153 (H) 70 - 99 mg/dL    Chemistries  Recent Labs  Lab 04/04/19 1728 04/04/19 2026  NA 129* 131*  K 3.5 2.9*  CL 88* 96*  CO2 20* 13*  GLUCOSE 498* 487*  BUN 41* 40*  CREATININE 1.82* 1.88*  CALCIUM 9.7 8.2*   ------------------------------------------------------------------------------------------------------------------  ------------------------------------------------------------------------------------------------------------------ GFR: Estimated Creatinine Clearance: 47.2 mL/min (A) (by C-G formula based on SCr of 1.88 mg/dL (H)). Liver Function Tests: No results for input(s): AST, ALT, ALKPHOS, BILITOT, PROT, ALBUMIN in the last 168 hours. No results for input(s): LIPASE, AMYLASE in the last 168 hours. No results for input(s): AMMONIA in the last 168 hours. Coagulation Profile: Recent Labs  Lab 04/04/19 2026  INR 0.9   Cardiac Enzymes: No results for input(s): CKTOTAL, CKMB, CKMBINDEX, TROPONINI in the last 168 hours. BNP (last 3 results) No results for input(s): PROBNP in the last 8760 hours. HbA1C: No results for input(s): HGBA1C in the last 72  hours. CBG: Recent Labs  Lab 04/04/19 2129 04/04/19 2206 04/04/19 2329 04/05/19 0028 04/05/19 0136  GLUCAP 342* 289* 199* 113* 153*   Lipid Profile: No results for input(s): CHOL, HDL, LDLCALC, TRIG, CHOLHDL, LDLDIRECT in the last 72 hours. Thyroid Function Tests: No results for input(s): TSH, T4TOTAL, FREET4, T3FREE, THYROIDAB in the last 72 hours. Anemia Panel: No results for input(s): VITAMINB12, FOLATE, FERRITIN, TIBC, IRON, RETICCTPCT in the last 72 hours.  --------------------------------------------------------------------------------------------------------------- Urine analysis:    Component Value Date/Time   COLORURINE YELLOW 04/04/2019 [place] 04/04/2019 2328   LABSPEC 1.021 04/04/2019 2328   PHURINE 5.0 04/04/2019 2328   GLUCOSEU >=500 (A) 04/04/2019 2328   HGBUR LARGE (A) 04/04/2019 2328   BILIRUBINUR NEGATIVE 04/04/2019 2328   KETONESUR 20 (A) 04/04/2019 2328   PROTEINUR 30 (A) 04/04/2019 2328   NITRITE NEGATIVE 04/04/2019 2328   LEUKOCYTESUR NEGATIVE 04/04/2019 2328  Imaging Results:    DG Chest Port 1 View  Result Date: 04/04/2019 CLINICAL DATA:  Weakness shortness of breath EXAM: PORTABLE CHEST 1 VIEW COMPARISON:  None. FINDINGS: Low lung volumes. No focal airspace disease or effusion. Normal heart size. No pneumothorax IMPRESSION: No active disease.  Low lung volume Electronically Signed   By: Donavan Foil M.D.   On: 04/04/2019 18:38    My personal review of EKG: Rhythm NSR, no ST changes   Assessment & Plan:    Active Problems:   Altered mental status   1. Altered mental status-unclear etiology, likely due to underlying COVID-19 infection.  Chest x-ray is clear, patient is not requiring oxygen.  Would not start remdesivir or Decadron at this time.  Patient started on vancomycin and cefepime empirically for?  Sepsis.  Lactic acid went from 2.5-8.9.  Once patient is more calm, will try to obtain CT head.  Continue him in  restraints at this time.  Alcohol level is less than 10 check TSH, B12, RPR.  2. COVID-19 infection-patient COVID-19 antigen test came back positive.  Chest x-ray is unremarkable.  Patient not requiring oxygen.  Would not initiate remdesivir or Decadron at this time.  3. Diabetic ketoacidosis-anion gap 22, CO2 went down from 20-13.  Continue IV insulin.  Check BMP every 4 hours, switch IV fluids to D5 half-normal saline once blood glucose less than 250.  4. Hypertension-blood pressure stable, will restart metoprolol, losartan    DVT Prophylaxis-   Lovenox  AM Labs Ordered, also please review Full Orders  Family Communication: No family at bedside  Code Status: Presumed full code  Admission status: Inpatient: Based on patients clinical presentation and evaluation of above clinical data, I have made determination that patient meets Inpatient criteria at this time.  Time spent in minutes : 60 minutes   Aadhya Bustamante S Gray Maugeri M.D

## 2019-04-05 NOTE — ED Notes (Signed)
Per hospitalist place pt back on insulin

## 2019-04-05 NOTE — ED Notes (Signed)
Cooling blanket applied.

## 2019-04-05 NOTE — Progress Notes (Signed)
Patient seen and examined. Admitted after midnight secondary to high grade fever, DKA and positive COVID-19. Patient no complaining of SOB currently, but tachypneic most likely in the setting of metabolic acidosis with respiratory compensation; CXR without acute cardiopulmonary process. Patient very agitated, comvative restless and confused; otherwise hemodynamically stable. Please refer to H&P written by Dr. Darrick Meigs for further info/details on admission.  Plan: -continue Endotool IV insulin protocol -continue IVF's -follow electrolytes and replete as needed -PRN tylenol for fever and pain -started on Vit C and Zinc -no meeting criteria for remdesivir and/or steroids currently. -follow RPR, TSH, CRP and procalcitonin -follow pending cultures and if not clear source of infection identified will discontinue abx's (patient SIRS/sepsis most likely from [place] and/or DKA status, which will not required antibiotic therapy).  Barton Dubois MD [phone]

## 2019-04-05 NOTE — ED Notes (Signed)
Verbal order by dr Darrick Meigs for 1mg  ativan for agitaion. order entered

## 2019-04-05 NOTE — ED Notes (Signed)
Pt has not responded to the ativan. Pt still very restless in bed

## 2019-04-05 NOTE — Progress Notes (Signed)
Inpatient Diabetes Program Recommendations  AACE/ADA: New Consensus Statement on Inpatient Glycemic Control   Target Ranges:  Prepandial:   less than 140 mg/dL      Peak postprandial:   less than 180 mg/dL (1-2 hours)      Critically ill patients:  140 - 180 mg/dL   Results for Terry [name], Terry [name]" (MRN [id number]) as of 04/05/2019 09:21  Ref. Range 04/05/2019 00:28 04/05/2019 01:36 04/05/2019 02:42 04/05/2019 03:47 04/05/2019 04:58 04/05/2019 06:14 04/05/2019 07:00 04/05/2019 08:24 04/05/2019 09:07  Glucose-Capillary Latest Ref Range: 70 - 99 mg/dL 113 (H) 153 (H) 154 (H) 122 (H) 187 (H) 354 (H) 329 (H) 132 (H) 86  Results for Terry [name], Terry [name]" (MRN [id number]) as of 04/05/2019 09:21  Ref. Range 04/04/2019 17:28  Glucose Latest Ref Range: 70 - 99 mg/dL 498 (H)  Results for Terry [name], Terry [name]" (MRN [id number]) as of 04/05/2019 09:21  Ref. Range 02/07/2019 16:06  Hemoglobin A1C Latest Ref Range: 4.8 - 5.6 % 8.4 (H)   Review of Glycemic Control  Diabetes history: DM2 Outpatient Diabetes medications: Lantus 90 units BID, Victoza 1.8 mg daily Current orders for Inpatient glycemic control: IV insulin  Inpatient Diabetes Program Recommendations:    Insulin at time of transition from IV to SQ: Once acidosis is cleared as determined by MD and MD ready to transition from IV to SQ insulin, please consider ordering Lantus 35 units Q24H (based on 86 kg x 0.3 units), CBGs Q4H, Novolog 0-15 units Q4H.   NOTE: Noted consult for Diabetes Coordinator. Chart reviewed. Patient admitted with COVID, DKA, and altered mental status. Patient is currently ordered IV insulin.   Thanks, Barnie Alderman, RN, MSN, CDE Diabetes Coordinator Inpatient Diabetes Program [phone] (Team Pager from 8am to 5pm)

## 2019-04-05 NOTE — ED Notes (Signed)
Pt continues to fight & yell @ staff. See MAR for medications given

## 2019-04-06 ENCOUNTER — Inpatient Hospital Stay (HOSPITAL_COMMUNITY): Payer: BC Managed Care – PPO

## 2019-04-06 DIAGNOSIS — G9341 Metabolic encephalopathy: Secondary | ICD-10-CM

## 2019-04-06 DIAGNOSIS — I10 Essential (primary) hypertension: Secondary | ICD-10-CM

## 2019-04-06 DIAGNOSIS — N179 Acute kidney failure, unspecified: Secondary | ICD-10-CM

## 2019-04-06 DIAGNOSIS — N182 Chronic kidney disease, stage 2 (mild): Secondary | ICD-10-CM

## 2019-04-06 LAB — BASIC METABOLIC PANEL
Anion gap: 16 — ABNORMAL HIGH (ref 5–15)
Anion gap: 14 (ref 5–15)
Anion gap: 14 (ref 5–15)
BUN: 25 mg/dL — ABNORMAL HIGH (ref 6–20)
BUN: 26 mg/dL — ABNORMAL HIGH (ref 6–20)
BUN: 26 mg/dL — ABNORMAL HIGH (ref 6–20)
CO2: 13 mmol/L — ABNORMAL LOW (ref 22–32)
CO2: 14 mmol/L — ABNORMAL LOW (ref 22–32)
CO2: 14 mmol/L — ABNORMAL LOW (ref 22–32)
Calcium: 8.7 mg/dL — ABNORMAL LOW (ref 8.9–10.3)
Calcium: 8.8 mg/dL — ABNORMAL LOW (ref 8.9–10.3)
Calcium: 8.7 mg/dL — ABNORMAL LOW (ref 8.9–10.3)
Chloride: 111 mmol/L (ref 98–111)
Chloride: 112 mmol/L — ABNORMAL HIGH (ref 98–111)
Chloride: 113 mmol/L — ABNORMAL HIGH (ref 98–111)
Creatinine, Ser: 1.44 mg/dL — ABNORMAL HIGH (ref 0.61–1.24)
Creatinine, Ser: 1.45 mg/dL — ABNORMAL HIGH (ref 0.61–1.24)
Creatinine, Ser: 1.46 mg/dL — ABNORMAL HIGH (ref 0.61–1.24)
GFR calc Af Amer: >60 mL/min (ref >60)
GFR calc Af Amer: >60 mL/min (ref >60)
GFR calc Af Amer: >60 mL/min (ref >60)
GFR calc non Af Amer: 54 mL/min — ABNORMAL LOW (ref >60)
GFR calc non Af Amer: 53 mL/min — ABNORMAL LOW (ref >60)
GFR calc non Af Amer: 53 mL/min — ABNORMAL LOW (ref >60)
Glucose, Bld: 164 mg/dL — ABNORMAL HIGH (ref 70–99)
Glucose, Bld: 171 mg/dL — ABNORMAL HIGH (ref 70–99)
Glucose, Bld: 193 mg/dL — ABNORMAL HIGH (ref 70–99)
Potassium: 3.3 mmol/L — ABNORMAL LOW (ref 3.5–5.1)
Potassium: 3.1 mmol/L — ABNORMAL LOW (ref 3.5–5.1)
Potassium: 3.4 mmol/L — ABNORMAL LOW (ref 3.5–5.1)
Sodium: 140 mmol/L (ref 135–145)
Sodium: 140 mmol/L (ref 135–145)
Sodium: 141 mmol/L (ref 135–145)

## 2019-04-06 LAB — URINE CULTURE: Culture: NO GROWTH

## 2019-04-06 LAB — LACTIC ACID, PLASMA
Lactic Acid, Venous: 1.8 mmol/L (ref 0.5–1.9)
Lactic Acid, Venous: 1.6 mmol/L (ref 0.5–1.9)

## 2019-04-06 LAB — CBG MONITORING, ED
Glucose-Capillary: 256 mg/dL — ABNORMAL HIGH (ref 70–99)
Glucose-Capillary: 202 mg/dL — ABNORMAL HIGH (ref 70–99)
Glucose-Capillary: 172 mg/dL — ABNORMAL HIGH (ref 70–99)
Glucose-Capillary: 184 mg/dL — ABNORMAL HIGH (ref 70–99)
Glucose-Capillary: 189 mg/dL — ABNORMAL HIGH (ref 70–99)
Glucose-Capillary: 164 mg/dL — ABNORMAL HIGH (ref 70–99)
Glucose-Capillary: 143 mg/dL — ABNORMAL HIGH (ref 70–99)
Glucose-Capillary: 117 mg/dL — ABNORMAL HIGH (ref 70–99)
Glucose-Capillary: 137 mg/dL — ABNORMAL HIGH (ref 70–99)
Glucose-Capillary: 172 mg/dL — ABNORMAL HIGH (ref 70–99)
Glucose-Capillary: 191 mg/dL — ABNORMAL HIGH (ref 70–99)
Glucose-Capillary: 169 mg/dL — ABNORMAL HIGH (ref 70–99)
Glucose-Capillary: 162 mg/dL — ABNORMAL HIGH (ref 70–99)

## 2019-04-06 LAB — GLUCOSE, CAPILLARY
Glucose-Capillary: 177 mg/dL — ABNORMAL HIGH (ref 70–99)
Glucose-Capillary: 174 mg/dL — ABNORMAL HIGH (ref 70–99)
Glucose-Capillary: 167 mg/dL — ABNORMAL HIGH (ref 70–99)

## 2019-04-06 LAB — BETA-HYDROXYBUTYRIC ACID: Beta-Hydroxybutyric Acid: 3.75 mmol/L — ABNORMAL HIGH (ref 0.05–0.27)

## 2019-04-06 LAB — PROCALCITONIN: Procalcitonin: 1.26 ng/mL

## 2019-04-06 MED ORDER — LORAZEPAM 2 MG/ML IJ SOLN
1.0000 mg | INTRAMUSCULAR | Status: DC | PRN
  Administered 2019-04-07 (×4): 2 mg via INTRAVENOUS
  Filled 2019-04-06 (×4): qty 1

## 2019-04-06 MED ORDER — POTASSIUM CHLORIDE 10 MEQ/100ML IV SOLN
10.0000 meq | INTRAVENOUS | Status: AC
  Administered 2019-04-06 (×2): 10 meq via INTRAVENOUS
  Filled 2019-04-06 (×2): qty 100

## 2019-04-06 MED ORDER — PANTOPRAZOLE SODIUM 40 MG PO TBEC
40.0000 mg | DELAYED_RELEASE_TABLET | Freq: Every day | ORAL | Status: DC
  Filled 2019-04-06: qty 1

## 2019-04-06 MED ORDER — CHLORHEXIDINE GLUCONATE 0.12 % MT SOLN
15.0000 mL | Freq: Two times a day (BID) | OROMUCOSAL | Status: DC
  Administered 2019-04-07 – 2019-04-09 (×6): 15 mL via OROMUCOSAL

## 2019-04-06 MED ORDER — ZINC SULFATE 220 (50 ZN) MG PO CAPS
220.0000 mg | ORAL_CAPSULE | Freq: Every day | ORAL | Status: DC
  Administered 2019-04-08 – 2019-04-10 (×3): 220 mg via ORAL
  Filled 2019-04-06 (×4): qty 1

## 2019-04-06 MED ORDER — ASCORBIC ACID 500 MG PO TABS
500.0000 mg | ORAL_TABLET | Freq: Every day | ORAL | Status: DC
  Administered 2019-04-08 – 2019-04-10 (×3): 500 mg via ORAL
  Filled 2019-04-06 (×4): qty 1

## 2019-04-06 MED ORDER — LORAZEPAM 2 MG/ML IJ SOLN
2.0000 mg | Freq: Once | INTRAMUSCULAR | Status: AC
  Administered 2019-04-06: 2 mg via INTRAVENOUS
  Filled 2019-04-06: qty 1

## 2019-04-06 MED ORDER — CHLORHEXIDINE GLUCONATE CLOTH 2 % EX PADS
6.0000 | MEDICATED_PAD | Freq: Every day | CUTANEOUS | Status: DC
  Administered 2019-04-06 – 2019-04-10 (×5): 6 via TOPICAL

## 2019-04-06 MED ORDER — LABETALOL HCL 5 MG/ML IV SOLN
10.0000 mg | INTRAVENOUS | Status: DC | PRN
  Filled 2019-04-06: qty 4

## 2019-04-06 MED ORDER — METOPROLOL SUCCINATE ER 50 MG PO TB24: 50.0000 mg | ORAL_TABLET | Freq: Two times a day (BID) | ORAL | Status: DC

## 2019-04-06 MED ORDER — SERTRALINE HCL 50 MG PO TABS
50.0000 mg | ORAL_TABLET | Freq: Every day | ORAL | Status: DC
  Administered 2019-04-08 – 2019-04-10 (×3): 50 mg via ORAL
  Filled 2019-04-06 (×4): qty 1

## 2019-04-06 MED ORDER — LORAZEPAM 2 MG/ML IJ SOLN
1.0000 mg | Freq: Four times a day (QID) | INTRAMUSCULAR | Status: DC | PRN
  Administered 2019-04-06: 1 mg via INTRAMUSCULAR
  Filled 2019-04-06: qty 1

## 2019-04-06 MED ORDER — HALOPERIDOL LACTATE 5 MG/ML IJ SOLN
5.0000 mg | Freq: Once | INTRAMUSCULAR | Status: AC
  Administered 2019-04-06: 5 mg via INTRAMUSCULAR
  Filled 2019-04-06: qty 1

## 2019-04-06 MED ORDER — SODIUM CHLORIDE 0.9 % IV SOLN: Freq: Once | INTRAVENOUS | Status: AC

## 2019-04-06 MED ORDER — LABETALOL HCL 5 MG/ML IV SOLN
10.0000 mg | Freq: Once | INTRAVENOUS | Status: AC
  Administered 2019-04-06: 10 mg via INTRAVENOUS
  Filled 2019-04-06: qty 4

## 2019-04-06 MED ORDER — ORAL CARE MOUTH RINSE
15.0000 mL | Freq: Two times a day (BID) | OROMUCOSAL | Status: DC
  Administered 2019-04-07 – 2019-04-10 (×8): 15 mL via OROMUCOSAL

## 2019-04-06 MED ORDER — ACETAMINOPHEN 10 MG/ML IV SOLN
1000.0000 mg | Freq: Once | INTRAVENOUS | Status: AC
  Administered 2019-04-06: 1000 mg via INTRAVENOUS
  Filled 2019-04-06: qty 100

## 2019-04-06 MED ORDER — HYDRALAZINE HCL 20 MG/ML IJ SOLN
10.0000 mg | Freq: Three times a day (TID) | INTRAMUSCULAR | Status: DC | PRN
  Filled 2019-04-06: qty 1

## 2019-04-06 NOTE — Progress Notes (Signed)
PROGRESS NOTE    Terry [name]  [id number] DOB: December 27, 1960 DOA: 04/04/2019 PCP: Kathyrn Drown, MD     Brief Narrative:  59 y.o. male, with history of diabetes mellitus type 2, hypertension, hyperlipidemia who initially presented to ED with complaints of shortness of breath, generalized weakness, body aches and fatigue.  Symptoms have been present since 1 week and have been gradually worsening.  Patient's wife and son are also sick with similar symptoms and red recent tested positive for COVID-19. SARS COVID-19 antigen test was positive in the ED.  Initially patient was alert, oriented x3 and later became combative, pulling at IV site.  Ativan was ordered, later required Geodon.  Patient was found to be in DKA and started on IV insulin.  As his mental status did not improve she was also started on Precedex infusion. Throughout this time patient's O2 sats were above 95% on room air. Chest x-ray showed no acute disease. UA was clear, UDS showed positive benzodiazepines.  Assessment & Plan: 1-Altered mental status/acute metabolic encephalopathy -in the setting of COVID-19 infection and DKA -continue constant reorientation -sitter and PRN restrains for safety if needed -continue supportive care and IVF's  2-COVID-19 infection -with unremarkable CXR -not requiring O2 supplementation (O2 sat in high 90's on RA) -patient is tachypneic due to acidosis from DKA -continue zinc and vit C -no meeting criteria for steroids or remdesivir currently -continue PRN antipyretics   3-DKA -anion gap 16 -Bicarb 13 -CBG's 160-190 currently -continue IV insulin endotool -follow electrolytes and renal function closely -continue IVF's -recent A1C 8.4 (Nov 2020)  4-GERD -continue PPI  5-HTN -Holding ARB and diuretics in the setting of acute kidney injury -continue IVF's -PRN hydralazine ordered -resume home metoprolol at adjusted dose.  6-anxiety/depression -will resume zoloft -continue  PRN ativan currently -acute encephalopathy making difficult to properly assess insight and mood; but patient is answering questions intermittently and following some commands.   7-hypokalemia -continue repletion and follow trend  -will check Mg level  8-acute on chronic renal failure (stage 2 at baseline) -Cr around 1.2 at baseline -on admission peaked to 1.8 -trending down and currently 1.4 -follow trend    DVT prophylaxis: lovenox Code Status: Full  Family Communication: no family at bedside Disposition Plan: remains inpatient for management of DKA, continue IV insulin and electrolytes repletion; continue IVF's and follow renal function. Still with acute metabolic encephalopathy.  Consultants:   None   Procedures:   See below for x-ray reports.   Antimicrobials:  Anti-infectives (From admission, onward)   Start     Dose/Rate Route Frequency Ordered Stop   04/05/19 2300  vancomycin (VANCOREADY) IVPB 1250 mg/250 mL     1,250 mg 166.7 mL/hr over 90 Minutes Intravenous Every 24 hours 04/04/19 2342     04/05/19 1000  ceFEPIme (MAXIPIME) 2 g in sodium chloride 0.9 % 100 mL IVPB     2 g 200 mL/hr over 30 Minutes Intravenous Every 12 hours 04/04/19 2342     04/04/19 2300  vancomycin (VANCOREADY) IVPB 1500 mg/300 mL     1,500 mg 150 mL/hr over 120 Minutes Intravenous  Once 04/04/19 2252 04/05/19 0156   04/04/19 2230  ceFEPIme (MAXIPIME) 2 g in sodium chloride 0.9 % 100 mL IVPB     2 g 200 mL/hr over 30 Minutes Intravenous  Once 04/04/19 2226     04/04/19 2230  metroNIDAZOLE (FLAGYL) IVPB 500 mg     500 mg 100 mL/hr over 60 Minutes Intravenous  Once  04/04/19 2226 04/05/19 0030   04/04/19 2230  vancomycin (VANCOCIN) IVPB 1000 mg/200 mL premix  Status:  Discontinued     1,000 mg 200 mL/hr over 60 Minutes Intravenous  Once 04/04/19 2226 04/04/19 2252       Subjective: answering questions and following commands intermittently; still restless and confused. Spiking fever.    Objective: Vitals:   04/06/19 1130 04/06/19 1145 04/06/19 1200 04/06/19 1215  BP:      Pulse: (!) 130 (!) 130 (!) 132 (!) 132  Resp:    (!) 29  Temp:      TempSrc:      SpO2: 100% 99% 100% 100%  Weight:      Height:        Intake/Output Summary (Last 24 hours) at 04/06/2019 1301 Last data filed at 04/06/2019 0655 Gross per 24 hour  Intake --  Output 2550 ml  Net -2550 ml   Filed Weights   04/04/19 1610  Weight: 86.2 kg    Examination: General exam: Alert, awake, oriented x 1-2 (intermittently, name and DOB); spiking fever, restless and requiring restrains.  Respiratory system: no crackles, no wheezing; positive rhonchi bilaterally and visible tachypnea, most likely in the setting of acidosis from DKA.  Cardiovascular system: sinus tachycardia, no rubs, no gallops, no JVD. Gastrointestinal system: Abdomen is nondistended, soft and nontender. No organomegaly or masses felt. Normal bowel sounds heard. Central nervous system: unable to properly assess with acute encephalopathy  Extremities: No C/C/E, +pedal pulses Skin: No rashes, lesions or ulcers Psychiatry: restless, agitated and unable to properly assess mood or insight with acute encephalopathy.    Data Reviewed: I have personally reviewed following labs and imaging studies  CBC: Recent Labs  Lab 04/04/19 1728  WBC 7.0  HGB 16.9  HCT 46.7  MCV 84.8  PLT [id number]*   Basic Metabolic Panel: Recent Labs  Lab 04/05/19 0217 04/05/19 1014 04/05/19 1634 04/05/19 1818 04/05/19 2216  NA 137 135 133* 137 134*  K 2.9* 3.0* 3.3* 3.0* 3.1*  CL 105 103 104 106 103  CO2 18* 19* 15* 19* 12*  GLUCOSE 163* 249* 327* 147* 312*  BUN 34* 30* 30* 28* 28*  CREATININE 1.57* 1.42* 1.45* 1.45* 1.60*  CALCIUM 8.2* 8.2* 7.8* 8.3* 8.2*   GFR: Estimated Creatinine Clearance: 55.4 mL/min (A) (by C-G formula based on SCr of 1.6 mg/dL (H)).  Coagulation Profile: Recent Labs  Lab 04/04/19 2026  INR 0.9    CBG: Recent Labs  Lab  04/06/19 0326 04/06/19 0422 04/06/19 0517 04/06/19 0632 04/06/19 1207  GLUCAP 172* 184* 189* 164* 117*   Thyroid Function Tests: Recent Labs    04/04/19 2026  TSH 2.144   Anemia Panel: Recent Labs    04/04/19 2026  VITAMINB12 1,012*   Urine analysis:    Component Value Date/Time   COLORURINE YELLOW 04/04/2019 2328   APPEARANCEUR CLEAR 04/04/2019 2328   LABSPEC 1.021 04/04/2019 2328   PHURINE 5.0 04/04/2019 2328   GLUCOSEU >=500 (A) 04/04/2019 2328   HGBUR LARGE (A) 04/04/2019 2328   BILIRUBINUR NEGATIVE 04/04/2019 2328   KETONESUR 20 (A) 04/04/2019 2328   PROTEINUR 30 (A) 04/04/2019 2328   NITRITE NEGATIVE 04/04/2019 2328   LEUKOCYTESUR NEGATIVE 04/04/2019 2328    Recent Results (from the past 240 hour(s))  Blood culture (routine x 2)     Status: None (Preliminary result)   Collection Time: 04/04/19  5:28 PM   Specimen: BLOOD  Result Value Ref Range Status   Specimen Description  BLOOD BOTTLES DRAWN AEROBIC ONLY  Final   Special Requests RIGHT ANTECUBITAL  Final   Culture   Final    NO GROWTH 2 DAYS Performed at [facility], [address] [place], [place] [address]    Report Status PENDING  Incomplete  Blood culture (routine x 2)     Status: None (Preliminary result)   Collection Time: 04/04/19  8:26 PM   Specimen: BLOOD LEFT HAND  Result Value Ref Range Status   Specimen Description BLOOD LEFT HAND  Final   Special Requests   Final    BOTTLES DRAWN AEROBIC AND ANAEROBIC Blood Culture adequate volume   Culture   Final    NO GROWTH 2 DAYS Performed at [facility], [address], [place], [place] [address]    Report Status PENDING  Incomplete  Urine culture     Status: None   Collection Time: 04/04/19 11:28 PM   Specimen: In/Out Cath Urine  Result Value Ref Range Status   Specimen Description   Final    IN/OUT CATH URINE Performed at [facility], [address] [place], [place] [address]    Special Requests   Final    NONE Performed at [facility], [address], [place], [place] [address]    Culture   Final    NO GROWTH Performed at [facility], [place] [address], [place], [place] [address]    Report Status 04/06/2019 FINAL  Final     Radiology Studies: DG CHEST PORT 1 VIEW  Result Date: 04/06/2019 CLINICAL DATA:  Shortness of breath. EXAM: PORTABLE CHEST 1 VIEW COMPARISON:  April 04, 2019 FINDINGS: The study is limited due to patient positioning. The patient had difficulty cooperating. Taking positioning into account, the heart, hila, mediastinum, lungs, and pleura are unchanged and unremarkable. No pneumothorax. IMPRESSION: Limited study due to positioning as above. No acute infiltrate or abnormality noted. Electronically Signed   By: Dorise Bullion III M.D   On: 04/06/2019 10:57   DG Chest Port 1 View  Result Date: 04/04/2019 CLINICAL DATA:  Weakness shortness of breath EXAM: PORTABLE CHEST 1 VIEW COMPARISON:  None. FINDINGS: Low lung volumes. No focal airspace disease or effusion. Normal heart size. No pneumothorax IMPRESSION: No active disease.  Low lung volume Electronically Signed   By: Donavan Foil M.D.   On: 04/04/2019 18:38   Scheduled Meds: . enoxaparin (LOVENOX) injection  40 mg Subcutaneous Q24H  . losartan  100 mg Oral Daily  . metoprolol succinate  50 mg Oral Daily   Continuous Infusions: . sodium chloride Stopped (04/04/19 2337)  . sodium chloride Stopped (04/05/19 0224)  . ceFEPime (MAXIPIME) IV Stopped (04/04/19 2243)  . ceFEPime (MAXIPIME) IV 2 g (04/06/19 1007)  . dextrose 5 % and 0.45% NaCl 75 mL/hr at 04/06/19 1003  . insulin 2 Units/hr (04/06/19 [id number])  . vancomycin Stopped (04/06/19 0643)     LOS: 1 day    Time spent: 35 minutes. Greater than 50% of this time was spent in direct contact with the patient, coordinating care and discussing relevant ongoing clinical issues, including further evaluation for acute encephalopathy, follow renal function and electrolytes, continue IV insulin for DKA  and supportive care for COVID-19 infection.Barton Dubois, MD [facility] Pager [phone]   04/06/2019, 1:01 PM

## 2019-04-06 NOTE — ED Notes (Signed)
Cooling blanket replaced

## 2019-04-06 NOTE — ED Notes (Signed)
Nursing staff in the ED very concern about patient's clinical status. ED staff report to this RN that patient is becoming more difficult to manage. Patient mentation, though encephalopathic yesterday, has continued to decline per staff. Informed Dr. Dyann Kief and spoke at length with him in regards to patient's clinical condition and nursing staff concern. Dr. Dyann Kief reports he is to see patient first thing this morning. I updated ED and ICU staff. Will continue to monitor.

## 2019-04-06 NOTE — Progress Notes (Signed)
Inpatient Diabetes Program Recommendations  AACE/ADA: New Consensus Statement on Inpatient Glycemic Control (2015)  Target Ranges:  Prepandial:   less than 140 mg/dL      Peak postprandial:   less than 180 mg/dL (1-2 hours)      Critically ill patients:  140 - 180 mg/dL   Lab Results  Component Value Date   GLUCAP 164 (H) 04/06/2019   HGBA1C 8.4 (H) 02/07/2019    Review of Glycemic Control Results for Terry [name], Terry [name]" (MRN [id number]) as of 04/06/2019 10:30  Ref. Range 04/06/2019 02:23 04/06/2019 03:26 04/06/2019 04:22 04/06/2019 05:17 04/06/2019 06:32  Glucose-Capillary Latest Ref Range: 70 - 99 mg/dL 202 (H) 172 (H) 184 (H) 189 (H) 164 (H)   Diabetes history: DM2 Outpatient Diabetes medications: Lantus 90 units daily, Victoza 1.8 mg daily, Novolog 6 units bid Current orders for Inpatient glycemic control: IV insulin  Inpatient Diabetes Program Recommendations:   Note that BMP results pending.  Once acidosis cleared, needs basal insulin 2 hours prior to d/c of insulin drip.   Consider Lantus 40 units daily (2 hours prior to d/c of insulin drip) and Novolog moderate q 4 hours.  Patient will also need meal coverage once diet ordered.    Thanks,  Adah Perl, RN, BC-ADM Inpatient Diabetes Coordinator Pager [phone] (8a-5p)

## 2019-04-06 NOTE — Progress Notes (Signed)
MD Emokpae, contacted in regards to PO meds for patient. Unable to administer due to patient NPO and cannot tolerate PO fluids/meds. Continue to monitor and await orders/response

## 2019-04-06 NOTE — Progress Notes (Signed)
Ativan not effective. Patient continues to be agitated and restless. Emokpae contacted in regards to PRN if appropriate. Continue to monitor. Awaiting orders/response.

## 2019-04-06 NOTE — ED Notes (Signed)
Pt remains restless and confused in bed

## 2019-04-06 NOTE — Progress Notes (Signed)
MD Lama contated in regards to agitation and restlessness. Also informed Patient is in need of flexiseal for liquid Type 7 stool and incontinence. Continue to monitor. Awaiting orders

## 2019-04-06 NOTE — Progress Notes (Signed)
MD Emokpae, contacted in regards to PRN meds for patient restlessness and agitation. Continue to monitor.

## 2019-04-07 ENCOUNTER — Inpatient Hospital Stay (HOSPITAL_COMMUNITY): Payer: BC Managed Care – PPO

## 2019-04-07 DIAGNOSIS — I4891 Unspecified atrial fibrillation: Secondary | ICD-10-CM

## 2019-04-07 LAB — GLUCOSE, CAPILLARY
Glucose-Capillary: 150 mg/dL — ABNORMAL HIGH (ref 70–99)
Glucose-Capillary: 157 mg/dL — ABNORMAL HIGH (ref 70–99)
Glucose-Capillary: 171 mg/dL — ABNORMAL HIGH (ref 70–99)
Glucose-Capillary: 175 mg/dL — ABNORMAL HIGH (ref 70–99)
Glucose-Capillary: 177 mg/dL — ABNORMAL HIGH (ref 70–99)
Glucose-Capillary: 178 mg/dL — ABNORMAL HIGH (ref 70–99)
Glucose-Capillary: 179 mg/dL — ABNORMAL HIGH (ref 70–99)
Glucose-Capillary: 190 mg/dL — ABNORMAL HIGH (ref 70–99)
Glucose-Capillary: 198 mg/dL — ABNORMAL HIGH (ref 70–99)
Glucose-Capillary: 209 mg/dL — ABNORMAL HIGH (ref 70–99)
Glucose-Capillary: 210 mg/dL — ABNORMAL HIGH (ref 70–99)
Glucose-Capillary: 210 mg/dL — ABNORMAL HIGH (ref 70–99)
Glucose-Capillary: 215 mg/dL — ABNORMAL HIGH (ref 70–99)
Glucose-Capillary: 221 mg/dL — ABNORMAL HIGH (ref 70–99)

## 2019-04-07 LAB — BASIC METABOLIC PANEL
Anion gap: 6 (ref 5–15)
Anion gap: 13 (ref 5–15)
Anion gap: 12 (ref 5–15)
Anion gap: 8 (ref 5–15)
Anion gap: 8 (ref 5–15)
BUN: 27 mg/dL — ABNORMAL HIGH (ref 6–20)
BUN: 25 mg/dL — ABNORMAL HIGH (ref 6–20)
BUN: 24 mg/dL — ABNORMAL HIGH (ref 6–20)
BUN: 24 mg/dL — ABNORMAL HIGH (ref 6–20)
BUN: 24 mg/dL — ABNORMAL HIGH (ref 6–20)
CO2: 20 mmol/L — ABNORMAL LOW (ref 22–32)
CO2: 19 mmol/L — ABNORMAL LOW (ref 22–32)
CO2: 19 mmol/L — ABNORMAL LOW (ref 22–32)
CO2: 20 mmol/L — ABNORMAL LOW (ref 22–32)
CO2: 20 mmol/L — ABNORMAL LOW (ref 22–32)
Calcium: 8.4 mg/dL — ABNORMAL LOW (ref 8.9–10.3)
Calcium: 9.2 mg/dL (ref 8.9–10.3)
Calcium: 8.8 mg/dL — ABNORMAL LOW (ref 8.9–10.3)
Calcium: 8.6 mg/dL — ABNORMAL LOW (ref 8.9–10.3)
Calcium: 8.5 mg/dL — ABNORMAL LOW (ref 8.9–10.3)
Chloride: 117 mmol/L — ABNORMAL HIGH (ref 98–111)
Chloride: 112 mmol/L — ABNORMAL HIGH (ref 98–111)
Chloride: 110 mmol/L (ref 98–111)
Chloride: 113 mmol/L — ABNORMAL HIGH (ref 98–111)
Chloride: 113 mmol/L — ABNORMAL HIGH (ref 98–111)
Creatinine, Ser: 1.51 mg/dL — ABNORMAL HIGH (ref 0.61–1.24)
Creatinine, Ser: 1.24 mg/dL (ref 0.61–1.24)
Creatinine, Ser: 1.14 mg/dL (ref 0.61–1.24)
Creatinine, Ser: 1.14 mg/dL (ref 0.61–1.24)
Creatinine, Ser: 1.08 mg/dL (ref 0.61–1.24)
GFR calc Af Amer: 59 mL/min — ABNORMAL LOW (ref >60)
GFR calc Af Amer: >60 mL/min (ref >60)
GFR calc Af Amer: >60 mL/min (ref >60)
GFR calc Af Amer: >60 mL/min (ref >60)
GFR calc Af Amer: >60 mL/min (ref >60)
GFR calc non Af Amer: 51 mL/min — ABNORMAL LOW (ref >60)
GFR calc non Af Amer: >60 mL/min (ref >60)
GFR calc non Af Amer: >60 mL/min (ref >60)
GFR calc non Af Amer: >60 mL/min (ref >60)
GFR calc non Af Amer: >60 mL/min (ref >60)
Glucose, Bld: 175 mg/dL — ABNORMAL HIGH (ref 70–99)
Glucose, Bld: 158 mg/dL — ABNORMAL HIGH (ref 70–99)
Glucose, Bld: 210 mg/dL — ABNORMAL HIGH (ref 70–99)
Glucose, Bld: 230 mg/dL — ABNORMAL HIGH (ref 70–99)
Glucose, Bld: 220 mg/dL — ABNORMAL HIGH (ref 70–99)
Potassium: 3.4 mmol/L — ABNORMAL LOW (ref 3.5–5.1)
Potassium: 3.6 mmol/L (ref 3.5–5.1)
Potassium: 3.1 mmol/L — ABNORMAL LOW (ref 3.5–5.1)
Potassium: 3.2 mmol/L — ABNORMAL LOW (ref 3.5–5.1)
Potassium: 3.4 mmol/L — ABNORMAL LOW (ref 3.5–5.1)
Sodium: 143 mmol/L (ref 135–145)
Sodium: 144 mmol/L (ref 135–145)
Sodium: 141 mmol/L (ref 135–145)
Sodium: 141 mmol/L (ref 135–145)
Sodium: 141 mmol/L (ref 135–145)

## 2019-04-07 LAB — MAGNESIUM: Magnesium: 2.1 mg/dL (ref 1.7–2.4)

## 2019-04-07 LAB — PROCALCITONIN: Procalcitonin: 1.04 ng/mL

## 2019-04-07 LAB — MRSA PCR SCREENING: MRSA by PCR: NEGATIVE

## 2019-04-07 MED ORDER — POTASSIUM CHLORIDE 10 MEQ/100ML IV SOLN
10.0000 meq | Freq: Once | INTRAVENOUS | Status: AC
  Administered 2019-04-07: 10 meq via INTRAVENOUS
  Filled 2019-04-07: qty 100

## 2019-04-07 MED ORDER — PANTOPRAZOLE SODIUM 40 MG IV SOLR
40.0000 mg | INTRAVENOUS | Status: DC
  Administered 2019-04-07: 40 mg via INTRAVENOUS
  Filled 2019-04-07: qty 40

## 2019-04-07 MED ORDER — POTASSIUM CHLORIDE 10 MEQ/100ML IV SOLN
10.0000 meq | INTRAVENOUS | Status: AC
  Administered 2019-04-07 (×3): 10 meq via INTRAVENOUS
  Filled 2019-04-07 (×3): qty 100

## 2019-04-07 MED ORDER — DILTIAZEM HCL-DEXTROSE 125-5 MG/125ML-% IV SOLN (PREMIX)
5.0000 mg/h | INTRAVENOUS | Status: DC
  Administered 2019-04-07: 5 mg/h via INTRAVENOUS
  Administered 2019-04-07: 7.5 mg/h via INTRAVENOUS
  Filled 2019-04-07 (×2): qty 125

## 2019-04-07 MED ORDER — SODIUM CHLORIDE 0.9 % IV SOLN: 2.0000 g | Freq: Three times a day (TID) | INTRAVENOUS | Status: DC

## 2019-04-07 MED ORDER — METOPROLOL TARTRATE 5 MG/5ML IV SOLN
5.0000 mg | Freq: Three times a day (TID) | INTRAVENOUS | Status: DC
  Administered 2019-04-07 – 2019-04-08 (×4): 5 mg via INTRAVENOUS
  Filled 2019-04-07 (×4): qty 5

## 2019-04-07 MED ORDER — ENOXAPARIN SODIUM 40 MG/0.4ML ~~LOC~~ SOLN
40.0000 mg | Freq: Two times a day (BID) | SUBCUTANEOUS | Status: DC
  Administered 2019-04-07: 40 mg via SUBCUTANEOUS
  Filled 2019-04-07 (×2): qty 0.4

## 2019-04-07 MED ORDER — SODIUM CHLORIDE 0.9 % IV SOLN
2.0000 g | Freq: Three times a day (TID) | INTRAVENOUS | Status: DC
  Administered 2019-04-07 – 2019-04-09 (×7): 2 g via INTRAVENOUS
  Filled 2019-04-07 (×7): qty 2

## 2019-04-07 MED ORDER — LORAZEPAM 2 MG/ML IJ SOLN: 1.0000 mg | INTRAMUSCULAR | Status: DC | PRN

## 2019-04-07 NOTE — Progress Notes (Signed)
Patient wrist restraints removed. Patient no longer agitated and restless. No interference of care. Patient alert and calm and cooperative. Continue to monitor. Intervene when appropriate. Patient calm and states "thank you" at removal of restraints.

## 2019-04-07 NOTE — Progress Notes (Signed)
Ankle restraints removed. Patient educated and is alert, calm and cooperative. Continue to monitor and document per protocol for wrist restraints bilaterally.

## 2019-04-07 NOTE — Progress Notes (Signed)
Stat EKG obtained. Cardizem infusion initiated. Continue to monitor patient.

## 2019-04-07 NOTE — Progress Notes (Signed)
MD [place] paged in regards to HR remaining 140-150s and Vent Bigeminy episode. Awaiting response/orders.

## 2019-04-07 NOTE — Progress Notes (Signed)
MD, [place], contacted in regards to new onset of Afib in 150-160s. Orders awaiting. Continue to monitor.

## 2019-04-07 NOTE — Progress Notes (Signed)
Pharmacy Antibiotic Note  Terry [name] is a 59 y.o. male admitted on 04/04/2019 with sepsis.  Pharmacy has been consulted for Cefepime dosing. Improved renal function, patient with DKA --> anion gap improved and normalized. PCT 1.04  Plan: Increase Cefepime 2gm IV q8h F/u cxs and clinical progress Monitor V/S, labs Deescalate tx as indicated  Height: 5\' 9"  (175.3 cm) Weight: 187 lb 9.8 oz (85.1 kg) IBW/kg (Calculated) : 70.7  Temp (24hrs), Avg:100.4 F (38 C), Min:98.1 F (36.7 C), Max:102.9 F (39.4 C)  Recent Labs  Lab 04/04/19 1728 04/04/19 2026 04/06/19 1359 04/06/19 1712 04/06/19 2026 04/07/19 0051 04/07/19 0445  WBC 7.0  --   --   --   --   --   --   CREATININE 1.82* 1.88* 1.44* 1.45* 1.46* 1.51* 1.24  LATICACIDVEN 2.5* 8.9* 1.8 1.6  --   --   --     Estimated Creatinine Clearance: 71.1 mL/min (by C-G formula based on SCr of 1.24 mg/dL).    Allergies  Allergen Reactions  . Altace [Ramipril] Cough  . Invokana [Canagliflozin] Other (See Comments)    laryngitis  . Metformin And Related Diarrhea  . Novolog [Insulin Aspart]     intolerance    Antimicrobials this admission: 1/7 Vanc >> 1/10 1/7 Cefepime >>   Microbiology results: 1/7 BCx: ngtd 1/7 UCx: no growth 1/9 MRSA PCR is negative 1/7 SARS-2 CV is positive Thank you for allowing pharmacy to be a part of this patient's care.  Isac Sarna, BS Pharm D, [place] Clinical Pharmacist Pager [phone] 04/07/2019 9:11 AM

## 2019-04-07 NOTE — Progress Notes (Signed)
*  PRELIMINARY RESULTS* Echocardiogram 2D Echocardiogram has been performed.  Terry [name] 04/07/2019, 3:24 PM

## 2019-04-07 NOTE — Progress Notes (Signed)
PROGRESS NOTE    Terry [name]  [id number] DOB: Dec 24, 1960 DOA: 04/04/2019 PCP: Kathyrn Drown, MD     Brief Narrative:  59 y.o. male, with history of diabetes mellitus type 2, hypertension, hyperlipidemia who initially presented to ED with complaints of shortness of breath, generalized weakness, body aches and fatigue.  Symptoms have been present since 1 week and have been gradually worsening.  Patient's wife and son are also sick with similar symptoms and red recent tested positive for COVID-19. SARS COVID-19 antigen test was positive in the ED.  Initially patient was alert, oriented x3 and later became combative, pulling at IV site.  Ativan was ordered, later required Geodon.  Patient was found to be in DKA and started on IV insulin.  As his mental status did not improve she was also started on Precedex infusion. Throughout this time patient's O2 sats were above 95% on room air. Chest x-ray showed no acute disease. UA was clear, UDS showed positive benzodiazepines.  Assessment & Plan: 1-Altered mental status/acute metabolic encephalopathy -in the setting of COVID-19 infection and DKA -continue constant reorientation -sitter and PRN restrains for safety if needed -continue supportive care and IVF's  2-COVID-19 infection -Repeat chest x-ray from 04/06/2019 has remained unremarkable with no acute cardiopulmonary process. -not requiring O2 supplementation (O2 sat in high 90's on RA) -patient is tachypneic due to acidosis from DKA -continue zinc and vit C -no meeting criteria for steroids or remdesivir currently -continue PRN antipyretics   3-DKA -anion gap 13 -Bicarb 19 -CBG's 150-170 currently -continue IV insulin as per endotool -follow electrolytes and renal function closely -continue IVF's -recent A1C 8.4 (Nov 2020)  4-GERD -continue PPI  5-HTN -Holding ARB and diuretics in the setting of acute kidney injury -continue IVF's -PRN hydralazine ordered -resume home  metoprolol at adjusted dose.  6-anxiety/depression -will resume zoloft when able to take p.o.'s. -continue PRN ativan currently -acute encephalopathy making difficult to properly assess insight and mood; but patient is answering questions intermittently and following some commands.   7-hypokalemia -continue repletion and follow trend  -Associated with GI losses. -will also follow magnesium level -Potassium 3.6 currently.  8-acute on chronic renal failure (stage 2 at baseline) -Cr around 1.2 at baseline -on admission peaked to 1.8 -trending down and currently 1.4 -follow trend  9-transient A. fib with RVR -In the setting most likely From Covid and DKA -No history of A. fib before -Will check 2D echo -Wean Cardizem off -Continue IV beta-blocker -Lovenox dose adjusted to every 12 hours  DVT prophylaxis: lovenox Code Status: Full  Family Communication: no family at bedside Disposition Plan: remains inpatient for management of DKA, continue IV insulin and electrolytes repletion; continue IVF's and follow renal function. Still with  metabolic encephalopathy.  Consultants:   None   Procedures:   See below for x-ray reports.   Antimicrobials:  Anti-infectives (From admission, onward)   Start     Dose/Rate Route Frequency Ordered Stop   04/07/19 1400  ceFEPIme (MAXIPIME) 2 g in sodium chloride 0.9 % 100 mL IVPB     2 g 200 mL/hr over 30 Minutes Intravenous Every 8 hours 04/07/19 0920     04/05/19 2300  vancomycin (VANCOREADY) IVPB 1250 mg/250 mL  Status:  Discontinued     1,250 mg 166.7 mL/hr over 90 Minutes Intravenous Every 24 hours 04/04/19 2342 04/07/19 0751   04/05/19 1000  ceFEPIme (MAXIPIME) 2 g in sodium chloride 0.9 % 100 mL IVPB  Status:  Discontinued  2 g 200 mL/hr over 30 Minutes Intravenous Every 12 hours 04/04/19 2342 04/07/19 0920   04/04/19 2300  vancomycin (VANCOREADY) IVPB 1500 mg/300 mL     1,500 mg 150 mL/hr over 120 Minutes Intravenous  Once  04/04/19 2252 04/05/19 0156   04/04/19 2230  ceFEPIme (MAXIPIME) 2 g in sodium chloride 0.9 % 100 mL IVPB     2 g 200 mL/hr over 30 Minutes Intravenous  Once 04/04/19 2226     04/04/19 2230  metroNIDAZOLE (FLAGYL) IVPB 500 mg     500 mg 100 mL/hr over 60 Minutes Intravenous  Once 04/04/19 2226 04/05/19 0030   04/04/19 2230  vancomycin (VANCOCIN) IVPB 1000 mg/200 mL premix  Status:  Discontinued     1,000 mg 200 mL/hr over 60 Minutes Intravenous  Once 04/04/19 2226 04/04/19 2252       Subjective: Low-grade temperature appreciated overnight; patient experienced transient A. fib with RVR, requiring IV Cardizem to be started.  Still on insulin drip.  [place]mer but obtunded at this time.  Objective: Vitals:   04/07/19 0730 04/07/19 0800 04/07/19 0830 04/07/19 0900  BP: 115/66 118/63 (!) 146/94 (!) 168/69  Pulse: (!) 103 100 (!) 106 95  Resp: 19 (!) 25 (!) 24   Temp:  99.3 F (37.4 C)    TempSrc:  Oral    SpO2: 100% 100% 100% 100%  Weight:      Height:        Intake/Output Summary (Last 24 hours) at 04/07/2019 1030 Last data filed at 04/07/2019 [id number] Gross per 24 hour  Intake 3982.11 ml  Output 2850 ml  Net 1132.11 ml   Filed Weights   04/04/19 1610 04/06/19 2000  Weight: 86.2 kg 85.1 kg    Examination: General exam: Low-grade fever appreciated on vital signs report; patient is calmer ultimately obtunded and not following commands.  No nausea, no vomiting. Respiratory system: No crackles, no wheezing, positive rhonchi bilaterally.  Overall with improvement in his tachypnea now that his acidosis is resolving.   Cardiovascular system: RRR. No murmurs, rubs, gallops. Gastrointestinal system: Abdomen is nondistended, soft and nontender. No organomegaly or masses felt. Normal bowel sounds heard. Central nervous system: Moving 4 limbs spontaneously; currently obtunded, following commands and difficult to properly assess neurologically.   Extremities: No cyanosis or clubbing; no  edema. Skin: No rashes, petechiae. Psychiatry: Patient is less agitated currently.  But still requiring restraints.   Data Reviewed: I have personally reviewed following labs and imaging studies  CBC: Recent Labs  Lab 04/04/19 1728  WBC 7.0  HGB 16.9  HCT 46.7  MCV 84.8  PLT [id number]*   Basic Metabolic Panel: Recent Labs  Lab 04/06/19 1359 04/06/19 1712 04/06/19 2026 04/07/19 0051 04/07/19 0445 04/07/19 0446  NA 140 140 141 143 144  --   K 3.3* 3.1* 3.4* 3.4* 3.6  --   CL 111 112* 113* 117* 112*  --   CO2 13* 14* 14* 20* 19*  --   GLUCOSE 164* 171* 193* 175* 158*  --   BUN 25* 26* 26* 27* 25*  --   CREATININE 1.44* 1.45* 1.46* 1.51* 1.24  --   CALCIUM 8.7* 8.8* 8.7* 8.4* 9.2  --   MG  --   --   --   --   --  2.1   GFR: Estimated Creatinine Clearance: 71.1 mL/min (by C-G formula based on SCr of 1.24 mg/dL).  Coagulation Profile: Recent Labs  Lab 04/04/19 2026  INR 0.9  CBG: Recent Labs  Lab 04/07/19 0018 04/07/19 0225 04/07/19 0423 04/07/19 0611 04/07/19 0836  GLUCAP 157* 171* 150* 177* 179*   Thyroid Function Tests: Recent Labs    04/04/19 2026  TSH 2.144   Anemia Panel: Recent Labs    04/04/19 2026  VITAMINB12 1,012*   Urine analysis:    Component Value Date/Time   COLORURINE YELLOW 04/04/2019 [place] 04/04/2019 2328   LABSPEC 1.021 04/04/2019 2328   PHURINE 5.0 04/04/2019 2328   GLUCOSEU >=500 (A) 04/04/2019 2328   HGBUR LARGE (A) 04/04/2019 2328   BILIRUBINUR NEGATIVE 04/04/2019 2328   KETONESUR 20 (A) 04/04/2019 2328   PROTEINUR 30 (A) 04/04/2019 2328   NITRITE NEGATIVE 04/04/2019 2328   LEUKOCYTESUR NEGATIVE 04/04/2019 2328    Recent Results (from the past 240 hour(s))  Blood culture (routine x 2)     Status: None (Preliminary result)   Collection Time: 04/04/19  5:28 PM   Specimen: BLOOD  Result Value Ref Range Status   Specimen Description BLOOD BOTTLES DRAWN AEROBIC ONLY  Final   Special Requests RIGHT  ANTECUBITAL  Final   Culture   Final    NO GROWTH 2 DAYS Performed at [facility], [address] [place], [place] [address]    Report Status PENDING  Incomplete  Blood culture (routine x 2)     Status: None (Preliminary result)   Collection Time: 04/04/19  8:26 PM   Specimen: BLOOD LEFT HAND  Result Value Ref Range Status   Specimen Description BLOOD LEFT HAND  Final   Special Requests   Final    BOTTLES DRAWN AEROBIC AND ANAEROBIC Blood Culture adequate volume   Culture   Final    NO GROWTH 2 DAYS Performed at [facility], [address] [place], [place] [address]    Report Status PENDING  Incomplete  Urine culture     Status: None   Collection Time: 04/04/19 11:28 PM   Specimen: In/Out Cath Urine  Result Value Ref Range Status   Specimen Description   Final    IN/OUT CATH URINE Performed at [facility], [address], [place], [place] [address]    Special Requests   Final    NONE Performed at [facility], [address] [place], [place] [address]    Culture   Final    NO GROWTH Performed at [facility], [place] [address] [place], [place] [address]    Report Status 04/06/2019 FINAL  Final  MRSA PCR Screening     Status: None   Collection Time: 04/06/19  8:08 PM   Specimen: Nasal Mucosa; Nasopharyngeal  Result Value Ref Range Status   MRSA by PCR NEGATIVE NEGATIVE Final    Comment:        The GeneXpert MRSA Assay (FDA approved for NASAL specimens only), is one component of a comprehensive MRSA colonization surveillance program. It is not intended to diagnose MRSA infection nor to guide or monitor treatment for MRSA infections. Performed at [facility], [address] [place], [place] [address]   Culture, blood (routine x 2)     Status: None (Preliminary result)   Collection Time: 04/06/19  8:24 PM   Specimen: BLOOD RIGHT HAND  Result Value Ref Range Status   Specimen Description BLOOD RIGHT HAND  Final   Special Requests   Final    BOTTLES DRAWN  AEROBIC AND ANAEROBIC Blood Culture adequate volume Performed at [facility], [address], [place],  [address]  Culture PENDING  Incomplete   Report Status PENDING  Incomplete  Culture, blood (routine x 2)     Status: None (Preliminary result)   Collection Time: 04/06/19  8:37 PM   Specimen: Right Antecubital; Blood  Result Value Ref Range Status   Specimen Description RIGHT ANTECUBITAL  Final   Special Requests   Final    BOTTLES DRAWN AEROBIC AND ANAEROBIC Blood Culture adequate volume Performed at [facility], [address] [place], [place] [address]    Culture PENDING  Incomplete   Report Status PENDING  Incomplete     Radiology Studies: DG CHEST PORT 1 VIEW  Result Date: 04/06/2019 CLINICAL DATA:  Shortness of breath. EXAM: PORTABLE CHEST 1 VIEW COMPARISON:  April 04, 2019 FINDINGS: The study is limited due to patient positioning. The patient had difficulty cooperating. Taking positioning into account, the heart, hila, mediastinum, lungs, and pleura are unchanged and unremarkable. No pneumothorax. IMPRESSION: Limited study due to positioning as above. No acute infiltrate or abnormality noted. Electronically Signed   By: Dorise Bullion III M.D   On: 04/06/2019 10:57   Scheduled Meds: . vitamin C  500 mg Oral Daily  . chlorhexidine  15 mL Mouth Rinse BID  . Chlorhexidine Gluconate Cloth  6 each Topical Daily  . enoxaparin (LOVENOX) injection  40 mg Subcutaneous Q12H  . mouth rinse  15 mL Mouth Rinse q12n4p  . metoprolol tartrate  5 mg Intravenous Q8H  . pantoprazole  40 mg Oral Daily  . sertraline  50 mg Oral Daily  . zinc sulfate  220 mg Oral Daily   Continuous Infusions: . sodium chloride Stopped (04/05/19 0224)  . ceFEPime (MAXIPIME) IV Stopped (04/04/19 2312)  . ceFEPime (MAXIPIME) IV    . dextrose 5 % and 0.45% NaCl 75 mL/hr at 04/07/19 0623  . diltiazem (CARDIZEM) infusion 10 mg/hr (04/07/19 [id number])  . insulin 3.2 mL/hr at 04/07/19 0623     LOS: 2 days     Time spent: 35 minutes. Patient with new development of A. Fib with RVR overnight. Calmer, but still obtunded. DKA improved and closer to transition off insulin drip. Continue IVF's, will get 2-D echo and will follow clinical response.     Barton Dubois, MD [facility] Pager [phone]   04/07/2019, 10:30 AM

## 2019-04-08 LAB — ECHOCARDIOGRAM COMPLETE
Height: 69 in
Weight: 3001.78 oz

## 2019-04-08 LAB — GLUCOSE, CAPILLARY
Glucose-Capillary: 148 mg/dL — ABNORMAL HIGH (ref 70–99)
Glucose-Capillary: 164 mg/dL — ABNORMAL HIGH (ref 70–99)
Glucose-Capillary: 173 mg/dL — ABNORMAL HIGH (ref 70–99)
Glucose-Capillary: 175 mg/dL — ABNORMAL HIGH (ref 70–99)
Glucose-Capillary: 177 mg/dL — ABNORMAL HIGH (ref 70–99)
Glucose-Capillary: 179 mg/dL — ABNORMAL HIGH (ref 70–99)
Glucose-Capillary: 180 mg/dL — ABNORMAL HIGH (ref 70–99)
Glucose-Capillary: 195 mg/dL — ABNORMAL HIGH (ref 70–99)
Glucose-Capillary: 198 mg/dL — ABNORMAL HIGH (ref 70–99)
Glucose-Capillary: 201 mg/dL — ABNORMAL HIGH (ref 70–99)
Glucose-Capillary: 207 mg/dL — ABNORMAL HIGH (ref 70–99)
Glucose-Capillary: 222 mg/dL — ABNORMAL HIGH (ref 70–99)
Glucose-Capillary: 248 mg/dL — ABNORMAL HIGH (ref 70–99)
Glucose-Capillary: 288 mg/dL — ABNORMAL HIGH (ref 70–99)

## 2019-04-08 LAB — BASIC METABOLIC PANEL
Anion gap: 10 (ref 5–15)
Anion gap: 8 (ref 5–15)
BUN: 22 mg/dL — ABNORMAL HIGH (ref 6–20)
BUN: 24 mg/dL — ABNORMAL HIGH (ref 6–20)
CO2: 17 mmol/L — ABNORMAL LOW (ref 22–32)
CO2: 21 mmol/L — ABNORMAL LOW (ref 22–32)
Calcium: 8.4 mg/dL — ABNORMAL LOW (ref 8.9–10.3)
Calcium: 8.8 mg/dL — ABNORMAL LOW (ref 8.9–10.3)
Chloride: 110 mmol/L (ref 98–111)
Chloride: 115 mmol/L — ABNORMAL HIGH (ref 98–111)
Creatinine, Ser: 1 mg/dL (ref 0.61–1.24)
Creatinine, Ser: 1.07 mg/dL (ref 0.61–1.24)
GFR calc Af Amer: >60 mL/min (ref >60)
GFR calc Af Amer: >60 mL/min (ref >60)
GFR calc non Af Amer: >60 mL/min (ref >60)
GFR calc non Af Amer: >60 mL/min (ref >60)
Glucose, Bld: 209 mg/dL — ABNORMAL HIGH (ref 70–99)
Glucose, Bld: 223 mg/dL — ABNORMAL HIGH (ref 70–99)
Potassium: 2.8 mmol/L — ABNORMAL LOW (ref 3.5–5.1)
Potassium: 3.4 mmol/L — ABNORMAL LOW (ref 3.5–5.1)
Sodium: 140 mmol/L (ref 135–145)
Sodium: 141 mmol/L (ref 135–145)

## 2019-04-08 MED ORDER — PANTOPRAZOLE SODIUM 40 MG PO TBEC
40.0000 mg | DELAYED_RELEASE_TABLET | Freq: Every day | ORAL | Status: DC
  Administered 2019-04-08 – 2019-04-10 (×3): 40 mg via ORAL
  Filled 2019-04-08 (×2): qty 1

## 2019-04-08 MED ORDER — POTASSIUM CHLORIDE CRYS ER 20 MEQ PO TBCR
40.0000 meq | EXTENDED_RELEASE_TABLET | ORAL | Status: AC
  Administered 2019-04-08 (×3): 40 meq via ORAL
  Filled 2019-04-08 (×3): qty 2

## 2019-04-08 MED ORDER — ROSUVASTATIN CALCIUM 20 MG PO TABS
20.0000 mg | ORAL_TABLET | Freq: Every day | ORAL | Status: DC
  Administered 2019-04-08 – 2019-04-10 (×3): 20 mg via ORAL
  Filled 2019-04-08 (×3): qty 1

## 2019-04-08 MED ORDER — INSULIN ASPART 100 UNIT/ML ~~LOC~~ SOLN
0.0000 [IU] | Freq: Every day | SUBCUTANEOUS | Status: DC
  Administered 2019-04-08: 2 [IU] via SUBCUTANEOUS
  Administered 2019-04-09: 3 [IU] via SUBCUTANEOUS

## 2019-04-08 MED ORDER — OXYCODONE-ACETAMINOPHEN 5-325 MG PO TABS
1.0000 | ORAL_TABLET | Freq: Four times a day (QID) | ORAL | Status: DC | PRN
  Administered 2019-04-08 – 2019-04-10 (×5): 1 via ORAL
  Filled 2019-04-08 (×5): qty 1

## 2019-04-08 MED ORDER — METOPROLOL SUCCINATE ER 50 MG PO TB24
50.0000 mg | ORAL_TABLET | Freq: Two times a day (BID) | ORAL | Status: DC
  Administered 2019-04-08 – 2019-04-10 (×5): 50 mg via ORAL
  Filled 2019-04-08 (×5): qty 1

## 2019-04-08 MED ORDER — PANTOPRAZOLE SODIUM 40 MG PO TBEC
40.0000 mg | DELAYED_RELEASE_TABLET | Freq: Two times a day (BID) | ORAL | Status: DC
  Filled 2019-04-08: qty 1

## 2019-04-08 MED ORDER — ENOXAPARIN SODIUM 40 MG/0.4ML ~~LOC~~ SOLN
40.0000 mg | SUBCUTANEOUS | Status: DC
  Administered 2019-04-09 – 2019-04-10 (×2): 40 mg via SUBCUTANEOUS
  Filled 2019-04-08 (×2): qty 0.4

## 2019-04-08 MED ORDER — INSULIN GLARGINE 100 UNIT/ML ~~LOC~~ SOLN
22.0000 [IU] | Freq: Two times a day (BID) | SUBCUTANEOUS | Status: DC
  Administered 2019-04-08 – 2019-04-09 (×3): 22 [IU] via SUBCUTANEOUS
  Filled 2019-04-08 (×5): qty 0.22

## 2019-04-08 MED ORDER — DILTIAZEM HCL 30 MG PO TABS
30.0000 mg | ORAL_TABLET | Freq: Three times a day (TID) | ORAL | Status: DC
  Administered 2019-04-08 – 2019-04-10 (×8): 30 mg via ORAL
  Filled 2019-04-08 (×8): qty 1

## 2019-04-08 MED ORDER — OXYCODONE HCL 5 MG PO TABS
5.0000 mg | ORAL_TABLET | Freq: Four times a day (QID) | ORAL | Status: DC | PRN
  Administered 2019-04-08 – 2019-04-10 (×5): 5 mg via ORAL
  Filled 2019-04-08 (×5): qty 1

## 2019-04-08 MED ORDER — OXYCODONE-ACETAMINOPHEN 10-325 MG PO TABS: 1.0000 | ORAL_TABLET | Freq: Four times a day (QID) | ORAL | Status: DC | PRN

## 2019-04-08 MED ORDER — PANTOPRAZOLE SODIUM 40 MG PO TBEC: 40.0000 mg | DELAYED_RELEASE_TABLET | Freq: Every day | ORAL | Status: DC

## 2019-04-08 MED ORDER — INSULIN ASPART 100 UNIT/ML ~~LOC~~ SOLN
0.0000 [IU] | Freq: Three times a day (TID) | SUBCUTANEOUS | Status: DC
  Administered 2019-04-08: 8 [IU] via SUBCUTANEOUS
  Administered 2019-04-09: 5 [IU] via SUBCUTANEOUS
  Administered 2019-04-09: 8 [IU] via SUBCUTANEOUS
  Administered 2019-04-09: 5 [IU] via SUBCUTANEOUS
  Administered 2019-04-10: 8 [IU] via SUBCUTANEOUS
  Administered 2019-04-10: 3 [IU] via SUBCUTANEOUS

## 2019-04-08 NOTE — Progress Notes (Signed)
PROGRESS NOTE    Terry [name]  [id number] DOB: 1961-03-05 DOA: 04/04/2019 PCP: Kathyrn Drown, MD     Brief Narrative:  59 y.o. male, with history of diabetes mellitus type 2, hypertension, hyperlipidemia who initially presented to ED with complaints of shortness of breath, generalized weakness, body aches and fatigue.  Symptoms have been present since 1 week and have been gradually worsening.  Patient's wife and son are also sick with similar symptoms and red recent tested positive for COVID-19. SARS COVID-19 antigen test was positive in the ED.  Initially patient was alert, oriented x3 and later became combative, pulling at IV site.  Ativan was ordered, later required Geodon.  Patient was found to be in DKA and started on IV insulin.  As his mental status did not improve she was also started on Precedex infusion. Throughout this time patient's O2 sats were above 95% on room air. Chest x-ray showed no acute disease. UA was clear, UDS showed positive benzodiazepines.  Assessment & Plan: 1-Altered mental status/acute metabolic encephalopathy -in the setting of COVID-19 infection and DKA -continue constant reorientation -Serial restraints has been discontinued -Patient currently oriented x3, following commands and no demonstrating any agitation.  2-COVID-19 infection -Repeat chest x-ray from 04/06/2019 has remained unremarkable with no acute cardiopulmonary process. -not requiring O2 supplementation (O2 sat in high 90's on RA) -patient is tachypneic due to acidosis from DKA -continue zinc and vit C -no meeting criteria for steroids or remdesivir currently; continue supportive care. -continue PRN antipyretics   3-DKA -Anion gap is closed, bicarb 21 and CBGs at target -Will transition off insulin drip and start Lantus and sliding scale. -Modified carbohydrate diet initiated. -follow electrolytes and renal function closely -recent A1C 8.4 (Nov 2020)  4-GERD -continue  PPI  5-HTN -Holding ARB and diuretics in the setting of acute kidney injury; son will be able to resume these medications after overall improvement in his renal function. -continue IVF's -Blood pressure fairly well controlled with the use of Cardizem and Lopressor. -PRN hydralazine ordered -Follow vital signs.  6-anxiety/depression -will resume zoloft however patient is able to take pills again. -continue PRN ativan currently -Mood appears to be stable; no hallucinations or suicidal ideation.  7-hypokalemia -continue repletion and follow trend  -Associated with GI losses and treatment of DKA (insulin drip). -Potassium 2.8 this morning. -Continue monitoring on telemetry.  8-acute on chronic renal failure (stage 2 at baseline) -Cr around 1.2 at baseline -on admission peaked to 1.8 -trending down and currently back to normal. -Continue to follow trend  9-transient A. fib with RVR -In the setting most likely From Covid and DKA -No prior history of A. fib before; CHADSVASC score 1-2 -Repeat electrolytes and follow telemetry. -Wean Cardizem off, using some oral Cardizem and adjust the dose of Toprol. -atrial fibrillation appears to be resolved; will change lovenox to daily.  DVT prophylaxis: lovenox Code Status: Full  Family Communication: no family at bedside Disposition Plan: remains inpatient, transition off insulin drip, advance diet, stop Cardizem drip and continue providing supportive care.  Hopefully discharge home in the next 24-48 hours.  Consultants:   None   Procedures:   2D echo 04/07/2019  1. Left ventricular ejection fraction, by visual estimation, is 65 to 70%. The left ventricle has hyperdynamic function. There is no left ventricular hypertrophy.  2. Global right ventricle has normal systolic function.The right ventricular size is normal.  3. Left atrial size was normal.  4. Right atrial size was normal.  5. The  mitral valve is normal in structure. No  evidence of mitral valve regurgitation.  6. The tricuspid valve is normal in structure.  7. The aortic valve has an indeterminant number of cusps. Aortic valve regurgitation is not visualized. Mild to moderate aortic valve sclerosis/calcification without any evidence of aortic stenosis.  8. The pulmonic valve was not well visualized. Pulmonic valve regurgitation is not visualized.  9. TR signal is inadequate for assessing pulmonary artery systolic pressure.  Antimicrobials:  Anti-infectives (From admission, onward)   Start     Dose/Rate Route Frequency Ordered Stop   04/07/19 1700  ceFEPIme (MAXIPIME) 2 g in sodium chloride 0.9 % 100 mL IVPB     2 g 200 mL/hr over 30 Minutes Intravenous Every 8 hours 04/07/19 1405     04/07/19 1400  ceFEPIme (MAXIPIME) 2 g in sodium chloride 0.9 % 100 mL IVPB  Status:  Discontinued     2 g 200 mL/hr over 30 Minutes Intravenous Every 8 hours 04/07/19 0920 04/07/19 1405   04/05/19 2300  vancomycin (VANCOREADY) IVPB 1250 mg/250 mL  Status:  Discontinued     1,250 mg 166.7 mL/hr over 90 Minutes Intravenous Every 24 hours 04/04/19 2342 04/07/19 0751   04/05/19 1000  ceFEPIme (MAXIPIME) 2 g in sodium chloride 0.9 % 100 mL IVPB  Status:  Discontinued     2 g 200 mL/hr over 30 Minutes Intravenous Every 12 hours 04/04/19 2342 04/07/19 0920   04/04/19 2300  vancomycin (VANCOREADY) IVPB 1500 mg/300 mL     1,500 mg 150 mL/hr over 120 Minutes Intravenous  Once 04/04/19 2252 04/05/19 0156   04/04/19 2230  ceFEPIme (MAXIPIME) 2 g in sodium chloride 0.9 % 100 mL IVPB     2 g 200 mL/hr over 30 Minutes Intravenous  Once 04/04/19 2226     04/04/19 2230  metroNIDAZOLE (FLAGYL) IVPB 500 mg     500 mg 100 mL/hr over 60 Minutes Intravenous  Once 04/04/19 2226 04/05/19 0030   04/04/19 2230  vancomycin (VANCOCIN) IVPB 1000 mg/200 mL premix  Status:  Discontinued     1,000 mg 200 mL/hr over 60 Minutes Intravenous  Once 04/04/19 2226 04/04/19 2252        Subjective: Low-grade temperature appreciated overnight.  Patient denies chest pain, no nausea, no vomiting, no shortness of breath.  Hemodynamically stable otherwise.  No longer in atrial fibrillation and with cyanosis tachycardia appreciated on telemetry.  Objective: Vitals:   04/08/19 0645 04/08/19 0700 04/08/19 0715 04/08/19 0740  BP: (!) 146/64 129/70 103/63   Pulse: 93 91 82 81  Resp: (!) 22 (!) 24 (!) 21 (!) 21  Temp:    99.7 F (37.6 C)  TempSrc:    Core  SpO2: 93% (!) 88% 96% 96%  Weight:      Height:        Intake/Output Summary (Last 24 hours) at 04/08/2019 0854 Last data filed at 04/08/2019 0723 Gross per 24 hour  Intake 3000.28 ml  Output 2001 ml  Net 999.28 ml   Filed Weights   04/04/19 1610 04/06/19 2000 04/08/19 0400  Weight: 86.2 kg 85.1 kg 85.8 kg    Examination: General exam: Alert, awake, oriented x 3; low-grade fever appreciated overnight.  Patient no longer required restraints and is at this moment following commands appropriately.  No chest pain, no nausea, no vomiting, reports no shortness of breath. Respiratory system: Scattered rhonchi appreciated on exam; no wheezing, no crackles, no using accessory muscle.  Still mild intermittent tachypnea  appreciated on examination of his vital signs. Cardiovascular system:RRR. No murmurs, rubs, gallops. Gastrointestinal system: Abdomen is nondistended, soft and nontender. No organomegaly or masses felt. Normal bowel sounds heard. Central nervous system: Alert and oriented. No focal neurological deficits. Extremities: No C/C/E, +pedal pulses Skin: No rashes, no petechiae. Psychiatry: Judgement and insight appear normal. Mood & affect appropriate.    Data Reviewed: I have personally reviewed following labs and imaging studies  CBC: Recent Labs  Lab 04/04/19 1728  WBC 7.0  HGB 16.9  HCT 46.7  MCV 84.8  PLT [id number]*   Basic Metabolic Panel: Recent Labs  Lab 04/07/19 0446 04/07/19 1214 04/07/19 1616  04/07/19 1945 04/08/19 0006 04/08/19 0429  NA  --  141 141 141 140 141  K  --  3.1* 3.2* 3.4* 3.4* 2.8*  CL  --  110 113* 113* 115* 110  CO2  --  19* 20* 20* 17* 21*  GLUCOSE  --  210* 230* 220* 223* 209*  BUN  --  24* 24* 24* 24* 22*  CREATININE  --  1.14 1.14 1.08 1.07 1.00  CALCIUM  --  8.8* 8.6* 8.5* 8.4* 8.8*  MG 2.1  --   --   --   --   --    GFR: Estimated Creatinine Clearance: 88.4 mL/min (by C-G formula based on SCr of 1 mg/dL).  Coagulation Profile: Recent Labs  Lab 04/04/19 2026  INR 0.9    CBG: Recent Labs  Lab 04/08/19 0307 04/08/19 0409 04/08/19 0520 04/08/19 0622 04/08/19 0721  GLUCAP 207* 222* 175* 164* 177*   Urine analysis:    Component Value Date/Time   COLORURINE YELLOW 04/04/2019 [place] 04/04/2019 2328   LABSPEC 1.021 04/04/2019 2328   PHURINE 5.0 04/04/2019 2328   GLUCOSEU >=500 (A) 04/04/2019 2328   HGBUR LARGE (A) 04/04/2019 2328   BILIRUBINUR NEGATIVE 04/04/2019 2328   KETONESUR 20 (A) 04/04/2019 2328   PROTEINUR 30 (A) 04/04/2019 2328   NITRITE NEGATIVE 04/04/2019 2328   LEUKOCYTESUR NEGATIVE 04/04/2019 2328    Recent Results (from the past 240 hour(s))  Blood culture (routine x 2)     Status: None (Preliminary result)   Collection Time: 04/04/19  5:28 PM   Specimen: BLOOD  Result Value Ref Range Status   Specimen Description BLOOD BOTTLES DRAWN AEROBIC ONLY  Final   Special Requests RIGHT ANTECUBITAL  Final   Culture   Final    NO GROWTH 4 DAYS Performed at [facility], [address] [place], [place] [address]    Report Status PENDING  Incomplete  Blood culture (routine x 2)     Status: None (Preliminary result)   Collection Time: 04/04/19  8:26 PM   Specimen: BLOOD LEFT HAND  Result Value Ref Range Status   Specimen Description BLOOD LEFT HAND  Final   Special Requests   Final    BOTTLES DRAWN AEROBIC AND ANAEROBIC Blood Culture adequate volume   Culture   Final    NO GROWTH 4 DAYS Performed at  [facility], [address] [place] [address] [place], [place] [address]    Report Status PENDING  Incomplete  Urine culture     Status: None   Collection Time: 04/04/19 11:28 PM   Specimen: In/Out Cath Urine  Result Value Ref Range Status   Specimen Description   Final    IN/OUT CATH URINE Performed at [facility], [address], [place], [place] [address]    Special Requests   Final  NONE Performed at [facility], [address] [place], [place] [address]    Culture   Final    NO GROWTH Performed at [facility], [place] [address] [place], [place] [address]    Report Status 04/06/2019 FINAL  Final  MRSA PCR Screening     Status: None   Collection Time: 04/06/19  8:08 PM   Specimen: Nasal Mucosa; Nasopharyngeal  Result Value Ref Range Status   MRSA by PCR NEGATIVE NEGATIVE Final    Comment:        The GeneXpert MRSA Assay (FDA approved for NASAL specimens only), is one component of a comprehensive MRSA colonization surveillance program. It is not intended to diagnose MRSA infection nor to guide or monitor treatment for MRSA infections. Performed at [facility], [address], [place], [place] [address]   Culture, blood (routine x 2)     Status: None (Preliminary result)   Collection Time: 04/06/19  8:24 PM   Specimen: BLOOD RIGHT HAND  Result Value Ref Range Status   Specimen Description BLOOD RIGHT HAND  Final   Special Requests   Final    BOTTLES DRAWN AEROBIC AND ANAEROBIC Blood Culture adequate volume   Culture   Final    NO GROWTH 2 DAYS Performed at [facility], [address] [place], [place] [address]    Report Status PENDING  Incomplete  Culture, blood (routine x 2)     Status: None (Preliminary result)   Collection Time: 04/06/19  8:37 PM   Specimen: Right Antecubital; Blood  Result Value Ref Range Status   Specimen Description RIGHT ANTECUBITAL  Final   Special Requests   Final    BOTTLES DRAWN AEROBIC AND ANAEROBIC Blood Culture adequate volume   Culture    Final    NO GROWTH 2 DAYS Performed at [facility], [address] [place], [place] [address]    Report Status PENDING  Incomplete     Radiology Studies: DG CHEST PORT 1 VIEW  Result Date: 04/06/2019 CLINICAL DATA:  Shortness of breath. EXAM: PORTABLE CHEST 1 VIEW COMPARISON:  April 04, 2019 FINDINGS: The study is limited due to patient positioning. The patient had difficulty cooperating. Taking positioning into account, the heart, hila, mediastinum, lungs, and pleura are unchanged and unremarkable. No pneumothorax. IMPRESSION: Limited study due to positioning as above. No acute infiltrate or abnormality noted. Electronically Signed   By: Dorise Bullion III M.D   On: 04/06/2019 10:57   ECHOCARDIOGRAM COMPLETE  Result Date: 04/08/2019   ECHOCARDIOGRAM REPORT   Patient Name:   QUALIN VAZIRI Date of Exam: 04/07/2019 Medical Rec #:  [id number]      Height:       69.0 in Accession #:    [id number]     Weight:       187.6 lb Date of Birth:  [birth year]       BSA:          2.01 m Patient Age:    60 years       BP:           126/73 mmHg Patient Gender: M              HR:           84 bpm. Exam Location:  [facility] Procedure: 2D Echo Indications:    Atrial Fibrillation 427.31 / I48.91  History:        Patient has no prior history of Echocardiogram examinations.  Risk Factors:Diabetes, Dyslipidemia, Former Smoker and                 Hypertension. GERD, COVID 19 positive.  Sonographer:    Leavy Cella RDCS (AE) Referring Phys: [place]  1. Left ventricular ejection fraction, by visual estimation, is 65 to 70%. The left ventricle has hyperdynamic function. There is no left ventricular hypertrophy.  2. Global right ventricle has normal systolic function.The right ventricular size is normal.  3. Left atrial size was normal.  4. Right atrial size was normal.  5. The mitral valve is normal in structure. No evidence of mitral valve regurgitation.  6. The tricuspid valve is  normal in structure.  7. The aortic valve has an indeterminant number of cusps. Aortic valve regurgitation is not visualized. Mild to moderate aortic valve sclerosis/calcification without any evidence of aortic stenosis.  8. The pulmonic valve was not well visualized. Pulmonic valve regurgitation is not visualized.  9. TR signal is inadequate for assessing pulmonary artery systolic pressure. FINDINGS  Left Ventricle: Left ventricular ejection fraction, by visual estimation, is 65 to 70%. The left ventricle has hyperdynamic function. The left ventricle has no regional wall motion abnormalities. There is no left ventricular hypertrophy. Left ventricular diastolic parameters were normal. Right Ventricle: The right ventricular size is mildly enlarged. No increase in right ventricular wall thickness. Global RV systolic function is has normal systolic function. Left Atrium: Left atrial size was normal in size. Right Atrium: Right atrial size was normal in size Pericardium: There is no evidence of pericardial effusion. Mitral Valve: The mitral valve is normal in structure. No evidence of mitral valve regurgitation. Tricuspid Valve: The tricuspid valve is normal in structure. Tricuspid valve regurgitation is not demonstrated. Aortic Valve: The aortic valve has an indeterminant number of cusps. Aortic valve regurgitation is not visualized. Mild to moderate aortic valve sclerosis/calcification is present, without any evidence of aortic stenosis. Pulmonic Valve: The pulmonic valve was not well visualized. Pulmonic valve regurgitation is not visualized. Pulmonic regurgitation is not visualized. Aorta: The aortic root is normal in size and structure. IAS/Shunts: The interatrial septum was not well visualized.  LEFT VENTRICLE PLAX 2D LVIDd:         4.12 cm  Diastology LVIDs:         2.54 cm  LV e' lateral:   5.87 cm/s LV PW:         1.06 cm  LV E/e' lateral: 11.0 LV IVS:        1.03 cm  LV e' medial:    4.24 cm/s LVOT diam:      2.00 cm  LV E/e' medial:  15.2 LV SV:         52 ml LV SV Index:   25.27 LVOT Area:     3.14 cm  RIGHT VENTRICLE RV S prime:     22.70 cm/s TAPSE (M-mode): 1.7 cm LEFT ATRIUM             Index       RIGHT ATRIUM           Index LA diam:        3.30 cm 1.64 cm/m  RA Area:     10.10 cm LA Vol (A2C):   35.0 ml 17.41 ml/m RA Volume:   17.50 ml  8.70 ml/m LA Vol (A4C):   55.5 ml 27.60 ml/m LA Biplane Vol: 48.4 ml 24.07 ml/m   AORTA Ao Root diam: 3.10 cm MITRAL VALVE MV  Area (PHT): 3.42 cm             SHUNTS MV PHT:        64.38 msec           Systemic Diam: 2.00 cm MV Decel Time: 222 msec MV E velocity: 64.50 cm/s 103 cm/s MV A velocity: 66.50 cm/s 70.3 cm/s MV E/A ratio:  0.97       1.5  Oswaldo Milian MD Electronically signed by Oswaldo Milian MD Signature Date/Time: 04/08/2019/12:25:51 AM    Final    Scheduled Meds: . vitamin C  500 mg Oral Daily  . chlorhexidine  15 mL Mouth Rinse BID  . Chlorhexidine Gluconate Cloth  6 each Topical Daily  . diltiazem  30 mg Oral Q8H  . enoxaparin (LOVENOX) injection  40 mg Subcutaneous Q12H  . insulin aspart  0-15 Units Subcutaneous TID WC  . insulin aspart  0-5 Units Subcutaneous QHS  . insulin glargine  22 Units Subcutaneous BID  . mouth rinse  15 mL Mouth Rinse q12n4p  . metoprolol succinate  50 mg Oral BID  . pantoprazole  40 mg Oral Daily  . rosuvastatin  20 mg Oral Daily  . sertraline  50 mg Oral Daily  . zinc sulfate  220 mg Oral Daily   Continuous Infusions: . sodium chloride Stopped (04/05/19 0224)  . ceFEPime (MAXIPIME) IV Stopped (04/04/19 2312)  . ceFEPime (MAXIPIME) IV Stopped (04/08/19 0042)  . insulin 3.6 mL/hr at 04/08/19 0723     LOS: 3 days    Time spent: 35 minutes.  Significantly improved, no longer agitated, restless or requiring restraints.  DKA process appears to be resolved and the patient will be transition off insulin drip this morning.  For now we will continue with IV antibiotics and will try to stop Cardizem  drip.  Per telemetry evaluation sinus tachycardia appreciated.  Patient denies chest pain.  No shortness of breath and good oxygen saturation on room air.  Wife updated and all question answered  over the phone.    Barton Dubois, MD [facility] Pager [phone]   04/08/2019, 8:54 AM

## 2019-04-08 NOTE — Progress Notes (Signed)
Patient has not voided since catheter removal. Patient bladder scanned with only 12 and 33mL of urine resulting. Patient has been NPO for 4 days and is currently eating cups of ice, 1 nearly every hour. Continue to monitor urine output. Condom catheter in place.

## 2019-04-08 NOTE — Progress Notes (Signed)
Patient alert oriented resting quietly in bed on entering the room. During my assessment asked patient about his pain score. He said "pretty bad". I told him to rate it between 0-10. He said "8/10, all over but especially my tongue and back and head". I notified the MD for his home pain medication. Discussed with the MD about this patient being altered mental status and combative upon admission. I told him however, the urine screen was negative for opiates and he reported his last use 1/6. I also relayed to the MD that the patient was very much alert, oriented, appropriate, and was following commands, cooperative, and pleasant. New order received for his home pain medication.

## 2019-04-09 LAB — BASIC METABOLIC PANEL
Anion gap: 7 (ref 5–15)
BUN: 26 mg/dL — ABNORMAL HIGH (ref 6–20)
CO2: 20 mmol/L — ABNORMAL LOW (ref 22–32)
Calcium: 8.8 mg/dL — ABNORMAL LOW (ref 8.9–10.3)
Chloride: 107 mmol/L (ref 98–111)
Creatinine, Ser: 0.98 mg/dL (ref 0.61–1.24)
GFR calc Af Amer: >60 mL/min (ref >60)
GFR calc non Af Amer: >60 mL/min (ref >60)
Glucose, Bld: 307 mg/dL — ABNORMAL HIGH (ref 70–99)
Potassium: 3.8 mmol/L (ref 3.5–5.1)
Sodium: 134 mmol/L — ABNORMAL LOW (ref 135–145)

## 2019-04-09 LAB — GLUCOSE, CAPILLARY
Glucose-Capillary: 289 mg/dL — ABNORMAL HIGH (ref 70–99)
Glucose-Capillary: 244 mg/dL — ABNORMAL HIGH (ref 70–99)
Glucose-Capillary: 239 mg/dL — ABNORMAL HIGH (ref 70–99)
Glucose-Capillary: 256 mg/dL — ABNORMAL HIGH (ref 70–99)

## 2019-04-09 LAB — C-REACTIVE PROTEIN: CRP: 16.2 mg/dL — ABNORMAL HIGH (ref <1.0)

## 2019-04-09 MED ORDER — SACCHAROMYCES BOULARDII 250 MG PO CAPS
250.0000 mg | ORAL_CAPSULE | Freq: Two times a day (BID) | ORAL | Status: DC
  Administered 2019-04-09 – 2019-04-10 (×2): 250 mg via ORAL
  Filled 2019-04-09 (×2): qty 1

## 2019-04-09 MED ORDER — DIPHENOXYLATE-ATROPINE 2.5-0.025 MG PO TABS
1.0000 | ORAL_TABLET | Freq: Four times a day (QID) | ORAL | Status: DC | PRN
  Administered 2019-04-09 – 2019-04-10 (×2): 1 via ORAL
  Filled 2019-04-09 (×2): qty 1

## 2019-04-09 MED ORDER — INSULIN GLARGINE 100 UNIT/ML ~~LOC~~ SOLN
30.0000 [IU] | Freq: Two times a day (BID) | SUBCUTANEOUS | Status: DC
  Administered 2019-04-09 – 2019-04-10 (×2): 30 [IU] via SUBCUTANEOUS
  Filled 2019-04-09 (×4): qty 0.3

## 2019-04-09 MED ORDER — INSULIN ASPART 100 UNIT/ML ~~LOC~~ SOLN
4.0000 [IU] | Freq: Three times a day (TID) | SUBCUTANEOUS | Status: DC
  Administered 2019-04-10 (×2): 4 [IU] via SUBCUTANEOUS

## 2019-04-09 NOTE — Progress Notes (Signed)
PROGRESS NOTE    Terry [name]  [id number] DOB: 1960/08/05 DOA: 04/04/2019 PCP: Kathyrn Drown, MD     Brief Narrative:  59 y.o. male, with history of diabetes mellitus type 2, hypertension, hyperlipidemia who initially presented to ED with complaints of shortness of breath, generalized weakness, body aches and fatigue.  Symptoms have been present since 1 week and have been gradually worsening.  Patient's wife and son are also sick with similar symptoms and red recent tested positive for COVID-19. SARS COVID-19 antigen test was positive in the ED.  Initially patient was alert, oriented x3 and later became combative, pulling at IV site.  Ativan was ordered, later required Geodon.  Patient was found to be in DKA and started on IV insulin.  As his mental status did not improve she was also started on Precedex infusion. Throughout this time patient's O2 sats were above 95% on room air. Chest x-ray showed no acute disease. UA was clear, UDS showed positive benzodiazepines.  Assessment & Plan: 1-Altered mental status/acute metabolic encephalopathy -in the setting of COVID-19 infection and DKA -continue constant reorientation -Serial restraints has been discontinued -Patient currently oriented x3, following commands and no demonstrating any agitation.  2-COVID-19 infection -Repeat chest x-ray from 04/06/2019 has remained unremarkable with no acute cardiopulmonary process. -not requiring O2 supplementation (O2 sat in high 90's on RA) -patient is tachypneic due to acidosis from DKA -continue zinc and vit C -no meeting criteria for steroids or remdesivir currently; continue supportive care. -continue PRN antipyretics  -Will use Lomotil and Florastor for associated diarrhea. -Patient denies abdominal pain, nausea/vomiting  3-DKA -DKA is resolved. -Continue sliding scale insulin and further adjust Lantus for better CBGs control. -Modified carbohydrate diet initiated. -follow electrolytes  and renal function closely -recent A1C 8.4 (Nov 2020)  4-GERD -continue PPI  5-HTN -Continue holding ARB and diuretics in the setting of acute kidney injury; son will be able to resume these medications after overall improvement in his renal function. -continue IVF's -Blood pressure fairly well controlled with the use of Cardizem and Lopressor. -PRN hydralazine ordered -Follow vital signs.  6-anxiety/depression -will resume zoloft however patient is able to take pills again. -continue PRN ativan currently -Mood appears to be stable; no hallucinations or suicidal ideation.  7-hypokalemia -continue repletion and follow trend  -Associated with GI losses and treatment of DKA (insulin drip). -Potassium 3.8 this morning.  8-acute on chronic renal failure (stage 2 at baseline) -Cr around 1.2 at baseline -on admission peaked to 1.8 -trending down and currently back to normal. -Continue to follow trend  9-transient A. fib with RVR -In the setting most likely From Covid and DKA -No prior history of A. fib before; CHADSVASC score 1-2 -Repeat electrolytes and follow telemetry. -Continue current dose of Cardizem and adjusted dose of metoprolol; rate is controlled and stable. -Will recommend at discharge the use of aspirin for secondary prevention. -atrial fibrillation appears to be resolved; continue daily Lovenox. -Continue monitoring on telemetry.  DVT prophylaxis: lovenox Code Status: Full  Family Communication: no family at bedside Disposition Plan: remains inpatient, transfer to telemetry bed.  As physical therapy to provide evaluation.  Continue adjusting insulin therapy.  Hopefully home in the next 24 hours.  Consultants:   None   Procedures:   2D echo 04/07/2019  1. Left ventricular ejection fraction, by visual estimation, is 65 to 70%. The left ventricle has hyperdynamic function. There is no left ventricular hypertrophy.  2. Global right ventricle has normal systolic  function.The right  ventricular size is normal.  3. Left atrial size was normal.  4. Right atrial size was normal.  5. The mitral valve is normal in structure. No evidence of mitral valve regurgitation.  6. The tricuspid valve is normal in structure.  7. The aortic valve has an indeterminant number of cusps. Aortic valve regurgitation is not visualized. Mild to moderate aortic valve sclerosis/calcification without any evidence of aortic stenosis.  8. The pulmonic valve was not well visualized. Pulmonic valve regurgitation is not visualized.  9. TR signal is inadequate for assessing pulmonary artery systolic pressure.  Antimicrobials:  Anti-infectives (From admission, onward)   Start     Dose/Rate Route Frequency Ordered Stop   04/07/19 1700  ceFEPIme (MAXIPIME) 2 g in sodium chloride 0.9 % 100 mL IVPB  Status:  Discontinued     2 g 200 mL/hr over 30 Minutes Intravenous Every 8 hours 04/07/19 1405 04/09/19 1816   04/07/19 1400  ceFEPIme (MAXIPIME) 2 g in sodium chloride 0.9 % 100 mL IVPB  Status:  Discontinued     2 g 200 mL/hr over 30 Minutes Intravenous Every 8 hours 04/07/19 0920 04/07/19 1405   04/05/19 2300  vancomycin (VANCOREADY) IVPB 1250 mg/250 mL  Status:  Discontinued     1,250 mg 166.7 mL/hr over 90 Minutes Intravenous Every 24 hours 04/04/19 2342 04/07/19 0751   04/05/19 1000  ceFEPIme (MAXIPIME) 2 g in sodium chloride 0.9 % 100 mL IVPB  Status:  Discontinued     2 g 200 mL/hr over 30 Minutes Intravenous Every 12 hours 04/04/19 2342 04/07/19 0920   04/04/19 2300  vancomycin (VANCOREADY) IVPB 1500 mg/300 mL     1,500 mg 150 mL/hr over 120 Minutes Intravenous  Once 04/04/19 2252 04/05/19 0156   04/04/19 2230  ceFEPIme (MAXIPIME) 2 g in sodium chloride 0.9 % 100 mL IVPB  Status:  Discontinued     2 g 200 mL/hr over 30 Minutes Intravenous  Once 04/04/19 2226 04/09/19 1816   04/04/19 2230  metroNIDAZOLE (FLAGYL) IVPB 500 mg     500 mg 100 mL/hr over 60 Minutes Intravenous  Once  04/04/19 2226 04/05/19 0030   04/04/19 2230  vancomycin (VANCOCIN) IVPB 1000 mg/200 mL premix  Status:  Discontinued     1,000 mg 200 mL/hr over 60 Minutes Intravenous  Once 04/04/19 2226 04/04/19 2252       Subjective: Afebrile, no chest pain, no nausea, no vomiting, no shortness of breath.  Hemodynamically stable.  Following commands appropriately.  Denying palpitations and just complaining of diarrhea, feeling weak and with poor appetite.  Objective: Vitals:   04/09/19 1500 04/09/19 1600 04/09/19 1642 04/09/19 1700  BP: (!) 145/84 (!) 141/80  (!) 144/84  Pulse: 79 85 86 86  Resp:      Temp:   98 F (36.7 C)   TempSrc:   Oral   SpO2: 98% 96% 100% 96%  Weight:      Height:        Intake/Output Summary (Last 24 hours) at 04/09/2019 1819 Last data filed at 04/08/2019 2000 Gross per 24 hour  Intake 240 ml  Output 650 ml  Net -410 ml   Filed Weights   04/04/19 1610 04/06/19 2000 04/08/19 0400  Weight: 86.2 kg 85.1 kg 85.8 kg    Examination: General exam: Alert, awake, oriented x 3; no fever, no requiring oxygen supplementation, no chest pain, no shortness of breath, no nausea, no vomiting. Respiratory system: Scattered rhonchi on exam; no wheezing, no crackles,  no using accessory muscle.  Cardiovascular system:RRR. No murmurs, rubs, gallops. Gastrointestinal system: Abdomen is nondistended, soft and nontender. No organomegaly or masses felt. Normal bowel sounds heard. Central nervous system: Alert and oriented. No focal neurological deficits. Extremities: No C/C/E, +pedal pulses Skin: No rashes, lesions or ulcers Psychiatry: Judgement and insight appear normal. Mood & affect appropriate.     Data Reviewed: I have personally reviewed following labs and imaging studies  CBC: Recent Labs  Lab 04/04/19 1728  WBC 7.0  HGB 16.9  HCT 46.7  MCV 84.8  PLT [id number]*   Basic Metabolic Panel: Recent Labs  Lab 04/07/19 0446 04/07/19 1616 04/07/19 1945 04/08/19 0006  04/08/19 0429 04/09/19 0843  NA  --  141 141 140 141 134*  K  --  3.2* 3.4* 3.4* 2.8* 3.8  CL  --  113* 113* 115* 110 107  CO2  --  20* 20* 17* 21* 20*  GLUCOSE  --  230* 220* 223* 209* 307*  BUN  --  24* 24* 24* 22* 26*  CREATININE  --  1.14 1.08 1.07 1.00 0.98  CALCIUM  --  8.6* 8.5* 8.4* 8.8* 8.8*  MG 2.1  --   --   --   --   --    GFR: Estimated Creatinine Clearance: 90.2 mL/min (by C-G formula based on SCr of 0.98 mg/dL).  Coagulation Profile: Recent Labs  Lab 04/04/19 2026  INR 0.9    CBG: Recent Labs  Lab 04/08/19 1601 04/08/19 2041 04/09/19 0750 04/09/19 1204 04/09/19 1641  GLUCAP 288* 248* 289* 244* 239*   Urine analysis:    Component Value Date/Time   COLORURINE YELLOW 04/04/2019 [place] 04/04/2019 2328   LABSPEC 1.021 04/04/2019 2328   PHURINE 5.0 04/04/2019 2328   GLUCOSEU >=500 (A) 04/04/2019 2328   HGBUR LARGE (A) 04/04/2019 2328   BILIRUBINUR NEGATIVE 04/04/2019 2328   KETONESUR 20 (A) 04/04/2019 2328   PROTEINUR 30 (A) 04/04/2019 2328   NITRITE NEGATIVE 04/04/2019 2328   LEUKOCYTESUR NEGATIVE 04/04/2019 2328    Recent Results (from the past 240 hour(s))  Blood culture (routine x 2)     Status: None (Preliminary result)   Collection Time: 04/04/19  5:28 PM   Specimen: BLOOD  Result Value Ref Range Status   Specimen Description BLOOD BOTTLES DRAWN AEROBIC ONLY  Final   Special Requests RIGHT ANTECUBITAL  Final   Culture   Final    NO GROWTH 4 DAYS Performed at [facility], [address] [place], [place] [address]    Report Status PENDING  Incomplete  Blood culture (routine x 2)     Status: None (Preliminary result)   Collection Time: 04/04/19  8:26 PM   Specimen: BLOOD LEFT HAND  Result Value Ref Range Status   Specimen Description BLOOD LEFT HAND  Final   Special Requests   Final    BOTTLES DRAWN AEROBIC AND ANAEROBIC Blood Culture adequate volume   Culture   Final    NO GROWTH 4 DAYS Performed at [facility], [address] [place], [place] [address]    Report Status PENDING  Incomplete  Urine culture     Status: None   Collection Time: 04/04/19 11:28 PM   Specimen: In/Out Cath Urine  Result Value Ref Range Status   Specimen Description   Final    IN/OUT CATH URINE Performed at [facility], [address], [place],  [address]    Special Requests   Final  NONE Performed at [facility], [address], [place], [place] [address]    Culture   Final    NO GROWTH Performed at [facility], [place] [address], [place], [place] [address]    Report Status 04/06/2019 FINAL  Final  MRSA PCR Screening     Status: None   Collection Time: 04/06/19  8:08 PM   Specimen: Nasal Mucosa; Nasopharyngeal  Result Value Ref Range Status   MRSA by PCR NEGATIVE NEGATIVE Final    Comment:        The GeneXpert MRSA Assay (FDA approved for NASAL specimens only), is one component of a comprehensive MRSA colonization surveillance program. It is not intended to diagnose MRSA infection nor to guide or monitor treatment for MRSA infections. Performed at [facility], [address], [place], [place] [address]   Culture, blood (routine x 2)     Status: None (Preliminary result)   Collection Time: 04/06/19  8:24 PM   Specimen: BLOOD RIGHT HAND  Result Value Ref Range Status   Specimen Description BLOOD RIGHT HAND  Final   Special Requests   Final    BOTTLES DRAWN AEROBIC AND ANAEROBIC Blood Culture adequate volume   Culture   Final    NO GROWTH 2 DAYS Performed at [facility], [address] [place], [place] [address]    Report Status PENDING  Incomplete  Culture, blood (routine x 2)     Status: None (Preliminary result)   Collection Time: 04/06/19  8:37 PM   Specimen: Right Antecubital; Blood  Result Value Ref Range Status   Specimen Description RIGHT ANTECUBITAL  Final   Special Requests   Final    BOTTLES DRAWN AEROBIC AND ANAEROBIC Blood Culture adequate volume   Culture   Final     NO GROWTH 2 DAYS Performed at [facility], [address] [place], [place] [address]    Report Status PENDING  Incomplete     Radiology Studies: No results found. Scheduled Meds: . vitamin C  500 mg Oral Daily  . chlorhexidine  15 mL Mouth Rinse BID  . Chlorhexidine Gluconate Cloth  6 each Topical Daily  . diltiazem  30 mg Oral Q8H  . enoxaparin (LOVENOX) injection  40 mg Subcutaneous Q24H  . insulin aspart  0-15 Units Subcutaneous TID WC  . insulin aspart  0-5 Units Subcutaneous QHS  . [START ON 04/10/2019] insulin aspart  4 Units Subcutaneous TID WC  . insulin glargine  30 Units Subcutaneous BID  . mouth rinse  15 mL Mouth Rinse q12n4p  . metoprolol succinate  50 mg Oral BID  . pantoprazole  40 mg Oral Daily  . rosuvastatin  20 mg Oral Daily  . saccharomyces boulardii  250 mg Oral BID  . sertraline  50 mg Oral Daily  . zinc sulfate  220 mg Oral Daily   Continuous Infusions: . sodium chloride Stopped (04/05/19 0224)     LOS: 4 days    Time spent: 35 minutes.    Barton Dubois, MD [facility] Pager [phone]   04/09/2019, 6:19 PM

## 2019-04-09 NOTE — Progress Notes (Signed)
Inpatient Diabetes Program Recommendations  AACE/ADA: New Consensus Statement on Inpatient Glycemic Control (2015)  Target Ranges:  Prepandial:   less than 140 mg/dL      Peak postprandial:   less than 180 mg/dL (1-2 hours)      Critically ill patients:  140 - 180 mg/dL   Lab Results  Component Value Date   GLUCAP 289 (H) 04/09/2019   HGBA1C 8.4 (H) 02/07/2019    Review of Glycemic Control Results for Terry [name], Terry [name]" (MRN [id number]) as of 04/09/2019 09:23  Ref. Range 04/08/2019 07:21 04/08/2019 09:17 04/08/2019 10:55 04/08/2019 11:59 04/08/2019 16:01 04/08/2019 20:41 04/09/2019 07:50  Glucose-Capillary Latest Ref Range: 70 - 99 mg/dL 177 (H) 148 (H) 201 (H) 198 (H) 288 (H) 248 (H) 289 (H)   Diabetes history: DM 2 Outpatient Diabetes medications: Lantus 90 units bid, Novolog 6 bid, Victoza 1.8 mg Daily Current orders for Inpatient glycemic control:  Lantus 22 units bid Novolog 0-15 units tid + hs  Inpatient Diabetes Program Recommendations:    -  Consider increasing Lantus to 30 units bid  -  Also consider Novolog 4 units tid meal coverage if eating >50% of meals.  Thanks,  Tama Headings RN, MSN, BC-ADM Inpatient Diabetes Coordinator Team Pager [phone] (8a-5p)

## 2019-04-10 DIAGNOSIS — I4891 Unspecified atrial fibrillation: Secondary | ICD-10-CM | POA: Diagnosis present

## 2019-04-10 DIAGNOSIS — U071 COVID-19: Secondary | ICD-10-CM | POA: Diagnosis present

## 2019-04-10 DIAGNOSIS — G9341 Metabolic encephalopathy: Secondary | ICD-10-CM | POA: Diagnosis present

## 2019-04-10 DIAGNOSIS — J069 Acute upper respiratory infection, unspecified: Secondary | ICD-10-CM | POA: Diagnosis present

## 2019-04-10 LAB — CBC
HCT: 36.8 % — ABNORMAL LOW (ref 39.0–52.0)
Hemoglobin: 13.1 g/dL (ref 13.0–17.0)
MCH: 30.8 pg (ref 26.0–34.0)
MCHC: 35.6 g/dL (ref 30.0–36.0)
MCV: 86.6 fL (ref 80.0–100.0)
Platelets: 260 10*3/uL (ref 150–400)
RBC: 4.25 MIL/uL (ref 4.22–5.81)
RDW: 12.2 % (ref 11.5–15.5)
WBC: 10.1 10*3/uL (ref 4.0–10.5)
nRBC: 0 % (ref 0.0–0.2)

## 2019-04-10 LAB — BASIC METABOLIC PANEL
Anion gap: 10 (ref 5–15)
BUN: 21 mg/dL — ABNORMAL HIGH (ref 6–20)
CO2: 21 mmol/L — ABNORMAL LOW (ref 22–32)
Calcium: 9.1 mg/dL (ref 8.9–10.3)
Chloride: 107 mmol/L (ref 98–111)
Creatinine, Ser: 0.9 mg/dL (ref 0.61–1.24)
GFR calc Af Amer: >60 mL/min (ref >60)
GFR calc non Af Amer: >60 mL/min (ref >60)
Glucose, Bld: 156 mg/dL — ABNORMAL HIGH (ref 70–99)
Potassium: 3.2 mmol/L — ABNORMAL LOW (ref 3.5–5.1)
Sodium: 138 mmol/L (ref 135–145)

## 2019-04-10 LAB — GLUCOSE, CAPILLARY
Glucose-Capillary: 194 mg/dL — ABNORMAL HIGH (ref 70–99)
Glucose-Capillary: 279 mg/dL — ABNORMAL HIGH (ref 70–99)

## 2019-04-10 LAB — CULTURE, BLOOD (ROUTINE X 2)
Culture: NO GROWTH
Culture: NO GROWTH
Special Requests: ADEQUATE

## 2019-04-10 LAB — MAGNESIUM: Magnesium: 1.9 mg/dL (ref 1.7–2.4)

## 2019-04-10 MED ORDER — ALPRAZOLAM 0.5 MG PO TABS: 0.5000 mg | ORAL_TABLET | Freq: Every evening | ORAL | 0 refills | Status: DC | PRN

## 2019-04-10 MED ORDER — POTASSIUM CHLORIDE CRYS ER 20 MEQ PO TBCR
40.0000 meq | EXTENDED_RELEASE_TABLET | ORAL | Status: DC
  Administered 2019-04-10: 40 meq via ORAL
  Filled 2019-04-10: qty 2

## 2019-04-10 MED ORDER — LANTUS SOLOSTAR 100 UNIT/ML ~~LOC~~ SOPN: 40.0000 [IU] | PEN_INJECTOR | Freq: Two times a day (BID) | SUBCUTANEOUS | 0 refills | Status: DC

## 2019-04-10 MED ORDER — ASPIRIN EC 81 MG PO TBEC: 81.0000 mg | DELAYED_RELEASE_TABLET | Freq: Every day | ORAL | 3 refills | Status: AC

## 2019-04-10 MED ORDER — METOPROLOL SUCCINATE ER 50 MG PO TB24: 50.0000 mg | ORAL_TABLET | Freq: Two times a day (BID) | ORAL | 3 refills | Status: DC

## 2019-04-10 MED ORDER — POTASSIUM CHLORIDE CRYS ER 20 MEQ PO TBCR
40.0000 meq | EXTENDED_RELEASE_TABLET | ORAL | Status: AC
  Administered 2019-04-10 (×2): 40 meq via ORAL
  Filled 2019-04-10 (×2): qty 2

## 2019-04-10 MED ORDER — DILTIAZEM HCL ER COATED BEADS 120 MG PO CP24: 120.0000 mg | ORAL_CAPSULE | Freq: Every day | ORAL | 3 refills | Status: DC

## 2019-04-10 MED ORDER — ASCORBIC ACID 500 MG PO TABS: 500.0000 mg | ORAL_TABLET | Freq: Every day | ORAL | 2 refills | Status: DC

## 2019-04-10 MED ORDER — ZINC SULFATE 220 (50 ZN) MG PO CAPS: 220.0000 mg | ORAL_CAPSULE | Freq: Every day | ORAL | 1 refills | Status: DC

## 2019-04-10 MED ORDER — SACCHAROMYCES BOULARDII 250 MG PO CAPS: 250.0000 mg | ORAL_CAPSULE | Freq: Two times a day (BID) | ORAL | 1 refills | Status: DC

## 2019-04-10 NOTE — Plan of Care (Signed)
  Problem: Acute Rehab PT Goals(only PT should resolve) Goal: Pt Will Go Supine/Side To Sit Outcome: Progressing Flowsheets (Taken 04/10/2019 1402) Pt will go Supine/Side to Sit: Independently Goal: Patient Will Transfer Sit To/From Stand Outcome: Progressing Flowsheets (Taken 04/10/2019 1402) Patient will transfer sit to/from stand: with modified independence Goal: Pt Will Transfer Bed To Chair/Chair To Bed Outcome: Progressing Flowsheets (Taken 04/10/2019 1402) Pt will Transfer Bed to Chair/Chair to Bed: with modified independence Goal: Pt Will Ambulate Outcome: Progressing Flowsheets (Taken 04/10/2019 1402) Pt will Ambulate:  > 125 feet  with modified independence   2:03 PM, 04/10/19 Lonell Grandchild, MPT Physical Therapist with [facility] [phone] office [phone] mobile phone

## 2019-04-10 NOTE — Discharge Instructions (Signed)
1) stop Voltaren/diclofenac due to increased risk for stomach ulcers and kidney problems-- Avoid ibuprofen/Advil/Aleve/Motrin/Goody Powders/Naproxen/BC powders/Meloxicam/Diclofenac/Indomethacin and other Nonsteroidal anti-inflammatory medications as these will make you more likely to bleed and can cause stomach ulcers, can also cause Kidney problems.   2) please note that there has been different/multiple changes to your medications----stop amlodipine, take Cardizem CD 120 mg daily instead  3) you were in atrial fibrillation for a brief period,  Please talk to your primary care physician a bit more about the risk versus benefit of taking the blood thinner given your history of atrial fibrillation to prevent strokes

## 2019-04-10 NOTE — Discharge Summary (Signed)
Terry [name], is a 59 y.o. male  DOB 1960-11-28  MRN [id number].  Admission date:  04/04/2019  Admitting Physician  Oswald Hillock, MD  Discharge Date:  04/10/2019   Primary MD  Kathyrn Drown, MD  Recommendations for primary care physician for things to follow:   1)Stop Voltaren/Diclofenac due to increased risk for stomach ulcers and kidney problems-- Avoid ibuprofen/Advil/Aleve/Motrin/Goody Powders/Naproxen/BC powders/Meloxicam/Diclofenac/Indomethacin and other Nonsteroidal anti-inflammatory medications as these will make you more likely to bleed and can cause stomach ulcers, can also cause Kidney problems.   2) please note that there has been different/multiple changes to your medications----stop amlodipine, take Cardizem CD 120 mg daily instead  3) you were in atrial fibrillation for a brief period,  Please talk to your primary care physician a bit more about the risk versus benefit of taking the blood thinner given your history of atrial fibrillation to prevent strokes  Admission Diagnosis  Altered mental status [R41.82] SOB (shortness of breath) [R06.02] Hyperglycemia [R73.9] Altered mental status, unspecified altered mental status type [R41.82] COVID-19 [U07.1]   Discharge Diagnosis  Altered mental status [R41.82] SOB (shortness of breath) [R06.02] Hyperglycemia [R73.9] Altered mental status, unspecified altered mental status type [R41.82] COVID-19 [U07.1]    Principal Problem:   Acute respiratory disease due to COVID-19 virus Active Problems:   DM type 2 without retinopathy ([place])   Essential hypertension, benign   Altered mental status   Transient atrial fibrillation -in the setting of COVID-19 infection and DKA   Acute metabolic encephalopathy      Past Medical History:  Diagnosis Date   Diabetes mellitus without complication ([place]) [id number]   Hyperlipidemia    Hypertension     Osteoarthritis     Past Surgical History:  Procedure Laterality Date   ANKLE SURGERY Left 2007   2 plates/screws   COLONOSCOPY N/A 08/04/2017   Procedure: COLONOSCOPY;  Surgeon: Rogene Houston, MD;  [facility];  Service: Endoscopy;  Laterality: N/A;   ESOPHAGEAL DILATION N/A 08/04/2017   Procedure: ESOPHAGEAL DILATION;  Surgeon: Rogene Houston, MD;  [facility];  Service: Endoscopy;  Laterality: N/A;   ESOPHAGOGASTRODUODENOSCOPY N/A 08/04/2017   Procedure: ESOPHAGOGASTRODUODENOSCOPY (EGD);  Surgeon: Rogene Houston, MD;  [facility];  Service: Endoscopy;  Laterality: N/A;   POLYPECTOMY  08/04/2017   Procedure: POLYPECTOMY;  Surgeon: Rogene Houston, MD;  [facility];  Service: Endoscopy;;       HPI  from the history and physical done on the day of admission:    Terry [name]  is a 59 y.o. male, with history of diabetes mellitus type 2, hypertension, hyperlipidemia who initially presented to ED with complaints of shortness of breath, generalized weakness, body aches and fatigue.  Symptoms have been present since 1 week and have been gradually worsening.  Patient's wife and son are also sick with similar symptoms and red recent tested positive for COVID-19. SARS COVID-19 antigen test was positive in the ED.  Initially patient was alert,  oriented x3 and later became combative, pulling at IV site.  Ativan was ordered, later required Geodon.  Patient was found to be in DKA and started on IV insulin.  As his mental status did not improve she was also started on Precedex infusion. Throughout this time patient's O2 sats were above 95% on room air. Chest x-ray showed no acute disease. UA was clear, UDS showed positive benzodiazepines.   Hospital Course:   Brief Narrative:  59 y.o.male,with history of diabetes mellitus type 2, hypertension, hyperlipidemia who initially presented to ED with complaints of shortness of breath, generalized  weakness, body aches and fatigue. Symptoms have been present since 1 week and have been gradually worsening. Patient's wife and son are also sick with similar symptoms and red recent tested positive for COVID-19. SARS COVID-19 antigen test was positive in the ED.  Initially patient was alert, oriented x3 and later became combative, pulling at IV site. Ativan was ordered, later required Geodon. Patient was found to be in DKA and started on IV insulin. As his mental status did not improve she was also started on Precedex infusion. Throughout this time patient's O2 sats were above 95% on room air. Chest x-ray showed no acute disease. UA was clear, UDS showed positive benzodiazepines.  Assessment & Plan: 1-Altered mental status/acute metabolic encephalopathy -in the setting of COVID-19 infection and DKA --- Initially required medication and physical restraints, -Psychosis/confusion/encephalopathy has resolved -Patient cognitively and from memory standpoint appears to be back to baseline  2-COVID-19 infection -Repeat chest x-ray from 04/06/2019 has remained unremarkable with no acute cardiopulmonary process. -not requiring O2 supplementation (O2 sat in high 90's on RA) -continue zinc and vit C -no meeting criteria for steroids or remdesivir currently; continue supportive care. -continue PRN antipyretics  -We used Lomotil and Florastor for associated diarrhea. -Patient denies abdominal pain, nausea/vomiting  3-DKA -DKA pathophysiology resolved. -recent A1C 8.4 (Nov 2020) -Home insulin regimen adjusted, continue factors  4--transient A. fib with RVR -In the setting most likely From Covid and DKA -No prior history of A. fib ; CHADSVASC score 2 (HTN and DM)  -Patient back in sinus rhythm, Cardizem was ordered  -Continue metoprolol also for rate control and BP control -PTA was on aspirin, continue same -Patient will discussed risk versus benefits of full anticoagulation with PCP  given very brief episode of A. fib triggered by presumed DKA and COVID-19 respiratory infection -TSH is 2.1, echo with EF of 65 to 70%  5-HTN Renal function is normalized okay to restart losartan -Okay to stop amlodipine, okay to use Cardizem CD 120 mg daily instead given transient A. Fib -Lopressor was ordered as well  6-anxiety/depression --- Mood and affect appears to be back to baseline, continue Zoloft  7-hypokalemia -continue repletion and follow trend  -Associated with GI losses and treatment of DKA (insulin drip). --Potassium replaced  8-acute on chronic renal failure (stage 2 at baseline) -Cr around 1.2 at baseline  -on admission peaked to 1.8 -Renal function improved significantly creatinine back to 0.9   Code Status: Full  Family Communication:  Called and spoke with wife Disposition Plan:  PT eval appreciated okay to discharge home Discharge Condition: Stable, cognitively back to baseline, physically back to baseline does not need home health PT  Follow UP--- virtual visit with PCP for reeval within a week   Diet and Activity recommendation:  As advised  Discharge Instructions    Discharge Instructions    Call MD for:  difficulty breathing, headache or visual disturbances  Complete by: As directed    Call MD for:  persistant dizziness or light-headedness   Complete by: As directed    Call MD for:  persistant nausea and vomiting   Complete by: As directed    Call MD for:  severe uncontrolled pain   Complete by: As directed    Call MD for:  temperature >100.4   Complete by: As directed    Diet - low sodium heart healthy   Complete by: As directed    Diet Carb Modified   Complete by: As directed    Discharge instructions   Complete by: As directed    1) stop Voltaren/diclofenac due to increased risk for stomach ulcers and kidney problems-- Avoid ibuprofen/Advil/Aleve/Motrin/Goody Powders/Naproxen/BC powders/Meloxicam/Diclofenac/Indomethacin and other  Nonsteroidal anti-inflammatory medications as these will make you more likely to bleed and can cause stomach ulcers, can also cause Kidney problems.   2) please note that there has been different/multiple changes to your medications  3) you were in atrial fibrillation for a brief period,  Please talk to your primary care physician a bit more about the risk versus benefit of taking the blood thinner given your history of atrial fibrillation to prevent strokes   Increase activity slowly   Complete by: As directed       Discharge Medications     Allergies as of 04/10/2019      Reactions   Altace [ramipril] Cough   Invokana [canagliflozin] Other (See Comments)   laryngitis   Metformin And Related Diarrhea   Novolog [insulin Aspart]    intolerance      Medication List    STOP taking these medications   amLODipine 10 MG tablet Commonly known as: NORVASC   BD Pen Needle Nano U/F 32G X 4 MM Misc Generic drug: Insulin Pen Needle   Contour Next Test test strip Generic drug: glucose blood   diclofenac 75 MG EC tablet Commonly known as: VOLTAREN     TAKE these medications   ALPRAZolam 0.5 MG tablet Commonly known as: Xanax Take 1 tablet (0.5 mg total) by mouth at bedtime as needed for anxiety or sleep.   ascorbic acid 500 MG tablet Commonly known as: VITAMIN C Take 1 tablet (500 mg total) by mouth daily. Start taking on: April 11, 2019   aspirin EC 81 MG tablet Take 1 tablet (81 mg total) by mouth daily with breakfast. What changed: when to take this   diltiazem 120 MG 24 hr capsule Commonly known as: Cardizem CD Take 1 capsule (120 mg total) by mouth daily.   indapamide 2.5 MG tablet Commonly known as: LOZOL TAKE ONE TABLET BY MOUTH ONCE DAILY.   insulin aspart 100 UNIT/ML injection Commonly known as: NovoLOG Inject 6 units twice daily. May titrate up to 14 units   Lantus SoloStar 100 UNIT/ML Solostar Pen Generic drug: Insulin Glargine Inject 40 Units into the  skin 2 (two) times daily. What changed: See the new instructions.   liraglutide 18 MG/3ML Sopn Commonly known as: Victoza INJECT 1.8MG  SUBCUTANEOUSLY ONCE DAILY.   losartan 100 MG tablet Commonly known as: COZAAR TAKE ONE TABLET BY MOUTH ONCE DAILY.   metoprolol succinate 50 MG 24 hr tablet Commonly known as: TOPROL-XL Take 1 tablet (50 mg total) by mouth 2 (two) times daily. Take with or immediately following a meal. What changed:   when to take this  additional instructions   mometasone 0.1 % cream Commonly known as: Elocon Apply 1 application topically 2 (two) times daily  as needed.   multivitamin tablet Take 1 tablet by mouth daily.   oxyCODONE-acetaminophen 10-325 MG tablet Commonly known as: PERCOCET 1 tablet 4 times daily as needed pain What changed:   how much to take  how to take this  when to take this  reasons to take this   pantoprazole 40 MG tablet Commonly known as: PROTONIX TAKE 1 TABLET BY MOUTH TWICE DAILY, BEFORE A MEAL. What changed: See the new instructions.   rosuvastatin 40 MG tablet Commonly known as: CRESTOR TAKE ONE TABLET BY MOUTH ONCE DAILY.   saccharomyces boulardii 250 MG capsule Commonly known as: FLORASTOR Take 1 capsule (250 mg total) by mouth 2 (two) times daily.   sertraline 50 MG tablet Commonly known as: ZOLOFT TAKE (1) TABLET BY MOUTH ONCE DAILY. What changed: See the new instructions.   sildenafil 20 MG tablet Commonly known as: REVATIO Take 1-5 tablets before sexual activity as directed. No greater that 5 tablets in a day.   zinc sulfate 220 (50 Zn) MG capsule Take 1 capsule (220 mg total) by mouth daily. Start taking on: April 11, 2019       Major procedures and Radiology Reports - PLEASE review detailed and final reports for all details, in brief -    DG CHEST PORT 1 VIEW  Result Date: 04/06/2019 CLINICAL DATA:  Shortness of breath. EXAM: PORTABLE CHEST 1 VIEW COMPARISON:  April 04, 2019 FINDINGS: The  study is limited due to patient positioning. The patient had difficulty cooperating. Taking positioning into account, the heart, hila, mediastinum, lungs, and pleura are unchanged and unremarkable. No pneumothorax. IMPRESSION: Limited study due to positioning as above. No acute infiltrate or abnormality noted. Electronically Signed   By: Dorise Bullion III M.D   On: 04/06/2019 10:57   DG Chest Port 1 View  Result Date: 04/04/2019 CLINICAL DATA:  Weakness shortness of breath EXAM: PORTABLE CHEST 1 VIEW COMPARISON:  None. FINDINGS: Low lung volumes. No focal airspace disease or effusion. Normal heart size. No pneumothorax IMPRESSION: No active disease.  Low lung volume Electronically Signed   By: Donavan Foil M.D.   On: 04/04/2019 18:38   ECHOCARDIOGRAM COMPLETE  Result Date: 04/08/2019   ECHOCARDIOGRAM REPORT   Patient Name:   PRISCILLA ACHEE Date of Exam: 04/07/2019 Medical Rec #:  [id number]      Height:       69.0 in Accession #:    [id number]     Weight:       187.6 lb Date of Birth:  Jan 09, 1961       BSA:          2.01 m Patient Age:    32 years       BP:           126/73 mmHg Patient Gender: M              HR:           84 bpm. Exam Location:  [facility] Procedure: 2D Echo Indications:    Atrial Fibrillation 427.31 / I48.91  History:        Patient has no prior history of Echocardiogram examinations.                 Risk Factors:Diabetes, Dyslipidemia, Former Smoker and                 Hypertension. GERD, COVID 19 positive.  Sonographer:    Leavy Cella RDCS (AE) Referring Phys: [place]  IMPRESSIONS  1. Left ventricular ejection fraction, by visual estimation, is 65 to 70%. The left ventricle has hyperdynamic function. There is no left ventricular hypertrophy.  2. Global right ventricle has normal systolic function.The right ventricular size is normal.  3. Left atrial size was normal.  4. Right atrial size was normal.  5. The mitral valve is normal in structure. No evidence of mitral valve  regurgitation.  6. The tricuspid valve is normal in structure.  7. The aortic valve has an indeterminant number of cusps. Aortic valve regurgitation is not visualized. Mild to moderate aortic valve sclerosis/calcification without any evidence of aortic stenosis.  8. The pulmonic valve was not well visualized. Pulmonic valve regurgitation is not visualized.  9. TR signal is inadequate for assessing pulmonary artery systolic pressure. FINDINGS  Left Ventricle: Left ventricular ejection fraction, by visual estimation, is 65 to 70%. The left ventricle has hyperdynamic function. The left ventricle has no regional wall motion abnormalities. There is no left ventricular hypertrophy. Left ventricular diastolic parameters were normal. Right Ventricle: The right ventricular size is mildly enlarged. No increase in right ventricular wall thickness. Global RV systolic function is has normal systolic function. Left Atrium: Left atrial size was normal in size. Right Atrium: Right atrial size was normal in size Pericardium: There is no evidence of pericardial effusion. Mitral Valve: The mitral valve is normal in structure. No evidence of mitral valve regurgitation. Tricuspid Valve: The tricuspid valve is normal in structure. Tricuspid valve regurgitation is not demonstrated. Aortic Valve: The aortic valve has an indeterminant number of cusps. Aortic valve regurgitation is not visualized. Mild to moderate aortic valve sclerosis/calcification is present, without any evidence of aortic stenosis. Pulmonic Valve: The pulmonic valve was not well visualized. Pulmonic valve regurgitation is not visualized. Pulmonic regurgitation is not visualized. Aorta: The aortic root is normal in size and structure. IAS/Shunts: The interatrial septum was not well visualized.  LEFT VENTRICLE PLAX 2D LVIDd:         4.12 cm  Diastology LVIDs:         2.54 cm  LV e' lateral:   5.87 cm/s LV PW:         1.06 cm  LV E/e' lateral: 11.0 LV IVS:        1.03 cm   LV e' medial:    4.24 cm/s LVOT diam:     2.00 cm  LV E/e' medial:  15.2 LV SV:         52 ml LV SV Index:   25.27 LVOT Area:     3.14 cm  RIGHT VENTRICLE RV S prime:     22.70 cm/s TAPSE (M-mode): 1.7 cm LEFT ATRIUM             Index       RIGHT ATRIUM           Index LA diam:        3.30 cm 1.64 cm/m  RA Area:     10.10 cm LA Vol (A2C):   35.0 ml 17.41 ml/m RA Volume:   17.50 ml  8.70 ml/m LA Vol (A4C):   55.5 ml 27.60 ml/m LA Biplane Vol: 48.4 ml 24.07 ml/m   AORTA Ao Root diam: 3.10 cm MITRAL VALVE MV Area (PHT): 3.42 cm             SHUNTS MV PHT:        64.38 msec           Systemic Diam: 2.00  cm MV Decel Time: 222 msec MV E velocity: 64.50 cm/s 103 cm/s MV A velocity: 66.50 cm/s 70.3 cm/s MV E/A ratio:  0.97       1.5  Oswaldo Milian MD Electronically signed by Oswaldo Milian MD Signature Date/Time: 04/08/2019/12:25:51 AM    Final     Micro Results     Recent Results (from the past 240 hour(s))  Blood culture (routine x 2)     Status: None   Collection Time: 04/04/19  5:28 PM   Specimen: BLOOD  Result Value Ref Range Status   Specimen Description BLOOD BOTTLES DRAWN AEROBIC ONLY  Final   Special Requests RIGHT ANTECUBITAL  Final   Culture   Final    NO GROWTH 6 DAYS Performed at [facility], [address], [place], [place] [address]    Report Status 04/10/2019 FINAL  Final  Blood culture (routine x 2)     Status: None   Collection Time: 04/04/19  8:26 PM   Specimen: BLOOD LEFT HAND  Result Value Ref Range Status   Specimen Description BLOOD LEFT HAND  Final   Special Requests   Final    BOTTLES DRAWN AEROBIC AND ANAEROBIC Blood Culture adequate volume   Culture   Final    NO GROWTH 6 DAYS Performed at [facility], [address] [place], [place] [address]    Report Status 04/10/2019 FINAL  Final  Urine culture     Status: None   Collection Time: 04/04/19 11:28 PM   Specimen: In/Out Cath Urine  Result Value Ref Range Status   Specimen Description   Final      IN/OUT CATH URINE Performed at [facility], [address], [place], [place] [address]    Special Requests   Final    NONE Performed at [facility], [address] [place], [place] [address]    Culture   Final    NO GROWTH Performed at [facility], [place] [address] [place], [place] [address]    Report Status 04/06/2019 FINAL  Final  MRSA PCR Screening     Status: None   Collection Time: 04/06/19  8:08 PM   Specimen: Nasal Mucosa; Nasopharyngeal  Result Value Ref Range Status   MRSA by PCR NEGATIVE NEGATIVE Final    Comment:        The GeneXpert MRSA Assay (FDA approved for NASAL specimens only), is one component of a comprehensive MRSA colonization surveillance program. It is not intended to diagnose MRSA infection nor to guide or monitor treatment for MRSA infections. Performed at [facility], [address], [place], [place] [address]   Culture, blood (routine x 2)     Status: None (Preliminary result)   Collection Time: 04/06/19  8:24 PM   Specimen: BLOOD RIGHT HAND  Result Value Ref Range Status   Specimen Description BLOOD RIGHT HAND  Final   Special Requests   Final    BOTTLES DRAWN AEROBIC AND ANAEROBIC Blood Culture adequate volume   Culture   Final    NO GROWTH 4 DAYS Performed at [facility], [address] [place], [place] [address]    Report Status PENDING  Incomplete  Culture, blood (routine x 2)     Status: None (Preliminary result)   Collection Time: 04/06/19  8:37 PM   Specimen: Right Antecubital; Blood  Result Value Ref Range Status   Specimen Description RIGHT ANTECUBITAL  Final   Special Requests   Final    BOTTLES DRAWN AEROBIC  AND ANAEROBIC Blood Culture adequate volume   Culture   Final    NO GROWTH 4 DAYS Performed at [facility], [address], [place], [place] [address]    Report Status PENDING  Incomplete       Today   Subjective    Brode Obenauer today has no new complaints -Ambulating without chest pain or dyspnea  on exertion or hypoxia post ambulation No fever  Or chills -Eager to go home          Patient has been seen and examined prior to discharge   Objective   Blood pressure (!) 151/78, pulse 77, temperature 98.1 F (36.7 C), resp. rate 16, height 5\' 9"  (1.753 m), weight 85.8 kg, SpO2 97 %.   Intake/Output Summary (Last 24 hours) at 04/10/2019 1607 Last data filed at 04/10/2019 0645 Gross per 24 hour  Intake --  Output 1800 ml  Net -1800 ml    Exam Gen:- Awake Alert, no acute distress  HEENT:- [place].AT, No sclera icterus Neck-Supple Neck,No JVD,.  Lungs-  CTAB , good air movement bilaterally  CV- S1, S2 normal, regular Abd-  +ve B.Sounds, Abd Soft, No tenderness,  Extremity/Skin:- No  edema,   good pulses Psych-affect is appropriate, oriented x3 Neuro-no new focal deficits, no tremors    Data Review   CBC w Diff:  Lab Results  Component Value Date   WBC 10.1 04/10/2019   HGB 13.1 04/10/2019   HCT 36.8 (L) 04/10/2019   PLT 260 04/10/2019    CMP:  Lab Results  Component Value Date   NA 138 04/10/2019   NA 140 10/29/2018   K 3.2 (L) 04/10/2019   CL 107 04/10/2019   CO2 21 (L) 04/10/2019   BUN 21 (H) 04/10/2019   BUN 21 10/29/2018   CREATININE 0.90 04/10/2019   CREATININE 0.91 02/25/2014   PROT 7.4 10/29/2018   ALBUMIN 4.6 10/29/2018   BILITOT 0.3 10/29/2018   ALKPHOS 51 10/29/2018   AST 27 10/29/2018   ALT 33 10/29/2018  .   Total Discharge time is about 33 minutes  Roxan Hockey M.D on 04/10/2019 at 4:07 PM  Go to [email or web address] -  for contact info  [facility] - Office  [phone]

## 2019-04-10 NOTE — Progress Notes (Signed)
Reviewed d/c instructions, follow up care, prescriptions reviewed. Pt states understanding and denies further questions at this time. IV's removed at this time. Discussed masking, social distancing, social isolation, and transmission of COVID 70. Pt wearing mask and assisted by WC to d/c.

## 2019-04-10 NOTE — Evaluation (Signed)
Physical Therapy Evaluation Patient Details Name: Terry [name] MRN: [id number] DOB: 17-Jun-1960 Today's Date: 04/10/2019   History of Present Illness  Terry [name]  is a 59 y.o. male, with history of diabetes mellitus type 2, hypertension, hyperlipidemia who initially presented to ED with complaints of shortness of breath, generalized weakness, body aches and fatigue.  Symptoms have been present since 1 week and have been gradually worsening.  Patient's wife and son are also sick with similar symptoms and red recent tested positive for COVID-19.SARS COVID-19 antigen test was positive in the ED.    Clinical Impression  Patient functioning near baseline for functional mobility and gait, slightly unsteady upon standing, but after a few minutes demonstrates good return for ambulating in room without loss of balance.  Patient tolerated sitting up in chair after therapy - RN aware.  Patient will benefit from continued physical therapy in hospital and recommended venue below to increase strength, balance, endurance for safe ADLs and gait.      Follow Up Recommendations No PT follow up;Supervision - Intermittent    Equipment Recommendations  None recommended by PT    Recommendations for Other Services       Precautions / Restrictions Precautions Precautions: None Restrictions Weight Bearing Restrictions: No      Mobility  Bed Mobility Overal bed mobility: Modified Independent             General bed mobility comments: increased time, used bed rails  Transfers Overall transfer level: Needs assistance Equipment used: None;1 person hand held assist Transfers: Sit to/from Stand;Stand Pivot Transfers Sit to Stand: Min guard Stand pivot transfers: Supervision       General transfer comment: slightly unsteady during sit to stands  Ambulation/Gait Ambulation/Gait assistance: Supervision Gait Distance (Feet): 60 Feet Assistive device: None;1 person hand held assist Gait  Pattern/deviations: Decreased step length - right;Decreased step length - left;Decreased stride length Gait velocity: decreased   General Gait Details: slightly unsteady cadence without loss of balance ambulating in room  Stairs            Wheelchair Mobility    Modified Rankin (Stroke Patients Only)       Balance Overall balance assessment: Mild deficits observed, not formally tested                                           Pertinent Vitals/Pain Pain Assessment: No/denies pain    Home Living Family/patient expects to be discharged to:: Private residence Living Arrangements: Spouse/significant other Available Help at Discharge: Family Type of Home: House Home Access: Stairs to enter Entrance Stairs-Rails: None Technical brewer of Steps: 4 Home Layout: One level Home Equipment: None      Prior Function Level of Independence: Independent         Comments: Hydrographic surveyor, drives     Journalist, newspaper        Extremity/Trunk Assessment   Upper Extremity Assessment Upper Extremity Assessment: Overall WFL for tasks assessed    Lower Extremity Assessment Lower Extremity Assessment: Generalized weakness    Cervical / Trunk Assessment Cervical / Trunk Assessment: Normal  Communication   Communication: No difficulties  Cognition Arousal/Alertness: Awake/alert Behavior During Therapy: WFL for tasks assessed/performed Overall Cognitive Status: Within Functional Limits for tasks assessed  General Comments      Exercises     Assessment/Plan    PT Assessment Patient needs continued PT services  PT Problem List Decreased strength;Decreased activity tolerance;Decreased balance;Decreased mobility       PT Treatment Interventions Balance training;Gait training;Stair training;Functional mobility training;Therapeutic activities;Therapeutic exercise;Patient/family education     PT Goals (Current goals can be found in the Care Plan section)  Acute Rehab PT Goals Patient Stated Goal: return home with family to assist PT Goal Formulation: With patient Time For Goal Achievement: 04/17/19 Potential to Achieve Goals: Good    Frequency Min 3X/week   Barriers to discharge        Co-evaluation               AM-PAC PT "6 Clicks" Mobility  Outcome Measure Help needed turning from your back to your side while in a flat bed without using bedrails?: None Help needed moving from lying on your back to sitting on the side of a flat bed without using bedrails?: A Little Help needed moving to and from a bed to a chair (including a wheelchair)?: None Help needed standing up from a chair using your arms (e.g., wheelchair or bedside chair)?: A Little Help needed to walk in hospital room?: A Little Help needed climbing 3-5 steps with a railing? : A Little 6 Click Score: 20    End of Session   Activity Tolerance: Patient tolerated treatment well Patient left: in chair;with call bell/phone within reach Nurse Communication: Mobility status PT Visit Diagnosis: Unsteadiness on feet (R26.81);Other abnormalities of gait and mobility (R26.89);Muscle weakness (generalized) (M62.81)    Time: [id number] PT Time Calculation (min) (ACUTE ONLY): 31 min   Charges:   PT Evaluation $PT Eval Moderate Complexity: 1 Mod PT Treatments $Therapeutic Activity: 23-37 mins        2:01 PM, 04/10/19 Terry [name], MPT Physical Therapist with [facility] [phone] office [phone] mobile phone

## 2019-04-11 ENCOUNTER — Telehealth: Payer: Self-pay | Admitting: Family Medicine

## 2019-04-11 LAB — CULTURE, BLOOD (ROUTINE X 2)
Culture: NO GROWTH
Culture: NO GROWTH
Special Requests: ADEQUATE
Special Requests: ADEQUATE

## 2019-04-11 NOTE — Telephone Encounter (Signed)
Left message to return call 

## 2019-04-11 NOTE — Telephone Encounter (Signed)
Pt wife returned call and verbalized understanding. Pt wife transferred up front to make appt for next Wednesday or Thursday in office. Pt wife will make sure pt bring medications and will check sugars close.

## 2019-04-11 NOTE — Telephone Encounter (Signed)
Nurse's-patient recently discharged from the hospital. Please call patient, let them know that we are aware that they were discharged from the hospital. Please schedule them to follow-up with [place] within the next 7 days. Advised the patient to bring all of their medications with him to the visit. Please inquire if they are having any acute issues currently and documented accordingly.  Patient in the hospital with diabetic ketoacidosis as well as Covid infection  Nurses please call the patient Make sure they do not have any current needs. Please set patient up for a follow-up office visit next Wednesday or Thursday Preferably at the end of the morning or late afternoon Patient should bring his medication with him when he comes I would recommend the patient stay out of work through at least that office visit Also the patient should follow his sugars closely

## 2019-04-12 ENCOUNTER — Other Ambulatory Visit: Payer: Self-pay | Admitting: Family Medicine

## 2019-04-15 ENCOUNTER — Telehealth: Payer: Self-pay | Admitting: Family Medicine

## 2019-04-15 ENCOUNTER — Other Ambulatory Visit: Payer: Self-pay | Admitting: Family Medicine

## 2019-04-15 NOTE — Telephone Encounter (Signed)
Patient notified

## 2019-04-15 NOTE — Telephone Encounter (Signed)
Wife(DPR) notified 

## 2019-04-15 NOTE — Telephone Encounter (Signed)
Patient needs new prescription for Elcon cream for bed sores called into [facility]

## 2019-04-15 NOTE — Telephone Encounter (Signed)
Best treatment for bedsores is frequent repositioning Elocon cream 0.1% applied twice daily as needed If ongoing troubles or problems notify us 45 g with no refill

## 2019-04-15 NOTE — Telephone Encounter (Signed)
Prescription called into [facility]. Left message to return call to notify patient.

## 2019-04-15 NOTE — Telephone Encounter (Signed)
Patient is requesting prescription for Dukes mouth wash to be called into [facility]

## 2019-04-15 NOTE — Telephone Encounter (Signed)
Left message to return call 

## 2019-04-15 NOTE — Telephone Encounter (Signed)
Dukes Magic mouthwash 1 teaspoon swish and swallow or spit 4 times daily for up to 7 days 150 mL with 1 refill

## 2019-04-18 ENCOUNTER — Other Ambulatory Visit: Payer: Self-pay

## 2019-04-18 ENCOUNTER — Ambulatory Visit (INDEPENDENT_AMBULATORY_CARE_PROVIDER_SITE_OTHER): Payer: BC Managed Care – PPO | Admitting: Family Medicine

## 2019-04-18 ENCOUNTER — Encounter: Payer: Self-pay | Admitting: Family Medicine

## 2019-04-18 VITALS — BP 128/82 | Temp 97.7°F | Ht 69.0 in | Wt 178.8 lb

## 2019-04-18 DIAGNOSIS — R5383 Other fatigue: Secondary | ICD-10-CM

## 2019-04-18 DIAGNOSIS — I4891 Unspecified atrial fibrillation: Secondary | ICD-10-CM

## 2019-04-18 DIAGNOSIS — J069 Acute upper respiratory infection, unspecified: Secondary | ICD-10-CM

## 2019-04-18 DIAGNOSIS — R809 Proteinuria, unspecified: Secondary | ICD-10-CM

## 2019-04-18 DIAGNOSIS — Z79899 Other long term (current) drug therapy: Secondary | ICD-10-CM | POA: Diagnosis not present

## 2019-04-18 DIAGNOSIS — E1129 Type 2 diabetes mellitus with other diabetic kidney complication: Secondary | ICD-10-CM

## 2019-04-18 DIAGNOSIS — U071 COVID-19: Secondary | ICD-10-CM

## 2019-04-18 MED ORDER — TRIAMCINOLONE ACETONIDE 0.1 % EX CREA: TOPICAL_CREAM | CUTANEOUS | 0 refills | Status: DC

## 2019-04-18 NOTE — Progress Notes (Signed)
Subjective:    Patient ID: Terry [name], male    DOB: 08-29-1960, 59 y.o.   MRN: [id number]  HPIhospital follow up. Pt states he still feels weak and tired. States his tongue is raw and he is using dukes magic mouth wash 4 -5 times a day and it is not helping. Hard to eat with his mouth being raw.   High risk medication use - Plan: Basic metabolic panel, CBC with Differential/Platelet, Hepatic function panel  Other fatigue - Plan: Basic metabolic panel, CBC with Differential/Platelet, Hepatic function panel  Atrial fibrillation, unspecified type ([place]) - Plan: Ambulatory referral to Cardiology  Transient atrial fibrillation -in the setting of COVID-19 infection and DKA  Acute respiratory disease due to COVID-19 virus  Diabetes mellitus with proteinuria ([place])  Time was spent today going over all the hospitalization testing plus also time was spent going over labs as well as his medications and discussing how to get his diabetes under good control currently glucose readings are significantly elevated and shows that its not under good control  He did have intermittent atrial fibrillation while in the hospital for short run has not had any problems since then but nonetheless it is concerning we will need to look into this further  He did have Covid but he denies any respiratory distress from this currently  He is still suffering with severe fatigue tiredness as well as difficulty thinking and processing related to the DKA as well as Covid unable to work currently  Review of Systems  Constitutional: Positive for fatigue. Negative for diaphoresis.  HENT: Negative for congestion and rhinorrhea.   Respiratory: Negative for cough and shortness of breath.   Cardiovascular: Negative for chest pain and leg swelling.  Gastrointestinal: Negative for abdominal pain and diarrhea.  Musculoskeletal: Positive for arthralgias and back pain.  Skin: Negative for color change and rash.  Neurological:  Negative for dizziness and headaches.  Psychiatric/Behavioral: Negative for behavioral problems and confusion.       Objective:   Physical Exam Vitals reviewed.  Constitutional:      General: He is not in acute distress. HENT:     Head: Normocephalic and atraumatic.  Eyes:     General:        Right eye: No discharge.        Left eye: No discharge.  Neck:     Trachea: No tracheal deviation.  Cardiovascular:     Rate and Rhythm: Normal rate and regular rhythm.     Heart sounds: Normal heart sounds. No murmur.  Pulmonary:     Effort: Pulmonary effort is normal. No respiratory distress.     Breath sounds: Normal breath sounds.  Lymphadenopathy:     Cervical: No cervical adenopathy.  Skin:    General: Skin is warm and dry.  Neurological:     Mental Status: He is alert.     Coordination: Coordination normal.  Psychiatric:        Behavior: Behavior normal.           Assessment & Plan:  Post Covid infection Patient still having difficulty with thinking and processing.  Also having extreme fatigue.  This could take several weeks to get better.  I have recommended for this patient to stay out of work at least through February 7 he will recheck with [place] in 2 weeks on February 4  Diabetes subpar control gradually increase long-acting insulin in order to get his numbers under better control also sliding scale with the  short acting insulin also encourage the patient to divide what he is eating over several meals per day  Patient will do lab work to follow-up regarding CBC liver enzymes as well as kidney functions  Continue current measures in regards of blood pressure  Continue antidepressant: I believe that helps some as well  As for his pain medicine I recommend that he try to get by on less pain medicine if possible  Intermittent atrial fibrillation this happened while he was in the hospital with severe Covid as well as DKA It is best for the patient to be seen by  cardiology more than likely will need to have telemetry hopefully this is a transient issue does not need to be on anticoagulants currently Should he have further trouble to immediately connect with [place]

## 2019-04-19 ENCOUNTER — Encounter: Payer: Self-pay | Admitting: Family Medicine

## 2019-04-22 ENCOUNTER — Ambulatory Visit: Payer: BC Managed Care – PPO | Admitting: Family Medicine

## 2019-04-25 DIAGNOSIS — R5383 Other fatigue: Secondary | ICD-10-CM | POA: Diagnosis not present

## 2019-04-25 DIAGNOSIS — Z79899 Other long term (current) drug therapy: Secondary | ICD-10-CM | POA: Diagnosis not present

## 2019-04-26 LAB — CBC WITH DIFFERENTIAL/PLATELET
Basophils Absolute: 0.1 10*3/uL (ref 0.0–0.2)
Basos: 1 %
EOS (ABSOLUTE): 0.1 10*3/uL (ref 0.0–0.4)
Eos: 1 %
Hematocrit: 33.8 % — ABNORMAL LOW (ref 37.5–51.0)
Hemoglobin: 11.8 g/dL — ABNORMAL LOW (ref 13.0–17.7)
Immature Grans (Abs): 0 10*3/uL (ref 0.0–0.1)
Immature Granulocytes: 0 %
Lymphocytes Absolute: 2.8 10*3/uL (ref 0.7–3.1)
Lymphs: 36 %
MCH: 31.2 pg (ref 26.6–33.0)
MCHC: 34.9 g/dL (ref 31.5–35.7)
MCV: 89 fL (ref 79–97)
Monocytes Absolute: 0.7 10*3/uL (ref 0.1–0.9)
Monocytes: 9 %
Neutrophils Absolute: 4.1 10*3/uL (ref 1.4–7.0)
Neutrophils: 53 %
Platelets: 159 10*3/uL (ref 150–450)
RBC: 3.78 x10E6/uL — ABNORMAL LOW (ref 4.14–5.80)
RDW: 13.2 % (ref 11.6–15.4)
WBC: 7.7 10*3/uL (ref 3.4–10.8)

## 2019-04-26 LAB — BASIC METABOLIC PANEL
BUN/Creatinine Ratio: 17 (ref 9–20)
BUN: 21 mg/dL (ref 6–24)
CO2: 30 mmol/L — ABNORMAL HIGH (ref 20–29)
Calcium: 9.6 mg/dL (ref 8.7–10.2)
Chloride: 95 mmol/L — ABNORMAL LOW (ref 96–106)
Creatinine, Ser: 1.22 mg/dL (ref 0.76–1.27)
GFR calc Af Amer: 76 mL/min/{1.73_m2} (ref >59)
GFR calc non Af Amer: 65 mL/min/{1.73_m2} (ref >59)
Glucose: 169 mg/dL — ABNORMAL HIGH (ref 65–99)
Potassium: 3.9 mmol/L (ref 3.5–5.2)
Sodium: 142 mmol/L (ref 134–144)

## 2019-04-26 LAB — HEPATIC FUNCTION PANEL
ALT: 33 IU/L (ref 0–44)
AST: 28 IU/L (ref 0–40)
Albumin: 4 g/dL (ref 3.8–4.9)
Alkaline Phosphatase: 52 IU/L (ref 39–117)
Bilirubin Total: 0.3 mg/dL (ref 0.0–1.2)
Bilirubin, Direct: 0.12 mg/dL (ref 0.00–0.40)
Total Protein: 6.6 g/dL (ref 6.0–8.5)

## 2019-05-03 ENCOUNTER — Ambulatory Visit (INDEPENDENT_AMBULATORY_CARE_PROVIDER_SITE_OTHER): Payer: BC Managed Care – PPO | Admitting: Family Medicine

## 2019-05-03 ENCOUNTER — Other Ambulatory Visit: Payer: Self-pay

## 2019-05-03 DIAGNOSIS — I10 Essential (primary) hypertension: Secondary | ICD-10-CM

## 2019-05-03 DIAGNOSIS — Z8616 Personal history of COVID-19: Secondary | ICD-10-CM

## 2019-05-03 DIAGNOSIS — E7849 Other hyperlipidemia: Secondary | ICD-10-CM

## 2019-05-03 DIAGNOSIS — E1129 Type 2 diabetes mellitus with other diabetic kidney complication: Secondary | ICD-10-CM | POA: Diagnosis not present

## 2019-05-03 DIAGNOSIS — R809 Proteinuria, unspecified: Secondary | ICD-10-CM

## 2019-05-03 NOTE — Progress Notes (Signed)
   Subjective:    Patient ID: Terry [name], male    DOB: October 03, 1960, 59 y.o.   MRN: [id number]  HPI Pt here for 2 week follow up. Pt states he is still tired and weak. No vomiting.  Virtual Visit via Telephone Note  I connected with Terry [name] on 05/03/19 at  8:30 AM EST by telephone and verified that I am speaking with the correct person using two identifiers.  Location: Patient: home Provider: office   I discussed the limitations, risks, security and privacy concerns of performing an evaluation and management service by telephone and the availability of in person appointments. I also discussed with the patient that there may be a patient responsible charge related to this service. The patient expressed understanding and agreed to proceed.   History of Present Illness:    Observations/Objective:   Assessment and Plan:   Follow Up Instructions:    I discussed the assessment and treatment plan with the patient. The patient was provided an opportunity to ask questions and all were answered. The patient agreed with the plan and demonstrated an understanding of the instructions.   The patient was advised to call back or seek an in-person evaluation if the symptoms worsen or if the condition fails to improve as anticipated.  I provided 20 minutes of non-face-to-face time during this encounter.  Please see other documentation thank you    Review of Systems     Objective:   Physical Exam        Assessment & Plan:

## 2019-05-03 NOTE — Progress Notes (Signed)
   Subjective:    Patient ID: Terry [name], male    DOB: 09-13-1960, 59 y.o.   MRN: [id number] Virtual Visit via Video Note  I connected with Terry [name] on 05/04/19 at  8:30 AM EST by a video enabled telemedicine application and verified that I am speaking with the correct person using two identifiers.  Location: Patient: Home Provider: Office   I discussed the limitations of evaluation and management by telemedicine and the availability of in person appointments. The patient expressed understanding and agreed to proceed.  History of Present Illness:    Observations/Objective:   Assessment and Plan:   Follow Up Instructions:    I discussed the assessment and treatment plan with the patient. The patient was provided an opportunity to ask questions and all were answered. The patient agreed with the plan and demonstrated an understanding of the instructions.   The patient was advised to call back or seek an in-person evaluation if the symptoms worsen or if the condition fails to improve as anticipated.  I provided 20 minutes of non-face-to-face time during this encounter.   Sallee Lange, MD   HPI Recently had serious Covid infection as well as DKA.  Patient having difficult time with recovering and having a lot of fatigue tiredness his sugars were way out of control but now they are under better control the patient does relate he is having a hard time processing things as quickly as possible he also relates he still has some weakness in his hands and his dexterity is not quite as good   Review of Systems  Constitutional: Positive for fatigue. Negative for diaphoresis.  HENT: Negative for congestion and rhinorrhea.   Respiratory: Negative for cough and shortness of breath.   Cardiovascular: Negative for chest pain and leg swelling.  Gastrointestinal: Negative for abdominal pain and diarrhea.  Skin: Negative for color change and rash.  Neurological: Negative for dizziness  and headaches.  Psychiatric/Behavioral: Negative for behavioral problems and confusion.       Objective:   Physical Exam  Today's visit was via telephone Physical exam was not possible for this visit       Assessment & Plan:  Diabetes-initially has numbers were not under good control but he states recently they have been under good control we will continue to monitor closely we will relook at A1c again in 3 months  Post Covid-patient still having some cognitive issues states that it is more difficult to think things through but he is able to function feels he is able to drive.  He does have significant fatigue and tiredness.  I do not believe that he is ready to go back to work on a full-time no restriction basis.  I have recommended to the patient that when he goes back to work initially be 8-hour days and also work with a coworker to lessen the chance of having any significant setbacks  Patient will keep all his regular visits for pain management and diabetes

## 2019-05-07 ENCOUNTER — Telehealth: Payer: Self-pay | Admitting: Family Medicine

## 2019-05-07 NOTE — Telephone Encounter (Signed)
FYI- Patient states didn't need work note has returned to work .

## 2019-05-07 NOTE — Telephone Encounter (Signed)
-----   Message from Kathyrn Drown, MD sent at 05/04/2019  8:14 AM EST ----- Front-I was under the impression the patient was going to let us know if he needs a note to return to work and if so does he want the note to state light duty no more than 8 hours/day?  The patient was supposed to call back Friday afternoon?  I do not think he ever get.  Verify with patient what day he is planning to return to work then go ahead and send him a note accordingly if you need further input from me please let me know thank you

## 2019-05-18 ENCOUNTER — Other Ambulatory Visit: Payer: Self-pay | Admitting: Family Medicine

## 2019-05-20 ENCOUNTER — Other Ambulatory Visit: Payer: Self-pay | Admitting: Family Medicine

## 2019-05-20 ENCOUNTER — Telehealth: Payer: Self-pay | Admitting: Family Medicine

## 2019-05-20 MED ORDER — OXYCODONE-ACETAMINOPHEN 10-325 MG PO TABS: ORAL_TABLET | ORAL | 0 refills | Status: DC

## 2019-05-20 MED ORDER — INSULIN ASPART 100 UNIT/ML ~~LOC~~ SOLN: SUBCUTANEOUS | 5 refills | Status: DC

## 2019-05-20 NOTE — Telephone Encounter (Signed)
1.  Please adjust NovoLog to set state 30 units with meals may titrate up to 35 units with meals may send them month supply with 5 refills If patient prefers 90-day that would be fine as well with 1 refill  As for the pain medicine please go ahead and do 2 refills on the pain medicine 1 for May 20, 2019 the other 1 from June 18, 2019 Please pend RX and send it back thanks

## 2019-05-20 NOTE — Telephone Encounter (Signed)
I signed off on the prescriptions as requested thank you

## 2019-05-20 NOTE — Telephone Encounter (Signed)
Medication pended. Pt is aware

## 2019-05-20 NOTE — Telephone Encounter (Signed)
Pt states he is using 30 units tid with meals on the novolog. Also see note below about refill on oxycodone.

## 2019-05-20 NOTE — Telephone Encounter (Signed)
Patient is requesting refill on oxycodone 10/325 mg called into [facility].

## 2019-05-20 NOTE — Telephone Encounter (Signed)
Last pain management 01/23/19

## 2019-05-20 NOTE — Telephone Encounter (Signed)
Refill on Novolog flexpen and he need titrate up because patient states he is using more and will be out before 2/28.

## 2019-05-21 ENCOUNTER — Telehealth: Payer: Self-pay

## 2019-05-21 ENCOUNTER — Encounter: Payer: Self-pay | Admitting: Cardiology

## 2019-05-21 ENCOUNTER — Ambulatory Visit: Payer: BC Managed Care – PPO | Admitting: Cardiology

## 2019-05-21 VITALS — BP 134/73 | HR 94 | Temp 99.4°F | Ht 70.0 in | Wt 202.0 lb

## 2019-05-21 DIAGNOSIS — I4891 Unspecified atrial fibrillation: Secondary | ICD-10-CM | POA: Diagnosis not present

## 2019-05-21 NOTE — Progress Notes (Signed)
Clinical Summary Mr. Tefft is a 59 y.o.male seen today as a new consult, referred by Dr Wolfgang Phoenix for the following medical problems.    1. Afib - transient episode in Jan 2021 during admission with COVID and DKA. Notes mention issues with hypokalemia that admission as well - EKGs reviewed, on Apr 07, 2019 was in afib - no palpitations at all - CHADS2Vasc score is 2 (HTN, DM2)  2. Prior COVID infection - admitted Jan 2021 with COVID, complicated by DKA - Past Medical History:  Diagnosis Date  . Diabetes mellitus without complication ([facility]) 123XX123  . Hyperlipidemia   . Hypertension   . Osteoarthritis      Allergies  Allergen Reactions  . Altace [Ramipril] Cough  . Invokana [Canagliflozin] Other (See Comments)    laryngitis  . Metformin And Related Diarrhea  . Novolog [Insulin Aspart]     intolerance     Current Outpatient Medications  Medication Sig Dispense Refill  . ALPRAZolam (XANAX) 0.5 MG tablet Take 1 tablet (0.5 mg total) by mouth at bedtime as needed for anxiety or sleep. 8 tablet 0  . ascorbic acid (VITAMIN C) 500 MG tablet Take 1 tablet (500 mg total) by mouth daily. 30 tablet 2  . aspirin EC 81 MG tablet Take 1 tablet (81 mg total) by mouth daily with breakfast. 120 tablet 3  . diltiazem (CARDIZEM CD) 120 MG 24 hr capsule Take 1 capsule (120 mg total) by mouth daily. 30 capsule 3  . indapamide (LOZOL) 2.5 MG tablet TAKE ONE TABLET BY MOUTH ONCE DAILY. 90 tablet 0  . insulin aspart (NOVOLOG) 100 UNIT/ML injection Inject 30 units with meals; may titrate up to 35 units with meals 10 mL 5  . Insulin Glargine (LANTUS SOLOSTAR) 100 UNIT/ML Solostar Pen Inject 40 Units into the skin 2 (two) times daily. 60 mL 0  . liraglutide (VICTOZA) 18 MG/3ML SOPN INJECT 1.8MG  SUBCUTANEOUSLY ONCE DAILY. 9 mL 12  . losartan (COZAAR) 100 MG tablet TAKE ONE TABLET BY MOUTH ONCE DAILY. 90 tablet 0  . metoprolol succinate (TOPROL-XL) 50 MG 24 hr tablet Take 1 tablet (50 mg total) by  mouth 2 (two) times daily. Take with or immediately following a meal. 60 tablet 3  . mometasone (ELOCON) 0.1 % cream APPLY TO AFFECTED AREAS TWICE DAILY AS NEEDED. 45 g 2  . Multiple Vitamin (MULTIVITAMIN) tablet Take 1 tablet by mouth daily.    Marland Kitchen NOVOLOG FLEXPEN 100 UNIT/ML FlexPen INJECT 6 UNITS TWICE DAILY. MAY TITRATE UP TO 14 UNITS. 15 mL 0  . oxyCODONE-acetaminophen (PERCOCET) 10-325 MG tablet 1 tablet 4 times daily as needed pain 120 tablet 0  . oxyCODONE-acetaminophen (PERCOCET) 10-325 MG tablet 1 tablet 4 times daily as needed pain 120 tablet 0  . pantoprazole (PROTONIX) 40 MG tablet TAKE 1 TABLET BY MOUTH TWICE DAILY, BEFORE A MEAL. (Patient taking differently: Take 40 mg by mouth 2 (two) times daily. TAKE 1 TABLET BY MOUTH TWICE DAILY BEFORE A MEAL.) 180 tablet 1  . rosuvastatin (CRESTOR) 40 MG tablet TAKE ONE TABLET BY MOUTH ONCE DAILY. 90 tablet 1  . saccharomyces boulardii (FLORASTOR) 250 MG capsule Take 1 capsule (250 mg total) by mouth 2 (two) times daily. 30 capsule 1  . sertraline (ZOLOFT) 50 MG tablet TAKE (1) TABLET BY MOUTH ONCE DAILY. (Patient taking differently: Take 50 mg by mouth daily. TAKE (1) TABLET BY MOUTH ONCE DAILY.) 90 tablet 0  . sildenafil (REVATIO) 20 MG tablet Take 1-5 tablets  before sexual activity as directed. No greater that 5 tablets in a day. 25 tablet 6  . triamcinolone cream (KENALOG) 0.1 % USE SMALL DABS TID 30 g 0  . zinc sulfate 220 (50 Zn) MG capsule Take 1 capsule (220 mg total) by mouth daily. 30 capsule 1   No current facility-administered medications for this visit.     Past Surgical History:  Procedure Laterality Date  . ANKLE SURGERY Left 2007   2 plates/screws  . COLONOSCOPY N/A 08/04/2017   Procedure: COLONOSCOPY;  Surgeon: Rogene Houston, MD;  [facility];  Service: Endoscopy;  Laterality: N/A;  . ESOPHAGEAL DILATION N/A 08/04/2017   Procedure: ESOPHAGEAL DILATION;  Surgeon: Rogene Houston, MD;  [facility];   Service: Endoscopy;  Laterality: N/A;  . ESOPHAGOGASTRODUODENOSCOPY N/A 08/04/2017   Procedure: ESOPHAGOGASTRODUODENOSCOPY (EGD);  Surgeon: Rogene Houston, MD;  [facility];  Service: Endoscopy;  Laterality: N/A;  . POLYPECTOMY  08/04/2017   Procedure: POLYPECTOMY;  Surgeon: Rogene Houston, MD;  [facility];  Service: Endoscopy;;     Allergies  Allergen Reactions  . Altace [Ramipril] Cough  . Invokana [Canagliflozin] Other (See Comments)    laryngitis  . Metformin And Related Diarrhea  . Novolog [Insulin Aspart]     intolerance      Family History  Problem Relation Age of Onset  . Hypertension Mother   . Diabetes Mother   . Heart disease Mother   . Hyperlipidemia Mother   . Cancer Father        lung     Social History Mr. Garverick reports that he quit smoking about 26 years ago. He has never used smokeless tobacco. Mr. Streight reports no history of alcohol use.   Review of Systems CONSTITUTIONAL: No weight loss, fever, chills, weakness or fatigue.  HEENT: Eyes: No visual loss, blurred vision, double vision or yellow sclerae.No hearing loss, sneezing, congestion, runny nose or sore throat.  SKIN: No rash or itching.  CARDIOVASCULAR: per hpi RESPIRATORY: No shortness of breath, cough or sputum.  GASTROINTESTINAL: No anorexia, nausea, vomiting or diarrhea. No abdominal pain or blood.  GENITOURINARY: No burning on urination, no polyuria NEUROLOGICAL: No headache, dizziness, syncope, paralysis, ataxia, numbness or tingling in the extremities. No change in bowel or bladder control.  MUSCULOSKELETAL: No muscle, back pain, joint pain or stiffness.  LYMPHATICS: No enlarged nodes. No history of splenectomy.  PSYCHIATRIC: No history of depression or anxiety.  ENDOCRINOLOGIC: No reports of sweating, cold or heat intolerance. No polyuria or polydipsia.  Marland Kitchen   Physical Examination Today's Vitals   05/21/19 0848  BP: 134/73  Pulse: 94  Temp: 99.4 F (37.4  C)  SpO2: 96%  Weight: 202 lb (91.6 kg)  Height: 5\' 10"  (1.778 m)   Body mass index is 28.98 kg/m.  Gen: resting comfortably, no acute distress HEENT: no scleral icterus, pupils equal round and reactive, no palptable cervical adenopathy,  CV: RRR, 2/6 systolic murmur rusb, no jvd Resp: Clear to auscultation bilaterally GI: abdomen is soft, non-tender, non-distended, normal bowel sounds, no hepatosplenomegaly MSK: extremities are warm, no edema.  Skin: warm, no rash Neuro:  no focal deficits Psych: appropriate affect   Diagnostic Studies  Jan 2021 echo 1. Left ventricular ejection fraction, by visual estimation, is 65 to  70%. The left ventricle has hyperdynamic function. There is no left  ventricular hypertrophy.  2. Global right ventricle has normal systolic function.The right  ventricular size is normal.  3. Left atrial size was normal.  4. Right atrial size was normal.  5. The mitral valve is normal in structure. No evidence of mitral valve  regurgitation.  6. The tricuspid valve is normal in structure.  7. The aortic valve has an indeterminant number of cusps. Aortic valve  regurgitation is not visualized. Mild to moderate aortic valve  sclerosis/calcification without any evidence of aortic stenosis.  8. The pulmonic valve was not well visualized. Pulmonic valve  regurgitation is not visualized.  9. TR signal is inadequate for assessing pulmonary artery systolic  pressure.    Assessment and Plan   1. Afib - new diagnosis during admission with COVID and DKA, unclear if isolated episode in setting of systemic stress or more long term issue - obtain 21 day event monitor - if recurrent afib would need to start anticoag     Arnoldo Lenis, M.D.

## 2019-05-21 NOTE — Telephone Encounter (Signed)
Virtual Visit Pre-Appointment Phone Call  "(Name), I am calling you today to discuss your upcoming appointment. We are currently trying to limit exposure to the virus that causes COVID-19 by seeing patients at home rather than in the office."  1. "What is the BEST phone number to call the day of the visit?" - include this in appointment notes  2. "Do you have or have access to (through a family member/friend) a smartphone with video capability that we can use for your visit?" a. If yes - list this number in appt notes as "cell" (if different from BEST phone #) and list the appointment type as a VIDEO visit in appointment notes b. If no - list the appointment type as a PHONE visit in appointment notes  Confirm consent - "In the setting of the current Covid19 crisis, you are scheduled for a (phone or video) visit with your provider on (date) at (time).  Just as we do with many in-office visits, in order for you to participate in this visit, we must obtain consent.  If you'd like, I can send this to your mychart (if signed up) or email for you to review.  Otherwise, I can obtain your verbal consent now.  All virtual visits are billed to your insurance company just like a normal visit would be.  By agreeing to a virtual visit, we'd like you to understand that the technology does not allow for your provider to perform an examination, and thus may limit your provider's ability to fully assess your condition. If your provider identifies any concerns that need to be evaluated in person, we will make arrangements to do so.  Finally, though the technology is pretty good, we cannot assure that it will always work on either your or our end, and in the setting of a video visit, we may have to convert it to a phone-only visit.  In either situation, we cannot ensure that we have a secure connection.  Are you willing to proceed?" STAFF: Did the patient verbally acknowledge consent to telehealth visit? Document  YES/NO here:  3. Advise patient to be prepared - "Two hours prior to your appointment, go ahead and check your blood pressure, pulse, oxygen saturation, and your weight (if you have the equipment to check those) and write them all down. When your visit starts, your provider will ask you for this information. If you have an Apple Watch or Kardia device, please plan to have heart rate information ready on the day of your appointment. Please have a pen and paper handy nearby the day of the visit as well."  4. Give patient instructions for MyChart download to smartphone OR Doximity/Doxy.me as below if video visit (depending on what platform provider is using)  5. Inform patient they will receive a phone call 15 minutes prior to their appointment time (may be from unknown caller ID) so they should be prepared to answer    TELEPHONE CALL NOTE  Terry [name] has been deemed a candidate for a follow-up tele-health visit to limit community exposure during the Covid-19 pandemic. I spoke with the patient via phone to ensure availability of phone/video source, confirm preferred email & phone number, and discuss instructions and expectations.  I reminded Terry [name] to be prepared with any vital sign and/or heart rhythm information that could potentially be obtained via home monitoring, at the time of his visit. I reminded Terry [name] to expect a phone call prior to his visit.  Dorothey Baseman 05/21/2019 9:57 AM   INSTRUCTIONS FOR DOWNLOADING THE MYCHART APP TO SMARTPHONE  - The patient must first make sure to have activated MyChart and know their login information - If Apple, go to App Store and type in MyChart in the search bar and download the app. If Android, ask patient to go to [employer] and type in [place] in the search bar and download the app. The app is free but as with any other app downloads, their phone may require them to verify saved payment information or Apple/Android  password.  - The patient will need to then log into the app with their MyChart username and password, and select [facility] as their healthcare provider to link the account. When it is time for your visit, go to the MyChart app, find appointments, and click Begin Video Visit. Be sure to Select Allow for your device to access the Microphone and Camera for your visit. You will then be connected, and your provider will be with you shortly.  **If they have any issues connecting, or need assistance please contact MyChart service desk [phone])**  **If using a computer, in order to ensure the best quality for their visit they will need to use either of the following Internet Browsers: [employer], or Google Chrome**  IF USING DOXIMITY or DOXY.ME - The patient will receive a link just prior to their visit by text.     FULL LENGTH CONSENT FOR TELE-HEALTH VISIT   I hereby voluntarily request, consent and authorize [facility] and its employed or contracted physicians, physician assistants, nurse practitioners or other licensed health care professionals (the Practitioner), to provide me with telemedicine health care services (the "Services") as deemed necessary by the treating Practitioner. I acknowledge and consent to receive the Services by the Practitioner via telemedicine. I understand that the telemedicine visit will involve communicating with the Practitioner through live audiovisual communication technology and the disclosure of certain medical information by electronic transmission. I acknowledge that I have been given the opportunity to request an in-person assessment or other available alternative prior to the telemedicine visit and am voluntarily participating in the telemedicine visit.  I understand that I have the right to withhold or withdraw my consent to the use of telemedicine in the course of my care at any time, without affecting my right to future care or treatment,  and that the Practitioner or I may terminate the telemedicine visit at any time. I understand that I have the right to inspect all information obtained and/or recorded in the course of the telemedicine visit and may receive copies of available information for a reasonable fee.  I understand that some of the potential risks of receiving the Services via telemedicine include:  Marland Kitchen Delay or interruption in medical evaluation due to technological equipment failure or disruption; . Information transmitted may not be sufficient (e.g. poor resolution of images) to allow for appropriate medical decision making by the Practitioner; and/or  . In rare instances, security protocols could fail, causing a breach of personal health information.  Furthermore, I acknowledge that it is my responsibility to provide information about my medical history, conditions and care that is complete and accurate to the best of my ability. I acknowledge that Practitioner's advice, recommendations, and/or decision may be based on factors not within their control, such as incomplete or inaccurate data provided by me or distortions of diagnostic images or specimens that may result from electronic transmissions. I understand that the practice  of medicine is not an Chief Strategy Officer and that Practitioner makes no warranties or guarantees regarding treatment outcomes. I acknowledge that I will receive a copy of this consent concurrently upon execution via email to the email address I last provided but may also request a printed copy by calling the office of [facility].    I understand that my insurance will be billed for this visit.   I have read or had this consent read to me. . I understand the contents of this consent, which adequately explains the benefits and risks of the Services being provided via telemedicine.  . I have been provided ample opportunity to ask questions regarding this consent and the Services and have had my questions  answered to my satisfaction. . I give my informed consent for the services to be provided through the use of telemedicine in my medical care  By participating in this telemedicine visit I agree to the above.

## 2019-05-21 NOTE — Addendum Note (Signed)
Addended byDebbora Lacrosse R on: 05/21/2019 09:30 AM   Modules accepted: Orders

## 2019-05-21 NOTE — Patient Instructions (Signed)
Medication Instructions:  Your physician recommends that you continue on your current medications as directed. Please refer to the Current Medication list given to you today.   Labwork: none  Testing/Procedures: Your physician has recommended that you wear an event monitor. Event monitors are medical devices that record the heart's electrical activity. Doctors most often [place] these monitors to diagnose arrhythmias. Arrhythmias are problems with the speed or rhythm of the heartbeat. The monitor is a small, portable device. You can wear one while you do your normal daily activities. This is usually used to diagnose what is causing palpitations/syncope (passing out).    Follow-Up: Your physician recommends that you schedule a follow-up appointment in:  6 weeks   Any Other Special Instructions Will Be Listed Below (If Applicable).     If you need a refill on your cardiac medications before your next appointment, please call your pharmacy.

## 2019-05-23 ENCOUNTER — Other Ambulatory Visit: Payer: Self-pay | Admitting: Family Medicine

## 2019-06-02 DIAGNOSIS — I482 Chronic atrial fibrillation, unspecified: Secondary | ICD-10-CM | POA: Diagnosis not present

## 2019-06-02 DIAGNOSIS — I4891 Unspecified atrial fibrillation: Secondary | ICD-10-CM | POA: Diagnosis not present

## 2019-06-03 ENCOUNTER — Ambulatory Visit (INDEPENDENT_AMBULATORY_CARE_PROVIDER_SITE_OTHER): Payer: BC Managed Care – PPO

## 2019-06-03 ENCOUNTER — Other Ambulatory Visit: Payer: Self-pay

## 2019-06-03 ENCOUNTER — Other Ambulatory Visit: Payer: Self-pay | Admitting: Family Medicine

## 2019-06-03 DIAGNOSIS — I4891 Unspecified atrial fibrillation: Secondary | ICD-10-CM

## 2019-07-02 ENCOUNTER — Encounter: Payer: Self-pay | Admitting: Cardiology

## 2019-07-02 ENCOUNTER — Telehealth (INDEPENDENT_AMBULATORY_CARE_PROVIDER_SITE_OTHER): Payer: BC Managed Care – PPO | Admitting: Cardiology

## 2019-07-02 VITALS — Ht 71.0 in | Wt 192.0 lb

## 2019-07-02 DIAGNOSIS — I4891 Unspecified atrial fibrillation: Secondary | ICD-10-CM

## 2019-07-02 MED ORDER — AMLODIPINE BESYLATE 10 MG PO TABS: 10.0000 mg | ORAL_TABLET | Freq: Every day | ORAL | 3 refills | Status: DC

## 2019-07-02 NOTE — Progress Notes (Signed)
Virtual Visit via Telephone Note   This visit type was conducted due to national recommendations for restrictions regarding the COVID-19 Pandemic (e.g. social distancing) in an effort to limit this patient's exposure and mitigate transmission in our community.  Due to his co-morbid illnesses, this patient is at least at moderate risk for complications without adequate follow up.  This format is felt to be most appropriate for this patient at this time.  The patient did not have access to video technology/had technical difficulties with video requiring transitioning to audio format only (telephone).  All issues noted in this document were discussed and addressed.  No physical exam could be performed with this format.  Please refer to the patient's chart for his  consent to telehealth for [facility].   The patient was identified using 2 identifiers.  Date:  07/02/2019   ID:  [name], DOB 1960/11/24, MRN [id number]  Patient Location: Home Provider Location: Office  PCP:  Kathyrn Drown, MD  Cardiologist:  Dr Harl Bowie Electrophysiologist:  None   Evaluation Performed:  Follow-Up Visit  Chief Complaint:  FOllow up visit  History of Present Illness:    Terry [name] is a 59 y.o. male seen today for follow up of the following medical problems.   1. Afib - transient episode in Jan 2021 during admission with COVID and DKA. Notes mention issues with hypokalemia that admission as well - EKGs reviewed, on Apr 07, 2019 was in afib - no palpitations at all - CHADS2Vasc score is 2 (HTN, DM2)  - recent monitor without afib recurrence.  - no recent palpitations.   2. Prior COVID infection - admitted Jan 2021 with COVID, complicated by DKA   The patient does not have symptoms concerning for COVID-19 infection (fever, chills, cough, or new shortness of breath).    Past Medical History:  Diagnosis Date  . Diabetes mellitus without complication ([place]) 123XX123  . Hyperlipidemia   .  Hypertension   . Osteoarthritis    Past Surgical History:  Procedure Laterality Date  . ANKLE SURGERY Left 2007   2 plates/screws  . COLONOSCOPY N/A 08/04/2017   Procedure: COLONOSCOPY;  Surgeon: Rogene Houston, MD;  [facility];  Service: Endoscopy;  Laterality: N/A;  . ESOPHAGEAL DILATION N/A 08/04/2017   Procedure: ESOPHAGEAL DILATION;  Surgeon: Rogene Houston, MD;  Location[facility];  Service: Endoscopy;  Laterality: N/A;  . ESOPHAGOGASTRODUODENOSCOPY N/A 08/04/2017   Procedure: ESOPHAGOGASTRODUODENOSCOPY (EGD);  Surgeon: Rogene Houston, MD;  Location[facility];  Service: Endoscopy;  Laterality: N/A;  . POLYPECTOMY  08/04/2017   Procedure: POLYPECTOMY;  Surgeon: Rogene Houston, MD;  [facility];  Service: Endoscopy;;     Current Meds  Medication Sig  . ascorbic acid (VITAMIN C) 500 MG tablet Take 1 tablet (500 mg total) by mouth daily.  Marland Kitchen aspirin EC 81 MG tablet Take 1 tablet (81 mg total) by mouth daily with breakfast.  . diltiazem (CARDIZEM CD) 120 MG 24 hr capsule Take 1 capsule (120 mg total) by mouth daily.  . indapamide (LOZOL) 2.5 MG tablet TAKE ONE TABLET BY MOUTH ONCE DAILY.  Marland Kitchen insulin aspart (NOVOLOG) 100 UNIT/ML injection Inject 30 units with meals; may titrate up to 35 units with meals  . LANTUS SOLOSTAR 100 UNIT/ML Solostar Pen INJECT 90 UNITS TWICE DAILY. TITRATE TO 200 UNITS A DAY.  Marland Kitchen liraglutide (VICTOZA) 18 MG/3ML SOPN INJECT 1.8MG  SUBCUTANEOUSLY ONCE DAILY.  Marland Kitchen losartan (COZAAR)  100 MG tablet TAKE ONE TABLET BY MOUTH ONCE DAILY.  . metoprolol succinate (TOPROL-XL) 50 MG 24 hr tablet TAKE ONE TABLET BY MOUTH ONCE DAILY.  . mometasone (ELOCON) 0.1 % cream APPLY TO AFFECTED AREAS TWICE DAILY AS NEEDED.  . Multiple Vitamin (MULTIVITAMIN) tablet Take 1 tablet by mouth daily.  Marland Kitchen NOVOLOG FLEXPEN 100 UNIT/ML FlexPen INJECT 6 UNITS TWICE DAILY. MAY TITRATE UP TO 14 UNITS.  Marland Kitchen oxyCODONE-acetaminophen (PERCOCET) 10-325 MG tablet 1 tablet  4 times daily as needed pain  . pantoprazole (PROTONIX) 40 MG tablet TAKE 1 TABLET BY MOUTH TWICE DAILY, BEFORE A MEAL. (Patient taking differently: Take 40 mg by mouth 2 (two) times daily. TAKE 1 TABLET BY MOUTH TWICE DAILY BEFORE A MEAL.)  . rosuvastatin (CRESTOR) 40 MG tablet TAKE ONE TABLET BY MOUTH ONCE DAILY.  Marland Kitchen saccharomyces boulardii (FLORASTOR) 250 MG capsule Take 1 capsule (250 mg total) by mouth 2 (two) times daily.  . sertraline (ZOLOFT) 50 MG tablet TAKE (1) TABLET BY MOUTH ONCE DAILY. (Patient taking differently: Take 50 mg by mouth daily. TAKE (1) TABLET BY MOUTH ONCE DAILY.)  . sildenafil (REVATIO) 20 MG tablet Take 1-5 tablets before sexual activity as directed. No greater that 5 tablets in a day.  . triamcinolone cream (KENALOG) 0.1 % USE SMALL DABS TID  . zinc sulfate 220 (50 Zn) MG capsule Take 1 capsule (220 mg total) by mouth daily.  . [DISCONTINUED] oxyCODONE-acetaminophen (PERCOCET) 10-325 MG tablet 1 tablet 4 times daily as needed pain     Allergies:   Altace [ramipril], Invokana [canagliflozin], Metformin and related, and Novolog [insulin aspart]   Social History   Tobacco Use  . Smoking status: Former Smoker    Quit date: 06/15/1992    Years since quitting: 27.0  . Smokeless tobacco: Never Used  Substance Use Topics  . Alcohol use: Never  . Drug use: Not Currently    Types: Marijuana    Comment: last used 1970     Family Hx: The patient's family history includes Cancer in his father; Diabetes in his mother; Heart disease in his mother; Hyperlipidemia in his mother; Hypertension in his mother.  ROS:   Please see the history of present illness.     All other systems reviewed and are negative.   Prior CV studies:   The following studies were reviewed today:  Jan 2021 echo 1. Left ventricular ejection fraction, by visual estimation, is 65 to  70%. The left ventricle has hyperdynamic function. There is no left  ventricular hypertrophy.  2. Global right  ventricle has normal systolic function.The right  ventricular size is normal.  3. Left atrial size was normal.  4. Right atrial size was normal.  5. The mitral valve is normal in structure. No evidence of mitral valve  regurgitation.  6. The tricuspid valve is normal in structure.  7. The aortic valve has an indeterminant number of cusps. Aortic valve  regurgitation is not visualized. Mild to moderate aortic valve  sclerosis/calcification without any evidence of aortic stenosis.  8. The pulmonic valve was not well visualized. Pulmonic valve  regurgitation is not visualized.  9. TR signal is inadequate for assessing pulmonary artery systolic  pressure.   Labs/Other Tests and Data Reviewed:    EKG:  No ECG reviewed.  Recent Labs: 04/04/2019: TSH 2.144 04/10/2019: Magnesium 1.9 04/25/2019: ALT 33; BUN 21; Creatinine, Ser 1.22; Hemoglobin 11.8; Platelets 159; Potassium 3.9; Sodium 142   Recent Lipid Panel Lab Results  Component Value Date/Time  CHOL 138 10/29/2018 08:04 AM   TRIG 399 (H) 10/29/2018 08:04 AM   HDL 29 (L) 10/29/2018 08:04 AM   CHOLHDL 4.8 10/29/2018 08:04 AM   CHOLHDL 4.4 02/25/2014 07:27 AM   LDLCALC 29 10/29/2018 08:04 AM    Wt Readings from Last 3 Encounters:  07/02/19 192 lb (87.1 kg)  05/21/19 202 lb (91.6 kg)  04/18/19 178 lb 12.8 oz (81.1 kg)     Objective:    Vital Signs:  Ht 5\' 11"  (1.803 m)   Wt 192 lb (87.1 kg)   BMI 26.78 kg/m    Normal affect. Normal speech pattern and tone. Comfortable, no apparent distress. No audible signs of sob or wheezing.   ASSESSMENT & PLAN:    1. Afib - isolated episode during admission with COVID and DKA, no signs of recurrence by recent cardiac monitor - does not require anticoagulation, can d/c his diltiazem and restart his prior norvasc 10mg  daily    COVID-19 Education: The signs and symptoms of COVID-19 were discussed with the patient and how to seek care for testing (follow up with PCP or arrange  E-visit).  The importance of social distancing was discussed today.  Time:   Today, I have spent 14 minutes with the patient with telehealth technology discussing the above problems.     Medication Adjustments/Labs and Tests Ordered: Current medicines are reviewed at length with the patient today.  Concerns regarding medicines are outlined above.   Tests Ordered: No orders of the defined types were placed in this encounter.   Medication Changes: No orders of the defined types were placed in this encounter.   Follow Up:  Either In Person or Virtual in 1 year(s)  Signed, Carlyle Dolly, MD  07/02/2019 12:44 PM    [place]

## 2019-07-02 NOTE — Addendum Note (Signed)
Addended by: Barbarann Ehlers A on: 07/02/2019 01:41 PM   Modules accepted: Orders

## 2019-07-02 NOTE — Patient Instructions (Signed)
Medication Instructions:  STOP Diltiazem    START Norvasc 10 mg daily   *If you need a refill on your cardiac medications before your next appointment, please call your pharmacy*   Lab Work: None today If you have labs (blood work) drawn today and your tests are completely normal, you will receive your results only by: Marland Kitchen MyChart Message (if you have MyChart) OR . A paper copy in the mail If you have any lab test that is abnormal or we need to change your treatment, we will call you to review the results.   Testing/Procedures: None today   Follow-Up: At [facility], you and your health needs are our priority.  As part of our continuing mission to provide you with exceptional heart care, we have created designated Provider Care Teams.  These Care Teams include your primary Cardiologist (physician) and Advanced Practice Providers (APPs -  Physician Assistants and Nurse Practitioners) who all work together to provide you with the care you need, when you need it.  We recommend signing up for the patient portal called "MyChart".  Sign up information is provided on this After Visit Summary.  MyChart is used to connect with patients for Virtual Visits (Telemedicine).  Patients are able to view lab/test results, encounter notes, upcoming appointments, etc.  Non-urgent messages can be sent to your provider as well.   To learn more about what you can do with MyChart, go to [email or web address].    Your next appointment:   12 month(s)  The format for your next appointment:   In Person  Provider:   Carlyle Dolly, MD   Other Instructions None      Thank you for choosing [facility] !

## 2019-07-08 ENCOUNTER — Other Ambulatory Visit: Payer: Self-pay | Admitting: Family Medicine

## 2019-07-15 ENCOUNTER — Other Ambulatory Visit: Payer: Self-pay

## 2019-07-15 ENCOUNTER — Ambulatory Visit (INDEPENDENT_AMBULATORY_CARE_PROVIDER_SITE_OTHER): Payer: BC Managed Care – PPO | Admitting: Family Medicine

## 2019-07-15 ENCOUNTER — Encounter: Payer: Self-pay | Admitting: Family Medicine

## 2019-07-15 VITALS — BP 152/82 | Temp 97.0°F | Ht 69.0 in | Wt 195.8 lb

## 2019-07-15 DIAGNOSIS — E1129 Type 2 diabetes mellitus with other diabetic kidney complication: Secondary | ICD-10-CM | POA: Diagnosis not present

## 2019-07-15 DIAGNOSIS — E7849 Other hyperlipidemia: Secondary | ICD-10-CM

## 2019-07-15 DIAGNOSIS — R4189 Other symptoms and signs involving cognitive functions and awareness: Secondary | ICD-10-CM

## 2019-07-15 DIAGNOSIS — M2550 Pain in unspecified joint: Secondary | ICD-10-CM

## 2019-07-15 DIAGNOSIS — I10 Essential (primary) hypertension: Secondary | ICD-10-CM

## 2019-07-15 DIAGNOSIS — E119 Type 2 diabetes mellitus without complications: Secondary | ICD-10-CM | POA: Diagnosis not present

## 2019-07-15 DIAGNOSIS — R809 Proteinuria, unspecified: Secondary | ICD-10-CM

## 2019-07-15 LAB — POCT GLYCOSYLATED HEMOGLOBIN (HGB A1C): Hemoglobin A1C: 7.8 % — AB (ref 4.0–5.6)

## 2019-07-15 MED ORDER — OXYCODONE-ACETAMINOPHEN 10-325 MG PO TABS: 1.0000 | ORAL_TABLET | ORAL | 0 refills | Status: DC | PRN

## 2019-07-15 MED ORDER — ROSUVASTATIN CALCIUM 40 MG PO TABS: 40.0000 mg | ORAL_TABLET | Freq: Every day | ORAL | 1 refills | Status: DC

## 2019-07-15 MED ORDER — LOSARTAN POTASSIUM 100 MG PO TABS: 100.0000 mg | ORAL_TABLET | Freq: Every day | ORAL | 1 refills | Status: DC

## 2019-07-15 MED ORDER — VICTOZA 18 MG/3ML ~~LOC~~ SOPN: PEN_INJECTOR | SUBCUTANEOUS | 5 refills | Status: DC

## 2019-07-15 NOTE — Patient Instructions (Signed)

## 2019-07-15 NOTE — Progress Notes (Signed)
Subjective:    Patient ID: Terry [name], male    DOB: [birth year], 59 y.o.   MRN: [id number]  Diabetes He presents for his follow-up diabetic visit. He has type 2 diabetes mellitus. Pertinent negatives for hypoglycemia include no confusion or headaches. Pertinent negatives for diabetes include no chest pain and no weakness. Risk factors for coronary artery disease include dyslipidemia, diabetes mellitus and hypertension. Current diabetic treatment includes insulin injections. He is compliant with treatment all of the time. His weight is stable. He is following a diabetic diet.    Patient reports brain fog since recent hospitalization for Covid. Diabetes mellitus without complication ([place]) - Plan: POCT glycosylated hemoglobin (Hb A1C), Hepatic function panel  Essential hypertension, benign - Plan: Hepatic function panel  Diabetes mellitus with proteinuria ([place]) - Plan: Basic metabolic panel  Other hyperlipidemia - Plan: Lipid panel  Arthralgia, unspecified joint  Cognitive change   He finds himself having difficult time staying focused difficult time processing finds himself having a hard time figuring things out plus also having memory issues ever since having Covid  This patient was seen today for chronic pain  The medication list was reviewed and updated.   -Compliance with medication: Relates takes 4/day states it does take the edge off his pain allows him to function better but he states he has a lot of breakthrough pain he finds himself in more pain than previously  - Number patient states they take daily: 4/day  -when was the last dose patient took?  Earlier today  The patient was advised the importance of maintaining medication and not using illegal substances with these.  Here for refills and follow up  The patient was educated that we can provide 3 monthly scripts for their medication, it is their responsibility to follow the instructions.  Side effects or  complications from medications: Denies side effects  Patient is aware that pain medications are meant to minimize the severity of the pain to allow their pain levels to improve to allow for better function. They are aware of that pain medications cannot totally remove their pain.  Due for UDT ( at least once per year) : Later this year      Review of Systems  Constitutional: Negative for activity change.  HENT: Negative for congestion and rhinorrhea.   Respiratory: Negative for cough and shortness of breath.   Cardiovascular: Negative for chest pain.  Gastrointestinal: Negative for abdominal pain, diarrhea, nausea and vomiting.  Genitourinary: Negative for dysuria and hematuria.  Neurological: Negative for weakness and headaches.  Psychiatric/Behavioral: Negative for behavioral problems and confusion.       Objective:   Physical Exam Vitals reviewed.  Constitutional:      General: He is not in acute distress. HENT:     Head: Normocephalic and atraumatic.  Eyes:     General:        Right eye: No discharge.        Left eye: No discharge.  Neck:     Trachea: No tracheal deviation.  Cardiovascular:     Rate and Rhythm: Normal rate and regular rhythm.     Heart sounds: Normal heart sounds. No murmur.  Pulmonary:     Effort: Pulmonary effort is normal. No respiratory distress.     Breath sounds: Normal breath sounds.  Lymphadenopathy:     Cervical: No cervical adenopathy.  Skin:    General: Skin is warm and dry.  Neurological:     Mental Status: He is alert.  Coordination: Coordination normal.  Psychiatric:        Behavior: Behavior normal.           Assessment & Plan:  1. Diabetes mellitus without complication ([place]) Diabetes fair control needs to do a better job with diet he will also check his sugars more regularly and send [place] some readings continue current medications.  - POCT glycosylated hemoglobin (Hb A1C) - Hepatic function panel  2. Essential  hypertension, benign Blood pressure good control watch salt stay active - Hepatic function panel  3. Diabetes mellitus with proteinuria [facility]) Patient will check kidney function.  Continue current medication.  Avoid excessive protein - Basic metabolic panel  4. Other hyperlipidemia Patient does take his medicine check lab work await the results - Lipid panel  5. Arthralgia, unspecified joint Severe arthralgias in his hands knees hips related to his work related to aging osteoarthritis patient states pain medication is not keeping it under control to the level he would like is requesting 5/day I told him we would go to 5/day but anything above this he would require the need to go to pain management  6. Cognitive change Mild cognitive changes associated associated with recent Covid unfortunately there is nothing now make this go away any quicker or just have to watch this if it becomes progressive we would do work-up for cognitive decline with referral for neuropsychology testing and evaluation  The patient was seen in followup for chronic pain. A review over at their current pain status was discussed. Drug registry was checked. Prescriptions were given.  Regular follow-up recommended. Discussion was held regarding the importance of compliance with medication as well as pain medication contract.  Patient was informed that medication may cause drowsiness and should not be combined  with other medications/alcohol or street drugs. If the patient feels medication is causing altered alertness then do not drive or operate dangerous equipment.

## 2019-07-23 ENCOUNTER — Other Ambulatory Visit: Payer: Self-pay | Admitting: Family Medicine

## 2019-07-29 ENCOUNTER — Other Ambulatory Visit: Payer: Self-pay | Admitting: Family Medicine

## 2019-07-29 DIAGNOSIS — R1319 Other dysphagia: Secondary | ICD-10-CM

## 2019-07-29 DIAGNOSIS — K219 Gastro-esophageal reflux disease without esophagitis: Secondary | ICD-10-CM

## 2019-07-29 DIAGNOSIS — R1310 Dysphagia, unspecified: Secondary | ICD-10-CM

## 2019-08-09 ENCOUNTER — Other Ambulatory Visit: Payer: Self-pay | Admitting: Family Medicine

## 2019-09-11 ENCOUNTER — Other Ambulatory Visit: Payer: Self-pay | Admitting: Family Medicine

## 2019-09-21 ENCOUNTER — Other Ambulatory Visit: Payer: Self-pay | Admitting: Family Medicine

## 2019-09-26 ENCOUNTER — Encounter: Payer: Self-pay | Admitting: Family Medicine

## 2019-09-27 ENCOUNTER — Other Ambulatory Visit: Payer: Self-pay | Admitting: Family Medicine

## 2019-09-27 ENCOUNTER — Telehealth: Payer: Self-pay | Admitting: Family Medicine

## 2019-09-27 NOTE — Telephone Encounter (Signed)
Nurses Please create a telephone message regarding this particular situation and send it to me so that I will have ample time to work on it  Please forward the following message to the patient  [name]  Pain medications are becoming a more difficult situation for pharmacies, doctors and patients.  Federal government is starting to place significant limitations.  This may hamper our ability to manage your pain here at the practice.  In many situations where patients are needing larger doses of medications we are happy to refer them to pain management center.  I do not want you to run out of your medication but I may or may not be able to prescribe at 5 a day depending on parameters and information.  I will look into this issue to see what I can do but it will take more than just today.  Secondly in order to adhere to regulations I will need to be able to do a pill count of your medications.  As well as communicate with pharmacy.  You will need to be able to come by the office for a pill count.  Preferably this is a scheduled visit but I am willing to work you into the days and then to do the pill count.  I realize this is an inconvenience for you but I have to maintain protocols to satisfy regulatory measures.  Please arrange for [place] to do a pill count.  Thanks-Dr. Nicki Reaper

## 2019-09-27 NOTE — Telephone Encounter (Signed)
Phone message open per provider request:  Dr. Nicki Reaper you upped my oxycodone to 5 10-325 per day and the pharmacist at [facility] it past 4 per day which is 120 per month so he has been working with me and filling the prescription a little earlier than usual so that I would have enough. My appointment is not until July 19 at 8:20am can you send or right my prescription early so that we don't have a problem getting it filled because I will not have enough to make it that long please let me know so I can get it filled or not so that there will be enough to make it until I can get it filled again I don't have any refills I don't think until I come to see you on July 19th. Thank you and I hate to bother you with this but I don't know how to do this otherwise. Thanks [employer]

## 2019-10-07 ENCOUNTER — Other Ambulatory Visit: Payer: Self-pay | Admitting: Family Medicine

## 2019-10-08 DIAGNOSIS — E7849 Other hyperlipidemia: Secondary | ICD-10-CM | POA: Diagnosis not present

## 2019-10-08 DIAGNOSIS — E119 Type 2 diabetes mellitus without complications: Secondary | ICD-10-CM | POA: Diagnosis not present

## 2019-10-08 DIAGNOSIS — E1129 Type 2 diabetes mellitus with other diabetic kidney complication: Secondary | ICD-10-CM | POA: Diagnosis not present

## 2019-10-08 DIAGNOSIS — R809 Proteinuria, unspecified: Secondary | ICD-10-CM | POA: Diagnosis not present

## 2019-10-08 DIAGNOSIS — I10 Essential (primary) hypertension: Secondary | ICD-10-CM | POA: Diagnosis not present

## 2019-10-09 LAB — HEPATIC FUNCTION PANEL
ALT: 36 IU/L (ref 0–44)
AST: 24 IU/L (ref 0–40)
Albumin: 5.2 g/dL — ABNORMAL HIGH (ref 3.8–4.9)
Alkaline Phosphatase: 54 IU/L (ref 48–121)
Bilirubin Total: 0.4 mg/dL (ref 0.0–1.2)
Bilirubin, Direct: 0.13 mg/dL (ref 0.00–0.40)
Total Protein: 7.8 g/dL (ref 6.0–8.5)

## 2019-10-09 LAB — LIPID PANEL
Chol/HDL Ratio: 5.4 ratio — ABNORMAL HIGH (ref 0.0–5.0)
Cholesterol, Total: 185 mg/dL (ref 100–199)
HDL: 34 mg/dL — ABNORMAL LOW (ref >39)
LDL Chol Calc (NIH): 79 mg/dL (ref 0–99)
Triglycerides: 450 mg/dL — ABNORMAL HIGH (ref 0–149)
VLDL Cholesterol Cal: 72 mg/dL — ABNORMAL HIGH (ref 5–40)

## 2019-10-09 LAB — BASIC METABOLIC PANEL
BUN/Creatinine Ratio: 23 — ABNORMAL HIGH (ref 9–20)
BUN: 30 mg/dL — ABNORMAL HIGH (ref 6–24)
CO2: 24 mmol/L (ref 20–29)
Calcium: 10.7 mg/dL — ABNORMAL HIGH (ref 8.7–10.2)
Chloride: 98 mmol/L (ref 96–106)
Creatinine, Ser: 1.32 mg/dL — ABNORMAL HIGH (ref 0.76–1.27)
GFR calc Af Amer: 69 mL/min/{1.73_m2} (ref >59)
GFR calc non Af Amer: 59 mL/min/{1.73_m2} — ABNORMAL LOW (ref >59)
Glucose: 257 mg/dL — ABNORMAL HIGH (ref 65–99)
Potassium: 4.6 mmol/L (ref 3.5–5.2)
Sodium: 138 mmol/L (ref 134–144)

## 2019-10-13 NOTE — Telephone Encounter (Signed)
Ov on Monday

## 2019-10-14 ENCOUNTER — Other Ambulatory Visit: Payer: Self-pay

## 2019-10-14 ENCOUNTER — Encounter: Payer: Self-pay | Admitting: Family Medicine

## 2019-10-14 ENCOUNTER — Ambulatory Visit (INDEPENDENT_AMBULATORY_CARE_PROVIDER_SITE_OTHER): Payer: BC Managed Care – PPO | Admitting: Family Medicine

## 2019-10-14 VITALS — BP 138/76 | Temp 98.1°F | Wt 193.0 lb

## 2019-10-14 DIAGNOSIS — K219 Gastro-esophageal reflux disease without esophagitis: Secondary | ICD-10-CM | POA: Diagnosis not present

## 2019-10-14 DIAGNOSIS — R1310 Dysphagia, unspecified: Secondary | ICD-10-CM

## 2019-10-14 DIAGNOSIS — M1990 Unspecified osteoarthritis, unspecified site: Secondary | ICD-10-CM

## 2019-10-14 DIAGNOSIS — R1319 Other dysphagia: Secondary | ICD-10-CM

## 2019-10-14 DIAGNOSIS — Z79891 Long term (current) use of opiate analgesic: Secondary | ICD-10-CM | POA: Diagnosis not present

## 2019-10-14 DIAGNOSIS — R5383 Other fatigue: Secondary | ICD-10-CM

## 2019-10-14 DIAGNOSIS — Z79899 Other long term (current) drug therapy: Secondary | ICD-10-CM

## 2019-10-14 DIAGNOSIS — Z1329 Encounter for screening for other suspected endocrine disorder: Secondary | ICD-10-CM

## 2019-10-14 MED ORDER — SERTRALINE HCL 50 MG PO TABS: ORAL_TABLET | ORAL | 1 refills | Status: DC

## 2019-10-14 MED ORDER — VICTOZA 18 MG/3ML ~~LOC~~ SOPN: PEN_INJECTOR | SUBCUTANEOUS | 5 refills | Status: DC

## 2019-10-14 MED ORDER — METOPROLOL SUCCINATE ER 100 MG PO TB24: ORAL_TABLET | ORAL | 1 refills | Status: DC

## 2019-10-14 MED ORDER — OXYCODONE-ACETAMINOPHEN 10-325 MG PO TABS: ORAL_TABLET | ORAL | 0 refills | Status: DC

## 2019-10-14 MED ORDER — LOSARTAN POTASSIUM 100 MG PO TABS: 100.0000 mg | ORAL_TABLET | Freq: Every day | ORAL | 1 refills | Status: DC

## 2019-10-14 MED ORDER — PANTOPRAZOLE SODIUM 40 MG PO TBEC: 40.0000 mg | DELAYED_RELEASE_TABLET | Freq: Two times a day (BID) | ORAL | 1 refills | Status: DC

## 2019-10-14 MED ORDER — ROSUVASTATIN CALCIUM 40 MG PO TABS: 40.0000 mg | ORAL_TABLET | Freq: Every day | ORAL | 1 refills | Status: DC

## 2019-10-14 MED ORDER — AMLODIPINE BESYLATE 10 MG PO TABS: 10.0000 mg | ORAL_TABLET | Freq: Every day | ORAL | 3 refills | Status: DC

## 2019-10-14 NOTE — Progress Notes (Signed)
Subjective:    Patient ID: Terry [name], male    DOB: 10/11/1960, 59 y.o.   MRN: [id number]  HPI This patient was seen today for chronic pain  The medication list was reviewed and updated.   -Compliance with medication: oxycodone 10-325 mg  - Number patient states they take daily: was told 5 but pharmacy would not fill but for 4/day  -when was the last dose patient took? Yesterday   The patient was advised the importance of maintaining medication and not using illegal substances with these.  Here for refills and follow up  The patient was educated that we can provide 3 monthly scripts for their medication, it is their responsibility to follow the instructions.  Side effects or complications from medications: none  Patient is aware that pain medications are meant to minimize the severity of the pain to allow their pain levels to improve to allow for better function. They are aware of that pain medications cannot totally remove their pain.  Due for UDT ( at least once per year) : done today  Scale of 1 to 10 ( 1 is least 10 is most) Your pain level without the medicine: 10 Your pain level with medication: 2  Scale 1 to 10 ( 1-helps very little, 10 helps very well) How well does your pain medication reduce your pain so you can function better through out the day? 8  Pt having a lot of trouble with pharmacy being able to get 5/day; pt is ok with going back to 4 a day. Pt would like a note for this week to be out due to not being able to work as much due to no pain med.      Review of Systems  Constitutional: Negative for diaphoresis and fatigue.  HENT: Negative for congestion and rhinorrhea.   Respiratory: Negative for cough and shortness of breath.   Cardiovascular: Negative for chest pain and leg swelling.  Gastrointestinal: Negative for abdominal pain and diarrhea.  Skin: Negative for color change and rash.  Neurological: Negative for dizziness and headaches.    Psychiatric/Behavioral: Negative for behavioral problems and confusion.       Objective:   Physical Exam Vitals reviewed.  Constitutional:      General: He is not in acute distress. HENT:     Head: Normocephalic and atraumatic.  Eyes:     General:        Right eye: No discharge.        Left eye: No discharge.  Neck:     Trachea: No tracheal deviation.  Cardiovascular:     Rate and Rhythm: Normal rate and regular rhythm.     Heart sounds: Normal heart sounds. No murmur heard.   Pulmonary:     Effort: Pulmonary effort is normal. No respiratory distress.     Breath sounds: Normal breath sounds.  Lymphadenopathy:     Cervical: No cervical adenopathy.  Skin:    General: Skin is warm and dry.  Neurological:     Mental Status: He is alert.     Coordination: Coordination normal.  Psychiatric:        Behavior: Behavior normal.           Assessment & Plan:  1. Encounter for long-term opiate analgesic use The patient was seen in followup for chronic pain. A review over at their current pain status was discussed. Drug registry was checked. Prescriptions were given.  Regular follow-up recommended. Discussion was held regarding the importance  of compliance with medication as well as pain medication contract.  Patient was informed that medication may cause drowsiness and should not be combined  with other medications/alcohol or street drugs. If the patient feels medication is causing altered alertness then do not drive or operate dangerous equipment.  Patient having difficult time getting 5/day through his pharmacy after discussion we have decided to stick with 4/day 3 weeks prescriptions were written today - ToxASSURE Select 13 (MW), Urine  2. 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 Gastroesophageal reflux disease without esophagitis Reflux he states most of the time he has taken twice per day to get decent control I have encouraged him in the future if taking it 1/day will keep it under good control to do so - pantoprazole (PROTONIX) 40 MG tablet; Take 1 tablet (40 mg total) by mouth 2 (two) times daily. TAKE 1 TABLET BY MOUTH TWICE DAILY BEFORE A MEAL.  Dispense: 180 tablet; Refill: 1 - TSH - T4, free - CBC with Differential - Basic Metabolic Panel (BMET)  4. Esophageal dysphagia See above - pantoprazole (PROTONIX) 40 MG tablet; Take 1 tablet (40 mg total) by mouth 2 (two) times daily. TAKE 1 TABLET BY MOUTH TWICE DAILY BEFORE A MEAL.  Dispense: 180 tablet; Refill: 1 - TSH - T4, free - CBC with Differential - Basic Metabolic Panel (BMET)  5. Other fatigue He relates a lot of fatigue and tiredness he denies sleep apnea symptoms he keeps extremely busy with work as well as stuff at home therefore he does not get a lot of rest or downtime this could be contributing to his fatigue he denies being depressed.  Denies misusing his medicines.  We will check lab work - TSH - T4, free - CBC with Differential - Basic Metabolic Panel (BMET)  6. High risk medication use Check lab work liver function not necessary at this time - TSH - T4, free - CBC with Differential - Basic Metabolic Panel (BMET)  7. Screening for thyroid disorder Check lab work - TSH - T4, free - CBC with Differential - Basic Metabolic Panel (BMET)  Diabetes-patient will check readings periodically over the course of the next 2 weeks and send [place] some readings Patient will go ahead and increase the long-acting insulin to get better control Lab work again in 3 to 4 months time Additional information sent via MyChart

## 2019-10-17 LAB — TOXASSURE SELECT 13 (MW), URINE

## 2019-10-21 ENCOUNTER — Other Ambulatory Visit: Payer: Self-pay | Admitting: Family Medicine

## 2019-11-05 ENCOUNTER — Encounter: Payer: Self-pay | Admitting: Family Medicine

## 2019-11-11 DIAGNOSIS — R1310 Dysphagia, unspecified: Secondary | ICD-10-CM | POA: Diagnosis not present

## 2019-11-11 DIAGNOSIS — M1990 Unspecified osteoarthritis, unspecified site: Secondary | ICD-10-CM | POA: Diagnosis not present

## 2019-11-11 DIAGNOSIS — R5383 Other fatigue: Secondary | ICD-10-CM | POA: Diagnosis not present

## 2019-11-11 DIAGNOSIS — K219 Gastro-esophageal reflux disease without esophagitis: Secondary | ICD-10-CM | POA: Diagnosis not present

## 2019-11-11 DIAGNOSIS — Z79899 Other long term (current) drug therapy: Secondary | ICD-10-CM | POA: Diagnosis not present

## 2019-11-12 LAB — CBC WITH DIFFERENTIAL/PLATELET
Basophils Absolute: 0.1 10*3/uL (ref 0.0–0.2)
Basos: 1 %
EOS (ABSOLUTE): 0.1 10*3/uL (ref 0.0–0.4)
Eos: 1 %
Hematocrit: 43.1 % (ref 37.5–51.0)
Hemoglobin: 15.1 g/dL (ref 13.0–17.7)
Immature Grans (Abs): 0 10*3/uL (ref 0.0–0.1)
Immature Granulocytes: 0 %
Lymphocytes Absolute: 2.2 10*3/uL (ref 0.7–3.1)
Lymphs: 23 %
MCH: 30.9 pg (ref 26.6–33.0)
MCHC: 35 g/dL (ref 31.5–35.7)
MCV: 88 fL (ref 79–97)
Monocytes Absolute: 0.6 10*3/uL (ref 0.1–0.9)
Monocytes: 7 %
Neutrophils Absolute: 6.3 10*3/uL (ref 1.4–7.0)
Neutrophils: 68 %
Platelets: 142 10*3/uL — ABNORMAL LOW (ref 150–450)
RBC: 4.89 x10E6/uL (ref 4.14–5.80)
RDW: 11.9 % (ref 11.6–15.4)
WBC: 9.3 10*3/uL (ref 3.4–10.8)

## 2019-11-12 LAB — T4, FREE: Free T4: 1.44 ng/dL (ref 0.82–1.77)

## 2019-11-12 LAB — BASIC METABOLIC PANEL
BUN/Creatinine Ratio: 19 (ref 9–20)
BUN: 25 mg/dL — ABNORMAL HIGH (ref 6–24)
CO2: 24 mmol/L (ref 20–29)
Calcium: 10.4 mg/dL — ABNORMAL HIGH (ref 8.7–10.2)
Chloride: 97 mmol/L (ref 96–106)
Creatinine, Ser: 1.34 mg/dL — ABNORMAL HIGH (ref 0.76–1.27)
GFR calc Af Amer: 67 mL/min/{1.73_m2} (ref >59)
GFR calc non Af Amer: 58 mL/min/{1.73_m2} — ABNORMAL LOW (ref >59)
Glucose: 278 mg/dL — ABNORMAL HIGH (ref 65–99)
Potassium: 4.6 mmol/L (ref 3.5–5.2)
Sodium: 138 mmol/L (ref 134–144)

## 2019-11-12 LAB — TSH: TSH: 1.47 u[IU]/mL (ref 0.450–4.500)

## 2019-11-21 ENCOUNTER — Other Ambulatory Visit: Payer: Self-pay | Admitting: Family Medicine

## 2019-11-21 DIAGNOSIS — D696 Thrombocytopenia, unspecified: Secondary | ICD-10-CM

## 2019-11-21 DIAGNOSIS — N289 Disorder of kidney and ureter, unspecified: Secondary | ICD-10-CM

## 2019-11-21 DIAGNOSIS — E875 Hyperkalemia: Secondary | ICD-10-CM

## 2019-11-22 DIAGNOSIS — N289 Disorder of kidney and ureter, unspecified: Secondary | ICD-10-CM | POA: Diagnosis not present

## 2019-11-22 DIAGNOSIS — D696 Thrombocytopenia, unspecified: Secondary | ICD-10-CM | POA: Diagnosis not present

## 2019-11-22 DIAGNOSIS — E875 Hyperkalemia: Secondary | ICD-10-CM | POA: Diagnosis not present

## 2019-11-23 LAB — BASIC METABOLIC PANEL
BUN/Creatinine Ratio: 16 (ref 9–20)
BUN: 26 mg/dL — ABNORMAL HIGH (ref 6–24)
CO2: 27 mmol/L (ref 20–29)
Calcium: 9.2 mg/dL (ref 8.7–10.2)
Chloride: 95 mmol/L — ABNORMAL LOW (ref 96–106)
Creatinine, Ser: 1.66 mg/dL — ABNORMAL HIGH (ref 0.76–1.27)
GFR calc Af Amer: 52 mL/min/{1.73_m2} — ABNORMAL LOW (ref >59)
GFR calc non Af Amer: 45 mL/min/{1.73_m2} — ABNORMAL LOW (ref >59)
Glucose: 533 mg/dL (ref 65–99)
Potassium: 4.8 mmol/L (ref 3.5–5.2)
Sodium: 134 mmol/L (ref 134–144)

## 2019-11-23 LAB — CBC WITH DIFFERENTIAL/PLATELET
Basophils Absolute: 0.1 10*3/uL (ref 0.0–0.2)
Basos: 1 %
EOS (ABSOLUTE): 0.2 10*3/uL (ref 0.0–0.4)
Eos: 2 %
Hematocrit: 34.9 % — ABNORMAL LOW (ref 37.5–51.0)
Hemoglobin: 12 g/dL — ABNORMAL LOW (ref 13.0–17.7)
Immature Grans (Abs): 0 10*3/uL (ref 0.0–0.1)
Immature Granulocytes: 0 %
Lymphocytes Absolute: 2.8 10*3/uL (ref 0.7–3.1)
Lymphs: 41 %
MCH: 30.5 pg (ref 26.6–33.0)
MCHC: 34.4 g/dL (ref 31.5–35.7)
MCV: 89 fL (ref 79–97)
Monocytes Absolute: 0.6 10*3/uL (ref 0.1–0.9)
Monocytes: 9 %
Neutrophils Absolute: 3.3 10*3/uL (ref 1.4–7.0)
Neutrophils: 47 %
Platelets: 130 10*3/uL — ABNORMAL LOW (ref 150–450)
RBC: 3.93 x10E6/uL — ABNORMAL LOW (ref 4.14–5.80)
RDW: 12.1 % (ref 11.6–15.4)
WBC: 7 10*3/uL (ref 3.4–10.8)

## 2019-11-23 LAB — VITAMIN D 25 HYDROXY (VIT D DEFICIENCY, FRACTURES): Vit D, 25-Hydroxy: 30.1 ng/mL (ref 30.0–100.0)

## 2019-11-23 LAB — PTH, INTACT AND CALCIUM: PTH: 35 pg/mL (ref 15–65)

## 2019-12-12 ENCOUNTER — Other Ambulatory Visit: Payer: Self-pay | Admitting: Family Medicine

## 2020-01-06 ENCOUNTER — Other Ambulatory Visit: Payer: Self-pay | Admitting: Family Medicine

## 2020-01-07 MED ORDER — OXYCODONE-ACETAMINOPHEN 10-325 MG PO TABS: ORAL_TABLET | ORAL | 0 refills | Status: DC

## 2020-01-10 ENCOUNTER — Other Ambulatory Visit: Payer: Self-pay | Admitting: Family Medicine

## 2020-01-10 NOTE — Telephone Encounter (Signed)
Thsi was sent in on 10/12 and confirmed by pharm but I cannot deny since its controlled.

## 2020-01-17 ENCOUNTER — Ambulatory Visit (INDEPENDENT_AMBULATORY_CARE_PROVIDER_SITE_OTHER): Payer: BC Managed Care – PPO | Admitting: Family Medicine

## 2020-01-17 ENCOUNTER — Encounter: Payer: Self-pay | Admitting: Family Medicine

## 2020-01-17 VITALS — BP 140/68 | HR 95 | Temp 97.4°F | Ht 69.0 in | Wt 205.0 lb

## 2020-01-17 DIAGNOSIS — E1129 Type 2 diabetes mellitus with other diabetic kidney complication: Secondary | ICD-10-CM | POA: Diagnosis not present

## 2020-01-17 DIAGNOSIS — E7849 Other hyperlipidemia: Secondary | ICD-10-CM | POA: Diagnosis not present

## 2020-01-17 DIAGNOSIS — Z125 Encounter for screening for malignant neoplasm of prostate: Secondary | ICD-10-CM

## 2020-01-17 DIAGNOSIS — Z79899 Other long term (current) drug therapy: Secondary | ICD-10-CM

## 2020-01-17 DIAGNOSIS — M1990 Unspecified osteoarthritis, unspecified site: Secondary | ICD-10-CM | POA: Diagnosis not present

## 2020-01-17 DIAGNOSIS — M2550 Pain in unspecified joint: Secondary | ICD-10-CM

## 2020-01-17 DIAGNOSIS — Z23 Encounter for immunization: Secondary | ICD-10-CM

## 2020-01-17 DIAGNOSIS — R809 Proteinuria, unspecified: Secondary | ICD-10-CM

## 2020-01-17 MED ORDER — OXYCODONE-ACETAMINOPHEN 10-325 MG PO TABS
ORAL_TABLET | ORAL | 0 refills | Status: DC
Start: 2020-01-17 — End: 2020-02-17

## 2020-01-17 MED ORDER — OXYCODONE-ACETAMINOPHEN 10-325 MG PO TABS: ORAL_TABLET | ORAL | 0 refills | Status: DC

## 2020-01-17 NOTE — Progress Notes (Signed)
Subjective:    Patient ID: Terry [name], male    DOB: 09-30-1960, 59 y.o.   MRN: [id number]  HPI This patient was seen today for chronic pain  The medication list was reviewed and updated.   -Compliance with medication: takes 4 a day  - Number patient states they take daily: 4 a day  -when was the last dose patient took? Today  The patient was advised the importance of maintaining medication and not using illegal substances with these.  Here for refills and follow up  The patient was educated that we can provide 3 monthly scripts for their medication, it is their responsibility to follow the instructions.  Side effects or complications from medications: none  Patient is aware that pain medications are meant to minimize the severity of the pain to allow their pain levels to improve to allow for better function. They are aware of that pain medications cannot totally remove their pain.  Due for UDT ( at least once per year) : last one done 10/14/19  Scale of 1 to 10 ( 1 is least 10 is most) Your pain level without the medicine: 8-9 Your pain level with medication 4 -5  Scale 1 to 10 ( 1-helps very little, 10 helps very well) How well does your pain medication reduce your pain so you can function better through out the day? 4 -5  Left shoulder pain since about last February  Right hand pain for several years. Getting worse.        Review of Systems  Constitutional: Negative for activity change.  HENT: Negative for congestion and rhinorrhea.   Respiratory: Negative for cough and shortness of breath.   Cardiovascular: Negative for chest pain.  Gastrointestinal: Negative for abdominal pain, diarrhea, nausea and vomiting.  Genitourinary: Negative for dysuria and hematuria.  Musculoskeletal: Positive for arthralgias and back pain.  Neurological: Negative for weakness and headaches.  Psychiatric/Behavioral: Negative for behavioral problems and confusion.       Objective:     Physical Exam Constitutional:      General: He is not in acute distress.    Appearance: He is well-developed.  HENT:     Head: Normocephalic.  Cardiovascular:     Rate and Rhythm: Normal rate and regular rhythm.     Heart sounds: Normal heart sounds. No murmur heard.   Pulmonary:     Effort: Pulmonary effort is normal.     Breath sounds: Normal breath sounds.  Skin:    General: Skin is warm and dry.  Neurological:     Mental Status: He is alert.  Psychiatric:        Behavior: Behavior normal.           Assessment & Plan:  1. Diabetes mellitus with proteinuria (HCC) Patient's glucoses have been running high he will adjust up the long-acting insulin 5 units every 4 to 5 days to get the morning numbers between 101 130 - Hemoglobin A1c  2. Osteoarthritis, unspecified osteoarthritis type, unspecified site Patient with osteoarthritis of the hands getting worse causing him a lot of joint pain.  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 The patient was seen in followup for chronic pain. A review over at their current pain status was discussed. Drug registry was checked. Prescriptions were given.  Regular follow-up recommended. Discussion was held regarding the importance of compliance with medication as well as pain medication contract.  Patient was informed that medication may cause drowsiness and  should not be combined  with other medications/alcohol or street drugs. If the patient feels medication is causing altered alertness then do not drive or operate dangerous equipment.  3 pain prescriptions were sent in see him back in approximately 3 months 3. Arthralgia, unspecified joint See above  4. Other hyperlipidemia Very important for patient keep cholesterol under good control because of his diabetes  5. Screening for prostate cancer Screening - PSA  6. High risk medication use Patient has renal insufficiency with proteinuria check met 7 - Basic metabolic  panel  7. Need for vaccination Flu shot today - Flu Vaccine QUAD 6+ mos PF IM (Fluarix Quad PF)  Vomiting-early morning.  Probably related to Victoza.  I encouraged him to stop the Victoza for 3 weeks.  If the vomiting subsides it is due to the big toes if it does not subside he could have gastroparesis and will need to have a gastric emptying study

## 2020-01-17 NOTE — Patient Instructions (Signed)
Please stop Victoza Please give [place] an update in 2 to 3 weeks how your morning nausea and vomiting is doing It is possible that the Victoza is causing this It is possible that you are also developing what is called gastroparesis If your vomiting goes away after stopping the Victoza then we will stay off of Victoza Please remember you may need to increase your long-acting insulin by 5 units every 4 to 5 days until you see your numbers look where they need to be Feel free to send [place] readings intermittently Feel free to ask [place] any questions It is possible you may need to have a special imaging study for gastric emptying But for now stopping the Victoza to see if this symptom is related to it is the best approach. Please do your lab work. We will see you again in 3 months thanks

## 2020-01-18 LAB — BASIC METABOLIC PANEL
BUN/Creatinine Ratio: 23 — ABNORMAL HIGH (ref 9–20)
BUN: 31 mg/dL — ABNORMAL HIGH (ref 6–24)
CO2: 25 mmol/L (ref 20–29)
Calcium: 10.1 mg/dL (ref 8.7–10.2)
Chloride: 94 mmol/L — ABNORMAL LOW (ref 96–106)
Creatinine, Ser: 1.36 mg/dL — ABNORMAL HIGH (ref 0.76–1.27)
GFR calc Af Amer: 66 mL/min/{1.73_m2} (ref >59)
GFR calc non Af Amer: 57 mL/min/{1.73_m2} — ABNORMAL LOW (ref >59)
Glucose: 313 mg/dL — ABNORMAL HIGH (ref 65–99)
Potassium: 4.7 mmol/L (ref 3.5–5.2)
Sodium: 136 mmol/L (ref 134–144)

## 2020-01-18 LAB — HEMOGLOBIN A1C
Est. average glucose Bld gHb Est-mCnc: 243 mg/dL
Hgb A1c MFr Bld: 10.1 % — ABNORMAL HIGH (ref 4.8–5.6)

## 2020-01-18 LAB — PSA: Prostate Specific Ag, Serum: 0.2 ng/mL (ref 0.0–4.0)

## 2020-01-20 ENCOUNTER — Other Ambulatory Visit: Payer: Self-pay | Admitting: Family Medicine

## 2020-01-24 ENCOUNTER — Other Ambulatory Visit: Payer: Self-pay | Admitting: Family Medicine

## 2020-02-02 ENCOUNTER — Telehealth: Payer: Self-pay | Admitting: Family Medicine

## 2020-02-02 NOTE — Telephone Encounter (Signed)
Nurses Please tried to connect with the patient regarding his lab results from October If his wife is on his approved let us connect with her This patient needs referral to endocrinology his diabetes is not under good control and he is at significant risk of diabetic complications because of this.  I would recommend either Dr. Dorris Fetch or [facility] for endocrinology thank you  Patient was to come here for his pain management and management of other health issues

## 2020-02-03 ENCOUNTER — Other Ambulatory Visit: Payer: Self-pay | Admitting: *Deleted

## 2020-02-03 DIAGNOSIS — E119 Type 2 diabetes mellitus without complications: Secondary | ICD-10-CM

## 2020-02-03 DIAGNOSIS — E1129 Type 2 diabetes mellitus with other diabetic kidney complication: Secondary | ICD-10-CM

## 2020-02-03 DIAGNOSIS — R809 Proteinuria, unspecified: Secondary | ICD-10-CM

## 2020-02-03 NOTE — Telephone Encounter (Signed)
Left message to return call 

## 2020-02-03 NOTE — Telephone Encounter (Signed)
Patient notified of Dr. Bary Leriche recommendation. Referral put in for Dr. Dorris Fetch.

## 2020-02-11 ENCOUNTER — Other Ambulatory Visit: Payer: Self-pay | Admitting: Family Medicine

## 2020-02-17 ENCOUNTER — Ambulatory Visit: Payer: BC Managed Care – PPO | Admitting: Nurse Practitioner

## 2020-02-17 ENCOUNTER — Encounter: Payer: Self-pay | Admitting: Nurse Practitioner

## 2020-02-17 ENCOUNTER — Other Ambulatory Visit: Payer: Self-pay

## 2020-02-17 VITALS — BP 181/79 | HR 82 | Ht 70.0 in | Wt 208.6 lb

## 2020-02-17 DIAGNOSIS — E782 Mixed hyperlipidemia: Secondary | ICD-10-CM

## 2020-02-17 DIAGNOSIS — I10 Essential (primary) hypertension: Secondary | ICD-10-CM

## 2020-02-17 DIAGNOSIS — E119 Type 2 diabetes mellitus without complications: Secondary | ICD-10-CM

## 2020-02-17 MED ORDER — NOVOLOG FLEXPEN 100 UNIT/ML ~~LOC~~ SOPN
30.0000 [IU] | PEN_INJECTOR | Freq: Three times a day (TID) | SUBCUTANEOUS | 2 refills | Status: DC
Start: 2020-02-17 — End: 2020-03-11

## 2020-02-17 MED ORDER — LANTUS SOLOSTAR 100 UNIT/ML ~~LOC~~ SOPN: 90.0000 [IU] | PEN_INJECTOR | Freq: Every day | SUBCUTANEOUS | 5 refills | Status: DC

## 2020-02-17 NOTE — Progress Notes (Signed)
Endocrinology Consult Note       02/17/2020, 9:03 AM   Subjective:    Patient ID: Terry [name], male    DOB: [birth year].  Terry [name] is being seen in consultation for management of currently uncontrolled symptomatic diabetes requested by  Kathyrn Drown, MD.   Past Medical History:  Diagnosis Date  . Diabetes mellitus without complication ([place]) 1448  . Hyperlipidemia   . Hypertension   . Osteoarthritis     Past Surgical History:  Procedure Laterality Date  . ANKLE SURGERY Left 2007   2 plates/screws  . COLONOSCOPY N/A 08/04/2017   Procedure: COLONOSCOPY;  Surgeon: Rogene Houston, MD;  [facility];  Service: Endoscopy;  Laterality: N/A;  . ESOPHAGEAL DILATION N/A 08/04/2017   Procedure: ESOPHAGEAL DILATION;  Surgeon: Rogene Houston, MD;  [facility];  Service: Endoscopy;  Laterality: N/A;  . ESOPHAGOGASTRODUODENOSCOPY N/A 08/04/2017   Procedure: ESOPHAGOGASTRODUODENOSCOPY (EGD);  Surgeon: Rogene Houston, MD;  [facility]: [facility];  Service: Endoscopy;  Laterality: N/A;  . POLYPECTOMY  08/04/2017   Procedure: POLYPECTOMY;  Surgeon: Rogene Houston, MD;  Location: [facility];  Service: Endoscopy;;    Social History   Socioeconomic History  . Marital status: Married    Spouse name: Not on file  . Number of children: Not on file  . Years of education: Not on file  . Highest education level: Not on file  Occupational History  . Not on file  Tobacco Use  . Smoking status: Former Smoker    Quit date: 06/15/1992    Years since quitting: 27.6  . Smokeless tobacco: Never Used  Vaping Use  . Vaping Use: Never used  Substance and Sexual Activity  . Alcohol use: Never  . Drug use: Not Currently    Types: Marijuana    Comment: last used 1970  . Sexual activity: Not on file  Other Topics Concern  . Not on file  Social History Narrative  . Not on file    Social Determinants of Health   Financial Resource Strain:   . Difficulty of Paying Living Expenses: Not on file  Food Insecurity:   . Worried About Charity fundraiser in the Last Year: Not on file  . Ran Out of Food in the Last Year: Not on file  Transportation Needs:   . Lack of Transportation (Medical): Not on file  . Lack of Transportation (Non-Medical): Not on file  Physical Activity:   . Days of Exercise per Week: Not on file  . Minutes of Exercise per Session: Not on file  Stress:   . Feeling of Stress : Not on file  Social Connections:   . Frequency of Communication with Friends and Family: Not on file  . Frequency of Social Gatherings with Friends and Family: Not on file  . Attends Religious Services: Not on file  . Active Member of Clubs or Organizations: Not on file  . Attends Archivist Meetings: Not on file  . Marital Status: Not on file    Family History  Problem Relation Age of Onset  .  Hypertension Mother   . Diabetes Mother   . Heart disease Mother   . Hyperlipidemia Mother   . Cancer Father        lung    Outpatient Encounter Medications as of 02/17/2020  Medication Sig  . ascorbic acid (VITAMIN C) 500 MG tablet Take 1 tablet (500 mg total) by mouth daily.  Marland Kitchen aspirin EC 81 MG tablet Take 1 tablet (81 mg total) by mouth daily with breakfast.  . CONTOUR NEXT TEST test strip TEST BLOOD SUGAR 4 TIMES DAILY.  . indapamide (LOZOL) 2.5 MG tablet TAKE ONE TABLET BY MOUTH ONCE DAILY.  Marland Kitchen LANTUS SOLOSTAR 100 UNIT/ML Solostar Pen INJECT 90 UNITS SUBCUTANEOUSLY TWICE DAILY. TITRATE TO 200 UNITS A DAY.  Marland Kitchen losartan (COZAAR) 100 MG tablet Take 1 tablet (100 mg total) by mouth daily.  . metoprolol succinate (TOPROL-XL) 100 MG 24 hr tablet TAKE (1) TABLET BY MOUTH ONCE DAILY.  . mometasone (ELOCON) 0.1 % cream APPLY TO AFFECTED AREAS TWICE DAILY AS NEEDED.  . Multiple Vitamin (MULTIVITAMIN) tablet Take 1 tablet by mouth daily.  Marland Kitchen NOVOLOG FLEXPEN 100 UNIT/ML  FlexPen INJECT 6 UNITS TWICE DAILY. MAY TITRATE UP TO 14 UNITS.  Marland Kitchen pantoprazole (PROTONIX) 40 MG tablet Take 1 tablet (40 mg total) by mouth 2 (two) times daily. TAKE 1 TABLET BY MOUTH TWICE DAILY BEFORE A MEAL.  . rosuvastatin (CRESTOR) 40 MG tablet TAKE ONE TABLET BY MOUTH ONCE DAILY.  Marland Kitchen saccharomyces boulardii (FLORASTOR) 250 MG capsule Take 1 capsule (250 mg total) by mouth 2 (two) times daily.  . sildenafil (REVATIO) 20 MG tablet Take 1-5 tablets before sexual activity as directed. No greater that 5 tablets in a day.  . triamcinolone cream (KENALOG) 0.1 % USE SMALL DABS TID  . zinc sulfate 220 (50 Zn) MG capsule Take 1 capsule (220 mg total) by mouth daily.  . [DISCONTINUED] oxyCODONE-acetaminophen (PERCOCET) 10-325 MG tablet Take one tablet po 4 times daily prn pain  . amLODipine (NORVASC) 10 MG tablet Take 1 tablet (10 mg total) by mouth daily.  . insulin aspart (NOVOLOG) 100 UNIT/ML injection Inject 30 units with meals; may titrate up to 35 units with meals (Patient not taking: Reported on 02/17/2020)  . liraglutide (VICTOZA) 18 MG/3ML SOPN INJECT 1.8MG  SUBCUTANEOUSLY ONCE DAILY. (Patient not taking: Reported on 02/17/2020)  . sertraline (ZOLOFT) 50 MG tablet 1 qd (Patient not taking: Reported on 02/17/2020)  . [DISCONTINUED] oxyCODONE-acetaminophen (PERCOCET) 10-325 MG tablet Take one tablet po 4 times daily prn pain  . [DISCONTINUED] oxyCODONE-acetaminophen (PERCOCET) 10-325 MG tablet Take one tablet po 4 times daily prn pain   No facility-administered encounter medications on file as of 02/17/2020.    ALLERGIES: Allergies  Allergen Reactions  . Altace [Ramipril] Cough  . Invokana [Canagliflozin] Other (See Comments)    laryngitis  . Metformin And Related Diarrhea    VACCINATION STATUS: Immunization History  Administered Date(s) Administered  . Influenza,inj,Quad PF,6+ Mos 01/30/2014, 01/29/2016, 01/30/2017, 01/26/2018, 01/23/2019, 01/17/2020  . Influenza-Unspecified  01/26/2012, 02/21/2015  . Pneumococcal Polysaccharide-23 01/30/2014  . Td 12/19/2014    Diabetes He presents for his initial diabetic visit. He has type 2 diabetes mellitus. Onset time: Diagnosed at approximate age of 55. His disease course has been worsening. Hypoglycemia symptoms include sweats and tremors. Associated symptoms include blurred vision, fatigue, polydipsia, polyphagia and polyuria. There are no hypoglycemic complications. Symptoms are stable. Diabetic complications include nephropathy. Risk factors for coronary artery disease include diabetes mellitus, dyslipidemia, hypertension, male sex and obesity. Current  diabetic treatment includes intensive insulin program. He is compliant with treatment most of the time. His weight is increasing steadily. He is following a generally unhealthy diet. When asked about meal planning, he reported none. He has not had a previous visit with a dietitian. He participates in exercise intermittently. (He presents today for his consultation with no meter or logs to review.  His most recent A1c was 10.1% on 01/17/20.  He is currently on Lantus 90 units SQ twice daily and Novolog 30 units TID with meals.  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  He admits to drinking sugary beverages such as diet soda and artificially flavored water (Propel).  He reports he did not tolerate Victoza in the past (caused severe vomiting), nor Metformin (diarrhea), or Invokana (laryngitis).  He does have occasional hypoglycemia for which he has symptoms of tremors and sweats. ) An ACE inhibitor/angiotensin II receptor blocker is being taken. He does not see a podiatrist.Eye exam is not current.  Hypertension This is a chronic problem. The current episode started more than 1 year ago. The problem has been waxing and waning since onset. The problem is uncontrolled. Associated symptoms include blurred vision and sweats. There  are no associated agents to hypertension. Risk factors for coronary artery disease include diabetes mellitus, dyslipidemia, male gender and obesity. Past treatments include angiotensin blockers and calcium channel blockers. The current treatment provides mild improvement. Compliance problems include diet and exercise.  Hypertensive end-organ damage includes kidney disease. Identifiable causes of hypertension include chronic renal disease.  Hyperlipidemia This is a chronic problem. The current episode started more than 1 year ago. The problem is uncontrolled. Recent lipid tests were reviewed and are variable. Exacerbating diseases include chronic renal disease. Factors aggravating his hyperlipidemia include fatty foods. Current antihyperlipidemic treatment includes statins. The current treatment provides mild improvement of lipids. Compliance problems include adherence to diet and adherence to exercise.  Risk factors for coronary artery disease include diabetes mellitus, dyslipidemia, hypertension, male sex and obesity.    Review of systems  Constitutional: + Minimally fluctuating body weight,  current Body mass index is 29.93 kg/m. , + fatigue, no subjective hyperthermia, no subjective hypothermia Eyes: no blurry vision, no xerophthalmia ENT: no sore throat, no nodules palpated in throat, no dysphagia/odynophagia, no hoarseness Cardiovascular: no chest pain, no shortness of breath, no palpitations, no leg swelling Respiratory: no cough, no shortness of breath Gastrointestinal: no nausea/vomiting/diarrhea Genitourinary: +polyuria (nighttime) Musculoskeletal: no muscle/joint aches Skin: no rashes, no hyperemia Neurological: no tremors, no numbness, no tingling, no dizziness Psychiatric: no depression, no anxiety   Objective:     BP (!) 181/79   Pulse 82   Ht 5\' 10"  (1.778 m)   Wt 208 lb 9.6 oz (94.6 kg)   BMI 29.93 kg/m   Wt Readings from Last 3 Encounters:  02/17/20 208 lb 9.6 oz (94.6  kg)  01/17/20 205 lb (93 kg)  10/14/19 193 lb (87.5 kg)    BP Readings from Last 3 Encounters:  02/17/20 (!) 181/79  01/17/20 140/68  10/14/19 138/76      Physical Exam- Limited  Constitutional:  Body mass index is 29.93 kg/m. , not in acute distress, normal state of mind Eyes:  EOMI, no exophthalmos Neck: Supple Thyroid: No gross goiter Cardiovascular: RRR, no murmers, rubs, or gallops, no edema Respiratory: Adequate breathing efforts, no crackles, rales, rhonchi, or wheezing Musculoskeletal: no gross deformities, strength intact in  all four extremities, no gross restriction of joint movements Skin:  no rashes, no hyperemia Neurological: no tremor with outstretched hands   CMP ( most recent) CMP     Component Value Date/Time   NA 136 01/17/2020 0928   K 4.7 01/17/2020 0928   CL 94 (L) 01/17/2020 0928   CO2 25 01/17/2020 0928   GLUCOSE 313 (H) 01/17/2020 0928   GLUCOSE 156 (H) 04/10/2019 0430   BUN 31 (H) 01/17/2020 0928   CREATININE 1.36 (H) 01/17/2020 0928   CREATININE 0.91 02/25/2014 0727   CALCIUM 10.1 01/17/2020 0928   PROT 7.8 10/08/2019 0844   ALBUMIN 5.2 (H) 10/08/2019 0844   AST 24 10/08/2019 0844   ALT 36 10/08/2019 0844   ALKPHOS 54 10/08/2019 0844   BILITOT 0.4 10/08/2019 0844   GFRNONAA 57 (L) 01/17/2020 0928   GFRAA 66 01/17/2020 0928     Diabetic Labs (most recent): Lab Results  Component Value Date   HGBA1C 10.1 (H) 01/17/2020   HGBA1C 7.8 (A) 07/15/2019   HGBA1C 8.4 (H) 02/07/2019     Lipid Panel ( most recent) Lipid Panel     Component Value Date/Time   CHOL 185 10/08/2019 0844   TRIG 450 (H) 10/08/2019 0844   HDL 34 (L) 10/08/2019 0844   CHOLHDL 5.4 (H) 10/08/2019 0844   CHOLHDL 4.4 02/25/2014 0727   VLDL 35 02/25/2014 0727   LDLCALC 79 10/08/2019 0844   LABVLDL 72 (H) 10/08/2019 0844      Lab Results  Component Value Date   TSH 1.470 11/11/2019   TSH 2.144 04/04/2019   TSH 0.986 07/01/2015   FREET4 1.44 11/11/2019            Assessment & Plan:   1. DM type 2 without retinopathy ([place])  - Terry [name] has currently uncontrolled symptomatic type 2 DM since  59 years of age,  with most recent A1c of 10.1 %. Recent labs reviewed.  He presents today for his consultation with no meter or logs to review.  His most recent A1c was 10.1% on 01/17/20.  He is currently on Lantus 90 units SQ twice daily and Novolog 30 units TID with meals.  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  He admits to drinking sugary beverages such as diet soda and artificially flavored water (Propel).  He reports he did not tolerate Victoza in the past (caused severe vomiting), nor Metformin (diarrhea), or Invokana (laryngitis).  He does have occasional hypoglycemia for which he has symptoms of tremors and sweats.   - I had a long discussion with him about the progressive nature of diabetes and the pathology behind its complications. -his diabetes is complicated by CKD stage 3 and he remains at a high risk for more acute and chronic complications which include CAD, CVA, CKD, retinopathy, and neuropathy. These are all discussed in detail with him.  - I have counseled him on diet  and weight management  by adopting a carbohydrate restricted/protein rich diet. Patient is encouraged to switch to  unprocessed or minimally processed  complex starch and increased protein intake (animal or plant source), fruits, and vegetables. -  he is advised to stick to a routine mealtimes to eat 3 meals  a day and avoid unnecessary snacks ( to snack only to correct hypoglycemia).   - he acknowledges that there is a room for improvement in his food and drink choices. -  Suggestion is made for him to avoid simple carbohydrates  from his diet including Cakes, Sweet Desserts, Ice Cream, Soda (diet and regular), Sweet Tea, Candies, Chips, Cookies, Store Bought Juices, Alcohol in Excess of  1-2 drinks a day,  Artificial Sweeteners,  Coffee Creamer, and "Sugar-free" Products. This will help patient to have more stable blood glucose profile and potentially avoid unintended weight gain.  - he will be scheduled with Jearld Fenton, RDN, CDE for diabetes education.  - I have approached him with the following individualized plan to manage  his diabetes and patient agrees:   -He is advised to lower his Lantus to 90 units SQ once daily before bed.  He is to continue Novolog 30-36 units TID with meals if glucose is above 90 and he is eating.  Specific instructions on how to titrate insulin dose given to patient in writing.  -He is encouraged to monitor blood glucose 4 times per day, before meals and before bed, to log his readings on the logs provided today, and to bring them with him to his follow up appointment in 2 weeks.  - he is warned not to take insulin without proper monitoring per orders.  - He may benefit from addition of low dose Metformin (ER tabs) in the future.  -He is not a candidate for incretin therapy at this time due to elevated triglycerides increasing his risk for pancreatitis.  - Specific targets for  A1c;  LDL, HDL,  and Triglycerides were discussed with the patient.  2) Blood Pressure /Hypertension: His blood pressure is not controlled to target.  He says his BP is typically high in doctors offices due to white coat syndrome.  He is advised to continue Cozaar 100 mg po daily and Metoprolol 100 mg po daily.  3) Lipids/Hyperlipidemia:    His most recent lipid panel from 10/08/19 shows controlled LDL at 79 and elevated triglycerides of 450.  He is advised to continue Crestor 40 mg po daily at bedtime.  Side effects and precautions discussed with him.  4)  Weight/Diet:  His Body mass index is 29.93 kg/m.  -   he is a candidate for weight loss. I discussed with him the fact that loss of 5 - 10% of his  current body weight will have the most impact on his diabetes management.  Exercise,  and detailed carbohydrates information provided  -  detailed on discharge instructions.  5) Chronic Care/Health Maintenance: -he on ACEI/ARB and Statin medications and  is encouraged to initiate and continue to follow up with Ophthalmology, Dentist,  Podiatrist at least yearly or according to recommendations, and advised to stay away from smoking. I have recommended yearly flu vaccine and pneumonia vaccine at least every 5 years; moderate intensity exercise for up to 150 minutes weekly; and  sleep for at least 7 hours a day.  - he is  advised to maintain close follow up with Kathyrn Drown, MD for primary care needs, as well as his other providers for optimal and coordinated care.   - Time spent in this patient care: 60 min, of which > 50% was spent in  counseling  him about his diabetes and the rest reviewing his blood glucose logs , discussing his hypoglycemia and hyperglycemia episodes, reviewing his current and  previous labs / studies  ( including abstraction from other facilities) and medications  doses and developing a  long term treatment plan based on the latest standards of care/ guidelines; and documenting his care.  Please refer to Patient Instructions for Blood Glucose Monitoring and Insulin/Medications Dosing Guide"  in media tab for additional information. Please  also refer to " Patient Self Inventory" in the Media  tab for reviewed elements of pertinent patient history.  Terry [name] participated in the discussions, expressed understanding, and voiced agreement with the above plans.  All questions were answered to his satisfaction. he is encouraged to contact clinic should he have any questions or concerns prior to his return visit.   Follow up plan: - Return in about 2 weeks (around 03/02/2020) for Diabetes follow up, Bring glucometer and logs, No previsit labs, ABI next visit.  Rayetta Pigg, [facility] [address] [place],  [place] [address] Phone: [phone] Fax: [phone]  02/17/2020, 9:03 AM

## 2020-02-17 NOTE — Patient Instructions (Signed)

## 2020-03-02 ENCOUNTER — Ambulatory Visit: Payer: BC Managed Care – PPO | Admitting: Nurse Practitioner

## 2020-03-09 ENCOUNTER — Ambulatory Visit: Payer: BC Managed Care – PPO | Admitting: Nurse Practitioner

## 2020-03-09 ENCOUNTER — Encounter: Payer: Self-pay | Admitting: Nurse Practitioner

## 2020-03-09 ENCOUNTER — Encounter: Payer: Self-pay | Admitting: Nutrition

## 2020-03-09 ENCOUNTER — Other Ambulatory Visit: Payer: Self-pay

## 2020-03-09 ENCOUNTER — Encounter: Payer: BC Managed Care – PPO | Attending: Nurse Practitioner | Admitting: Nutrition

## 2020-03-09 VITALS — Ht 70.0 in | Wt 206.0 lb

## 2020-03-09 VITALS — BP 170/89 | HR 96 | Ht 70.0 in | Wt 206.0 lb

## 2020-03-09 DIAGNOSIS — R809 Proteinuria, unspecified: Secondary | ICD-10-CM | POA: Diagnosis not present

## 2020-03-09 DIAGNOSIS — E782 Mixed hyperlipidemia: Secondary | ICD-10-CM | POA: Insufficient documentation

## 2020-03-09 DIAGNOSIS — E1129 Type 2 diabetes mellitus with other diabetic kidney complication: Secondary | ICD-10-CM | POA: Diagnosis not present

## 2020-03-09 DIAGNOSIS — I10 Essential (primary) hypertension: Secondary | ICD-10-CM | POA: Diagnosis not present

## 2020-03-09 DIAGNOSIS — E119 Type 2 diabetes mellitus without complications: Secondary | ICD-10-CM | POA: Diagnosis not present

## 2020-03-09 LAB — POCT UA - MICROALBUMIN
Creatinine, POC: 200 mg/dL
Microalbumin Ur, POC: 150 mg/L

## 2020-03-09 MED ORDER — DEXCOM G6 TRANSMITTER MISC: 1.0000 | 4 refills | Status: DC

## 2020-03-09 MED ORDER — DEXCOM G6 SENSOR MISC: 3 refills | Status: DC

## 2020-03-09 NOTE — Patient Instructions (Signed)

## 2020-03-09 NOTE — Progress Notes (Signed)
°  Medical Nutrition Therapy:  Appt start time: 1030 end time:  1130.   Assessment:  Primary concerns today: Diabetes Type 2, HTN, Hyperlipidemia. A1C 10.1%. Eats 2 meals per day. Has reflux and arthritis.. Works for himself and usually skips lunch due to working. Is under stress to get jobs done quickly. 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  Admits he could be more consistent with his medications. Doesn't check blood sugars as often as he knows he should. Feels tired and stays thirsty at time. Doesn't plan meals. Willing to work on making changes with diet to improve his DM and take medications as prescribed. Sees Rayetta Pigg, FNP at [facility]. PCP Dr. Wolfgang Phoenix.   Lab Results  Component Value Date   HGBA1C 10.1 (H) 01/17/2020   Lipid Panel     Component Value Date/Time   CHOL 185 10/08/2019 0844   TRIG 450 (H) 10/08/2019 0844   HDL 34 (L) 10/08/2019 0844   CHOLHDL 5.4 (H) 10/08/2019 0844   CHOLHDL 4.4 02/25/2014 0727   VLDL 35 02/25/2014 0727   LDLCALC 79 10/08/2019 0844   LABVLDL 72 (H) 10/08/2019 0844   CMP Latest Ref Rng & Units 01/17/2020 11/22/2019 11/11/2019  Glucose 65 - 99 mg/dL 313(H) 533(HH) 278(H)  BUN 6 - 24 mg/dL 31(H) 26(H) 25(H)  Creatinine 0.76 - 1.27 mg/dL 1.36(H) 1.66(H) 1.34(H)  Sodium 134 - 144 mmol/L 136 134 138  Potassium 3.5 - 5.2 mmol/L 4.7 4.8 4.6  Chloride 96 - 106 mmol/L 94(L) 95(L) 97  CO2 20 - 29 mmol/L 25 27 24   Calcium 8.7 - 10.2 mg/dL 10.1 9.2 10.4(H)  Total Protein 6.0 - 8.5 g/dL - - -  Total Bilirubin 0.0 - 1.2 mg/dL - - -  Alkaline Phos 48 - 121 IU/L - - -  AST 0 - 40 IU/L - - -  ALT 0 - 44 IU/L - - -     Preferred Learning Style:   No preference indicated   Learning Readiness:   Ready  Change in progress   MEDICATIONS:  DIETARY INTAKE:  24-hr recall:  B) Eggs 3, L) Pizza leftovers, water, D) skipped or usually leftovers.   Usual physical activity: Installs cabinets  Estimated  energy needs: 1800-2000  calories 200  g carbohydrates 135 g protein 50 g fat  Progress Towards Goal(s):  In progress.   Nutritional Diagnosis:  NB-1.1 Food and nutrition-related knowledge deficit As related to Diabetes Type 2.  As evidenced by A1C 10.4%.    Intervention: Nutrition and Diabetes education provided on My Plate, CHO counting, meal planning, portion sizes, timing of meals, avoiding snacks between meals unless having a low blood sugar, target ranges for A1C and blood sugars, signs/symptoms and treatment of hyper/hypoglycemia, monitoring blood sugars, taking medications as prescribed, benefits of exercising 30 minutes per day and prevention of complications of DM. Goals  Follow  My Plate Eat 3-4 carb choices per meal Don't skip meals. Don't take insulin if not eating. Drink only water Record BS on sheets and bring to appointments Get A1C down to 7%   Teaching Method Utilized:  Visual Auditory Hands on  Handouts given during visit include:  The Plate Method   Meal Plan Card  Diabetes instructions.     Barriers to learning/adherence to lifestyle change: none  Demonstrated degree of understanding via:  Teach Back   Monitoring/Evaluation:  Dietary intake, exercise,  and body weight in 1 month(s).

## 2020-03-09 NOTE — Patient Instructions (Signed)
Goals  Follow  My Plate Eat 3-4 carb choices per meal Don't skip meals. Don't take insulin if not eating. Drink only water Record BS on sheets and bring to appointments Get A1C down to 7%

## 2020-03-09 NOTE — Progress Notes (Signed)
Endocrinology Follow Up Visit      03/09/2020, 10:32 AM   Subjective:    Patient ID: Terry [name], male    DOB: Oct 26, 1960.  Terry [name] is being seen in follow up after being seen in consultation for management of currently uncontrolled symptomatic diabetes requested by  Kathyrn Drown, MD.   Past Medical History:  Diagnosis Date  . Diabetes mellitus without complication ([place]) 6295  . Hyperlipidemia   . Hypertension   . Osteoarthritis     Past Surgical History:  Procedure Laterality Date  . ANKLE SURGERY Left 2007   2 plates/screws  . COLONOSCOPY N/A 08/04/2017   Procedure: COLONOSCOPY;  Surgeon: Rogene Houston, MD;  [facility];  Service: Endoscopy;  Laterality: N/A;  . ESOPHAGEAL DILATION N/A 08/04/2017   Procedure: ESOPHAGEAL DILATION;  Surgeon: Rogene Houston, MD;  Location: [facility];  Service: Endoscopy;  Laterality: N/A;  . ESOPHAGOGASTRODUODENOSCOPY N/A 08/04/2017   Procedure: ESOPHAGOGASTRODUODENOSCOPY (EGD);  Surgeon: Rogene Houston, MD;  Location: [facility];  Service: Endoscopy;  Laterality: N/A;  . POLYPECTOMY  08/04/2017   Procedure: POLYPECTOMY;  Surgeon: Rogene Houston, MD;  Location: [facility];  Service: Endoscopy;;    Social History   Socioeconomic History  . Marital status: Married    Spouse name: Not on file  . Number of children: Not on file  . Years of education: Not on file  . Highest education level: Not on file  Occupational History  . Not on file  Tobacco Use  . Smoking status: Former Smoker    Quit date: 06/15/1992    Years since quitting: 27.7  . Smokeless tobacco: Never Used  Vaping Use  . Vaping Use: Never used  Substance and Sexual Activity  . Alcohol use: Never  . Drug use: Not Currently    Types: Marijuana    Comment: last used 1970  . Sexual activity: Not on file  Other Topics Concern  . Not on file  Social History Narrative   . Not on file   Social Determinants of Health   Financial Resource Strain: Not on file  Food Insecurity: Not on file  Transportation Needs: Not on file  Physical Activity: Not on file  Stress: Not on file  Social Connections: Not on file    Family History  Problem Relation Age of Onset  . Hypertension Mother   . Diabetes Mother   . Heart disease Mother   . Hyperlipidemia Mother   . Cancer Father        lung    Outpatient Encounter Medications as of 03/09/2020  Medication Sig  . ascorbic acid (VITAMIN C) 500 MG tablet Take 1 tablet (500 mg total) by mouth daily.  Marland Kitchen aspirin EC 81 MG tablet Take 1 tablet (81 mg total) by mouth daily with breakfast.  . CONTOUR NEXT TEST test strip TEST BLOOD SUGAR 4 TIMES DAILY.  . indapamide (LOZOL) 2.5 MG tablet TAKE ONE TABLET BY MOUTH ONCE DAILY.  Marland Kitchen insulin aspart (NOVOLOG FLEXPEN) 100 UNIT/ML FlexPen Inject 30-36 Units into the skin 3 (three) times daily with meals.  . insulin glargine (LANTUS SOLOSTAR) 100 UNIT/ML Solostar Pen  Inject 90 Units into the skin at bedtime.  Marland Kitchen losartan (COZAAR) 100 MG tablet Take 1 tablet (100 mg total) by mouth daily.  . metoprolol succinate (TOPROL-XL) 100 MG 24 hr tablet TAKE (1) TABLET BY MOUTH ONCE DAILY.  . mometasone (ELOCON) 0.1 % cream APPLY TO AFFECTED AREAS TWICE DAILY AS NEEDED.  . Multiple Vitamin (MULTIVITAMIN) tablet Take 1 tablet by mouth daily.  . pantoprazole (PROTONIX) 40 MG tablet Take 1 tablet (40 mg total) by mouth 2 (two) times daily. TAKE 1 TABLET BY MOUTH TWICE DAILY BEFORE A MEAL.  . rosuvastatin (CRESTOR) 40 MG tablet TAKE ONE TABLET BY MOUTH ONCE DAILY.  Marland Kitchen saccharomyces boulardii (FLORASTOR) 250 MG capsule Take 1 capsule (250 mg total) by mouth 2 (two) times daily.  . sertraline (ZOLOFT) 50 MG tablet 1 qd  . sildenafil (REVATIO) 20 MG tablet Take 1-5 tablets before sexual activity as directed. No greater that 5 tablets in a day.  . triamcinolone cream (KENALOG) 0.1 % USE SMALL DABS TID   . zinc sulfate 220 (50 Zn) MG capsule Take 1 capsule (220 mg total) by mouth daily.  Marland Kitchen amLODipine (NORVASC) 10 MG tablet Take 1 tablet (10 mg total) by mouth daily.   No facility-administered encounter medications on file as of 03/09/2020.    ALLERGIES: Allergies  Allergen Reactions  . Altace [Ramipril] Cough  . Invokana [Canagliflozin] Other (See Comments)    laryngitis  . Metformin And Related Diarrhea    VACCINATION STATUS: Immunization History  Administered Date(s) Administered  . Influenza,inj,Quad PF,6+ Mos 01/30/2014, 01/29/2016, 01/30/2017, 01/26/2018, 01/23/2019, 01/17/2020  . Influenza-Unspecified 01/26/2012, 02/21/2015  . Pneumococcal Polysaccharide-23 01/30/2014  . Td 12/19/2014    Diabetes He presents for his initial diabetic visit. He has type 2 diabetes mellitus. Onset time: Diagnosed at approximate age of 59. His disease course has been improving. There are no hypoglycemic associated symptoms. Associated symptoms include fatigue, polydipsia and polyphagia. Pertinent negatives for diabetes include no blurred vision and no polyuria. There are no hypoglycemic complications. Symptoms are improving. Diabetic complications include nephropathy. Risk factors for coronary artery disease include diabetes mellitus, dyslipidemia, hypertension, male sex and obesity. Current diabetic treatment includes intensive insulin program. He is compliant with treatment most of the time. His weight is fluctuating minimally. He is following a diabetic diet. When asked about meal planning, he reported none. He has not had a previous visit with a dietitian. He participates in exercise intermittently. His home blood glucose trend is fluctuating minimally. His breakfast blood glucose range is generally >200 mg/dl. His lunch blood glucose range is generally 180-200 mg/dl. His dinner blood glucose range is generally 140-180 mg/dl. His bedtime blood glucose range is generally >200 mg/dl. (He presents today  with his meter and logs showing slight improvement in his glycemic profile.  He has been taking his Lantus in the morning instead of at night and has not been doing the sliding scale for his bolus insulin as suggested.  There are no episodes of hypoglycemia noted.) An ACE inhibitor/angiotensin II receptor blocker is being taken. He does not see a podiatrist.Eye exam is not current.  Hypertension This is a chronic problem. The current episode started more than 1 year ago. The problem has been waxing and waning since onset. The problem is uncontrolled. Pertinent negatives include no blurred vision. There are no associated agents to hypertension. Risk factors for coronary artery disease include diabetes mellitus, dyslipidemia, male gender and obesity. Past treatments include angiotensin blockers and calcium channel blockers. The current  treatment provides mild improvement. Compliance problems include diet and exercise.  Hypertensive end-organ damage includes kidney disease. Identifiable causes of hypertension include chronic renal disease.  Hyperlipidemia This is a chronic problem. The current episode started more than 1 year ago. The problem is uncontrolled. Recent lipid tests were reviewed and are variable. Exacerbating diseases include chronic renal disease. Factors aggravating his hyperlipidemia include fatty foods. Current antihyperlipidemic treatment includes statins. The current treatment provides mild improvement of lipids. Compliance problems include adherence to diet and adherence to exercise.  Risk factors for coronary artery disease include diabetes mellitus, dyslipidemia, hypertension, male sex and obesity.    Review of systems  Constitutional: + Minimally fluctuating body weight,  current Body mass index is 29.56 kg/m. , + fatigue, no subjective hyperthermia, no subjective hypothermia Eyes: no blurry vision, no xerophthalmia ENT: no sore throat, no nodules palpated in throat, no  dysphagia/odynophagia, no hoarseness Cardiovascular: no chest pain, no shortness of breath, no palpitations, no leg swelling Respiratory: no cough, no shortness of breath Gastrointestinal: no nausea/vomiting/diarrhea Genitourinary: +polyuria (nighttime) Musculoskeletal: no muscle/joint aches Skin: no rashes, no hyperemia, (has tender spot to left calf, is scheduling appt with PCP for this) Neurological: no tremors, no numbness, no tingling, no dizziness Psychiatric: no depression, no anxiety   Objective:     BP (!) 170/89 (BP Location: Right Arm)   Pulse 96   Ht 5\' 10"  (1.778 m)   Wt 206 lb (93.4 kg)   BMI 29.56 kg/m   Wt Readings from Last 3 Encounters:  03/09/20 206 lb (93.4 kg)  03/09/20 206 lb (93.4 kg)  02/17/20 208 lb 9.6 oz (94.6 kg)    BP Readings from Last 3 Encounters:  03/09/20 (!) 170/89  02/17/20 (!) 181/79  01/17/20 140/68     Physical Exam- Limited  Constitutional:  Body mass index is 29.56 kg/m. , not in acute distress, normal state of mind Eyes:  EOMI, no exophthalmos Neck: Supple Cardiovascular: RRR, no murmers, rubs, or gallops, no edema Respiratory: Adequate breathing efforts, no crackles, rales, rhonchi, or wheezing Musculoskeletal: no gross deformities, strength intact in all four extremities, no gross restriction of joint movements Skin:  no rashes, no hyperemia Neurological: no tremor with outstretched hands    POCT ABI Results 03/09/20   Right ABI:  1.08      Left ABI:  1.18  Right leg systolic / diastolic: 388/828 mmHg Left leg systolic / diastolic: 003/491 mmHg  Arm systolic / diastolic: 791/50 mmHG  Detailed report will be scanned into patient chart.    CMP ( most recent) CMP     Component Value Date/Time   NA 136 01/17/2020 0928   K 4.7 01/17/2020 0928   CL 94 (L) 01/17/2020 0928   CO2 25 01/17/2020 0928   GLUCOSE 313 (H) 01/17/2020 0928   GLUCOSE 156 (H) 04/10/2019 0430   BUN 31 (H) 01/17/2020 0928   CREATININE 1.36  (H) 01/17/2020 0928   CREATININE 0.91 02/25/2014 0727   CALCIUM 10.1 01/17/2020 0928   PROT 7.8 10/08/2019 0844   ALBUMIN 5.2 (H) 10/08/2019 0844   AST 24 10/08/2019 0844   ALT 36 10/08/2019 0844   ALKPHOS 54 10/08/2019 0844   BILITOT 0.4 10/08/2019 0844   GFRNONAA 57 (L) 01/17/2020 0928   GFRAA 66 01/17/2020 0928     Diabetic Labs (most recent): Lab Results  Component Value Date   HGBA1C 10.1 (H) 01/17/2020   HGBA1C 7.8 (A) 07/15/2019   HGBA1C 8.4 (H) 02/07/2019  Lipid Panel ( most recent) Lipid Panel     Component Value Date/Time   CHOL 185 10/08/2019 0844   TRIG 450 (H) 10/08/2019 0844   HDL 34 (L) 10/08/2019 0844   CHOLHDL 5.4 (H) 10/08/2019 0844   CHOLHDL 4.4 02/25/2014 0727   VLDL 35 02/25/2014 0727   LDLCALC 79 10/08/2019 0844   LABVLDL 72 (H) 10/08/2019 0844      Lab Results  Component Value Date   TSH 1.470 11/11/2019   TSH 2.144 04/04/2019   TSH 0.986 07/01/2015   FREET4 1.44 11/11/2019           Assessment & Plan:   1) DM type 2 without retinopathy ([place])  - Terry [name] has currently uncontrolled symptomatic type 2 DM since  59 years of age,  with most recent A1c of 10.1 %. Recent labs reviewed.  He presents today with his meter and logs showing slight improvement in his glycemic profile.  He has been taking his Lantus in the morning instead of at night and has not been doing the sliding scale for his bolus insulin as suggested.  There are no episodes of hypoglycemia noted.  - I had a long discussion with him about the progressive nature of diabetes and the pathology behind its complications. -his diabetes is complicated by CKD stage 3 and he remains at a high risk for more acute and chronic complications which include CAD, CVA, CKD, retinopathy, and neuropathy. These are all discussed in detail with him.  - Nutritional counseling repeated at each appointment due to patients tendency to fall back in to old habits.  - The patient admits  there is a room for improvement in their diet and drink choices. -  Suggestion is made for the patient to avoid simple carbohydrates from their diet including Cakes, Sweet Desserts / Pastries, Ice Cream, Soda (diet and regular), Sweet Tea, Candies, Chips, Cookies, Sweet Pastries,  Store Bought Juices, Alcohol in Excess of  1-2 drinks a day, Artificial Sweeteners, Coffee Creamer, and "Sugar-free" Products. This will help patient to have stable blood glucose profile and potentially avoid unintended weight gain.   - I encouraged the patient to switch to  unprocessed or minimally processed complex starch and increased protein intake (animal or plant source), fruits, and vegetables.   - Patient is advised to stick to a routine mealtimes to eat 3 meals  a day and avoid unnecessary snacks ( to snack only to correct hypoglycemia).  - he is scheduled with Jearld Fenton, RDN, CDE for diabetes education after his appt with me today.  - I have approached him with the following individualized plan to manage  his diabetes and patient agrees:   -He is advised to continue his Lantus to 90 units SQ once daily before bed.  He is to continue Novolog 30-36 units TID with meals if glucose is above 90 and he is eating.  Specific instructions on how to titrate insulin dose based on glucose readings given to patient in writing.  -He is encouraged to monitor blood glucose 4 times per day, before meals and before bed, and to call the clinic if he has readings less than 70 or greater than 300 for 3 tests in a row.  He could greatly benefit from CGM device.  I provided him with Dexcom sample from the office today (I sent in rx to his pharmacy).  - he is warned not to take insulin without proper monitoring per orders.  - He may benefit  from addition of low dose Metformin (ER tabs) in the future.  -He is not a candidate for incretin therapy at this time due to elevated triglycerides increasing his risk for pancreatitis.  -  Specific targets for  A1c;  LDL, HDL,  and Triglycerides were discussed with the patient.  2) Blood Pressure /Hypertension: His blood pressure is not controlled to target.  He says his BP is typically high in doctors offices due to white coat syndrome.  He is advised to continue Cozaar 100 mg po daily and Metoprolol 100 mg po daily.  He is encouraged to monitor BP at home and notify me if his readings are greater than 140/90 consistently.    3) Lipids/Hyperlipidemia:    His most recent lipid panel from 10/08/19 shows controlled LDL at 79 and elevated triglycerides of 450.  He is advised to continue Crestor 40 mg po daily at bedtime.  Side effects and precautions discussed with him.  4)  Weight/Diet:  His Body mass index is 29.56 kg/m.  -   he is a candidate for weight loss. I discussed with him the fact that loss of 5 - 10% of his  current body weight will have the most impact on his diabetes management.  Exercise, and detailed carbohydrates information provided  -  detailed on discharge instructions.  5) Chronic Care/Health Maintenance: -he on ACEI/ARB and Statin medications and  is encouraged to initiate and continue to follow up with Ophthalmology, Dentist,  Podiatrist at least yearly or according to recommendations, and advised to stay away from smoking. I have recommended yearly flu vaccine and pneumonia vaccine at least every 5 years; moderate intensity exercise for up to 150 minutes weekly; and  sleep for at least 7 hours a day.  - he is  advised to maintain close follow up with Kathyrn Drown, MD for primary care needs, as well as his other providers for optimal and coordinated care.   - Time spent on this patient care encounter:  35 min, of which > 50% was spent in  counseling and the rest reviewing his blood glucose logs , discussing his hypoglycemia and hyperglycemia episodes, reviewing his current and  previous labs / studies  ( including abstraction from other facilities) and  medications  doses and developing a  long term treatment plan and documenting his care.   Please refer to Patient Instructions for Blood Glucose Monitoring and Insulin/Medications Dosing Guide"  in media tab for additional information. Please  also refer to " Patient Self Inventory" in the Media  tab for reviewed elements of pertinent patient history.  Terry [name] participated in the discussions, expressed understanding, and voiced agreement with the above plans.  All questions were answered to his satisfaction. he is encouraged to contact clinic should he have any questions or concerns prior to his return visit.   Follow up plan: - Return in about 3 months (around 06/07/2020) for Diabetes follow up with A1c in office, No previsit labs, Bring glucometer and logs.  Rayetta Pigg, [facility] [address] [place], [place] [address] Phone: [phone] Fax: [phone]  03/09/2020, 10:32 AM

## 2020-03-11 ENCOUNTER — Other Ambulatory Visit: Payer: Self-pay | Admitting: Nurse Practitioner

## 2020-03-11 ENCOUNTER — Other Ambulatory Visit: Payer: Self-pay

## 2020-03-11 DIAGNOSIS — E119 Type 2 diabetes mellitus without complications: Secondary | ICD-10-CM

## 2020-03-11 MED ORDER — NOVOLOG FLEXPEN 100 UNIT/ML ~~LOC~~ SOPN: 30.0000 [IU] | PEN_INJECTOR | Freq: Three times a day (TID) | SUBCUTANEOUS | 2 refills | Status: DC

## 2020-03-16 ENCOUNTER — Other Ambulatory Visit: Payer: Self-pay

## 2020-03-16 ENCOUNTER — Telehealth: Payer: Self-pay

## 2020-03-16 DIAGNOSIS — E119 Type 2 diabetes mellitus without complications: Secondary | ICD-10-CM

## 2020-03-16 MED ORDER — NOVOLOG FLEXPEN 100 UNIT/ML ~~LOC~~ SOPN: 30.0000 [IU] | PEN_INJECTOR | Freq: Three times a day (TID) | SUBCUTANEOUS | 2 refills | Status: DC

## 2020-03-16 MED ORDER — LANTUS SOLOSTAR 100 UNIT/ML ~~LOC~~ SOPN: 90.0000 [IU] | PEN_INJECTOR | Freq: Every day | SUBCUTANEOUS | 1 refills | Status: DC

## 2020-03-16 NOTE — Telephone Encounter (Signed)
Patient's wife left a VM that over the weekend he had Lantus called in. Only 5 pens were called and she said he uses about 100 units per day and that is not going to cover a month supply. She is asking for a month supply to be called in, please. Call back at [phone]

## 2020-03-16 NOTE — Telephone Encounter (Signed)
SENT IN

## 2020-03-17 ENCOUNTER — Encounter: Payer: Self-pay | Admitting: Nutrition

## 2020-04-06 ENCOUNTER — Other Ambulatory Visit: Payer: Self-pay | Admitting: Family Medicine

## 2020-04-06 ENCOUNTER — Ambulatory Visit: Payer: BC Managed Care – PPO | Admitting: Nutrition

## 2020-04-07 ENCOUNTER — Telehealth: Payer: Self-pay

## 2020-04-07 NOTE — Telephone Encounter (Signed)
Patient's wife called and said he threw away the transmitter. He did not realize that it was good for 45 days. She is saying that we gave him this in the office. She said he did not realize how this worked. The insurance will not cover another RX for this. Do we hve anymore to give?

## 2020-04-07 NOTE — Telephone Encounter (Signed)
Returned call to patient, will p/u new one here, advised of the instructions, verbalized understanding.

## 2020-04-10 ENCOUNTER — Ambulatory Visit (INDEPENDENT_AMBULATORY_CARE_PROVIDER_SITE_OTHER): Payer: BC Managed Care – PPO | Admitting: Family Medicine

## 2020-04-10 ENCOUNTER — Other Ambulatory Visit: Payer: Self-pay

## 2020-04-10 ENCOUNTER — Encounter: Payer: Self-pay | Admitting: Family Medicine

## 2020-04-10 ENCOUNTER — Other Ambulatory Visit: Payer: Self-pay | Admitting: *Deleted

## 2020-04-10 VITALS — BP 138/86 | Temp 98.1°F | Ht 70.0 in | Wt 205.4 lb

## 2020-04-10 DIAGNOSIS — M25512 Pain in left shoulder: Secondary | ICD-10-CM | POA: Diagnosis not present

## 2020-04-10 DIAGNOSIS — Z79891 Long term (current) use of opiate analgesic: Secondary | ICD-10-CM

## 2020-04-10 DIAGNOSIS — I10 Essential (primary) hypertension: Secondary | ICD-10-CM

## 2020-04-10 DIAGNOSIS — E7849 Other hyperlipidemia: Secondary | ICD-10-CM | POA: Diagnosis not present

## 2020-04-10 MED ORDER — OXYCODONE-ACETAMINOPHEN 10-325 MG PO TABS
ORAL_TABLET | ORAL | 0 refills | Status: DC
Start: 2020-04-10 — End: 2020-04-24

## 2020-04-10 MED ORDER — OXYCODONE-ACETAMINOPHEN 10-325 MG PO TABS: ORAL_TABLET | ORAL | 0 refills | Status: DC

## 2020-04-10 NOTE — Patient Instructions (Addendum)
  This is some additional information regarding Covid vaccine.  The Covid vaccine-specifically Pfizer and Moderna-help program the body to help recognize Covid to lessen the chance of getting sick with Covid plus also dramatically lessen the risk of a severe outcome.  These vaccines require 2 shots spaced several weeks apart.  The vaccine utilizes mRNA technology and does not interact with our DNA.  The research for these vaccines started back in the 1990s with mRNA research and technology.  The use of mRNA technology for vaccines was actually developed back during SARS infection of [place] in 2005.  Because this virus did not go worldwide there was never a need to utilize a vaccine.  The vaccine simply instructs our immune system how to recognize Covid virus thus lessening our risk of severe illness.  The studies show that vaccines are generally very well-tolerated.  It is common to have some mild side effects such as soreness at the site of the injection, for some individuals low-grade fever body aches headaches nausea diarrhea not feeling good for 2 to 3 days after the injection.  These moderate side effects are more likely to occur with the second shot.  Within 2 weeks of completing the second shot antibody levels are at a good level. Study showed that since June 2020 90% of those hospitalized for Covid were unvaccinated.  96% of those who died of Covid were unvaccinated.  The risk of dying from Covid for those who are vaccinated and under age 50 approaches 0%.  Most people who get sick with Covid will gradually get better but there is a randomness to the illness in which even younger individuals can have a severe case and end up in the hospital with Covid.  It is true that the older we are, Covid poses more risk.  Unfortunately some of the individuals who have severe cases or die from Covid have very little underlying health issues and are not considered "elderly ".1 in 7 individuals who have  Covid continue to have some symptoms of long-term Covid.  Sometimes this can be altered smell.  There are some this can result with long-term fatigue tiredness or headaches.  Also it has been shown.  Scientific studies that Covid can trigger heart attacks and strokes.  Having a Covid vaccine will not protect one against all Covid infections.  Approximately 20 to 30% of individuals who have had vaccines may get a amount of breakthrough case.  The way I look upon this-most of us drive cars that have seatbelts and in some cases airbags.  Every day we drive, we take a risk of potentially being in the traffic accident.  Having a seatbelt and airbag does not mean that we will not get in a traffic accident.  But having a seatbelt and the airbag can dramatically lower our risk of a severe outcome that could put us in the hospital or cause us to die from a auto accident.  In the same way having the Covid vaccine will lessen our risk of getting sick with Covid but if we are unlucky enough to get Covid the chance of being hospitalized or dying from it is tremendously lower.  So just as I would not feel good about disengaging my seatbelt and disabling my airbag-I would not feel good about living in a pandemic and not having the vaccine.  It is not unusual for viruses to mutate and therefore it is not unusual to have to sometimes redesign vaccines or get   a booster in order to maintain effectiveness of the vaccine.  I have had several individuals who have stated that they are not around others and so therefore they do not need to get the vaccine.  The difficult part is-this virus can infect some without triggering symptoms-therefore we can get exposed by being around a person who looks healthy-yet they are spreading the virus.  Every person is at risk of getting Covid.  I certainly recognize that many patients have difficulty deciding whether or not to do the vaccine.  Unfortunately there is a lot of information being  pushed forward on the news media, social media such as Facebook, and discussion of all friends and family that can be confusing for the average person to figure out.  It is important to realize that this information that we are exposed to falls into 3 categories. Category #1-on target information-this is typically information that is shown by scientific studies, or stated forth by experts within their fields. Category #2-misinformation-this is information that is well intended but is not an accurate reflection of what is really the case. Category #3-dis-information-this is information that is purposefully off base.  It is designed to confuse and discourage an individual from making a proper decision.  Through the past year and a half I have heard many different reasons why people do not get Covid vaccines.  Some of these reasons are a reflection of a person's deeply held beliefs.  Other reasons are based upon misinformation propagated in social media.  When making this decision it is extremely important to weed through all the noise!   Through my years of training, it has helped me to look at public health issues and treatment options from a medical perspective.  It is my firm belief that this vaccine is safe (I personally favor Moderna but Pfizer vaccine is also good).  Please do not underestimate this virus.  It is my sincere wish that all of our patients stay healthy and do not suffer tragic outcomes from this virus.  Please consider getting the vaccine if you have not already done so.  If you should have further questions please let us know.  Thanks-Dr. Daulton Harbaugh  

## 2020-04-10 NOTE — Progress Notes (Signed)
Subjective:    Patient ID: Terry [name], male    DOB: 04-07-1960, 60 y.o.   MRN: [id number]  Hypertension This is a chronic problem. The current episode started more than 1 year ago. Pertinent negatives include no chest pain, headaches or shortness of breath. Risk factors for coronary artery disease include diabetes mellitus, dyslipidemia and male gender. Treatments tried: lozol, toprolol, cozaar. There are no compliance problems.    Left shoulder bothers him a lot-worse at night- referral to ortho Left shoulder pain and discomfort ever since he was in the hospital a year ago with COVID has a lot of stiffness pain discomfort in the left shoulder radiates into the deltoid   Review of Systems  Constitutional: Negative for diaphoresis and fatigue.  HENT: Negative for congestion and rhinorrhea.   Respiratory: Negative for cough and shortness of breath.   Cardiovascular: Negative for chest pain and leg swelling.  Gastrointestinal: Negative for abdominal pain and diarrhea.  Skin: Negative for color change and rash.  Neurological: Negative for dizziness and headaches.  Psychiatric/Behavioral: Negative for behavioral problems and confusion.       Objective:   Physical Exam Vitals reviewed.  Constitutional:      General: He is not in acute distress.    Appearance: He is well-nourished.  HENT:     Head: Normocephalic and atraumatic.  Eyes:     General:        Right eye: No discharge.        Left eye: No discharge.  Neck:     Trachea: No tracheal deviation.  Cardiovascular:     Rate and Rhythm: Normal rate and regular rhythm.     Heart sounds: Normal heart sounds. No murmur heard.   Pulmonary:     Effort: Pulmonary effort is normal. No respiratory distress.     Breath sounds: Normal breath sounds.  Musculoskeletal:        General: No edema.  Lymphadenopathy:     Cervical: No cervical adenopathy.  Skin:    General: Skin is warm and dry.  Neurological:     Mental Status: He  is alert.     Coordination: Coordination normal.  Psychiatric:        Mood and Affect: Mood and affect normal.        Behavior: Behavior normal.    Decreased range of motion of the left shoulder pain and discomfort consistent with rotator cuff tendinitis and early frozen shoulder      Assessment & Plan:  1. Acute pain of left shoulder Referral to orthopedics range of motion exercises  2. Other hyperlipidemia Lab work from in the fall time look good continue current measures check labs again in 3 to 4 months follow-up visit 3 months  3. Essential hypertension, benign Blood pressure good control continue current measures  4. Encounter for long-term opiate analgesic use The patient was seen in followup for chronic pain. A review over at their current pain status was discussed. Drug registry was checked. Prescriptions were given.  Regular follow-up recommended. Discussion was held regarding the importance of compliance with medication as well as pain medication contract.  Patient was informed that medication may cause drowsiness and should not be combined  with other medications/alcohol or street drugs. If the patient feels medication is causing altered alertness then do not drive or operate dangerous equipment.  3 prescriptions were sent in Drug registry checked Patient states he is utilizing medications as prescribed  I encouraged the patient to get  his COVID-vaccine he is hesitant he is thinking about  His glucose numbers have been running high lately may need adjustments in his insulin he has a follow-up in several months but I recommend that he fax his readings to the endocrinology office and see if they can adjust his sliding scale currently to try to get his A1c better for his next visit

## 2020-04-15 ENCOUNTER — Telehealth: Payer: Self-pay | Admitting: Nurse Practitioner

## 2020-04-15 NOTE — Telephone Encounter (Signed)
Last documented medication Lantus 90 units qhs, Novolog 30-36 TIDAC,

## 2020-04-15 NOTE — Telephone Encounter (Signed)
Pt notified and agrees. 

## 2020-04-15 NOTE — Telephone Encounter (Signed)
Pt called in regards to high readings. They are listed from before breakfast, before lunch, before dinner and at bedtime  1/16 250, 248, 202, 185  1/17 225, 230, 218, 220  1/18 396, 287, 384, 264  1/19 282

## 2020-04-15 NOTE — Telephone Encounter (Signed)
Left msg for pt to call back

## 2020-04-15 NOTE — Telephone Encounter (Signed)
Please reinforce the need to follow diabetic diet (avoid sugary beverages, artificial sweeteners, sugar free products).  For now, we can increase his Lantus to 100 units.  Keep the Novolog the same 30-36 units TID with meals if glucose is above 90 and he is eating (make sure he is following the SSI parameters, as he was not adjusting his dose based on his glucose at last visit).  If glucose continues to be elevated in a week or so after changing the dose, we will need to schedule office visit to discuss changing medications.

## 2020-04-24 ENCOUNTER — Encounter: Payer: Self-pay | Admitting: Orthopedic Surgery

## 2020-04-24 ENCOUNTER — Ambulatory Visit: Payer: BC Managed Care – PPO

## 2020-04-24 ENCOUNTER — Ambulatory Visit (INDEPENDENT_AMBULATORY_CARE_PROVIDER_SITE_OTHER): Payer: BC Managed Care – PPO | Admitting: Orthopedic Surgery

## 2020-04-24 ENCOUNTER — Other Ambulatory Visit: Payer: Self-pay

## 2020-04-24 VITALS — BP 171/90 | HR 90 | Ht 70.0 in | Wt 206.0 lb

## 2020-04-24 DIAGNOSIS — G8929 Other chronic pain: Secondary | ICD-10-CM | POA: Diagnosis not present

## 2020-04-24 DIAGNOSIS — M7502 Adhesive capsulitis of left shoulder: Secondary | ICD-10-CM | POA: Diagnosis not present

## 2020-04-24 DIAGNOSIS — M25512 Pain in left shoulder: Secondary | ICD-10-CM | POA: Diagnosis not present

## 2020-04-24 NOTE — Patient Instructions (Signed)

## 2020-04-24 NOTE — Progress Notes (Signed)
New Patient Visit  Assessment: Terry [name] is a 60 y.o. LHD male with the following: Left shoulder adhesive capsulitis   Plan: Significant pain and restricted ROM.  Description of pain and physical exam consistent with frozen shoulder.  Recommended injection and exercises.  He has elected to proceed.  He is not interested in formal physical therapy at this time, but would prefer to complete exercises at home.  I have advised him to take over-the-counter pain medications including Tylenol or NSAIDs as needed.  If interested, we can provide him with a prescription medication.  The condition, and anticipated course were discussed.  All questions were answered.  Follow-up as needed.   Procedure note injection - Left Shoulder joint (subacromial)   Verbal consent was obtained to inject the Left Shoulder joint  Timeout was completed to confirm the site of injection.  The skin was prepped with alcohol and ethyl chloride was sprayed at the injection site.  A 21-gauge needle was used to inject 40 mg of Depo-Medrol and 1% lidocaine (3 cc) into the Right Shoulder (subacromial space) using a Posterolateral approach.  There were no complications. A sterile bandage was applied.    Follow-up: Return if symptoms worsen or fail to improve.  Subjective:  Chief Complaint  Patient presents with  . Shoulder Pain    Left shoulder x 1 year, pt reports worse since he had covid last year,    History of Present Illness: Terry [name] is a 60 y.o. LHD male who has been referred to clinic today by Sallee Lange, MD for evaluation of his left shoulder.  He denies a specific injury.  However, his left shoulder has been bothering him for about a year.  His pain has been present ever since he had Covid.  He spent 7 days in the hospital.  Since then he has had significant pain and difficulty with range of motion.  He does not take any medications on a regular basis.  He works in [employer], and  notes significant pain and difficulty with some of these activities.  He felt his pain was improving, but recently it started to get worse.  He has not had an injection, nor had any physical therapy.  Of note, he is a diabetic, and has had some difficulty with his blood sugar levels recently.   Review of Systems: No fevers or chills No numbness or tingling No chest pain No shortness of breath No bowel or bladder dysfunction No GI distress No headaches   Medical History:  Past Medical History:  Diagnosis Date  . Diabetes mellitus without complication ([place]) 6237  . Hyperlipidemia   . Hypertension   . Osteoarthritis     Past Surgical History:  Procedure Laterality Date  . ANKLE SURGERY Left 2007   2 plates/screws  . COLONOSCOPY N/A 08/04/2017   Procedure: COLONOSCOPY;  Surgeon: Rogene Houston, MD;  Location: [facility];  Service: Endoscopy;  Laterality: N/A;  . ESOPHAGEAL DILATION N/A 08/04/2017   Procedure: ESOPHAGEAL DILATION;  Surgeon: Rogene Houston, MD;  Location: [facility];  Service: Endoscopy;  Laterality: N/A;  . ESOPHAGOGASTRODUODENOSCOPY N/A 08/04/2017   Procedure: ESOPHAGOGASTRODUODENOSCOPY (EGD);  Surgeon: Rogene Houston, MD;  Location: [facility];  Service: Endoscopy;  Laterality: N/A;  . POLYPECTOMY  08/04/2017   Procedure: POLYPECTOMY;  Surgeon: Rogene Houston, MD;  Location: [facility];  Service: Endoscopy;;    Family History  Problem Relation Age of Onset  . Hypertension  Mother   . Diabetes Mother   . Heart disease Mother   . Hyperlipidemia Mother   . Cancer Father        lung   Social History   Tobacco Use  . Smoking status: Former Smoker    Quit date: 06/15/1992    Years since quitting: 27.8  . Smokeless tobacco: Never Used  Vaping Use  . Vaping Use: Never used  Substance Use Topics  . Alcohol use: Never  . Drug use: Not Currently    Types: Marijuana    Comment: last used 1970    Allergies  Allergen Reactions  . Invokana  [Canagliflozin] Other (See Comments)    laryngitis  . Altace [Ramipril] Cough  . Metformin And Related Diarrhea    Current Meds  Medication Sig  . ascorbic acid (VITAMIN C) 500 MG tablet Take 1 tablet (500 mg total) by mouth daily.  Marland Kitchen aspirin EC 81 MG tablet Take 1 tablet (81 mg total) by mouth daily with breakfast.  . Continuous Blood Gluc Sensor (DEXCOM G6 SENSOR) MISC Apply 1 sensor every 10 days for glucose monitoring  . Continuous Blood Gluc Transmit (DEXCOM G6 TRANSMITTER) MISC 1 Piece by Does not apply route as directed.  . CONTOUR NEXT TEST test strip TEST BLOOD SUGAR 4 TIMES DAILY.  . indapamide (LOZOL) 2.5 MG tablet TAKE ONE TABLET BY MOUTH ONCE DAILY.  Marland Kitchen insulin aspart (NOVOLOG FLEXPEN) 100 UNIT/ML FlexPen Inject 30-36 Units into the skin 3 (three) times daily with meals.  . insulin glargine (LANTUS SOLOSTAR) 100 UNIT/ML Solostar Pen Inject 90 Units into the skin at bedtime.  Marland Kitchen losartan (COZAAR) 100 MG tablet Take 1 tablet (100 mg total) by mouth daily.  . metoprolol succinate (TOPROL-XL) 100 MG 24 hr tablet TAKE (1) TABLET BY MOUTH ONCE DAILY.  . mometasone (ELOCON) 0.1 % cream APPLY TO AFFECTED AREAS TWICE DAILY AS NEEDED.  . Multiple Vitamin (MULTIVITAMIN) tablet Take 1 tablet by mouth daily.  Marland Kitchen oxyCODONE-acetaminophen (PERCOCET) 10-325 MG tablet Take one tablet po 4 times daily prn pain  . pantoprazole (PROTONIX) 40 MG tablet Take 1 tablet (40 mg total) by mouth 2 (two) times daily. TAKE 1 TABLET BY MOUTH TWICE DAILY BEFORE A MEAL.  . rosuvastatin (CRESTOR) 40 MG tablet TAKE ONE TABLET BY MOUTH ONCE DAILY.  Marland Kitchen saccharomyces boulardii (FLORASTOR) 250 MG capsule Take 1 capsule (250 mg total) by mouth 2 (two) times daily.  . sertraline (ZOLOFT) 50 MG tablet 1 qd  . sildenafil (REVATIO) 20 MG tablet Take 1-5 tablets before sexual activity as directed. No greater that 5 tablets in a day.  . triamcinolone cream (KENALOG) 0.1 % USE SMALL DABS TID  . zinc sulfate 220 (50 Zn) MG  capsule Take 1 capsule (220 mg total) by mouth daily.    Objective: BP (!) 171/90   Pulse 90   Ht 5\' 10"  (1.778 m)   Wt 206 lb (93.4 kg)   BMI 29.56 kg/m   Physical Exam:  General:  Alert and oriented, no acute distress Gait:  Normal  Evaluation of the left shoulder demonstrates no deformity.  No atrophy is appreciated.  He does have limited range of motion.  110 degrees of forward flexion.  Abduction limited to 80 degrees.  External rotation is side limited to 20 degrees.  Internal rotation to his back pocket.  Strength is likewise affected.  4+/5 supraspinatus, infraspinatus.  Negative belly press.  Sensation is intact distal     IMAGING: I personally ordered  and reviewed the following images   X-rays of the left shoulder demonstrates minimal glenohumeral degenerative changes.  Normal humeral height.  No proximal humeral migration is appreciated.  There does appear to be a small amount of calcium deposit just lateral to the edge of the acromion.  No acute injuries are noted.  Impression: Normal-appearing left shoulder   New Medications:  No orders of the defined types were placed in this encounter.     Mordecai Rasmussen, MD  04/24/2020 9:50 AM

## 2020-05-05 ENCOUNTER — Telehealth: Payer: Self-pay

## 2020-05-05 DIAGNOSIS — E119 Type 2 diabetes mellitus without complications: Secondary | ICD-10-CM

## 2020-05-05 MED ORDER — LANTUS SOLOSTAR 100 UNIT/ML ~~LOC~~ SOPN: 100.0000 [IU] | PEN_INJECTOR | Freq: Every day | SUBCUTANEOUS | 0 refills | Status: DC

## 2020-05-05 NOTE — Telephone Encounter (Signed)
Rx sent in

## 2020-05-05 NOTE — Telephone Encounter (Signed)
Pt requesting refill on insulin glargine (LANTUS SOLOSTAR) 100 UNIT/ML Solostar Pen. He uses [facility]

## 2020-05-07 MED ORDER — HUMULIN R U-500 KWIKPEN 500 UNIT/ML ~~LOC~~ SOPN: 50.0000 [IU] | PEN_INJECTOR | Freq: Three times a day (TID) | SUBCUTANEOUS | 2 refills | Status: DC

## 2020-05-07 NOTE — Addendum Note (Signed)
Addended by: Brita Romp on: 05/07/2020 02:40 PM   Modules accepted: Orders

## 2020-05-07 NOTE — Telephone Encounter (Signed)
Pt is needing U 500 called in as he states was discussed with Loree Fee it would be changing once he ran out of the Lantus.    [facility], [place] Phone:  [phone]  Fax:  [phone]

## 2020-05-07 NOTE — Telephone Encounter (Signed)
Patient notified and verbalized understanding. 

## 2020-06-08 ENCOUNTER — Encounter: Payer: Self-pay | Admitting: Nurse Practitioner

## 2020-06-08 ENCOUNTER — Other Ambulatory Visit: Payer: Self-pay

## 2020-06-08 ENCOUNTER — Ambulatory Visit: Payer: BC Managed Care – PPO | Admitting: Nurse Practitioner

## 2020-06-08 VITALS — BP 161/80 | HR 75 | Ht 70.0 in | Wt 205.0 lb

## 2020-06-08 DIAGNOSIS — E782 Mixed hyperlipidemia: Secondary | ICD-10-CM | POA: Diagnosis not present

## 2020-06-08 DIAGNOSIS — I10 Essential (primary) hypertension: Secondary | ICD-10-CM

## 2020-06-08 DIAGNOSIS — E119 Type 2 diabetes mellitus without complications: Secondary | ICD-10-CM

## 2020-06-08 LAB — POCT GLYCOSYLATED HEMOGLOBIN (HGB A1C): HbA1c, POC (controlled diabetic range): 8.6 % — AB (ref 0.0–7.0)

## 2020-06-08 MED ORDER — HUMULIN R U-500 KWIKPEN 500 UNIT/ML ~~LOC~~ SOPN: 60.0000 [IU] | PEN_INJECTOR | Freq: Three times a day (TID) | SUBCUTANEOUS | 2 refills | Status: DC

## 2020-06-08 NOTE — Patient Instructions (Signed)

## 2020-06-08 NOTE — Progress Notes (Signed)
Endocrinology Follow Up Visit      06/08/2020, 8:44 AM   Subjective:    Patient ID: Terry [name], male    DOB: November 17, 1960.  Terry [name] is being seen in follow up after being seen in consultation for management of currently uncontrolled symptomatic diabetes requested by  Kathyrn Drown, MD.   Past Medical History:  Diagnosis Date  . Diabetes mellitus without complication ([place]) 1308  . Hyperlipidemia   . Hypertension   . Osteoarthritis     Past Surgical History:  Procedure Laterality Date  . ANKLE SURGERY Left 2007   2 plates/screws  . COLONOSCOPY N/A 08/04/2017   Procedure: COLONOSCOPY;  Surgeon: Rogene Houston, MD;  [facility];  Service: Endoscopy;  Laterality: N/A;  . ESOPHAGEAL DILATION N/A 08/04/2017   Procedure: ESOPHAGEAL DILATION;  Surgeon: Rogene Houston, MD;  [facility];  Service: Endoscopy;  Laterality: N/A;  . ESOPHAGOGASTRODUODENOSCOPY N/A 08/04/2017   Procedure: ESOPHAGOGASTRODUODENOSCOPY (EGD);  Surgeon: Rogene Houston, MD;  [facility];  Service: Endoscopy;  Laterality: N/A;  . POLYPECTOMY  08/04/2017   Procedure: POLYPECTOMY;  Surgeon: Rogene Houston, MD;  [facility]: [facility];  Service: Endoscopy;;    Social History   Socioeconomic History  . Marital status: Married    Spouse name: Not on file  . Number of children: Not on file  . Years of education: Not on file  . Highest education level: Not on file  Occupational History  . Not on file  Tobacco Use  . Smoking status: Former Smoker    Quit date: 06/15/1992    Years since quitting: 28.0  . Smokeless tobacco: Never Used  Vaping Use  . Vaping Use: Never used  Substance and Sexual Activity  . Alcohol use: Never  . Drug use: Not Currently    Types: Marijuana    Comment: last used 1970  . Sexual activity: Not on file  Other Topics Concern  . Not on file  Social History Narrative   . Not on file   Social Determinants of Health   Financial Resource Strain: Not on file  Food Insecurity: Not on file  Transportation Needs: Not on file  Physical Activity: Not on file  Stress: Not on file  Social Connections: Not on file    Family History  Problem Relation Age of Onset  . Hypertension Mother   . Diabetes Mother   . Heart disease Mother   . Hyperlipidemia Mother   . Cancer Father        lung    Outpatient Encounter Medications as of 06/08/2020  Medication Sig  . amLODipine (NORVASC) 10 MG tablet Take 1 tablet (10 mg total) by mouth daily.  Marland Kitchen ascorbic acid (VITAMIN C) 500 MG tablet Take 1 tablet (500 mg total) by mouth daily.  Marland Kitchen aspirin EC 81 MG tablet Take 1 tablet (81 mg total) by mouth daily with breakfast.  . Continuous Blood Gluc Sensor (DEXCOM G6 SENSOR) MISC Apply 1 sensor every 10 days for glucose monitoring  . Continuous Blood Gluc Transmit (DEXCOM G6 TRANSMITTER) MISC 1 Piece by Does not apply route as directed.  [place] NEXT  TEST test strip TEST BLOOD SUGAR 4 TIMES DAILY.  . indapamide (LOZOL) 2.5 MG tablet TAKE ONE TABLET BY MOUTH ONCE DAILY.  Marland Kitchen losartan (COZAAR) 100 MG tablet Take 1 tablet (100 mg total) by mouth daily.  . metoprolol succinate (TOPROL-XL) 100 MG 24 hr tablet TAKE (1) TABLET BY MOUTH ONCE DAILY.  . mometasone (ELOCON) 0.1 % cream APPLY TO AFFECTED AREAS TWICE DAILY AS NEEDED.  . Multiple Vitamin (MULTIVITAMIN) tablet Take 1 tablet by mouth daily.  Marland Kitchen oxyCODONE-acetaminophen (PERCOCET) 10-325 MG tablet Take one tablet po 4 times daily prn pain  . pantoprazole (PROTONIX) 40 MG tablet Take 1 tablet (40 mg total) by mouth 2 (two) times daily. TAKE 1 TABLET BY MOUTH TWICE DAILY BEFORE A MEAL.  . rosuvastatin (CRESTOR) 40 MG tablet TAKE ONE TABLET BY MOUTH ONCE DAILY.  Marland Kitchen saccharomyces boulardii (FLORASTOR) 250 MG capsule Take 1 capsule (250 mg total) by mouth 2 (two) times daily.  . sildenafil (REVATIO) 20 MG tablet Take 1-5 tablets  before sexual activity as directed. No greater that 5 tablets in a day.  . triamcinolone cream (KENALOG) 0.1 % USE SMALL DABS TID  . zinc sulfate 220 (50 Zn) MG capsule Take 1 capsule (220 mg total) by mouth daily.  . [DISCONTINUED] insulin regular human CONCENTRATED (HUMULIN R U-500 KWIKPEN) 500 UNIT/ML kwikpen Inject 50 Units into the skin 3 (three) times daily with meals.  . insulin regular human CONCENTRATED (HUMULIN R U-500 KWIKPEN) 500 UNIT/ML kwikpen Inject 60 Units into the skin 3 (three) times daily with meals.  . [DISCONTINUED] sertraline (ZOLOFT) 50 MG tablet 1 qd   No facility-administered encounter medications on file as of 06/08/2020.    ALLERGIES: Allergies  Allergen Reactions  . Invokana [Canagliflozin] Other (See Comments)    laryngitis  . Altace [Ramipril] Cough  . Metformin And Related Diarrhea    VACCINATION STATUS: Immunization History  Administered Date(s) Administered  . Influenza,inj,Quad PF,6+ Mos 01/30/2014, 01/29/2016, 01/30/2017, 01/26/2018, 01/23/2019, 01/17/2020  . Influenza-Unspecified 01/26/2012, 02/21/2015  . Pneumococcal Polysaccharide-23 01/30/2014  . Td 12/19/2014    Diabetes He presents for his follow-up diabetic visit. He has type 2 diabetes mellitus. Onset time: Diagnosed at approximate age of 53. His disease course has been improving. Hypoglycemia symptoms include nervousness/anxiousness, sweats and tremors. Associated symptoms include fatigue. Pertinent negatives for diabetes include no blurred vision, no polydipsia, no polyphagia and no polyuria. There are no hypoglycemic complications. Symptoms are improving. Diabetic complications include nephropathy. Risk factors for coronary artery disease include diabetes mellitus, dyslipidemia, hypertension, male sex and obesity. Current diabetic treatment includes intensive insulin program. He is compliant with treatment most of the time. His weight is fluctuating minimally. He is following a diabetic diet.  When asked about meal planning, he reported none. He has not had a previous visit with a dietitian. He participates in exercise intermittently. His home blood glucose trend is fluctuating minimally. His breakfast blood glucose range is generally >200 mg/dl. His lunch blood glucose range is generally >200 mg/dl. His dinner blood glucose range is generally 180-200 mg/dl. His bedtime blood glucose range is generally >200 mg/dl. (He presents today with his CGM and logs showing improved, yet still above target fasting and postprandial glycemic profile overall.  His POCT A1c today is 8.6%, improving from last visit of 10.1%.  He was switched from basal/bolus to U-500 in between visits.  He has had rare occasions where his glucose was in the 80-90 range and he did have hypoglycemia symptoms.  He does continue  to drink diet soda.) An ACE inhibitor/angiotensin II receptor blocker is being taken. He does not see a podiatrist.Eye exam is not current.  Hypertension This is a chronic problem. The current episode started more than 1 year ago. The problem has been waxing and waning since onset. The problem is uncontrolled. Associated symptoms include sweats. Pertinent negatives include no blurred vision. There are no associated agents to hypertension. Risk factors for coronary artery disease include diabetes mellitus, dyslipidemia, male gender and obesity. Past treatments include angiotensin blockers and calcium channel blockers. The current treatment provides mild improvement. Compliance problems include diet and exercise.  Hypertensive end-organ damage includes kidney disease. Identifiable causes of hypertension include chronic renal disease.  Hyperlipidemia This is a chronic problem. The current episode started more than 1 year ago. The problem is uncontrolled. Recent lipid tests were reviewed and are variable. Exacerbating diseases include chronic renal disease. Factors aggravating his hyperlipidemia include fatty foods.  Current antihyperlipidemic treatment includes statins. The current treatment provides mild improvement of lipids. Compliance problems include adherence to diet and adherence to exercise.  Risk factors for coronary artery disease include diabetes mellitus, dyslipidemia, hypertension, male sex and obesity.    Review of systems  Constitutional: + Minimally fluctuating body weight,  current Body mass index is 29.41 kg/m. , + fatigue (improving), no subjective hyperthermia, no subjective hypothermia Eyes: no blurry vision, no xerophthalmia ENT: no sore throat, no nodules palpated in throat, no dysphagia/odynophagia, no hoarseness Cardiovascular: no chest pain, no shortness of breath, no palpitations, no leg swelling Respiratory: no cough, no shortness of breath Gastrointestinal: no nausea/vomiting/diarrhea Genitourinary: +polyuria (night-time)- improving Musculoskeletal: no muscle/joint aches Skin: no rashes, no hyperemia,  Neurological: no tremors, no numbness, no tingling, no dizziness Psychiatric: no depression, no anxiety   Objective:     BP (!) 161/80 (BP Location: Left Arm, Patient Position: Sitting)   Pulse 75   Ht 5\' 10"  (1.778 m)   Wt 205 lb (93 kg)   BMI 29.41 kg/m   Wt Readings from Last 3 Encounters:  06/08/20 205 lb (93 kg)  04/24/20 206 lb (93.4 kg)  04/10/20 205 lb 6.4 oz (93.2 kg)    BP Readings from Last 3 Encounters:  06/08/20 (!) 161/80  04/24/20 (!) 171/90  04/10/20 138/86      Physical Exam- Limited  Constitutional:  Body mass index is 29.41 kg/m. , not in acute distress, normal state of mind Eyes:  EOMI, no exophthalmos Neck: Supple Cardiovascular: RRR, no murmers, rubs, or gallops, no edema Respiratory: Adequate breathing efforts, no crackles, rales, rhonchi, or wheezing Musculoskeletal: no gross deformities, strength intact in all four extremities, no gross restriction of joint movements Skin:  no rashes, no hyperemia Neurological: no tremor with  outstretched hands       CMP ( most recent) CMP     Component Value Date/Time   NA 136 01/17/2020 0928   K 4.7 01/17/2020 0928   CL 94 (L) 01/17/2020 0928   CO2 25 01/17/2020 0928   GLUCOSE 313 (H) 01/17/2020 0928   GLUCOSE 156 (H) 04/10/2019 0430   BUN 31 (H) 01/17/2020 0928   CREATININE 1.36 (H) 01/17/2020 0928   CREATININE 0.91 02/25/2014 0727   CALCIUM 10.1 01/17/2020 0928   PROT 7.8 10/08/2019 0844   ALBUMIN 5.2 (H) 10/08/2019 0844   AST 24 10/08/2019 0844   ALT 36 10/08/2019 0844   ALKPHOS 54 10/08/2019 0844   BILITOT 0.4 10/08/2019 0844   GFRNONAA 57 (L) 01/17/2020 0928   GFRAA  66 01/17/2020 0928     Diabetic Labs (most recent): Lab Results  Component Value Date   HGBA1C 8.6 (A) 06/08/2020   HGBA1C 10.1 (H) 01/17/2020   HGBA1C 7.8 (A) 07/15/2019     Lipid Panel ( most recent) Lipid Panel     Component Value Date/Time   CHOL 185 10/08/2019 0844   TRIG 450 (H) 10/08/2019 0844   HDL 34 (L) 10/08/2019 0844   CHOLHDL 5.4 (H) 10/08/2019 0844   CHOLHDL 4.4 02/25/2014 0727   VLDL 35 02/25/2014 0727   LDLCALC 79 10/08/2019 0844   LABVLDL 72 (H) 10/08/2019 0844      Lab Results  Component Value Date   TSH 1.470 11/11/2019   TSH 2.144 04/04/2019   TSH 0.986 07/01/2015   FREET4 1.44 11/11/2019           Assessment & Plan:   1) DM type 2 without retinopathy ([place])  - Terry [name] has currently uncontrolled symptomatic type 2 DM since  60 years of age.   Recent labs reviewed.  He presents today with his CGM and logs showing improved, yet still above target fasting and postprandial glycemic profile overall.  His POCT A1c today is 8.6%, improving from last visit of 10.1%.  He was switched from basal/bolus to U-500 in between visits.  He has had rare occasions where his glucose was in the 80-90 range and he did have hypoglycemia symptoms.  He does continue to drink diet soda.  - I had a long discussion with him about the progressive nature of  diabetes and the pathology behind its complications. -his diabetes is complicated by CKD stage 3 and he remains at a high risk for more acute and chronic complications which include CAD, CVA, CKD, retinopathy, and neuropathy. These are all discussed in detail with him.  - Nutritional counseling repeated at each appointment due to patients tendency to fall back in to old habits.  - The patient admits there is a room for improvement in their diet and drink choices. -  Suggestion is made for the patient to avoid simple carbohydrates from their diet including Cakes, Sweet Desserts / Pastries, Ice Cream, Soda (diet and regular), Sweet Tea, Candies, Chips, Cookies, Sweet Pastries,  Store Bought Juices, Alcohol in Excess of  1-2 drinks a day, Artificial Sweeteners, Coffee Creamer, and "Sugar-free" Products. This will help patient to have stable blood glucose profile and potentially avoid unintended weight gain.   - I encouraged the patient to switch to  unprocessed or minimally processed complex starch and increased protein intake (animal or plant source), fruits, and vegetables.   - Patient is advised to stick to a routine mealtimes to eat 3 meals  a day and avoid unnecessary snacks ( to snack only to correct hypoglycemia).  - I have approached him with the following individualized plan to manage  his diabetes and patient agrees:   -Given his continued hyperglycemia, he will tolerate increase in his Humulin R-U500 to 60 units TID with meals if glucose is above 90 and he is eating.   -He is encouraged to continue using his CGM to monitor glucose 4 times daily, before meals and before bed, and to call the clinic if he has readings less than 70 or greater than 300 for 3 tests in a row.  - he is warned not to take insulin without proper monitoring per orders.  -He is not a candidate for incretin therapy at this time due to elevated triglycerides increasing his  risk for pancreatitis.  - Specific targets  for  A1c;  LDL, HDL,  and Triglycerides were discussed with the patient.  2) Blood Pressure /Hypertension: His blood pressure is still not controlled to target.  He says his BP is typically high in doctors offices due to white coat syndrome.  He is advised to continue Cozaar 100 mg po daily, Indapamide 2.5 mg po daily, and Metoprolol 100 mg po daily.      3) Lipids/Hyperlipidemia:    His most recent lipid panel from 10/08/19 shows controlled LDL at 79 and elevated triglycerides of 450.  He is advised to continue Crestor 40 mg po daily at bedtime.  Side effects and precautions discussed with him.  Will recheck lipid panel prior to next visit.  4)  Weight/Diet:  His Body mass index is 29.41 kg/m.  -   he is a candidate for weight loss. I discussed with him the fact that loss of 5 - 10% of his  current body weight will have the most impact on his diabetes management.  Exercise, and detailed carbohydrates information provided  -  detailed on discharge instructions.  5) Chronic Care/Health Maintenance: -he on ACEI/ARB and Statin medications and  is encouraged to initiate and continue to follow up with Ophthalmology, Dentist,  Podiatrist at least yearly or according to recommendations, and advised to stay away from smoking. I have recommended yearly flu vaccine and pneumonia vaccine at least every 5 years; moderate intensity exercise for up to 150 minutes weekly; and  sleep for at least 7 hours a day.  - he is  advised to maintain close follow up with Kathyrn Drown, MD for primary care needs, as well as his other providers for optimal and coordinated care.   - Time spent on this patient care encounter:  40 min, of which > 50% was spent in  counseling and the rest reviewing his blood glucose logs , discussing his hypoglycemia and hyperglycemia episodes, reviewing his current and  previous labs / studies  ( including abstraction from other facilities) and medications  doses and developing a  long term  treatment plan and documenting his care.   Please refer to Patient Instructions for Blood Glucose Monitoring and Insulin/Medications Dosing Guide"  in media tab for additional information. Please  also refer to " Patient Self Inventory" in the Media  tab for reviewed elements of pertinent patient history.  Terry [name] participated in the discussions, expressed understanding, and voiced agreement with the above plans.  All questions were answered to his satisfaction. he is encouraged to contact clinic should he have any questions or concerns prior to his return visit.   Follow up plan: - Return in about 3 months (around 09/08/2020) for Diabetes follow up with A1c in office, Previsit labs, Bring glucometer and logs.  Rayetta Pigg, [facility] [address] [place], [place] [address] Phone: [phone] Fax: [phone]  06/08/2020, 8:44 AM

## 2020-06-12 ENCOUNTER — Other Ambulatory Visit: Payer: Self-pay | Admitting: Family Medicine

## 2020-06-29 ENCOUNTER — Encounter: Payer: Self-pay | Admitting: Family Medicine

## 2020-06-29 ENCOUNTER — Other Ambulatory Visit: Payer: Self-pay

## 2020-06-29 ENCOUNTER — Ambulatory Visit (INDEPENDENT_AMBULATORY_CARE_PROVIDER_SITE_OTHER): Payer: BC Managed Care – PPO | Admitting: Family Medicine

## 2020-06-29 VITALS — BP 148/82 | Temp 98.1°F | Wt 202.2 lb

## 2020-06-29 DIAGNOSIS — E785 Hyperlipidemia, unspecified: Secondary | ICD-10-CM

## 2020-06-29 DIAGNOSIS — E1129 Type 2 diabetes mellitus with other diabetic kidney complication: Secondary | ICD-10-CM

## 2020-06-29 DIAGNOSIS — G8929 Other chronic pain: Secondary | ICD-10-CM

## 2020-06-29 DIAGNOSIS — F325 Major depressive disorder, single episode, in full remission: Secondary | ICD-10-CM

## 2020-06-29 DIAGNOSIS — K219 Gastro-esophageal reflux disease without esophagitis: Secondary | ICD-10-CM

## 2020-06-29 DIAGNOSIS — I10 Essential (primary) hypertension: Secondary | ICD-10-CM

## 2020-06-29 DIAGNOSIS — E119 Type 2 diabetes mellitus without complications: Secondary | ICD-10-CM | POA: Diagnosis not present

## 2020-06-29 DIAGNOSIS — E782 Mixed hyperlipidemia: Secondary | ICD-10-CM | POA: Insufficient documentation

## 2020-06-29 DIAGNOSIS — M25572 Pain in left ankle and joints of left foot: Secondary | ICD-10-CM

## 2020-06-29 DIAGNOSIS — R1319 Other dysphagia: Secondary | ICD-10-CM

## 2020-06-29 DIAGNOSIS — Z79891 Long term (current) use of opiate analgesic: Secondary | ICD-10-CM

## 2020-06-29 DIAGNOSIS — M1990 Unspecified osteoarthritis, unspecified site: Secondary | ICD-10-CM

## 2020-06-29 DIAGNOSIS — E1169 Type 2 diabetes mellitus with other specified complication: Secondary | ICD-10-CM

## 2020-06-29 DIAGNOSIS — R809 Proteinuria, unspecified: Secondary | ICD-10-CM

## 2020-06-29 MED ORDER — OXYCODONE-ACETAMINOPHEN 10-325 MG PO TABS: ORAL_TABLET | ORAL | 0 refills | Status: DC

## 2020-06-29 MED ORDER — ROSUVASTATIN CALCIUM 40 MG PO TABS: 40.0000 mg | ORAL_TABLET | Freq: Every day | ORAL | 1 refills | Status: DC

## 2020-06-29 MED ORDER — METOPROLOL SUCCINATE ER 100 MG PO TB24: ORAL_TABLET | ORAL | 1 refills | Status: DC

## 2020-06-29 MED ORDER — INDAPAMIDE 2.5 MG PO TABS: 2.5000 mg | ORAL_TABLET | Freq: Every day | ORAL | 1 refills | Status: DC

## 2020-06-29 MED ORDER — PANTOPRAZOLE SODIUM 40 MG PO TBEC: 40.0000 mg | DELAYED_RELEASE_TABLET | Freq: Two times a day (BID) | ORAL | 1 refills | Status: DC

## 2020-06-29 MED ORDER — LOSARTAN POTASSIUM 100 MG PO TABS: 100.0000 mg | ORAL_TABLET | Freq: Every day | ORAL | 1 refills | Status: DC

## 2020-06-29 NOTE — Progress Notes (Signed)
Subjective:    Patient ID: Terry [name], male    DOB: [birth year], 60 y.o.   MRN: [id number]  HPI This patient was seen today for chronic pain  The medication list was reviewed and updated.  Location of Pain for which the patient has been treated with regarding narcotics: left ankle, both hips and both hands   Onset of this pain: years ago (8-9 years)   -Compliance with medication: Oxycodone 10-325  - Number patient states they take daily: 4  -when was the last dose patient took? Last night   The patient was advised the importance of maintaining medication and not using illegal substances with these.  Here for refills and follow up  The patient was educated that we can provide 3 monthly scripts for their medication, it is their responsibility to follow the instructions.  Side effects or complications from medications: none  Patient is aware that pain medications are meant to minimize the severity of the pain to allow their pain levels to improve to allow for better function. They are aware of that pain medications cannot totally remove their pain.  Due for UDT ( at least once per year) : 10/14/19  Scale of 1 to 10 ( 1 is least 10 is most) Your pain level without the medicine: 9 Your pain level with medication:6  Scale 1 to 10 ( 1-helps very little, 10 helps very well) How well does your pain medication reduce your pain so you can function better through out the day? 10  Quality of the pain: "like a toothache", throbs   Persistence of the pain: constant   Modifying factors: pain meds helps Rest helps  Patient for blood pressure check up.  The patient does have hypertension.  The patient is on medication.  Patient relates compliance with meds. Todays BP reviewed with the patient. Patient denies issues with medication. Patient relates reasonable diet. Patient tries to minimize salt. Patient aware of BP goals.  Patient here for follow-up regarding cholesterol.  The patient  does have hyperlipidemia.  Patient does try to maintain a reasonable diet.  Patient does take the medication on a regular basis.  Denies missing a dose.  The patient denies any obvious side effects.  Prior blood work results reviewed with the patient.  The patient is aware of his cholesterol goals and the need to keep it under good control to lessen the risk of disease.        Review of Systems  Constitutional: Negative for activity change.  HENT: Negative for congestion and rhinorrhea.   Respiratory: Negative for cough and shortness of breath.   Cardiovascular: Negative for chest pain.  Gastrointestinal: Negative for abdominal pain, diarrhea, nausea and vomiting.  Genitourinary: Negative for dysuria and hematuria.  Musculoskeletal: Positive for arthralgias and back pain.  Neurological: Negative for weakness and headaches.  Psychiatric/Behavioral: Negative for behavioral problems and confusion.       Objective:   Physical Exam Vitals reviewed.  Cardiovascular:     Rate and Rhythm: Normal rate and regular rhythm.     Heart sounds: Normal heart sounds. No murmur heard.   Pulmonary:     Effort: Pulmonary effort is normal.     Breath sounds: Normal breath sounds.  Lymphadenopathy:     Cervical: No cervical adenopathy.  Neurological:     Mental Status: He is alert.  Psychiatric:        Behavior: Behavior normal.           Assessment &  Plan:  The patient was seen in followup for chronic pain. A review over at their current pain status was discussed. Drug registry was checked. Prescriptions were given.  Regular follow-up recommended. Discussion was held regarding the importance of compliance with medication as well as pain medication contract.  Patient was informed that medication may cause drowsiness and should not be combined  with other medications/alcohol or street drugs. If the patient feels medication is causing altered alertness then do not drive or operate dangerous  equipment.  Drug registry was checked 3 scripts sent in Patient does state that the pain medicine allows him to function better without having major setbacks He does have osteoarthritis of the hands as well as hip and ankle pain Because of his kidney function he cannot take anti-inflammatories Plain Tylenol alone does not do enough  Diabetes under the care of endocrinology  Blood pressure slight elevation.  I would like for the patient to check his blood pressures at home over the course the next few weeks send [place] some readings Follow-up in approximately 4 to 6 weeks to recheck blood pressure May need to adjust medicines If blood pressure readings at home look good that could suffice  Continue cholesterol medicine check labs via endocrinology Depression resolved  Reflux under good control denies dysphagia currently Continue ARB and healthy diet and other medications

## 2020-06-30 ENCOUNTER — Telehealth: Payer: Self-pay | Admitting: Family Medicine

## 2020-06-30 ENCOUNTER — Other Ambulatory Visit: Payer: Self-pay

## 2020-06-30 DIAGNOSIS — E782 Mixed hyperlipidemia: Secondary | ICD-10-CM

## 2020-06-30 LAB — TSH: TSH: 1.28 u[IU]/mL (ref 0.450–4.500)

## 2020-06-30 LAB — COMPREHENSIVE METABOLIC PANEL
ALT: 44 IU/L (ref 0–44)
AST: 25 IU/L (ref 0–40)
Albumin/Globulin Ratio: 1.8 (ref 1.2–2.2)
Albumin: 5 g/dL — ABNORMAL HIGH (ref 3.8–4.9)
Alkaline Phosphatase: 53 IU/L (ref 44–121)
BUN/Creatinine Ratio: 22 — ABNORMAL HIGH (ref 9–20)
BUN: 25 mg/dL — ABNORMAL HIGH (ref 6–24)
Bilirubin Total: 0.4 mg/dL (ref 0.0–1.2)
CO2: 22 mmol/L (ref 20–29)
Calcium: 10.3 mg/dL — ABNORMAL HIGH (ref 8.7–10.2)
Chloride: 95 mmol/L — ABNORMAL LOW (ref 96–106)
Creatinine, Ser: 1.15 mg/dL (ref 0.76–1.27)
Globulin, Total: 2.8 g/dL (ref 1.5–4.5)
Glucose: 304 mg/dL — ABNORMAL HIGH (ref 65–99)
Potassium: 4.5 mmol/L (ref 3.5–5.2)
Sodium: 137 mmol/L (ref 134–144)
Total Protein: 7.8 g/dL (ref 6.0–8.5)
eGFR: 74 mL/min/{1.73_m2} (ref >59)

## 2020-06-30 LAB — LIPID PANEL
Chol/HDL Ratio: 7.8 ratio — ABNORMAL HIGH (ref 0.0–5.0)
Cholesterol, Total: 210 mg/dL — ABNORMAL HIGH (ref 100–199)
HDL: 27 mg/dL — ABNORMAL LOW (ref >39)
Triglycerides: 1002 mg/dL (ref 0–149)

## 2020-06-30 LAB — T4, FREE: Free T4: 1.39 ng/dL (ref 0.82–1.77)

## 2020-06-30 LAB — VITAMIN D 25 HYDROXY (VIT D DEFICIENCY, FRACTURES): Vit D, 25-Hydroxy: 20.9 ng/mL — ABNORMAL LOW (ref 30.0–100.0)

## 2020-06-30 MED ORDER — FENOFIBRATE 160 MG PO TABS: 160.0000 mg | ORAL_TABLET | Freq: Every day | ORAL | 3 refills | Status: DC

## 2020-06-30 MED ORDER — OMEGA-3-ACID ETHYL ESTERS 1 G PO CAPS: 2.0000 g | ORAL_CAPSULE | Freq: Two times a day (BID) | ORAL | 3 refills | Status: DC

## 2020-06-30 NOTE — Telephone Encounter (Signed)
PA attempted for pantoprazole but was denied. Denied due to request does not meet to the definition of medical necessity found in the members benefit booklet. This med is covered when two OTC generic PPI sucha as omeprazole or lansoprazole has been tried and did not work or when the member cannot take all other OTC generic PPI medications. Please advise. Thank you

## 2020-07-01 NOTE — Telephone Encounter (Signed)
1.  Please explain this to the patient-outside of our control (they can certainly call the insurance company and see if they can get it authorized but we cannot-we have tried) I would recommend omeprazole 20 mg OTC take 1 every single day.  If that is not helping enough then the next step would be trying pantoprazole 30 mg 1 every single day he will have to try these medicines at least 30 days of each before we can reattempt to try to get a prescription PPI covered It should be noted that all of these medicines are PPIs and are very similar in strength and efficacy thank you

## 2020-07-02 ENCOUNTER — Other Ambulatory Visit: Payer: Self-pay

## 2020-07-02 NOTE — Addendum Note (Signed)
Addended by: Anastasio Auerbach R on: 07/02/2020 04:05 PM   Modules accepted: Orders

## 2020-07-02 NOTE — Telephone Encounter (Signed)
Patient notified that insurance denied Pantoprazole prescription. Patient states that Dr Nicki Reaper had told him it was a good idea to come off of this medication everyday so he still has the bottle from 3 months ago and only takes it if he has really bad heartburn.

## 2020-07-02 NOTE — Telephone Encounter (Signed)
Pantoprazole d/c from current medication list .

## 2020-07-02 NOTE — Telephone Encounter (Signed)
That is fine Please remove it from epic med list In the future use OTC omeprazole when necessary for heartburn on a infrequent basis

## 2020-07-31 IMAGING — DX DG HAND COMPLETE 3+V*R*
3 series · 3 of 3 positions shown · non-contrast
Comparison: None.

CLINICAL DATA: Right hand pain, no known injury

EXAM:
RIGHT HAND - COMPLETE 3+ VIEW

[hand pa]
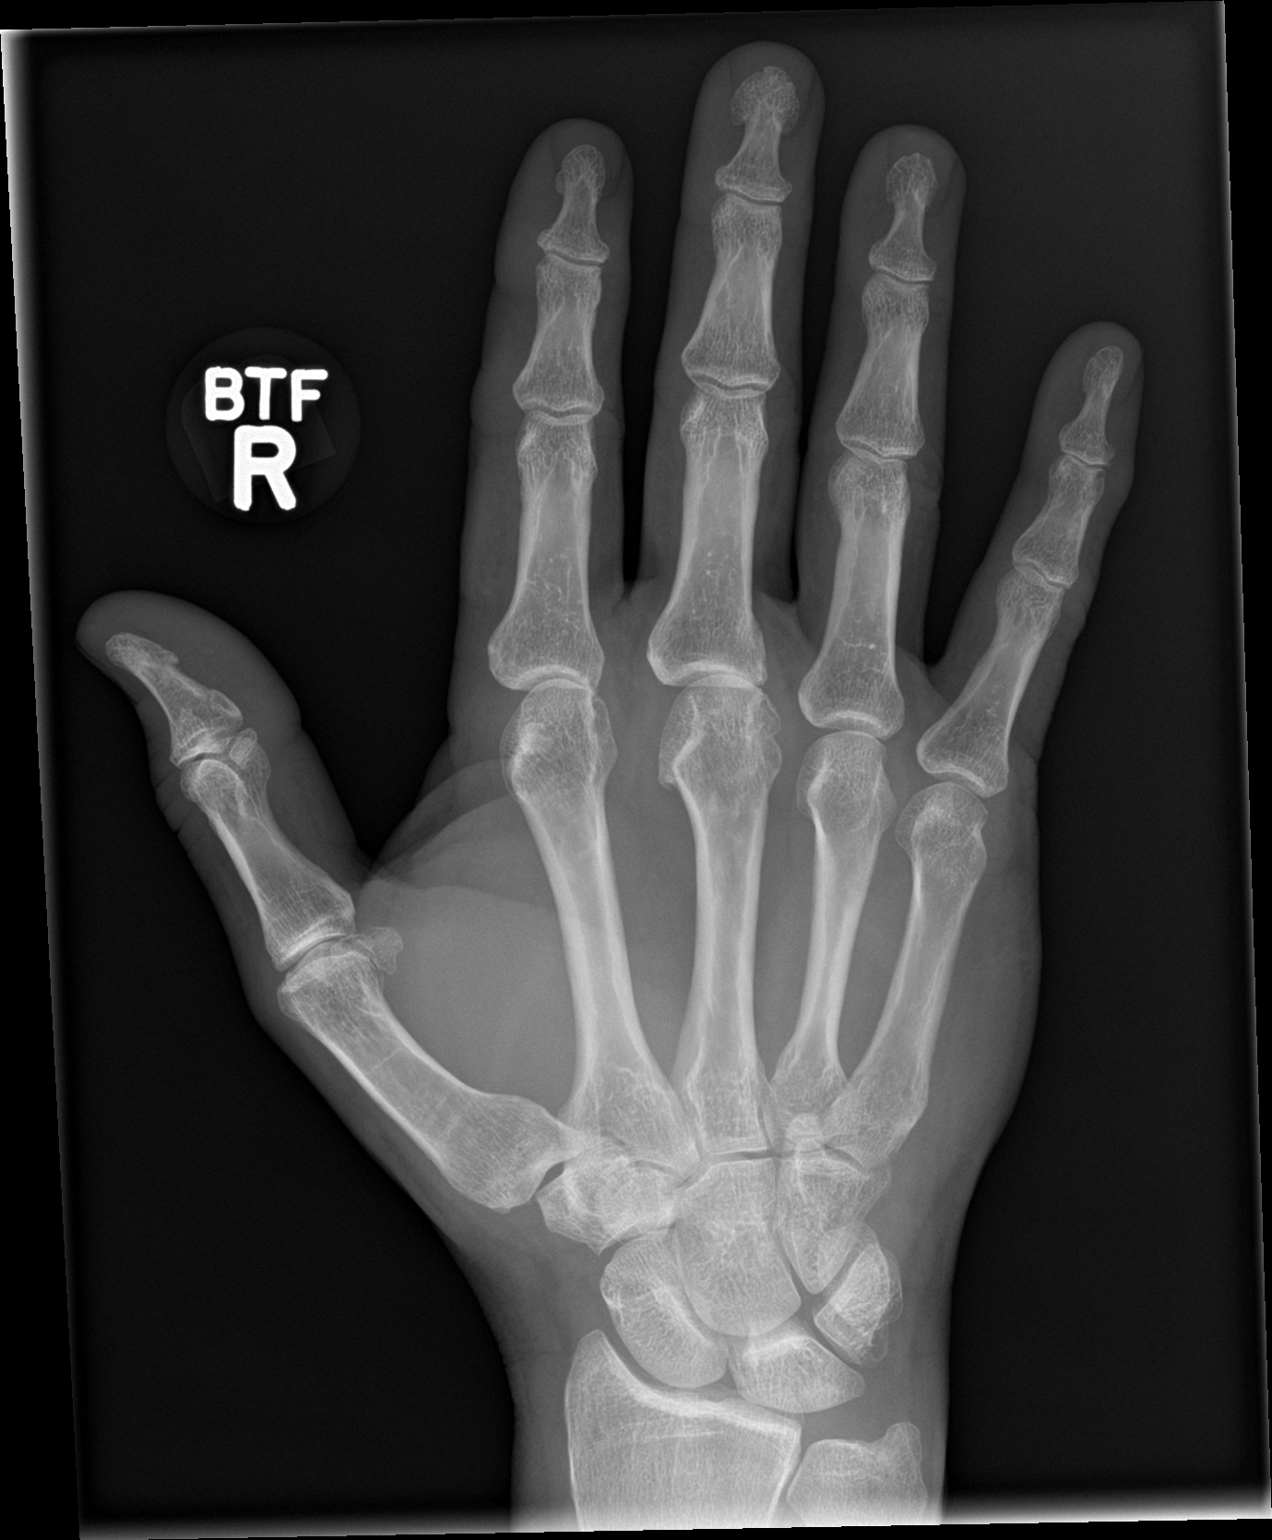

[hand obl]
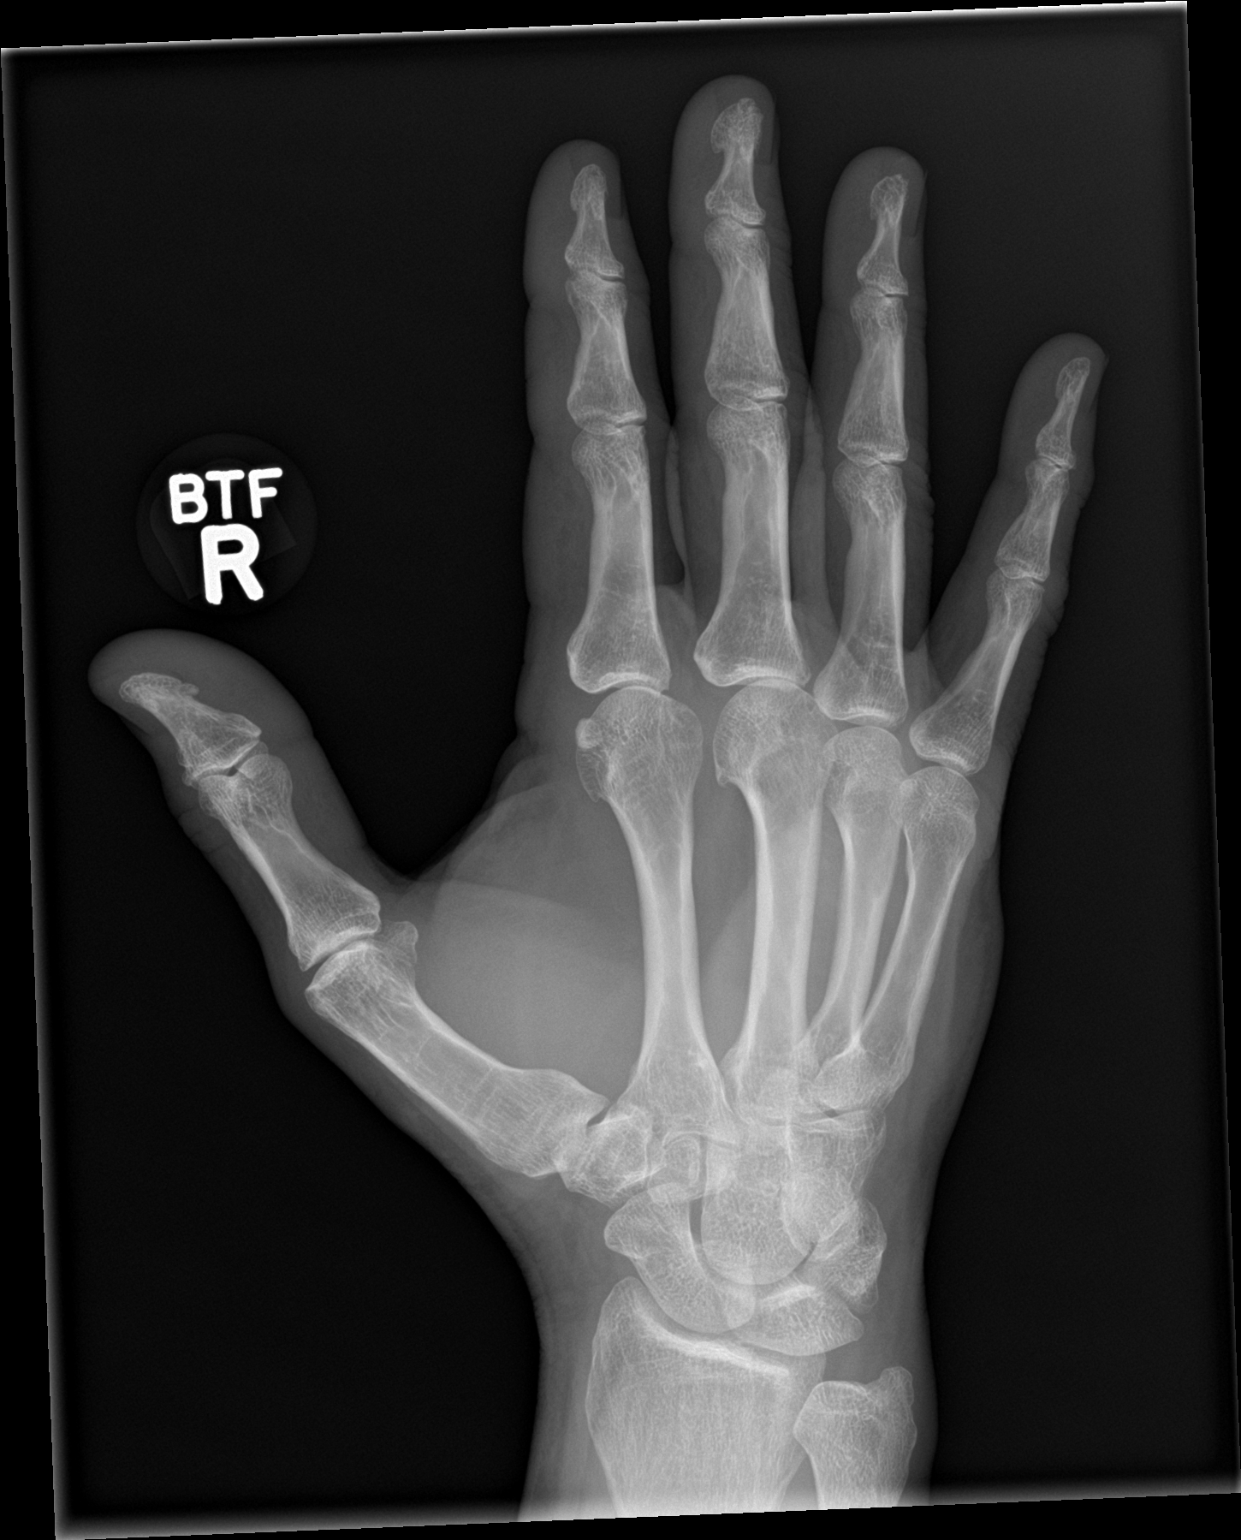

[hand lat]
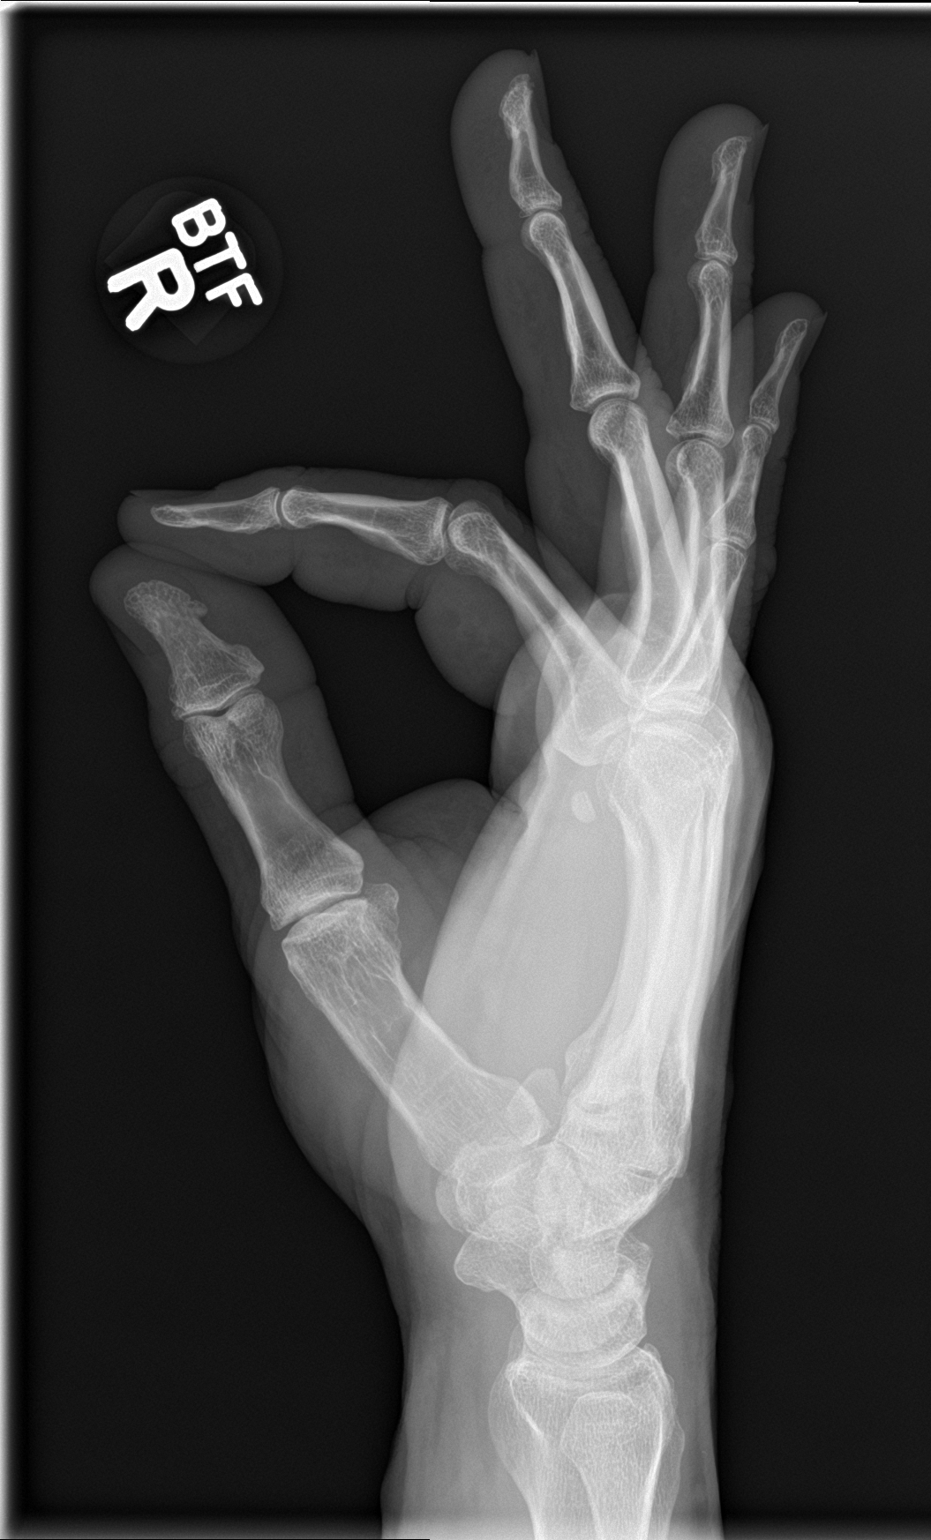

[3 of 3 positions shown; findings below may reference images not displayed]

FINDINGS: No fracture or dislocation of the right hand. There is mild
osteoarthritic pattern degenerative change about the hand, notably
of the thumb metacarpophalangeal joint and thumb basal joints. Soft
tissues are unremarkable.
IMPRESSION: No fracture or dislocation of the right hand. There is mild
osteoarthritic pattern degenerative change about the hand, notably
of the thumb metacarpophalangeal joint and thumb basal joints.

## 2020-08-05 ENCOUNTER — Encounter: Payer: Self-pay | Admitting: Family Medicine

## 2020-08-10 ENCOUNTER — Encounter: Payer: Self-pay | Admitting: Family Medicine

## 2020-08-10 ENCOUNTER — Ambulatory Visit: Payer: BC Managed Care – PPO | Admitting: Family Medicine

## 2020-09-11 ENCOUNTER — Ambulatory Visit: Payer: BC Managed Care – PPO | Admitting: Nurse Practitioner

## 2020-09-18 ENCOUNTER — Other Ambulatory Visit: Payer: Self-pay

## 2020-09-18 ENCOUNTER — Ambulatory Visit: Payer: BC Managed Care – PPO | Admitting: Nurse Practitioner

## 2020-09-18 ENCOUNTER — Encounter: Payer: Self-pay | Admitting: Nurse Practitioner

## 2020-09-18 VITALS — BP 149/80 | HR 80 | Ht 70.0 in | Wt 203.0 lb

## 2020-09-18 DIAGNOSIS — I10 Essential (primary) hypertension: Secondary | ICD-10-CM | POA: Diagnosis not present

## 2020-09-18 DIAGNOSIS — E782 Mixed hyperlipidemia: Secondary | ICD-10-CM | POA: Diagnosis not present

## 2020-09-18 DIAGNOSIS — E119 Type 2 diabetes mellitus without complications: Secondary | ICD-10-CM

## 2020-09-18 LAB — POCT GLYCOSYLATED HEMOGLOBIN (HGB A1C): Hemoglobin A1C: 9.7 % — AB (ref 4.0–5.6)

## 2020-09-18 MED ORDER — VITAMIN D (ERGOCALCIFEROL) 1.25 MG (50000 UNIT) PO CAPS: 50000.0000 [IU] | ORAL_CAPSULE | ORAL | 0 refills | Status: DC

## 2020-09-18 MED ORDER — HUMULIN R U-500 KWIKPEN 500 UNIT/ML ~~LOC~~ SOPN: 75.0000 [IU] | PEN_INJECTOR | Freq: Three times a day (TID) | SUBCUTANEOUS | 2 refills | Status: DC

## 2020-09-18 NOTE — Progress Notes (Signed)
Endocrinology Follow Up Visit      09/18/2020, 9:34 AM   Subjective:    Patient ID: Terry [name], male    DOB: Oct 19, 1960.  Terry [name] is being seen in follow up after being seen in consultation for management of currently uncontrolled symptomatic diabetes requested by  Kathyrn Drown, MD.   Past Medical History:  Diagnosis Date   Diabetes mellitus without complication ([place]) 8938   Hyperlipidemia    Hypertension    Osteoarthritis     Past Surgical History:  Procedure Laterality Date   ANKLE SURGERY Left 2007   2 plates/screws   COLONOSCOPY N/A 08/04/2017   Procedure: COLONOSCOPY;  Surgeon: Rogene Houston, MD;  [facility];  Service: Endoscopy;  Laterality: N/A;   ESOPHAGEAL DILATION N/A 08/04/2017   Procedure: ESOPHAGEAL DILATION;  Surgeon: Rogene Houston, MD;  [facility];  Service: Endoscopy;  Laterality: N/A;   ESOPHAGOGASTRODUODENOSCOPY N/A 08/04/2017   Procedure: ESOPHAGOGASTRODUODENOSCOPY (EGD);  Surgeon: Rogene Houston, MD;  [facility];  Service: Endoscopy;  Laterality: N/A;   POLYPECTOMY  08/04/2017   Procedure: POLYPECTOMY;  Surgeon: Rogene Houston, MD;  [facility];  Service: Endoscopy;;    Social History   Socioeconomic History   Marital status: Married    Spouse name: Not on file   Number of children: Not on file   Years of education: Not on file   Highest education level: Not on file  Occupational History   Not on file  Tobacco Use   Smoking status: Former    Pack years: 0.00    Types: Cigarettes    Quit date: 06/15/1992    Years since quitting: 28.2   Smokeless tobacco: Never  Vaping Use   Vaping Use: Never used  Substance and Sexual Activity   Alcohol use: Never   Drug use: Not Currently    Types: Marijuana    Comment: last used 1970   Sexual activity: Not on file  Other Topics Concern   Not on file  Social History  Narrative   Not on file   Social Determinants of Health   Financial Resource Strain: Not on file  Food Insecurity: Not on file  Transportation Needs: Not on file  Physical Activity: Not on file  Stress: Not on file  Social Connections: Not on file    Family History  Problem Relation Age of Onset   Hypertension Mother    Diabetes Mother    Heart disease Mother    Hyperlipidemia Mother    Cancer Father        lung    Outpatient Encounter Medications as of 09/18/2020  Medication Sig   Vitamin D, Ergocalciferol, (DRISDOL) 1.25 MG (50000 UNIT) CAPS capsule Take 1 capsule (50,000 Units total) by mouth every 7 (seven) days.   amLODipine (NORVASC) 10 MG tablet Take 1 tablet (10 mg total) by mouth daily.   ascorbic acid (VITAMIN C) 500 MG tablet Take 1 tablet (500 mg total) by mouth daily.   aspirin EC 81 MG tablet Take 1 tablet (81 mg total) by mouth daily with breakfast.   Continuous Blood Gluc Sensor (DEXCOM G6 SENSOR)  MISC Apply 1 sensor every 10 days for glucose monitoring   Continuous Blood Gluc Transmit (DEXCOM G6 TRANSMITTER) MISC 1 Piece by Does not apply route as directed.   fenofibrate 160 MG tablet Take 1 tablet (160 mg total) by mouth daily.   indapamide (LOZOL) 2.5 MG tablet Take 1 tablet (2.5 mg total) by mouth daily.   insulin regular human CONCENTRATED (HUMULIN R U-500 KWIKPEN) 500 UNIT/ML kwikpen Inject 75 Units into the skin 3 (three) times daily with meals.   losartan (COZAAR) 100 MG tablet Take 1 tablet (100 mg total) by mouth daily.   metoprolol succinate (TOPROL-XL) 100 MG 24 hr tablet TAKE (1) TABLET BY MOUTH ONCE DAILY.   mometasone (ELOCON) 0.1 % cream APPLY TO AFFECTED AREAS TWICE DAILY AS NEEDED.   Multiple Vitamin (MULTIVITAMIN) tablet Take 1 tablet by mouth daily.   oxyCODONE-acetaminophen (PERCOCET) 10-325 MG tablet Take one tablet po 4 times daily prn pain   rosuvastatin (CRESTOR) 40 MG tablet Take 1 tablet (40 mg total) by mouth daily.   sildenafil  (REVATIO) 20 MG tablet Take 1-5 tablets before sexual activity as directed. No greater that 5 tablets in a day.   TECHLITE PEN NEEDLES 32G X 4 MM MISC INJECT 4 TIMES DAILY AS DIRECTED.   triamcinolone cream (KENALOG) 0.1 % USE SMALL DABS TID   zinc sulfate 220 (50 Zn) MG capsule Take 1 capsule (220 mg total) by mouth daily.   [DISCONTINUED] CONTOUR NEXT TEST test strip TEST BLOOD SUGAR 4 TIMES DAILY.   [DISCONTINUED] insulin regular human CONCENTRATED (HUMULIN R U-500 KWIKPEN) 500 UNIT/ML kwikpen Inject 60 Units into the skin 3 (three) times daily with meals. (Patient taking differently: Inject 65 Units into the skin 3 (three) times daily with meals.)   [DISCONTINUED] oxyCODONE-acetaminophen (PERCOCET) 10-325 MG tablet Take one tablet po 4 times daily prn pain   [DISCONTINUED] oxyCODONE-acetaminophen (PERCOCET) 10-325 MG tablet Take one tablet po 4 times daily prn pain   No facility-administered encounter medications on file as of 09/18/2020.    ALLERGIES: Allergies  Allergen Reactions   Invokana [Canagliflozin] Other (See Comments)    laryngitis   Altace [Ramipril] Cough   Metformin And Related Diarrhea    VACCINATION STATUS: Immunization History  Administered Date(s) Administered   Influenza,inj,Quad PF,6+ Mos 01/30/2014, 01/29/2016, 01/30/2017, 01/26/2018, 01/23/2019, 01/17/2020   Influenza-Unspecified 01/26/2012, 02/21/2015   Pneumococcal Polysaccharide-23 01/30/2014   Td 12/19/2014    Diabetes He presents for his follow-up diabetic visit. He has type 2 diabetes mellitus. Onset time: Diagnosed at approximate age of 28. His disease course has been worsening. There are no hypoglycemic associated symptoms. Pertinent negatives for hypoglycemia include no nervousness/anxiousness, sweats or tremors. Associated symptoms include fatigue. Pertinent negatives for diabetes include no blurred vision, no polydipsia, no polyphagia and no polyuria. There are no hypoglycemic complications. Symptoms  are improving. Diabetic complications include nephropathy. Risk factors for coronary artery disease include diabetes mellitus, dyslipidemia, hypertension, male sex and obesity. Current diabetic treatment includes intensive insulin program. He is compliant with treatment most of the time. His weight is fluctuating minimally. He is following a generally unhealthy diet. When asked about meal planning, he reported none. He has not had a previous visit with a dietitian. He participates in exercise intermittently. His home blood glucose trend is increasing steadily. His breakfast blood glucose range is generally 180-200 mg/dl. His lunch blood glucose range is generally >200 mg/dl. His dinner blood glucose range is generally >200 mg/dl. His bedtime blood glucose range is generally >200  mg/dl. (He presents today with his CGM, no logs, showing increasing glycemic profile both fasting and postprandially.  His POCT A1c today is 9.7%, increasing from last visit of 8.6%.  He reports he has not been eating as healthy lately.  He cares for his wife with RA and eats what is convenient and easy.  He denies any hypoglycemia.) An ACE inhibitor/angiotensin II receptor blocker is being taken. He does not see a podiatrist.Eye exam is not current.  Hypertension This is a chronic problem. The current episode started more than 1 year ago. The problem has been waxing and waning since onset. The problem is uncontrolled. Pertinent negatives include no blurred vision or sweats. There are no associated agents to hypertension. Risk factors for coronary artery disease include diabetes mellitus, dyslipidemia, male gender and obesity. Past treatments include angiotensin blockers and calcium channel blockers. The current treatment provides mild improvement. Compliance problems include diet and exercise.  Hypertensive end-organ damage includes kidney disease. Identifiable causes of hypertension include chronic renal disease.  Hyperlipidemia This  is a chronic problem. The current episode started more than 1 year ago. The problem is uncontrolled. Recent lipid tests were reviewed and are variable. Exacerbating diseases include chronic renal disease and diabetes. Factors aggravating his hyperlipidemia include fatty foods. Current antihyperlipidemic treatment includes statins and fibric acid derivatives. The current treatment provides mild improvement of lipids. Compliance problems include adherence to diet, adherence to exercise and psychosocial issues.  Risk factors for coronary artery disease include diabetes mellitus, dyslipidemia, hypertension, male sex and obesity.   Review of systems  Constitutional: + Minimally fluctuating body weight,  current Body mass index is 29.13 kg/m. , no fatigue, no subjective hyperthermia, no subjective hypothermia Eyes: no blurry vision, no xerophthalmia ENT: no sore throat, no nodules palpated in throat, no dysphagia/odynophagia, no hoarseness Cardiovascular: no chest pain, no shortness of breath, no palpitations, no leg swelling Respiratory: no cough, no shortness of breath Gastrointestinal: no nausea/vomiting/diarrhea Musculoskeletal: no muscle/joint aches Skin: no rashes, no hyperemia Neurological: no tremors, no numbness, no tingling, no dizziness Psychiatric: no depression, no anxiety   Objective:     BP (!) 149/80   Pulse 80   Ht 5\' 10"  (1.778 m)   Wt 203 lb (92.1 kg)   BMI 29.13 kg/m   Wt Readings from Last 3 Encounters:  09/18/20 203 lb (92.1 kg)  06/29/20 202 lb 3.2 oz (91.7 kg)  06/08/20 205 lb (93 kg)    BP Readings from Last 3 Encounters:  09/18/20 (!) 149/80  06/29/20 (!) 148/82  06/08/20 (!) 161/80     Physical Exam- Limited  Constitutional:  Body mass index is 29.13 kg/m. , not in acute distress, normal state of mind Eyes:  EOMI, no exophthalmos Neck: Supple Cardiovascular: RRR, no murmurs, rubs, or gallops, no edema Respiratory: Adequate breathing efforts, no  crackles, rales, rhonchi, or wheezing Musculoskeletal: no gross deformities, strength intact in all four extremities, no gross restriction of joint movements Skin:  no rashes, no hyperemia Neurological: no tremor with outstretched hands   Foot exam:  No rashes, ulcers, cuts, calluses, onychodystrophy.  Good pulses bilat. Good sensation to 10 g monofilament bilat.    CMP ( most recent) CMP     Component Value Date/Time   NA 137 06/29/2020 0901   K 4.5 06/29/2020 0901   CL 95 (L) 06/29/2020 0901   CO2 22 06/29/2020 0901   GLUCOSE 304 (H) 06/29/2020 0901   GLUCOSE 156 (H) 04/10/2019 0430   BUN 25 (H)  06/29/2020 0901   CREATININE 1.15 06/29/2020 0901   CREATININE 0.91 02/25/2014 0727   CALCIUM 10.3 (H) 06/29/2020 0901   PROT 7.8 06/29/2020 0901   ALBUMIN 5.0 (H) 06/29/2020 0901   AST 25 06/29/2020 0901   ALT 44 06/29/2020 0901   ALKPHOS 53 06/29/2020 0901   BILITOT 0.4 06/29/2020 0901   GFRNONAA 57 (L) 01/17/2020 0928   GFRAA 66 01/17/2020 0928     Diabetic Labs (most recent): Lab Results  Component Value Date   HGBA1C 9.7 (A) 09/18/2020   HGBA1C 8.6 (A) 06/08/2020   HGBA1C 10.1 (H) 01/17/2020     Lipid Panel ( most recent) Lipid Panel     Component Value Date/Time   CHOL 210 (H) 06/29/2020 0901   TRIG 1,002 (HH) 06/29/2020 0901   HDL 27 (L) 06/29/2020 0901   CHOLHDL 7.8 (H) 06/29/2020 0901   CHOLHDL 4.4 02/25/2014 0727   VLDL 35 02/25/2014 0727   LDLCALC Comment (A) 06/29/2020 0901   LABVLDL Comment (A) 06/29/2020 0901      Lab Results  Component Value Date   TSH 1.280 06/29/2020   TSH 1.470 11/11/2019   TSH 2.144 04/04/2019   TSH 0.986 07/01/2015   FREET4 1.39 06/29/2020   FREET4 1.44 11/11/2019           Assessment & Plan:   1) DM type 2 without retinopathy ([place])  - Terry [name] has currently uncontrolled symptomatic type 2 DM since  61 years of age.   Recent labs reviewed.  He presents today with his CGM, no logs, showing increasing  glycemic profile both fasting and postprandially.  His POCT A1c today is 9.7%, increasing from last visit of 8.6%.  He reports he has not been eating as healthy lately.  He cares for his wife with RA and eats what is convenient and easy.  He denies any hypoglycemia.  - I had a long discussion with him about the progressive nature of diabetes and the pathology behind its complications. -his diabetes is complicated by CKD stage 3 and he remains at a high risk for more acute and chronic complications which include CAD, CVA, CKD, retinopathy, and neuropathy. These are all discussed in detail with him.  - Nutritional counseling repeated at each appointment due to patients tendency to fall back in to old habits.  - The patient admits there is a room for improvement in their diet and drink choices. -  Suggestion is made for the patient to avoid simple carbohydrates from their diet including Cakes, Sweet Desserts / Pastries, Ice Cream, Soda (diet and regular), Sweet Tea, Candies, Chips, Cookies, Sweet Pastries, Store Bought Juices, Alcohol in Excess of 1-2 drinks a day, Artificial Sweeteners, Coffee Creamer, and "Sugar-free" Products. This will help patient to have stable blood glucose profile and potentially avoid unintended weight gain.   - I encouraged the patient to switch to unprocessed or minimally processed complex starch and increased protein intake (animal or plant source), fruits, and vegetables.   - Patient is advised to stick to a routine mealtimes to eat 3 meals a day and avoid unnecessary snacks (to snack only to correct hypoglycemia).  - I have approached him with the following individualized plan to manage  his diabetes and patient agrees:   -Based on his hyperglycemia overall, he will tolerate increase in his Humulin R- U500 to 75 units TID with meals if glucose is above 90 and he is eating.    -He is encouraged to continue using his  CGM to monitor glucose 4 times daily, before meals and  before bed, and to call the clinic if he has readings less than 70 or greater than 300 for 3 tests in a row.  - he is warned not to take insulin without proper monitoring per orders.  -He is not a candidate for incretin therapy at this time due to elevated triglycerides increasing his risk for pancreatitis.  - Specific targets for  A1c;  LDL, HDL,  and Triglycerides were discussed with the patient.  2) Blood Pressure /Hypertension: His blood pressure is still not controlled to target.  He says his BP is typically high in doctors offices due to white coat syndrome.  He is advised to continue Cozaar 100 mg po daily, Indapamide 2.5 mg po daily, and Metoprolol 100 mg po daily.      3) Lipids/Hyperlipidemia:    His most recent lipid panel from 06/29/20 shows severely elevated triglycerides of 1002.  He is advised to continue Crestor 40 mg po daily at bedtime and Fenofibrate 160 mg po daily.  Side effects and precautions discussed with him.  We discussed the need to avoid fried foods and butter to reduce his risk of cardiovascular events.  4)  Weight/Diet:  His Body mass index is 29.13 kg/m.  -   he is a candidate for weight loss. I discussed with him the fact that loss of 5 - 10% of his  current body weight will have the most impact on his diabetes management.  Exercise, and detailed carbohydrates information provided  -  detailed on discharge instructions.  5) Chronic Care/Health Maintenance: -he on ACEI/ARB and Statin medications and  is encouraged to initiate and continue to follow up with Ophthalmology, Dentist,  Podiatrist at least yearly or according to recommendations, and advised to stay away from smoking. I have recommended yearly flu vaccine and pneumonia vaccine at least every 5 years; moderate intensity exercise for up to 150 minutes weekly; and  sleep for at least 7 hours a day.  - he is  advised to maintain close follow up with Kathyrn Drown, MD for primary care needs, as well as his  other providers for optimal and coordinated care.     I spent 42 minutes in the care of the patient today including review of labs from [place], Lipids, Thyroid Function, Hematology (current and previous including abstractions from other facilities); face-to-face time discussing  his blood glucose readings/logs, discussing hypoglycemia and hyperglycemia episodes and symptoms, medications doses, his options of short and long term treatment based on the latest standards of care / guidelines;  discussion about incorporating lifestyle medicine;  and documenting the encounter.    Please refer to Patient Instructions for Blood Glucose Monitoring and Insulin/Medications Dosing Guide"  in media tab for additional information. Please  also refer to " Patient Self Inventory" in the Media  tab for reviewed elements of pertinent patient history.  Terry [name] participated in the discussions, expressed understanding, and voiced agreement with the above plans.  All questions were answered to his satisfaction. he is encouraged to contact clinic should he have any questions or concerns prior to his return visit.   Follow up plan: - Return in about 3 months (around 12/19/2020) for Diabetes F/U with A1c in office, No previsit labs, Bring meter and logs.    Rayetta Pigg, [facility] [address] [place], [place] [address] Phone: [phone] Fax: [phone]  09/18/2020, 9:34 AM

## 2020-09-18 NOTE — Patient Instructions (Signed)

## 2020-09-30 ENCOUNTER — Ambulatory Visit (INDEPENDENT_AMBULATORY_CARE_PROVIDER_SITE_OTHER): Payer: BC Managed Care – PPO | Admitting: Family Medicine

## 2020-09-30 ENCOUNTER — Other Ambulatory Visit: Payer: Self-pay

## 2020-09-30 VITALS — BP 138/68 | HR 83 | Temp 98.8°F | Wt 204.2 lb

## 2020-09-30 DIAGNOSIS — I359 Nonrheumatic aortic valve disorder, unspecified: Secondary | ICD-10-CM

## 2020-09-30 DIAGNOSIS — Z125 Encounter for screening for malignant neoplasm of prostate: Secondary | ICD-10-CM

## 2020-09-30 DIAGNOSIS — E1129 Type 2 diabetes mellitus with other diabetic kidney complication: Secondary | ICD-10-CM

## 2020-09-30 DIAGNOSIS — E1169 Type 2 diabetes mellitus with other specified complication: Secondary | ICD-10-CM | POA: Diagnosis not present

## 2020-09-30 DIAGNOSIS — E785 Hyperlipidemia, unspecified: Secondary | ICD-10-CM

## 2020-09-30 DIAGNOSIS — Z79891 Long term (current) use of opiate analgesic: Secondary | ICD-10-CM | POA: Diagnosis not present

## 2020-09-30 DIAGNOSIS — I10 Essential (primary) hypertension: Secondary | ICD-10-CM | POA: Diagnosis not present

## 2020-09-30 DIAGNOSIS — M1990 Unspecified osteoarthritis, unspecified site: Secondary | ICD-10-CM

## 2020-09-30 DIAGNOSIS — Z79899 Other long term (current) drug therapy: Secondary | ICD-10-CM

## 2020-09-30 DIAGNOSIS — R809 Proteinuria, unspecified: Secondary | ICD-10-CM

## 2020-09-30 MED ORDER — OXYCODONE-ACETAMINOPHEN 10-325 MG PO TABS: ORAL_TABLET | ORAL | 0 refills | Status: DC

## 2020-09-30 MED ORDER — AMLODIPINE BESYLATE 10 MG PO TABS: 10.0000 mg | ORAL_TABLET | Freq: Every day | ORAL | 3 refills | Status: DC

## 2020-09-30 NOTE — Patient Instructions (Signed)
Do labs late sept Office recheck in 3 months

## 2020-09-30 NOTE — Progress Notes (Signed)
Subjective:    Patient ID: Terry [name], male    DOB: 1960-12-16, 60 y.o.   MRN: [id number]  HPI Pt here for HTN and pain management. Pt states blood pressure does well at home. No issues. Checks blood pressure at home about every other day.  Pt is now seeing Endocrinology Rayetta Pigg  This patient was seen today for chronic pain  The medication list was reviewed and updated.  Location of Pain for which the patient has been treated with regarding narcotics:   Onset of this pain:    -Compliance with medication: Oxycodone 10-325  - Number patient states they take daily: 4  -when was the last dose patient took? Last night  The patient was advised the importance of maintaining medication and not using illegal substances with these.  Here for refills and follow up  The patient was educated that we can provide 3 monthly scripts for their medication, it is their responsibility to follow the instructions.  Side effects or complications from medications: none  Patient is aware that pain medications are meant to minimize the severity of the pain to allow their pain levels to improve to allow for better function. They are aware of that pain medications cannot totally remove their pain.  Due for UDT ( at least once per year) : will obtain today  Scale of 1 to 10 ( 1 is least 10 is most) Your pain level without the medicine: 9 Your pain level with medication:5  Scale 1 to 10 ( 1-helps very little, 10 helps very well) How well does your pain medication reduce your pain so you can function better through out the day? 6  Quality of the pain: Deep ache throbbing  Persistence of the pain: Persistent all the time worse in the morning worse in the hands and ankles and to some degree lower back  Modifying factors: Worse with activity  Encounter for long-term opiate analgesic use - Plan: ToxASSURE Select 13 (MW), Urine  Essential hypertension, benign - Plan: Hemoglobin A1c, Urine  Microalbumin w/creat. ratio, Lipid Profile, Basic Metabolic Panel (BMET), Hepatic function panel, PSA  Osteoarthritis, unspecified osteoarthritis type, unspecified site  Diabetes mellitus with proteinuria ([place]) - Plan: Hemoglobin A1c, Urine Microalbumin w/creat. ratio, Lipid Profile, Basic Metabolic Panel (BMET), Hepatic function panel, PSA  Hyperlipidemia associated with type 2 diabetes mellitus ([place]) - Plan: Hemoglobin A1c, Urine Microalbumin w/creat. ratio, Lipid Profile, Basic Metabolic Panel (BMET), Hepatic function panel, PSA  Screening for prostate cancer - Plan: PSA  High risk medication use - Plan: Hemoglobin A1c, Urine Microalbumin w/creat. ratio, Lipid Profile, Basic Metabolic Panel (BMET), Hepatic function panel, PSA  Aortic valve calcification  Patient denies any type of significant shortness of breath.  Being followed by endocrinology for his diabetes.     Review of Systems     Objective:   Physical Exam General-in no acute distress Eyes-no discharge Lungs-respiratory rate normal, CTA CV-mild aortic murmurs,RRR Extremities skin warm dry no edema Neuro grossly normal Behavior normal, alert        Assessment & Plan:   1. Encounter for long-term opiate analgesic use The patient was seen in followup for chronic pain. A review over at their current pain status was discussed. Drug registry was checked. Prescriptions were given.  Regular follow-up recommended. Discussion was held regarding the importance of compliance with medication as well as pain medication contract.  Patient was informed that medication may cause drowsiness and should not be combined  with other medications/alcohol or  street drugs. If the patient feels medication is causing altered alertness then do not drive or operate dangerous equipment. Drug registry checked 3 scripts were sent in Importance of keeping drugs in a safe place Drug contract signed with patient Patient does benefit from the  pain medicine we will maintain at 4/day  - ToxASSURE Select 13 (MW), Urine  2. Essential hypertension, benign Blood pressure is elevated in the office but good at home.  Continue current measures. - Hemoglobin A1c - Urine Microalbumin w/creat. ratio - Lipid Profile - Basic Metabolic Panel (BMET) - Hepatic function panel - PSA  3. Osteoarthritis, unspecified osteoarthritis type, unspecified site Patient has severe osteoarthritis of the hands as well as knee and ankle pain medicine does benefit him but he will maintain it no more than 4/day  4. Diabetes mellitus with proteinuria ([place]) Followed by endocrinology but we will do comprehensive lab work before his next visit with them because he is also due for kidney function PSA and urine micro protein at that time - Hemoglobin A1c - Urine Microalbumin w/creat. ratio - Lipid Profile - Basic Metabolic Panel (BMET) - Hepatic function panel - PSA  5. Hyperlipidemia associated with type 2 diabetes mellitus ([place]) Await findings continue medication healthy diet recommended - Hemoglobin A1c - Urine Microalbumin w/creat. ratio - Lipid Profile - Basic Metabolic Panel (BMET) - Hepatic function panel - PSA  6. Screening for prostate cancer We will be doing wellness with pain management visit in 3 months - PSA  7. High risk medication use Check kidney function before his next visit - Hemoglobin A1c - Urine Microalbumin w/creat. ratio - Lipid Profile - Basic Metabolic Panel (BMET) - Hepatic function panel - PSA  8. Aortic valve calcification He has aortic valve calcification evident by murmur on exam but not causing him symptomatic issues.  He had an echo completed back in 2021.  No need to repeat echo currently.

## 2020-10-06 LAB — TOXASSURE SELECT 13 (MW), URINE

## 2020-10-23 ENCOUNTER — Other Ambulatory Visit: Payer: Self-pay | Admitting: Nurse Practitioner

## 2020-10-28 ENCOUNTER — Other Ambulatory Visit: Payer: Self-pay | Admitting: Nurse Practitioner

## 2020-10-28 DIAGNOSIS — E782 Mixed hyperlipidemia: Secondary | ICD-10-CM

## 2020-12-18 DIAGNOSIS — I10 Essential (primary) hypertension: Secondary | ICD-10-CM | POA: Diagnosis not present

## 2020-12-18 DIAGNOSIS — E785 Hyperlipidemia, unspecified: Secondary | ICD-10-CM | POA: Diagnosis not present

## 2020-12-18 DIAGNOSIS — E1129 Type 2 diabetes mellitus with other diabetic kidney complication: Secondary | ICD-10-CM | POA: Diagnosis not present

## 2020-12-18 DIAGNOSIS — Z79899 Other long term (current) drug therapy: Secondary | ICD-10-CM | POA: Diagnosis not present

## 2020-12-18 DIAGNOSIS — E1169 Type 2 diabetes mellitus with other specified complication: Secondary | ICD-10-CM | POA: Diagnosis not present

## 2020-12-18 DIAGNOSIS — R809 Proteinuria, unspecified: Secondary | ICD-10-CM | POA: Diagnosis not present

## 2020-12-18 DIAGNOSIS — Z125 Encounter for screening for malignant neoplasm of prostate: Secondary | ICD-10-CM | POA: Diagnosis not present

## 2020-12-19 LAB — BASIC METABOLIC PANEL
BUN/Creatinine Ratio: 14 (ref 9–20)
BUN: 21 mg/dL (ref 6–24)
CO2: 23 mmol/L (ref 20–29)
Calcium: 10.7 mg/dL — ABNORMAL HIGH (ref 8.7–10.2)
Chloride: 101 mmol/L (ref 96–106)
Creatinine, Ser: 1.54 mg/dL — ABNORMAL HIGH (ref 0.76–1.27)
Glucose: 176 mg/dL — ABNORMAL HIGH (ref 65–99)
Potassium: 4.4 mmol/L (ref 3.5–5.2)
Sodium: 140 mmol/L (ref 134–144)
eGFR: 52 mL/min/{1.73_m2} — ABNORMAL LOW (ref >59)

## 2020-12-19 LAB — HEPATIC FUNCTION PANEL
ALT: 37 IU/L (ref 0–44)
AST: 32 IU/L (ref 0–40)
Albumin: 5.1 g/dL — ABNORMAL HIGH (ref 3.8–4.9)
Alkaline Phosphatase: 40 IU/L — ABNORMAL LOW (ref 44–121)
Bilirubin Total: 0.3 mg/dL (ref 0.0–1.2)
Bilirubin, Direct: 0.11 mg/dL (ref 0.00–0.40)
Total Protein: 7.4 g/dL (ref 6.0–8.5)

## 2020-12-19 LAB — MICROALBUMIN / CREATININE URINE RATIO
Creatinine, Urine: 84.8 mg/dL
Microalb/Creat Ratio: 135 mg/g creat — ABNORMAL HIGH (ref 0–29)
Microalbumin, Urine: 114.8 ug/mL

## 2020-12-19 LAB — HEMOGLOBIN A1C
Est. average glucose Bld gHb Est-mCnc: 209 mg/dL
Hgb A1c MFr Bld: 8.9 % — ABNORMAL HIGH (ref 4.8–5.6)

## 2020-12-19 LAB — LIPID PANEL
Chol/HDL Ratio: 5.2 ratio — ABNORMAL HIGH (ref 0.0–5.0)
Cholesterol, Total: 172 mg/dL (ref 100–199)
HDL: 33 mg/dL — ABNORMAL LOW (ref >39)
LDL Chol Calc (NIH): 98 mg/dL (ref 0–99)
Triglycerides: 238 mg/dL — ABNORMAL HIGH (ref 0–149)
VLDL Cholesterol Cal: 41 mg/dL — ABNORMAL HIGH (ref 5–40)

## 2020-12-19 LAB — PSA: Prostate Specific Ag, Serum: 0.1 ng/mL (ref 0.0–4.0)

## 2020-12-24 NOTE — Patient Instructions (Signed)

## 2020-12-25 ENCOUNTER — Encounter: Payer: Self-pay | Admitting: Nurse Practitioner

## 2020-12-25 ENCOUNTER — Other Ambulatory Visit: Payer: Self-pay

## 2020-12-25 ENCOUNTER — Ambulatory Visit: Payer: BC Managed Care – PPO | Admitting: Nurse Practitioner

## 2020-12-25 VITALS — BP 118/73 | HR 67 | Ht 70.0 in | Wt 208.0 lb

## 2020-12-25 DIAGNOSIS — I10 Essential (primary) hypertension: Secondary | ICD-10-CM

## 2020-12-25 DIAGNOSIS — E119 Type 2 diabetes mellitus without complications: Secondary | ICD-10-CM | POA: Diagnosis not present

## 2020-12-25 DIAGNOSIS — E782 Mixed hyperlipidemia: Secondary | ICD-10-CM

## 2020-12-25 MED ORDER — TRULICITY 1.5 MG/0.5ML ~~LOC~~ SOAJ: 1.5000 mg | SUBCUTANEOUS | 3 refills | Status: DC

## 2020-12-25 NOTE — Progress Notes (Signed)
Endocrinology Follow Up Visit      12/25/2020, 9:08 AM   Subjective:    Patient ID: Terry [name], male    DOB: 1960-06-07.  Terry [name] is being seen in follow up after being seen in consultation for management of currently uncontrolled symptomatic diabetes requested by  Kathyrn Drown, MD.   Past Medical History:  Diagnosis Date   Diabetes mellitus without complication ([place]) 2353   Hyperlipidemia    Hypertension    Osteoarthritis     Past Surgical History:  Procedure Laterality Date   ANKLE SURGERY Left 2007   2 plates/screws   COLONOSCOPY N/A 08/04/2017   Procedure: COLONOSCOPY;  Surgeon: Rogene Houston, MD;  [facility];  Service: Endoscopy;  Laterality: N/A;   ESOPHAGEAL DILATION N/A 08/04/2017   Procedure: ESOPHAGEAL DILATION;  Surgeon: Rogene Houston, MD;  [facility];  Service: Endoscopy;  Laterality: N/A;   ESOPHAGOGASTRODUODENOSCOPY N/A 08/04/2017   Procedure: ESOPHAGOGASTRODUODENOSCOPY (EGD);  Surgeon: Rogene Houston, MD;  [facility];  Service: Endoscopy;  Laterality: N/A;   POLYPECTOMY  08/04/2017   Procedure: POLYPECTOMY;  Surgeon: Rogene Houston, MD;  [facility];  Service: Endoscopy;;    Social History   Socioeconomic History   Marital status: Married    Spouse name: Not on file   Number of children: Not on file   Years of education: Not on file   Highest education level: Not on file  Occupational History   Not on file  Tobacco Use   Smoking status: Former    Types: Cigarettes    Quit date: 06/15/1992    Years since quitting: 28.5   Smokeless tobacco: Never  Vaping Use   Vaping Use: Never used  Substance and Sexual Activity   Alcohol use: Never   Drug use: Not Currently    Types: Marijuana    Comment: last used 1970   Sexual activity: Not on file  Other Topics Concern   Not on file  Social History Narrative   Not on  file   Social Determinants of Health   Financial Resource Strain: Not on file  Food Insecurity: Not on file  Transportation Needs: Not on file  Physical Activity: Not on file  Stress: Not on file  Social Connections: Not on file    Family History  Problem Relation Age of Onset   Hypertension Mother    Diabetes Mother    Heart disease Mother    Hyperlipidemia Mother    Cancer Father        lung    Outpatient Encounter Medications as of 12/25/2020  Medication Sig   amLODipine (NORVASC) 10 MG tablet Take 1 tablet (10 mg total) by mouth daily.   ascorbic acid (VITAMIN C) 500 MG tablet Take 1 tablet (500 mg total) by mouth daily.   aspirin EC 81 MG tablet Take 1 tablet (81 mg total) by mouth daily with breakfast.   Continuous Blood Gluc Sensor (DEXCOM G6 SENSOR) MISC Apply 1 sensor every 10 days for glucose monitoring   Continuous Blood Gluc Transmit (DEXCOM G6 TRANSMITTER) MISC 1 Piece by Does not apply route as directed.  Dulaglutide (TRULICITY) 1.5 QQ/5.9DG SOPN Inject 1.5 mg into the skin once a week.   fenofibrate 160 MG tablet TAKE 1 TABLET BY MOUTH ONCE A DAY.   indapamide (LOZOL) 2.5 MG tablet Take 1 tablet (2.5 mg total) by mouth daily.   insulin regular human CONCENTRATED (HUMULIN R U-500 KWIKPEN) 500 UNIT/ML KwikPen Inject 75 Units into the skin 3 (three) times daily with meals.   losartan (COZAAR) 100 MG tablet Take 1 tablet (100 mg total) by mouth daily.   metoprolol succinate (TOPROL-XL) 100 MG 24 hr tablet TAKE (1) TABLET BY MOUTH ONCE DAILY.   mometasone (ELOCON) 0.1 % cream APPLY TO AFFECTED AREAS TWICE DAILY AS NEEDED.   Multiple Vitamin (MULTIVITAMIN) tablet Take 1 tablet by mouth daily.   oxyCODONE-acetaminophen (PERCOCET) 10-325 MG tablet Take one tablet po 4 times daily prn pain   oxyCODONE-acetaminophen (PERCOCET) 10-325 MG tablet Take one tablet po 4 times daily prn pain   oxyCODONE-acetaminophen (PERCOCET) 10-325 MG tablet Take one tablet po 4 times daily  prn pain   rosuvastatin (CRESTOR) 40 MG tablet Take 1 tablet (40 mg total) by mouth daily.   sildenafil (REVATIO) 20 MG tablet Take 1-5 tablets before sexual activity as directed. No greater that 5 tablets in a day.   TECHLITE PEN NEEDLES 32G X 4 MM MISC INJECT 4 TIMES DAILY AS DIRECTED.   zinc sulfate 220 (50 Zn) MG capsule Take 1 capsule (220 mg total) by mouth daily.   Vitamin D, Ergocalciferol, (DRISDOL) 1.25 MG (50000 UNIT) CAPS capsule Take 1 capsule (50,000 Units total) by mouth every 7 (seven) days. (Patient not taking: Reported on 12/25/2020)   No facility-administered encounter medications on file as of 12/25/2020.    ALLERGIES: Allergies  Allergen Reactions   Invokana [Canagliflozin] Other (See Comments)    laryngitis   Altace [Ramipril] Cough   Metformin And Related Diarrhea    VACCINATION STATUS: Immunization History  Administered Date(s) Administered   Influenza,inj,Quad PF,6+ Mos 01/30/2014, 01/29/2016, 01/30/2017, 01/26/2018, 01/23/2019, 01/17/2020   Influenza-Unspecified 01/26/2012, 02/21/2015   Pneumococcal Polysaccharide-23 01/30/2014   Td 12/19/2014    Diabetes He presents for his follow-up diabetic visit. He has type 2 diabetes mellitus. Onset time: Diagnosed at approximate age of 32. His disease course has been improving. There are no hypoglycemic associated symptoms. Pertinent negatives for hypoglycemia include no nervousness/anxiousness, sweats or tremors. Associated symptoms include fatigue. Pertinent negatives for diabetes include no blurred vision, no polydipsia, no polyphagia and no polyuria. There are no hypoglycemic complications. Symptoms are improving. Diabetic complications include nephropathy. Risk factors for coronary artery disease include diabetes mellitus, dyslipidemia, hypertension, male sex and obesity. Current diabetic treatment includes intensive insulin program. He is compliant with treatment most of the time. His weight is fluctuating minimally.  He is following a generally unhealthy diet. When asked about meal planning, he reported none. He has not had a previous visit with a dietitian. He participates in exercise intermittently. His home blood glucose trend is fluctuating minimally. His breakfast blood glucose range is generally 180-200 mg/dl. His lunch blood glucose range is generally >200 mg/dl. His dinner blood glucose range is generally >200 mg/dl. (He presents today with his logs, no meter, showing above target fasting and postprandial glycemic profile.  His previsit A1c was 8.9%, improving from last visit of 9.7%.  He still struggles with meal quantity, does not eat snacks, does not eat processed sugars.) An ACE inhibitor/angiotensin II receptor blocker is being taken. He does not see a podiatrist.Eye exam is not  current.  Hypertension This is a chronic problem. The current episode started more than 1 year ago. The problem has been gradually improving since onset. The problem is controlled. Pertinent negatives include no blurred vision or sweats. There are no associated agents to hypertension. Risk factors for coronary artery disease include diabetes mellitus, dyslipidemia, male gender and obesity. Past treatments include angiotensin blockers and calcium channel blockers. The current treatment provides mild improvement. Compliance problems include diet and exercise.  Hypertensive end-organ damage includes kidney disease. Identifiable causes of hypertension include chronic renal disease.  Hyperlipidemia This is a chronic problem. The current episode started more than 1 year ago. The problem is uncontrolled. Recent lipid tests were reviewed and are variable. Exacerbating diseases include chronic renal disease and diabetes. Factors aggravating his hyperlipidemia include fatty foods. Current antihyperlipidemic treatment includes statins and fibric acid derivatives. The current treatment provides moderate improvement of lipids. Compliance problems  include adherence to diet, adherence to exercise and psychosocial issues.  Risk factors for coronary artery disease include diabetes mellitus, dyslipidemia, hypertension, male sex and obesity.   Review of systems  Constitutional: + Minimally fluctuating body weight,  current Body mass index is 29.84 kg/m. , no fatigue, no subjective hyperthermia, no subjective hypothermia Eyes: no blurry vision, no xerophthalmia ENT: no sore throat, no nodules palpated in throat, no dysphagia/odynophagia, no hoarseness Cardiovascular: no chest pain, no shortness of breath, no palpitations, no leg swelling Respiratory: no cough, no shortness of breath Gastrointestinal: no nausea/vomiting/diarrhea Musculoskeletal: no muscle/joint aches Skin: no rashes, no hyperemia Neurological: no tremors, no numbness, no tingling, no dizziness Psychiatric: no depression, no anxiety   Objective:     BP 118/73   Pulse 67   Ht 5\' 10"  (1.778 m)   Wt 208 lb (94.3 kg)   BMI 29.84 kg/m   Wt Readings from Last 3 Encounters:  12/25/20 208 lb (94.3 kg)  09/30/20 204 lb 3.2 oz (92.6 kg)  09/18/20 203 lb (92.1 kg)    BP Readings from Last 3 Encounters:  12/25/20 118/73  09/30/20 138/68  09/18/20 (!) 149/80     Physical Exam- Limited  Constitutional:  Body mass index is 29.84 kg/m. , not in acute distress, normal state of mind Eyes:  EOMI, no exophthalmos Neck: Supple Cardiovascular: RRR, no murmurs, rubs, or gallops, no edema Respiratory: Adequate breathing efforts, no crackles, rales, rhonchi, or wheezing Musculoskeletal: no gross deformities, strength intact in all four extremities, no gross restriction of joint movements Skin:  no rashes, no hyperemia Neurological: no tremor with outstretched hands    CMP ( most recent) CMP     Component Value Date/Time   NA 140 12/18/2020 0804   K 4.4 12/18/2020 0804   CL 101 12/18/2020 0804   CO2 23 12/18/2020 0804   GLUCOSE 176 (H) 12/18/2020 0804   GLUCOSE 156  (H) 04/10/2019 0430   BUN 21 12/18/2020 0804   CREATININE 1.54 (H) 12/18/2020 0804   CREATININE 0.91 02/25/2014 0727   CALCIUM 10.7 (H) 12/18/2020 0804   PROT 7.4 12/18/2020 0804   ALBUMIN 5.1 (H) 12/18/2020 0804   AST 32 12/18/2020 0804   ALT 37 12/18/2020 0804   ALKPHOS 40 (L) 12/18/2020 0804   BILITOT 0.3 12/18/2020 0804   GFRNONAA 57 (L) 01/17/2020 0928   GFRAA 66 01/17/2020 0928     Diabetic Labs (most recent): Lab Results  Component Value Date   HGBA1C 8.9 (H) 12/18/2020   HGBA1C 9.7 (A) 09/18/2020   HGBA1C 8.6 (A) 06/08/2020  Lipid Panel ( most recent) Lipid Panel     Component Value Date/Time   CHOL 172 12/18/2020 0804   TRIG 238 (H) 12/18/2020 0804   HDL 33 (L) 12/18/2020 0804   CHOLHDL 5.2 (H) 12/18/2020 0804   CHOLHDL 4.4 02/25/2014 0727   VLDL 35 02/25/2014 0727   LDLCALC 98 12/18/2020 0804   LABVLDL 41 (H) 12/18/2020 0804      Lab Results  Component Value Date   TSH 1.280 06/29/2020   TSH 1.470 11/11/2019   TSH 2.144 04/04/2019   TSH 0.986 07/01/2015   FREET4 1.39 06/29/2020   FREET4 1.44 11/11/2019           Assessment & Plan:   1) DM type 2 without retinopathy ([place])  - Terry [name] has currently uncontrolled symptomatic type 2 DM since  60 years of age.  He presents today with his logs, no meter, showing above target fasting and postprandial glycemic profile.  His previsit A1c was 8.9%, improving from last visit of 9.7%.  He still struggles with meal quantity, does not eat snacks, does not eat processed sugars. He denies any significant hypoglycemia.   Recent labs reviewed.  - I had a long discussion with him about the progressive nature of diabetes and the pathology behind its complications. -his diabetes is complicated by CKD stage 3 and he remains at a high risk for more acute and chronic complications which include CAD, CVA, CKD, retinopathy, and neuropathy. These are all discussed in detail with him.  - Nutritional counseling  repeated at each appointment due to patients tendency to fall back in to old habits.  - The patient admits there is a room for improvement in their diet and drink choices. -  Suggestion is made for the patient to avoid simple carbohydrates from their diet including Cakes, Sweet Desserts / Pastries, Ice Cream, Soda (diet and regular), Sweet Tea, Candies, Chips, Cookies, Sweet Pastries, Store Bought Juices, Alcohol in Excess of 1-2 drinks a day, Artificial Sweeteners, Coffee Creamer, and "Sugar-free" Products. This will help patient to have stable blood glucose profile and potentially avoid unintended weight gain.   - I encouraged the patient to switch to unprocessed or minimally processed complex starch and increased protein intake (animal or plant source), fruits, and vegetables.   - Patient is advised to stick to a routine mealtimes to eat 3 meals a day and avoid unnecessary snacks (to snack only to correct hypoglycemia).  - I have approached him with the following individualized plan to manage  his diabetes and patient agrees:   -Based on his hyperglycemia overall, he will tolerate increase in his Humulin R- U500 to 75 units TID with meals if glucose is above 90 and he is eating.  His triglycerides have improved tremendously, thus allowing him to be a candidate for incretin therapy.  I discussed and initiated Trulicity 1.61 mg SQ weekly x 2 weeks (sample provided from office) then if tolerated he is to increase to 1.5 mg SQ weekly thereafter.  -He is encouraged to continue monitoring glucose 4 times daily, before meals and before bed, and to call the clinic if he has readings less than 70 or greater than 300 for 3 tests in a row.  - he is warned not to take insulin without proper monitoring per orders.  - Specific targets for  A1c;  LDL, HDL,  and Triglycerides were discussed with the patient.  2) Blood Pressure /Hypertension: His blood pressure is controlled to target.  He  says his BP is  typically high in doctors offices due to white coat syndrome.  He is advised to continue Cozaar 100 mg po daily, Indapamide 2.5 mg po daily, and Metoprolol 100 mg po daily.      3) Lipids/Hyperlipidemia:    His most recent lipid panel from 12/18/20 shows controlled LDL of 98 and tremendously improved triglycerides of 238. He is advised to continue Crestor 40 mg po daily at bedtime and Fenofibrate 160 mg po daily.  Side effects and precautions discussed with him.  We discussed the need to avoid fried foods and butter to reduce his risk of cardiovascular events.  4)  Weight/Diet:  His Body mass index is 29.84 kg/m.  -   he is a candidate for weight loss. I discussed with him the fact that loss of 5 - 10% of his  current body weight will have the most impact on his diabetes management.  Exercise, and detailed carbohydrates information provided  -  detailed on discharge instructions.  5) Chronic Care/Health Maintenance: -he on ACEI/ARB and Statin medications and  is encouraged to initiate and continue to follow up with Ophthalmology, Dentist,  Podiatrist at least yearly or according to recommendations, and advised to stay away from smoking. I have recommended yearly flu vaccine and pneumonia vaccine at least every 5 years; moderate intensity exercise for up to 150 minutes weekly; and  sleep for at least 7 hours a day.  - he is advised to maintain close follow up with Kathyrn Drown, MD for primary care needs, as well as his other providers for optimal and coordinated care.       I spent 40 minutes in the care of the patient today including review of labs from [place], Lipids, Thyroid Function, Hematology (current and previous including abstractions from other facilities); face-to-face time discussing  his blood glucose readings/logs, discussing hypoglycemia and hyperglycemia episodes and symptoms, medications doses, his options of short and long term treatment based on the latest standards of care /  guidelines;  discussion about incorporating lifestyle medicine;  and documenting the encounter.    Please refer to Patient Instructions for Blood Glucose Monitoring and Insulin/Medications Dosing Guide"  in media tab for additional information. Please  also refer to " Patient Self Inventory" in the Media  tab for reviewed elements of pertinent patient history.  Terry [name] participated in the discussions, expressed understanding, and voiced agreement with the above plans.  All questions were answered to his satisfaction. he is encouraged to contact clinic should he have any questions or concerns prior to his return visit.   Follow up plan: - Return in about 3 months (around 03/26/2021) for Diabetes F/U with A1c in office, No previsit labs, Bring meter and logs.    Rayetta Pigg, [facility] [address] [place], [place] [address] Phone: [phone] Fax: [phone]  12/25/2020, 9:08 AM

## 2020-12-29 ENCOUNTER — Other Ambulatory Visit: Payer: Self-pay | Admitting: Family Medicine

## 2020-12-29 ENCOUNTER — Other Ambulatory Visit: Payer: Self-pay | Admitting: Nurse Practitioner

## 2020-12-29 DIAGNOSIS — E782 Mixed hyperlipidemia: Secondary | ICD-10-CM

## 2020-12-29 MED ORDER — OXYCODONE-ACETAMINOPHEN 10-325 MG PO TABS: ORAL_TABLET | ORAL | 0 refills | Status: DC

## 2020-12-29 NOTE — Telephone Encounter (Signed)
Patient called and states that he is out he ran out yesterday. He would like to know if this can be filled prior to his appt on Friday. He states he is having a lot of hip pain today.  CB# [phone]

## 2021-01-01 ENCOUNTER — Other Ambulatory Visit: Payer: Self-pay

## 2021-01-01 ENCOUNTER — Encounter: Payer: Self-pay | Admitting: Family Medicine

## 2021-01-01 ENCOUNTER — Ambulatory Visit: Payer: BC Managed Care – PPO | Admitting: Family Medicine

## 2021-01-01 VITALS — BP 134/84 | Temp 98.6°F | Wt 209.8 lb

## 2021-01-01 DIAGNOSIS — Z79891 Long term (current) use of opiate analgesic: Secondary | ICD-10-CM | POA: Diagnosis not present

## 2021-01-01 DIAGNOSIS — E1129 Type 2 diabetes mellitus with other diabetic kidney complication: Secondary | ICD-10-CM | POA: Diagnosis not present

## 2021-01-01 DIAGNOSIS — R809 Proteinuria, unspecified: Secondary | ICD-10-CM

## 2021-01-01 DIAGNOSIS — Z23 Encounter for immunization: Secondary | ICD-10-CM

## 2021-01-01 DIAGNOSIS — Z79899 Other long term (current) drug therapy: Secondary | ICD-10-CM

## 2021-01-01 DIAGNOSIS — I10 Essential (primary) hypertension: Secondary | ICD-10-CM

## 2021-01-01 MED ORDER — OXYCODONE-ACETAMINOPHEN 10-325 MG PO TABS: ORAL_TABLET | ORAL | 0 refills | Status: DC

## 2021-01-01 MED ORDER — LOSARTAN POTASSIUM 100 MG PO TABS: 100.0000 mg | ORAL_TABLET | Freq: Every day | ORAL | 1 refills | Status: DC

## 2021-01-01 MED ORDER — INDAPAMIDE 2.5 MG PO TABS: 2.5000 mg | ORAL_TABLET | Freq: Every day | ORAL | 1 refills | Status: DC

## 2021-01-01 MED ORDER — ROSUVASTATIN CALCIUM 40 MG PO TABS: 40.0000 mg | ORAL_TABLET | Freq: Every day | ORAL | 1 refills | Status: DC

## 2021-01-01 MED ORDER — METOPROLOL SUCCINATE ER 100 MG PO TB24: ORAL_TABLET | ORAL | 1 refills | Status: DC

## 2021-01-01 NOTE — Progress Notes (Signed)
Subjective:    Patient ID: Terry [name], male    DOB: 05/04/1960, 60 y.o.   MRN: [id number]  HPI This patient was seen today for chronic pain  The medication list was reviewed and updated.  Location of Pain for which the patient has been treated with regarding narcotics:   Onset of this pain:    -Compliance with medication: yes; Oxycodone 10-325 mg   - Number patient states they take daily: 4  -when was the last dose patient took? Last night   The patient was advised the importance of maintaining medication and not using illegal substances with these.  Here for refills and follow up  The patient was educated that we can provide 3 monthly scripts for their medication, it is their responsibility to follow the instructions.  Side effects or complications from medications: none  Patient is aware that pain medications are meant to minimize the severity of the pain to allow their pain levels to improve to allow for better function. They are aware of that pain medications cannot totally remove their pain.  Due for UDT ( at least once per year) : July 2022  Scale of 1 to 10 ( 1 is least 10 is most) Your pain level without the medicine: 9 Your pain level with medication: 6  Scale 1 to 10 ( 1-helps very little, 10 helps very well) How well does your pain medication reduce your pain so you can function better through out the day? 8  Quality of the pain: Burning throbbing  Persistence of the pain: Present all the time  Modifying factors: Better with medication,     Essential hypertension, benign - Plan: Vitamin D (25 hydroxy), PTH, Intact and Calcium, Calcium, ionized, Basic Metabolic Panel (BMET)  Diabetes mellitus with proteinuria ([place]) - Plan: Ambulatory referral to Ophthalmology, Vitamin D (25 hydroxy), PTH, Intact and Calcium, Calcium, ionized, Basic Metabolic Panel (BMET)  Encounter for long-term opiate analgesic use - Plan: Vitamin D (25 hydroxy), PTH, Intact and  Calcium, Calcium, ionized, Basic Metabolic Panel (BMET)  Hypercalcemia - Plan: Vitamin D (25 hydroxy), PTH, Intact and Calcium, Calcium, ionized, Basic Metabolic Panel (BMET)  Need for vaccination - Plan: Flu Vaccine QUAD 6+ mos PF IM (Fluarix Quad PF)  High risk medication use - Plan: Vitamin D (25 hydroxy), PTH, Intact and Calcium, Calcium, ionized, Basic Metabolic Panel (BMET)    Review of Systems     Objective:   Physical Exam  General-in no acute distress Eyes-no discharge Lungs-respiratory rate normal, CTA CV-light early systolic murmur has had echo RRR Extremities skin warm dry no edema Neuro grossly normal Behavior normal, alert       Assessment & Plan:   1. Essential hypertension, benign Blood pressure good control continue current measures watch diet stay active - Vitamin D (25 hydroxy) - PTH, Intact and Calcium - Calcium, ionized - Basic Metabolic Panel (BMET)  2. Diabetes mellitus with proteinuria ([place]) Diabetes being followed by specialist.  Numbers could be better he is working harder at trying to get this under better control - Ambulatory referral to Ophthalmology - Vitamin D (25 hydroxy) - PTH, Intact and Calcium - Calcium, ionized - Basic Metabolic Panel (BMET)  3. Encounter for long-term opiate analgesic use Recent calciums have been elevated look into this further with vitamin D PTH ionized calcium and metabolic 7 - Vitamin D (25 hydroxy) - PTH, Intact and Calcium - Calcium, ionized - Basic Metabolic Panel (BMET)  4. Hypercalcemia See above - Vitamin  D (25 hydroxy) - PTH, Intact and Calcium - Calcium, ionized - Basic Metabolic Panel (BMET)  5. Need for vaccination Today - Flu Vaccine QUAD 6+ mos PF IM (Fluarix Quad PF)  6. High risk medication use Do labs - Vitamin D (25 hydroxy) - PTH, Intact and Calcium - Calcium, ionized - Basic Metabolic Panel (BMET)   The patient was seen in followup for chronic pain. A review over at  their current pain status was discussed. Drug registry was checked. Prescriptions were given.  Regular follow-up recommended. Discussion was held regarding the importance of compliance with medication as well as pain medication contract.  Patient was informed that medication may cause drowsiness and should not be combined  with other medications/alcohol or street drugs. If the patient feels medication is causing altered alertness then do not drive or operate dangerous equipment. Follow-up mid December

## 2021-02-15 DIAGNOSIS — Z79891 Long term (current) use of opiate analgesic: Secondary | ICD-10-CM | POA: Diagnosis not present

## 2021-02-15 DIAGNOSIS — I10 Essential (primary) hypertension: Secondary | ICD-10-CM | POA: Diagnosis not present

## 2021-02-15 DIAGNOSIS — R809 Proteinuria, unspecified: Secondary | ICD-10-CM | POA: Diagnosis not present

## 2021-02-15 DIAGNOSIS — E1129 Type 2 diabetes mellitus with other diabetic kidney complication: Secondary | ICD-10-CM | POA: Diagnosis not present

## 2021-02-16 LAB — BASIC METABOLIC PANEL
BUN/Creatinine Ratio: 21 — ABNORMAL HIGH (ref 9–20)
BUN: 30 mg/dL — ABNORMAL HIGH (ref 6–24)
CO2: 26 mmol/L (ref 20–29)
Calcium: 10.9 mg/dL — ABNORMAL HIGH (ref 8.7–10.2)
Chloride: 99 mmol/L (ref 96–106)
Creatinine, Ser: 1.46 mg/dL — ABNORMAL HIGH (ref 0.76–1.27)
Glucose: 212 mg/dL — ABNORMAL HIGH (ref 70–99)
Potassium: 4.6 mmol/L (ref 3.5–5.2)
Sodium: 141 mmol/L (ref 134–144)
eGFR: 55 mL/min/{1.73_m2} — ABNORMAL LOW (ref >59)

## 2021-02-16 LAB — CALCIUM, IONIZED: Calcium, Ion: 5.7 mg/dL — ABNORMAL HIGH (ref 4.5–5.6)

## 2021-02-16 LAB — VITAMIN D 25 HYDROXY (VIT D DEFICIENCY, FRACTURES): Vit D, 25-Hydroxy: 40.6 ng/mL (ref 30.0–100.0)

## 2021-02-16 LAB — PTH, INTACT AND CALCIUM: PTH: 11 pg/mL — ABNORMAL LOW (ref 15–65)

## 2021-02-19 ENCOUNTER — Other Ambulatory Visit: Payer: Self-pay | Admitting: Nurse Practitioner

## 2021-03-08 ENCOUNTER — Other Ambulatory Visit: Payer: Self-pay

## 2021-03-08 ENCOUNTER — Ambulatory Visit: Payer: BC Managed Care – PPO | Admitting: Family Medicine

## 2021-03-08 VITALS — BP 140/72 | HR 101 | Temp 99.4°F | Ht 70.0 in | Wt 206.8 lb

## 2021-03-08 DIAGNOSIS — Z79891 Long term (current) use of opiate analgesic: Secondary | ICD-10-CM | POA: Diagnosis not present

## 2021-03-08 DIAGNOSIS — E1129 Type 2 diabetes mellitus with other diabetic kidney complication: Secondary | ICD-10-CM

## 2021-03-08 DIAGNOSIS — Z1321 Encounter for screening for nutritional disorder: Secondary | ICD-10-CM

## 2021-03-08 DIAGNOSIS — R809 Proteinuria, unspecified: Secondary | ICD-10-CM

## 2021-03-08 DIAGNOSIS — E785 Hyperlipidemia, unspecified: Secondary | ICD-10-CM

## 2021-03-08 DIAGNOSIS — E1169 Type 2 diabetes mellitus with other specified complication: Secondary | ICD-10-CM | POA: Diagnosis not present

## 2021-03-08 MED ORDER — OXYCODONE-ACETAMINOPHEN 10-325 MG PO TABS: ORAL_TABLET | ORAL | 0 refills | Status: DC

## 2021-03-08 MED ORDER — VALSARTAN 160 MG PO TABS: 160.0000 mg | ORAL_TABLET | Freq: Every day | ORAL | 1 refills | Status: DC

## 2021-03-08 NOTE — Patient Instructions (Addendum)
Please remember to do your labs on Dec 23   Stop losartan and start Valsartan 160 mg one daily   Good Morning [name], Your Referral to Ophthamology has been sent to [facility]. Their Office will call you to schedule your appointment. In the meantime, if you would like to check the status of your Referral you may contact their Office.    [facility]  Address: [address], [place] Phone Number: [phone]   [facility] you have a [place] Day!

## 2021-03-08 NOTE — Progress Notes (Signed)
Subjective:    Patient ID: Terry [name], male    DOB: 08-28-1960, 60 y.o.   MRN: [id number]  HPI  This patient was seen today for chronic pain  The medication list was reviewed and updated.  Location of Pain for which the patient has been treated with regarding narcotics: hips, knees, ankles and hands  Onset of this pain: years   -Compliance with medication: 4 pills a day  - Number patient states they take daily: 4  -when was the last dose patient took? This am  The patient was advised the importance of maintaining medication and not using illegal substances with these.  Here for refills and follow up  The patient was educated that we can provide 3 monthly scripts for their medication, it is their responsibility to follow the instructions.  Side effects or complications from medications: none  Patient is aware that pain medications are meant to minimize the severity of the pain to allow their pain levels to improve to allow for better function. They are aware of that pain medications cannot totally remove their pain.  Due for UDT ( at least once per year) : 09/30/20  Scale of 1 to 10 ( 1 is least 10 is most) Your pain level without the medicine: 8 Your pain level with medication 6-7  Encounter for long-term opiate analgesic use  Diabetes mellitus with proteinuria ([place]) - Plan: PE and FLC, Serum, Protein Electrophoresis, (serum), PTH-Related Peptide, Hemoglobin A1c  Hypercalcemia - Plan: PE and FLC, Serum, Protein Electrophoresis, (serum), PTH-Related Peptide  Hyperlipidemia associated with type 2 diabetes mellitus ([place]) - Plan: Lipid panel  Encounter for vitamin deficiency screening - Plan: Vitamin D 1,25 dihydroxy  His diabetes is doing okay.  He relates he is tolerating Trulicity so far follows up with endocrinology later this month  Patient has trends toward elevated calcium.  Hard to know what is triggering this.  Needs further work-up. Not taking any calcium  supplements.     Review of Systems     Objective:   Physical Exam  General-in no acute distress Eyes-no discharge Lungs-respiratory rate normal, CTA CV-no murmurs,RRR Extremities skin warm dry no edema Neuro grossly normal Behavior normal, alert       Assessment & Plan:  The patient was seen in followup for chronic pain. A review over at their current pain status was discussed. Drug registry was checked. Prescriptions were given.  Regular follow-up recommended. Discussion was held regarding the importance of compliance with medication as well as pain medication contract.  Patient was informed that medication may cause drowsiness and should not be combined  with other medications/alcohol or street drugs. If the patient feels medication is causing altered alertness then do not drive or operate dangerous equipment.  Should be noted that the patient appears to be meeting appropriate use of opioids and response.  Evidenced by improved function and decent pain control without significant side effects and no evidence of overt aberrancy issues.  Upon discussion with the patient today they understand that opioid therapy is optional and they feel that the pain has been refractory to reasonable conservative measures and is significant and affecting quality of life enough to warrant ongoing therapy and wishes to continue opioids.  Refills were provided.  1. Encounter for long-term opiate analgesic use Please see above  2. Diabetes mellitus with proteinuria ([facility]) Check A1c before next visit with endocrinology - PE and FLC, Serum - Protein Electrophoresis, (serum) - PTH-Related Peptide - Hemoglobin A1c  3. Hypercalcemia Further work-up indicated.  Await to see what - PE and FLC, Serum - Protein Electrophoresis, (serum) - PTH-Related Peptide  4. Hyperlipidemia associated with type 2 diabetes mellitus ([place]) Check cholesterol before next visit with endocrinology - Lipid panel  5.  Encounter for vitamin deficiency screening Vitamin D checking this also for hypercalcemia - Vitamin D 1,25 dihydroxy Patient to follow-up in approximately 3 months for pain management

## 2021-03-23 ENCOUNTER — Other Ambulatory Visit: Payer: Self-pay | Admitting: Nurse Practitioner

## 2021-03-25 DIAGNOSIS — Z1321 Encounter for screening for nutritional disorder: Secondary | ICD-10-CM | POA: Diagnosis not present

## 2021-03-25 DIAGNOSIS — R809 Proteinuria, unspecified: Secondary | ICD-10-CM | POA: Diagnosis not present

## 2021-03-25 DIAGNOSIS — E785 Hyperlipidemia, unspecified: Secondary | ICD-10-CM | POA: Diagnosis not present

## 2021-03-25 DIAGNOSIS — E1169 Type 2 diabetes mellitus with other specified complication: Secondary | ICD-10-CM | POA: Diagnosis not present

## 2021-03-25 DIAGNOSIS — E1129 Type 2 diabetes mellitus with other diabetic kidney complication: Secondary | ICD-10-CM | POA: Diagnosis not present

## 2021-03-26 ENCOUNTER — Other Ambulatory Visit: Payer: Self-pay

## 2021-03-26 ENCOUNTER — Ambulatory Visit: Payer: BC Managed Care – PPO | Admitting: Nurse Practitioner

## 2021-03-26 ENCOUNTER — Encounter: Payer: Self-pay | Admitting: Nurse Practitioner

## 2021-03-26 VITALS — BP 145/76 | HR 88 | Ht 70.0 in | Wt 208.0 lb

## 2021-03-26 DIAGNOSIS — E119 Type 2 diabetes mellitus without complications: Secondary | ICD-10-CM

## 2021-03-26 DIAGNOSIS — E782 Mixed hyperlipidemia: Secondary | ICD-10-CM

## 2021-03-26 DIAGNOSIS — I10 Essential (primary) hypertension: Secondary | ICD-10-CM

## 2021-03-26 MED ORDER — DEXCOM G6 TRANSMITTER MISC: 3 refills | Status: DC

## 2021-03-26 MED ORDER — HUMULIN R U-500 KWIKPEN 500 UNIT/ML ~~LOC~~ SOPN: 80.0000 [IU] | PEN_INJECTOR | Freq: Three times a day (TID) | SUBCUTANEOUS | 0 refills | Status: DC

## 2021-03-26 MED ORDER — GLIPIZIDE ER 5 MG PO TB24: 5.0000 mg | ORAL_TABLET | Freq: Every day | ORAL | 3 refills | Status: DC

## 2021-03-26 NOTE — Patient Instructions (Signed)
Hypercalcemia Hypercalcemia is when the level of calcium in a person's blood is above normal. The body needs calcium to make bones and keep them strong. Calcium also helps the muscles, nerves, brain, and heart work the way they should. Most of the calcium in the body is stored in the bones. There is also calcium in the blood. Hypercalcemia occurs when there is too much calcium in your blood. Calcium levels in the blood are regulated by hormones, kidneys, and the gastrointestinal tract.  Hypercalcemia can happen when calcium comes out of the bones, or when the kidneys are not able to remove calcium from the blood. Hypercalcemia can be mild or severe. What are the causes? There are many possible causes of hypercalcemia. Common causes of this condition include: Hyperparathyroidism. This is a condition in which the body produces too much parathyroid hormone. There are four parathyroid glands in your neck. These glands produce a chemical messenger (hormone) that helps the body absorb calcium from foods and helps your bones release calcium. Certain kinds of cancer. Less common causes of hypercalcemia include: Calcium and vitamin D dietary supplements. Chronic kidney disease. Hyperthyroidism. Severe dehydration. Being on bed rest or being inactive for a long time. Certain medicines. Infections. What increases the risk? You are more likely to develop this condition if: You are male. You are 59 years of age or older. You have a family history of hypercalcemia. What are the signs or symptoms? Mild hypercalcemia that starts slowly may not cause symptoms. Severe, sudden hypercalcemia is more likely to cause symptoms, such as: Being more thirsty than usual. Needing to urinate more often than usual. Abdominal pain. Nausea and vomiting. Constipation. Muscle pain, twitching, or weakness. Feeling very tired. How is this diagnosed? Hypercalcemia is usually diagnosed with a blood test. You may also  have tests to help check what is causing this condition. Tests include imaging tests and more blood tests. How is this treated? Treatment for hypercalcemia depends on the cause. Treatment may include: Receiving fluids through an IV. Medicines. These can be used to: Keep calcium levels steady after receiving fluids (loop diuretics). Keep calcium in your bones (bisphosphonates). Lower the calcium level in your blood. Surgery to remove overactive parathyroid glands. A procedure that filters your blood to correct calcium levels (hemodialysis). Follow these instructions at home:  Take over-the-counter and prescription medicines only as told by your health care provider. Follow instructions from your health care provider about eating or drinking restrictions. Drink enough fluid to keep your urine pale yellow. Stay active. Weight-bearing exercise helps to keep calcium in your bones. Follow instructions from your health care provider about what type and level of exercise is safe for you. Keep all follow-up visits. This is important. Contact a health care provider if: You have a fever. Your heartbeat is irregular or very fast. You have changes in mood, memory, or personality. Get help right away if: You have severe abdominal pain. You have chest pain. You have trouble breathing. You become very confused and sleepy. You lose consciousness. These symptoms may represent a serious problem that is an emergency. Do not wait to see if the symptoms will go away. Get medical help right away. Call your local emergency services (911 in the [place] Do not drive yourself to the hospital. Summary Hypercalcemia is when the level of calcium in a person's blood is above normal. The body needs calcium to make bones and keep them strong. There are many possible causes of hypercalcemia, and treatment depends on the  cause. Take over-the-counter and prescription medicines only as told by your health care  provider. This information is not intended to replace advice given to you by your health care provider. Make sure you discuss any questions you have with your health care provider. Document Revised: 08/19/2020 Document Reviewed: 08/19/2020 [employer] Patient Education  2022 [employer].

## 2021-03-26 NOTE — Progress Notes (Signed)
Endocrinology Follow Up Visit      03/26/2021, 9:18 AM   Subjective:    Patient ID: Terry [name], male    DOB: January 16, 1961.  Terry [name] is being seen in follow up after being seen in consultation for management of currently uncontrolled symptomatic diabetes requested by  Kathyrn Drown, MD.   Past Medical History:  Diagnosis Date   Diabetes mellitus without complication ([place]) 4742   Hyperlipidemia    Hypertension    Osteoarthritis     Past Surgical History:  Procedure Laterality Date   ANKLE SURGERY Left 2007   2 plates/screws   COLONOSCOPY N/A 08/04/2017   Procedure: COLONOSCOPY;  Surgeon: Rogene Houston, MD;  [facility];  Service: Endoscopy;  Laterality: N/A;   ESOPHAGEAL DILATION N/A 08/04/2017   Procedure: ESOPHAGEAL DILATION;  Surgeon: Rogene Houston, MD;  [facility];  Service: Endoscopy;  Laterality: N/A;   ESOPHAGOGASTRODUODENOSCOPY N/A 08/04/2017   Procedure: ESOPHAGOGASTRODUODENOSCOPY (EGD);  Surgeon: Rogene Houston, MD;  [facility];  Service: Endoscopy;  Laterality: N/A;   POLYPECTOMY  08/04/2017   Procedure: POLYPECTOMY;  Surgeon: Rogene Houston, MD;  [facility]: [facility];  Service: Endoscopy;;    Social History   Socioeconomic History   Marital status: Married    Spouse name: Not on file   Number of children: Not on file   Years of education: Not on file   Highest education level: Not on file  Occupational History   Not on file  Tobacco Use   Smoking status: Former    Types: Cigarettes    Quit date: 06/15/1992    Years since quitting: 28.7   Smokeless tobacco: Never  Vaping Use   Vaping Use: Never used  Substance and Sexual Activity   Alcohol use: Never   Drug use: Not Currently    Types: Marijuana    Comment: last used 1970   Sexual activity: Not on file  Other Topics Concern   Not on file  Social History Narrative   Not on  file   Social Determinants of Health   Financial Resource Strain: Not on file  Food Insecurity: Not on file  Transportation Needs: Not on file  Physical Activity: Not on file  Stress: Not on file  Social Connections: Not on file    Family History  Problem Relation Age of Onset   Hypertension Mother    Diabetes Mother    Heart disease Mother    Hyperlipidemia Mother    Cancer Father        lung    Outpatient Encounter Medications as of 03/26/2021  Medication Sig   ascorbic acid (VITAMIN C) 500 MG tablet Take 1 tablet (500 mg total) by mouth daily.   aspirin EC 81 MG tablet Take 1 tablet (81 mg total) by mouth daily with breakfast.   Continuous Blood Gluc Sensor (DEXCOM G6 SENSOR) MISC APPLY 1 SENSOR EVERY 10 DAYS FOR GLUCOSE MONITORING.   Dulaglutide (TRULICITY) 1.5 VZ/5.6LO SOPN Inject 1.5 mg into the skin once a week.   fenofibrate 160 MG tablet TAKE 1 TABLET BY MOUTH ONCE A DAY.   glipiZIDE (GLUCOTROL XL) 5  MG 24 hr tablet Take 1 tablet (5 mg total) by mouth daily with breakfast.   indapamide (LOZOL) 2.5 MG tablet Take 1 tablet (2.5 mg total) by mouth daily.   metoprolol succinate (TOPROL-XL) 100 MG 24 hr tablet TAKE (1) TABLET BY MOUTH ONCE DAILY.   mometasone (ELOCON) 0.1 % cream APPLY TO AFFECTED AREAS TWICE DAILY AS NEEDED.   Multiple Vitamin (MULTIVITAMIN) tablet Take 1 tablet by mouth daily.   oxyCODONE-acetaminophen (PERCOCET) 10-325 MG tablet Take one tablet po 4 times daily prn pain   oxyCODONE-acetaminophen (PERCOCET) 10-325 MG tablet Take one tablet po 4 times daily prn pain   oxyCODONE-acetaminophen (PERCOCET) 10-325 MG tablet Take one tablet po 4 times daily prn pain   rosuvastatin (CRESTOR) 40 MG tablet Take 1 tablet (40 mg total) by mouth daily.   sildenafil (REVATIO) 20 MG tablet Take 1-5 tablets before sexual activity as directed. No greater that 5 tablets in a day.   TECHLITE PEN NEEDLES 32G X 4 MM MISC INJECT 4 TIMES DAILY AS DIRECTED.   valsartan (DIOVAN)  160 MG tablet Take 1 tablet (160 mg total) by mouth daily.   zinc sulfate 220 (50 Zn) MG capsule Take 1 capsule (220 mg total) by mouth daily.   [DISCONTINUED] Continuous Blood Gluc Transmit (DEXCOM G6 TRANSMITTER) MISC USE AS DIRECTED.   [DISCONTINUED] insulin regular human CONCENTRATED (HUMULIN R U-500 KWIKPEN) 500 UNIT/ML KwikPen Inject 75 Units into the skin 3 (three) times daily with meals.   amLODipine (NORVASC) 10 MG tablet Take 1 tablet (10 mg total) by mouth daily.   Continuous Blood Gluc Transmit (DEXCOM G6 TRANSMITTER) MISC USE AS DIRECTED.   insulin regular human CONCENTRATED (HUMULIN R U-500 KWIKPEN) 500 UNIT/ML KwikPen Inject 80 Units into the skin 3 (three) times daily with meals.   No facility-administered encounter medications on file as of 03/26/2021.    ALLERGIES: Allergies  Allergen Reactions   Invokana [Canagliflozin] Other (See Comments)    laryngitis   Altace [Ramipril] Cough   Metformin And Related Diarrhea    VACCINATION STATUS: Immunization History  Administered Date(s) Administered   Influenza,inj,Quad PF,6+ Mos 01/30/2014, 01/29/2016, 01/30/2017, 01/26/2018, 01/23/2019, 01/17/2020, 01/01/2021   Influenza-Unspecified 01/26/2012, 02/21/2015   Pneumococcal Polysaccharide-23 01/30/2014   Td 12/19/2014    Diabetes He presents for his follow-up diabetic visit. He has type 2 diabetes mellitus. Onset time: Diagnosed at approximate age of 6. His disease course has been worsening. There are no hypoglycemic associated symptoms. Pertinent negatives for hypoglycemia include no nervousness/anxiousness, sweats or tremors. Associated symptoms include fatigue. Pertinent negatives for diabetes include no blurred vision, no polydipsia, no polyphagia and no polyuria. There are no hypoglycemic complications. Symptoms are stable. Diabetic complications include nephropathy. Risk factors for coronary artery disease include diabetes mellitus, dyslipidemia, hypertension, male sex,  obesity, stress and sedentary lifestyle. Current diabetic treatment includes intensive insulin program (and Trulicity). He is compliant with treatment most of the time. His weight is fluctuating minimally. He is following a generally unhealthy diet. When asked about meal planning, he reported none. He has not had a previous visit with a dietitian. He participates in exercise intermittently. His home blood glucose trend is increasing steadily. His breakfast blood glucose range is generally 180-200 mg/dl. His lunch blood glucose range is generally >200 mg/dl. His dinner blood glucose range is generally >200 mg/dl. His bedtime blood glucose range is generally >200 mg/dl. His overall blood glucose range is >200 mg/dl. (He presents today with his CGM, no logs, showing above target fasting and  postprandial glycemic profile.  His previsit A1c, done at his PCP office on 12/29 was 9.1% increasing from last visit of 8.9%.  He reports increased stress lately due to the holidays and recently losing his father-in-law.  He admits he still consumes diet soda and little water.  Analysis of his CGM shows TIR 12%, TAR 88%, TBR 0%.) An ACE inhibitor/angiotensin II receptor blocker is being taken. He does not see a podiatrist.Eye exam is not current.  Hypertension This is a chronic problem. The current episode started more than 1 year ago. The problem has been gradually improving since onset. The problem is controlled. Pertinent negatives include no blurred vision or sweats. There are no associated agents to hypertension. Risk factors for coronary artery disease include diabetes mellitus, dyslipidemia, male gender, obesity and stress. Past treatments include angiotensin blockers and calcium channel blockers. The current treatment provides mild improvement. Compliance problems include diet and exercise.  Hypertensive end-organ damage includes kidney disease. Identifiable causes of hypertension include chronic renal disease.   Hyperlipidemia This is a chronic problem. The current episode started more than 1 year ago. The problem is uncontrolled. Recent lipid tests were reviewed and are variable. Exacerbating diseases include chronic renal disease and diabetes. Factors aggravating his hyperlipidemia include fatty foods. Current antihyperlipidemic treatment includes statins and fibric acid derivatives. The current treatment provides moderate improvement of lipids. Compliance problems include adherence to diet, adherence to exercise and psychosocial issues.  Risk factors for coronary artery disease include diabetes mellitus, dyslipidemia, hypertension, male sex and obesity.   HYPERCALCEMIA Patient has no previously known history of parathyroid, pituitary, adrenal dysfunctions; no family history of such dysfunctions. -Review of his most recent labs reveals calcium of 10.9 the corresponding PTH of 11 on 02/15/21.  He does have slightly high albumin level of 5.1 on 12/18/20.  I reviewed pt's DEXA scans: none  No prior history of fragility fractures or falls. No history of kidney stones.  He does have history of CKD stage 3a (stable). Last BUN/Cr: 30/1.46  He is not on HCTZ or other thiazide therapy.  No history of vitamin D deficiency. Last vitamin D level was 40.6 on 02/15/21.  He is not on calcium supplements, eats dairy and green, leafy, vegetables on average amounts.  He does not have a family history of hypercalcemia, pituitary tumors, thyroid cancer, or osteoporosis.    Review of systems  Constitutional: + Minimally fluctuating body weight,  current Body mass index is 29.84 kg/m. , no fatigue, no subjective hyperthermia, no subjective hypothermia Eyes: no blurry vision, no xerophthalmia ENT: no sore throat, no nodules palpated in throat, no dysphagia/odynophagia, no hoarseness Cardiovascular: no chest pain, no shortness of breath, no palpitations, no leg swelling Respiratory: no cough, no shortness of  breath Gastrointestinal: no nausea/vomiting/diarrhea Musculoskeletal: no muscle/joint aches Skin: no rashes, no hyperemia Neurological: no tremors, no numbness, no tingling, no dizziness Psychiatric: no depression, no anxiety, increased stress level lately   Objective:     BP (!) 145/76    Pulse 88    Ht 5\' 10"  (1.778 m)    Wt 208 lb (94.3 kg)    SpO2 98%    BMI 29.84 kg/m   Wt Readings from Last 3 Encounters:  03/26/21 208 lb (94.3 kg)  03/08/21 206 lb 12.8 oz (93.8 kg)  01/01/21 209 lb 12.8 oz (95.2 kg)    BP Readings from Last 3 Encounters:  03/26/21 (!) 145/76  03/08/21 140/72  01/01/21 134/84    Physical Exam- Limited  Constitutional:  Body mass index is 29.84 kg/m. , not in acute distress, normal state of mind Eyes:  EOMI, no exophthalmos Neck: Supple Cardiovascular: RRR, no murmurs, rubs, or gallops, no edema Respiratory: Adequate breathing efforts, no crackles, rales, rhonchi, or wheezing Musculoskeletal: no gross deformities, strength intact in all four extremities, no gross restriction of joint movements Skin:  no rashes, no hyperemia Neurological: no tremor with outstretched hands    CMP ( most recent) CMP     Component Value Date/Time   NA 141 02/15/2021 0803   K 4.6 02/15/2021 0803   CL 99 02/15/2021 0803   CO2 26 02/15/2021 0803   GLUCOSE 212 (H) 02/15/2021 0803   GLUCOSE 156 (H) 04/10/2019 0430   BUN 30 (H) 02/15/2021 0803   CREATININE 1.46 (H) 02/15/2021 0803   CREATININE 0.91 02/25/2014 0727   CALCIUM 10.9 (H) 02/15/2021 0803   PROT 7.2 03/25/2021 0806   ALBUMIN 5.1 (H) 12/18/2020 0804   AST 32 12/18/2020 0804   ALT 37 12/18/2020 0804   ALKPHOS 40 (L) 12/18/2020 0804   BILITOT 0.3 12/18/2020 0804   GFRNONAA 57 (L) 01/17/2020 0928   GFRAA 66 01/17/2020 0928     Diabetic Labs (most recent): Lab Results  Component Value Date   HGBA1C 9.1 (H) 03/25/2021   HGBA1C 8.9 (H) 12/18/2020   HGBA1C 9.7 (A) 09/18/2020     Lipid Panel ( most  recent) Lipid Panel     Component Value Date/Time   CHOL 216 (H) 03/25/2021 0806   TRIG 1,100 (HH) 03/25/2021 0806   HDL 20 (L) 03/25/2021 0806   CHOLHDL 10.8 (H) 03/25/2021 0806   CHOLHDL 4.4 02/25/2014 0727   VLDL 35 02/25/2014 0727   LDLCALC Comment (A) 03/25/2021 0806   LABVLDL Comment (A) 03/25/2021 0806      Lab Results  Component Value Date   TSH 1.280 06/29/2020   TSH 1.470 11/11/2019   TSH 2.144 04/04/2019   TSH 0.986 07/01/2015   FREET4 1.39 06/29/2020   FREET4 1.44 11/11/2019           Assessment & Plan:   1) DM type 2 without retinopathy ([place])  - Terry [name] has currently uncontrolled symptomatic type 2 DM since  60 years of age.  He presents today with his CGM, no logs, showing above target fasting and postprandial glycemic profile.  His previsit A1c, done at his PCP office on 12/29 was 9.1% increasing from last visit of 8.9%.  He reports increased stress lately due to the holidays and recently losing his father-in-law.  He admits he still consumes diet soda and little water.  Analysis of his CGM shows TIR 12%, TAR 88%, TBR 0%.   Recent labs reviewed.  - I had a long discussion with him about the progressive nature of diabetes and the pathology behind its complications. -his diabetes is complicated by CKD stage 3a and he remains at a high risk for more acute and chronic complications which include CAD, CVA, CKD, retinopathy, and neuropathy. These are all discussed in detail with him.  - Nutritional counseling repeated at each appointment due to patients tendency to fall back in to old habits.  - The patient admits there is a room for improvement in their diet and drink choices. -  Suggestion is made for the patient to avoid simple carbohydrates from their diet including Cakes, Sweet Desserts / Pastries, Ice Cream, Soda (diet and regular), Sweet Tea, Candies, Chips, Cookies, Sweet Pastries, Store Bought Juices, Alcohol in  Excess of 1-2 drinks a day,  Artificial Sweeteners, Coffee Creamer, and "Sugar-free" Products. This will help patient to have stable blood glucose profile and potentially avoid unintended weight gain.   - I encouraged the patient to switch to unprocessed or minimally processed complex starch and increased protein intake (animal or plant source), fruits, and vegetables.   - Patient is advised to stick to a routine mealtimes to eat 3 meals a day and avoid unnecessary snacks (to snack only to correct hypoglycemia).  - I have approached him with the following individualized plan to manage  his diabetes and patient agrees:   -Based on his hyperglycemia overall, he will tolerate increase in his Humulin R- U500 to 75 units TID with meals if glucose is above 90 and he is eating.  His triglycerides have improved tremendously, thus allowing him to be a candidate for incretin therapy.  I discussed and initiated Trulicity 1.61 mg SQ weekly x 2 weeks (sample provided from office) then if tolerated he is to increase to 1.5 mg SQ weekly thereafter.  -He is encouraged to continue monitoring glucose 4 times daily, before meals and before bed, and to call the clinic if he has readings less than 70 or greater than 300 for 3 tests in a row.  - he is warned not to take insulin without proper monitoring per orders.  - Specific targets for  A1c;  LDL, HDL,  and Triglycerides were discussed with the patient.  2) Blood Pressure /Hypertension: His blood pressure is not controlled to target.  He says his BP is typically high in doctors offices due to white coat syndrome, but he had not long taken his medications prior to today's appointment.  He is advised to continue Diovan 160 mg, Amlodipine 10 mg po daily, Indapamide 2.5 mg po daily, and Metoprolol 100 mg po daily.      3) Lipids/Hyperlipidemia:    His most recent lipid panel from 03/25/21 shows significantly high triglycerides of 1100 (worsening). He is advised to continue Crestor 40 mg po daily  at bedtime and Fenofibrate 160 mg po daily.  Side effects and precautions discussed with him.  We discussed the need to avoid fried foods and butter to reduce his risk of cardiovascular events.  4)  Weight/Diet:  His Body mass index is 29.84 kg/m.  -   he is a candidate for weight loss. I discussed with him the fact that loss of 5 - 10% of his  current body weight will have the most impact on his diabetes management.  Exercise, and detailed carbohydrates information provided  -  detailed on discharge instructions.  5) Chronic Care/Health Maintenance: -he on ACEI/ARB and Statin medications and  is encouraged to initiate and continue to follow up with Ophthalmology, Dentist, Podiatrist at least yearly or according to recommendations, and advised to stay away from smoking. I have recommended yearly flu vaccine and pneumonia vaccine at least every 5 years; moderate intensity exercise for up to 150 minutes weekly; and  sleep for at least 7 hours a day.  - he is advised to maintain close follow up with Kathyrn Drown, MD for primary care needs, as well as his other providers for optimal and coordinated care.  6) Hypercalcemia:  Patient has had several instances of elevated calcium, with the highest level being at 10.9 mg/dL. A corresponding intact PTH level was low, at 11.  - Patient also has vitamin D deficiency- corrected with supplementation with the last level being 40.6.  -  No apparent complications from hypercalcemia/hyperparathyroidism: no history of  nephrolithiasis,  osteoporosis,fragility fractures. No abdominal pain, no major mood disorders, no bone pain.  - I discussed with the patient about the physiology of calcium and parathyroid hormone, and possible  effects of  increased PTH/ Calcium , including kidney stones, cardiac dysrhythmias, osteoporosis, abdominal pain, etc.   - The work up so far is not sufficient to reach a conclusion for definitive therapy.  He needs more studies to confirm  and classify the parathyroid dysfunction he may have. I will proceed to obtain repeat intact PTH/calcium, PTH-rp, serum magnesium, serum phosphorus, and ionized calcium (given his high albumin levels).  It is also essential to obtain 24-hour urine calcium/creatinine to rule out the rare but important cause of mild elevation in calcium and PTH- [place] ( Familial Hypocalciuric Hypercalcemia), which may not require any active intervention.  - I will request for his next DEXA scan to include the distal  33% of  radius for evaluation of cortical bone, which is predominantly affected by hyperparathyroidism.      I spent 45 minutes in the care of the patient today including review of labs from [facility], Lipids, Thyroid Function, Hematology (current and previous including abstractions from other facilities); face-to-face time discussing  his blood glucose readings/logs, discussing hypoglycemia and hyperglycemia episodes and symptoms, medications doses, his options of short and long term treatment based on the latest standards of care / guidelines;  discussion about incorporating lifestyle medicine;  and documenting the encounter.    Please refer to Patient Instructions for Blood Glucose Monitoring and Insulin/Medications Dosing Guide"  in media tab for additional information. Please  also refer to " Patient Self Inventory" in the Media  tab for reviewed elements of pertinent patient history.  Terry [name] participated in the discussions, expressed understanding, and voiced agreement with the above plans.  All questions were answered to his satisfaction. he is encouraged to contact clinic should he have any questions or concerns prior to his return visit.   Follow up plan: - Return in about 2 weeks (around 04/09/2021) for hypercalcemia follow up, Previsit labs, Diabetes F/U, Bring meter and logs.    Rayetta Pigg, [facility] [address] [place], [place] [address] Phone:  [phone] Fax: [phone]  03/26/2021, 9:18 AM

## 2021-04-01 LAB — CALCIUM, URINE, 24 HOUR
Calcium, 24H Urine: 158 mg/24 hr (ref 0–320)
Calcium, Urine: 9.3 mg/dL

## 2021-04-01 LAB — CREATININE, URINE, 24 HOUR
Creatinine, 24H Ur: 1571 mg/24 hr (ref 1000–2000)
Creatinine, Urine: 92.4 mg/dL

## 2021-04-05 LAB — PHOSPHORUS: Phosphorus: 3.1 mg/dL (ref 2.8–4.1)

## 2021-04-05 LAB — PTH, INTACT AND CALCIUM
Calcium: 10.4 mg/dL — ABNORMAL HIGH (ref 8.7–10.2)
PTH: 14 pg/mL — ABNORMAL LOW (ref 15–65)

## 2021-04-05 LAB — PTH-RELATED PEPTIDE: PTH-related peptide: <2 pmol/L

## 2021-04-05 LAB — MAGNESIUM: Magnesium: 1.7 mg/dL (ref 1.6–2.3)

## 2021-04-05 LAB — CALCIUM, IONIZED: Calcium, Ion: 5.3 mg/dL (ref 4.5–5.6)

## 2021-04-06 LAB — PE AND FLC, SERUM
A/G Ratio: 1.2 (ref 0.7–1.7)
Albumin ELP: 3.9 g/dL (ref 2.9–4.4)
Alpha 1: 0.2 g/dL (ref 0.0–0.4)
Alpha 2: 1.1 g/dL — ABNORMAL HIGH (ref 0.4–1.0)
Beta: 1.1 g/dL (ref 0.7–1.3)
Gamma Globulin: 0.9 g/dL (ref 0.4–1.8)
Globulin, Total: 3.3 g/dL (ref 2.2–3.9)
Ig Kappa Free Light Chain: 32 mg/L — ABNORMAL HIGH (ref 3.3–19.4)
Ig Lambda Free Light Chain: 17.7 mg/L (ref 5.7–26.3)
KAPPA/LAMBDA RATIO: 1.81 — ABNORMAL HIGH (ref 0.26–1.65)
Total Protein: 7.2 g/dL (ref 6.0–8.5)

## 2021-04-06 LAB — VITAMIN D 1,25 DIHYDROXY
Vitamin D 1, 25 (OH)2 Total: 33 pg/mL
Vitamin D2 1, 25 (OH)2: <10 pg/mL
Vitamin D3 1, 25 (OH)2: 29 pg/mL

## 2021-04-06 LAB — LIPID PANEL
Chol/HDL Ratio: 10.8 ratio — ABNORMAL HIGH (ref 0.0–5.0)
Cholesterol, Total: 216 mg/dL — ABNORMAL HIGH (ref 100–199)
HDL: 20 mg/dL — ABNORMAL LOW (ref >39)
Triglycerides: 1100 mg/dL (ref 0–149)

## 2021-04-06 LAB — PROTEIN ELECTROPHORESIS, SERUM

## 2021-04-06 LAB — HEMOGLOBIN A1C
Est. average glucose Bld gHb Est-mCnc: 214 mg/dL
Hgb A1c MFr Bld: 9.1 % — ABNORMAL HIGH (ref 4.8–5.6)

## 2021-04-06 LAB — PTH-RELATED PEPTIDE

## 2021-04-09 ENCOUNTER — Ambulatory Visit: Payer: BC Managed Care – PPO | Admitting: Nurse Practitioner

## 2021-04-09 ENCOUNTER — Encounter: Payer: Self-pay | Admitting: Nurse Practitioner

## 2021-04-09 VITALS — BP 126/67 | HR 77 | Ht 70.0 in | Wt 205.6 lb

## 2021-04-09 DIAGNOSIS — E119 Type 2 diabetes mellitus without complications: Secondary | ICD-10-CM | POA: Diagnosis not present

## 2021-04-09 DIAGNOSIS — E782 Mixed hyperlipidemia: Secondary | ICD-10-CM | POA: Diagnosis not present

## 2021-04-09 DIAGNOSIS — I10 Essential (primary) hypertension: Secondary | ICD-10-CM | POA: Diagnosis not present

## 2021-04-09 MED ORDER — HUMULIN R U-500 KWIKPEN 500 UNIT/ML ~~LOC~~ SOPN: 70.0000 [IU] | PEN_INJECTOR | Freq: Three times a day (TID) | SUBCUTANEOUS | 0 refills | Status: DC

## 2021-04-09 NOTE — Progress Notes (Signed)
Endocrinology Follow Up Visit      04/09/2021, 8:59 AM   Subjective:    Patient ID: Terry [name], male    DOB: March 28, 1961.  Terry [name] is being seen in follow up after being seen in consultation for management of currently uncontrolled symptomatic diabetes requested by  Kathyrn Drown, MD.   Past Medical History:  Diagnosis Date   Diabetes mellitus without complication ([place]) 8616   Hyperlipidemia    Hypertension    Osteoarthritis     Past Surgical History:  Procedure Laterality Date   ANKLE SURGERY Left 03/28/2005   2 plates/screws   COLONOSCOPY N/A 08/04/2017   Procedure: COLONOSCOPY;  Surgeon: Rogene Houston, MD;  [facility];  Service: Endoscopy;  Laterality: N/A;   ESOPHAGEAL DILATION N/A 08/04/2017   Procedure: ESOPHAGEAL DILATION;  Surgeon: Rogene Houston, MD;  [facility];  Service: Endoscopy;  Laterality: N/A;   ESOPHAGOGASTRODUODENOSCOPY N/A 08/04/2017   Procedure: ESOPHAGOGASTRODUODENOSCOPY (EGD);  Surgeon: Rogene Houston, MD;  [facility];  Service: Endoscopy;  Laterality: N/A;   POLYPECTOMY  08/04/2017   Procedure: POLYPECTOMY;  Surgeon: Rogene Houston, MD;  [facility];  Service: Endoscopy;;   ROOT CANAL      Social History   Socioeconomic History   Marital status: Married    Spouse name: Not on file   Number of children: Not on file   Years of education: Not on file   Highest education level: Not on file  Occupational History   Not on file  Tobacco Use   Smoking status: Former    Types: Cigarettes    Quit date: 06/15/1992    Years since quitting: 28.8   Smokeless tobacco: Never  Vaping Use   Vaping Use: Never used  Substance and Sexual Activity   Alcohol use: Never   Drug use: Not Currently    Types: Marijuana    Comment: last used 1970   Sexual activity: Not on file  Other Topics Concern   Not on file  Social  History Narrative   Not on file   Social Determinants of Health   Financial Resource Strain: Not on file  Food Insecurity: Not on file  Transportation Needs: Not on file  Physical Activity: Not on file  Stress: Not on file  Social Connections: Not on file    Family History  Problem Relation Age of Onset   Hypertension Mother    Diabetes Mother    Heart disease Mother    Hyperlipidemia Mother    Cancer Father        lung    Outpatient Encounter Medications as of 04/09/2021  Medication Sig   ascorbic acid (VITAMIN C) 500 MG tablet Take 1 tablet (500 mg total) by mouth daily.   aspirin EC 81 MG tablet Take 1 tablet (81 mg total) by mouth daily with breakfast.   Continuous Blood Gluc Sensor (DEXCOM G6 SENSOR) MISC APPLY 1 SENSOR EVERY 10 DAYS FOR GLUCOSE MONITORING.   Continuous Blood Gluc Transmit (DEXCOM G6 TRANSMITTER) MISC USE AS DIRECTED.   Dulaglutide (TRULICITY) 1.5 OH/7.2BM SOPN Inject 1.5 mg into the skin once a week.  fenofibrate 160 MG tablet TAKE 1 TABLET BY MOUTH ONCE A DAY.   glipiZIDE (GLUCOTROL XL) 5 MG 24 hr tablet Take 1 tablet (5 mg total) by mouth daily with breakfast.   indapamide (LOZOL) 2.5 MG tablet Take 1 tablet (2.5 mg total) by mouth daily.   metoprolol succinate (TOPROL-XL) 100 MG 24 hr tablet TAKE (1) TABLET BY MOUTH ONCE DAILY.   mometasone (ELOCON) 0.1 % cream APPLY TO AFFECTED AREAS TWICE DAILY AS NEEDED.   Multiple Vitamin (MULTIVITAMIN) tablet Take 1 tablet by mouth daily.   oxyCODONE-acetaminophen (PERCOCET) 10-325 MG tablet Take one tablet po 4 times daily prn pain   oxyCODONE-acetaminophen (PERCOCET) 10-325 MG tablet Take one tablet po 4 times daily prn pain   oxyCODONE-acetaminophen (PERCOCET) 10-325 MG tablet Take one tablet po 4 times daily prn pain   rosuvastatin (CRESTOR) 40 MG tablet Take 1 tablet (40 mg total) by mouth daily.   sildenafil (REVATIO) 20 MG tablet Take 1-5 tablets before sexual activity as directed. No greater that 5  tablets in a day.   TECHLITE PEN NEEDLES 32G X 4 MM MISC INJECT 4 TIMES DAILY AS DIRECTED.   valsartan (DIOVAN) 160 MG tablet Take 1 tablet (160 mg total) by mouth daily.   zinc sulfate 220 (50 Zn) MG capsule Take 1 capsule (220 mg total) by mouth daily.   [DISCONTINUED] insulin regular human CONCENTRATED (HUMULIN R U-500 KWIKPEN) 500 UNIT/ML KwikPen Inject 80 Units into the skin 3 (three) times daily with meals.   amLODipine (NORVASC) 10 MG tablet Take 1 tablet (10 mg total) by mouth daily.   insulin regular human CONCENTRATED (HUMULIN R U-500 KWIKPEN) 500 UNIT/ML KwikPen Inject 70-80 Units into the skin 3 (three) times daily with meals.   No facility-administered encounter medications on file as of 04/09/2021.    ALLERGIES: Allergies  Allergen Reactions   Invokana [Canagliflozin] Other (See Comments)    laryngitis   Altace [Ramipril] Cough   Metformin And Related Diarrhea    VACCINATION STATUS: Immunization History  Administered Date(s) Administered   Influenza,inj,Quad PF,6+ Mos 01/30/2014, 01/29/2016, 01/30/2017, 01/26/2018, 01/23/2019, 01/17/2020, 01/01/2021   Influenza-Unspecified 01/26/2012, 02/21/2015   Pneumococcal Polysaccharide-23 01/30/2014   Td 12/19/2014    Diabetes He presents for his follow-up diabetic visit. He has type 2 diabetes mellitus. Onset time: Diagnosed at approximate age of 35. His disease course has been improving. Hypoglycemia symptoms include sweats and tremors. Pertinent negatives for hypoglycemia include no nervousness/anxiousness. Associated symptoms include fatigue. Pertinent negatives for diabetes include no blurred vision, no polydipsia, no polyphagia and no polyuria. Hypoglycemia complications include nocturnal hypoglycemia (mild). Symptoms are stable. Diabetic complications include nephropathy. Risk factors for coronary artery disease include diabetes mellitus, dyslipidemia, hypertension, male sex, obesity, stress and sedentary lifestyle. Current  diabetic treatment includes intensive insulin program and oral agent (monotherapy) (and Trulicity). He is compliant with treatment most of the time. His weight is fluctuating minimally. He is following a generally unhealthy diet. When asked about meal planning, he reported none. He has not had a previous visit with a dietitian. He participates in exercise intermittently. His home blood glucose trend is decreasing steadily. His overall blood glucose range is >200 mg/dl. (He presents today with his CGM and logs showing improving glycemic profile overall.  He was not due for another A1c today.  He is tolerating the addition of Trulicity well.  He does report some mild nocturnal hypoglycemia in the 60s.  He is working hard on cutting out sodas and drinking more water.) An  ACE inhibitor/angiotensin II receptor blocker is being taken. He does not see a podiatrist.Eye exam is not current.  Hypertension This is a chronic problem. The current episode started more than 1 year ago. The problem has been gradually improving since onset. The problem is controlled. Associated symptoms include sweats. Pertinent negatives include no blurred vision. There are no associated agents to hypertension. Risk factors for coronary artery disease include diabetes mellitus, dyslipidemia, male gender, obesity and stress. Past treatments include angiotensin blockers and calcium channel blockers. The current treatment provides mild improvement. Compliance problems include diet and exercise.  Hypertensive end-organ damage includes kidney disease. Identifiable causes of hypertension include chronic renal disease.  Hyperlipidemia This is a chronic problem. The current episode started more than 1 year ago. The problem is uncontrolled. Recent lipid tests were reviewed and are variable. Exacerbating diseases include chronic renal disease and diabetes. Factors aggravating his hyperlipidemia include fatty foods. Current antihyperlipidemic treatment  includes statins and fibric acid derivatives. The current treatment provides moderate improvement of lipids. Compliance problems include adherence to diet, adherence to exercise and psychosocial issues.  Risk factors for coronary artery disease include diabetes mellitus, dyslipidemia, hypertension, male sex and obesity.   HYPERCALCEMIA Patient has no previously known history of parathyroid, pituitary, adrenal dysfunctions; no family history of such dysfunctions. -Review of his most recent labs reveals calcium of 10.9 the corresponding PTH of 11 on 02/15/21.  He does have slightly high albumin level of 5.1 on 12/18/20.  I reviewed pt's DEXA scans: none  No prior history of fragility fractures or falls. No history of kidney stones.  He does have history of CKD stage 3a (stable). Last BUN/Cr: 30/1.46  He is not on HCTZ or other thiazide therapy.  No history of vitamin D deficiency. Last vitamin D level was 40.6 on 02/15/21.  He is not on calcium supplements, eats dairy and green, leafy, vegetables on average amounts.  He does not have a family history of hypercalcemia, pituitary tumors, thyroid cancer, or osteoporosis.    Review of systems  Constitutional: + Minimally fluctuating body weight,  current Body mass index is 29.5 kg/m. , no fatigue, no subjective hyperthermia, no subjective hypothermia Eyes: no blurry vision, no xerophthalmia ENT: no sore throat, no nodules palpated in throat, no dysphagia/odynophagia, no hoarseness Cardiovascular: no chest pain, no shortness of breath, no palpitations, no leg swelling Respiratory: no cough, no shortness of breath Gastrointestinal: no nausea/vomiting/diarrhea Musculoskeletal: no muscle/joint aches Skin: no rashes, no hyperemia Neurological: no tremors, no numbness, no tingling, no dizziness Psychiatric: no depression, no anxiety, increased stress level lately   Objective:     BP 126/67    Pulse 77    Ht 5\' 10"  (1.778 m)    Wt 205 lb 9.6  oz (93.3 kg)    SpO2 99%    BMI 29.50 kg/m   Wt Readings from Last 3 Encounters:  04/09/21 205 lb 9.6 oz (93.3 kg)  03/26/21 208 lb (94.3 kg)  03/08/21 206 lb 12.8 oz (93.8 kg)    BP Readings from Last 3 Encounters:  04/09/21 126/67  03/26/21 (!) 145/76  03/08/21 140/72     Physical Exam- Limited  Constitutional:  Body mass index is 29.5 kg/m. , not in acute distress, normal state of mind Eyes:  EOMI, no exophthalmos Neck: Supple Cardiovascular: RRR, no murmurs, rubs, or gallops, no edema Respiratory: Adequate breathing efforts, no crackles, rales, rhonchi, or wheezing Musculoskeletal: no gross deformities, strength intact in all four extremities, no gross restriction of  joint movements Skin:  no rashes, no hyperemia Neurological: no tremor with outstretched hands    CMP ( most recent) CMP     Component Value Date/Time   NA 141 02/15/2021 0803   K 4.6 02/15/2021 0803   CL 99 02/15/2021 0803   CO2 26 02/15/2021 0803   GLUCOSE 212 (H) 02/15/2021 0803   GLUCOSE 156 (H) 04/10/2019 0430   BUN 30 (H) 02/15/2021 0803   CREATININE 1.46 (H) 02/15/2021 0803   CREATININE 0.91 02/25/2014 0727   CALCIUM 10.4 (H) 03/26/2021 0905   PROT 7.2 03/25/2021 0806   ALBUMIN 5.1 (H) 12/18/2020 0804   AST 32 12/18/2020 0804   ALT 37 12/18/2020 0804   ALKPHOS 40 (L) 12/18/2020 0804   BILITOT 0.3 12/18/2020 0804   GFRNONAA 57 (L) 01/17/2020 0928   GFRAA 66 01/17/2020 0928     Diabetic Labs (most recent): Lab Results  Component Value Date   HGBA1C 9.1 (H) 03/25/2021   HGBA1C 8.9 (H) 12/18/2020   HGBA1C 9.7 (A) 09/18/2020     Lipid Panel ( most recent) Lipid Panel     Component Value Date/Time   CHOL 216 (H) 03/25/2021 0806   TRIG 1,100 (HH) 03/25/2021 0806   HDL 20 (L) 03/25/2021 0806   CHOLHDL 10.8 (H) 03/25/2021 0806   CHOLHDL 4.4 02/25/2014 0727   VLDL 35 02/25/2014 0727   LDLCALC Comment (A) 03/25/2021 0806   LABVLDL Comment (A) 03/25/2021 0806      Lab Results   Component Value Date   TSH 1.280 06/29/2020   TSH 1.470 11/11/2019   TSH 2.144 04/04/2019   TSH 0.986 07/01/2015   FREET4 1.39 06/29/2020   FREET4 1.44 11/11/2019           Assessment & Plan:   1) DM type 2 without retinopathy ([place])  - Terry [name] has currently uncontrolled symptomatic type 2 DM since  61 years of age.  He presents today with his CGM and logs showing improving glycemic profile overall.  He was not due for another A1c today.  He is tolerating the addition of Trulicity well.  He does report some mild nocturnal hypoglycemia in the 60s.  He is working hard on cutting out sodas and drinking more water.  Analysis of his CGM shows TIR 31%, TAR 78%, TBR < 1% with a GMI of 9%.   Recent labs reviewed.  - I had a long discussion with him about the progressive nature of diabetes and the pathology behind its complications. -his diabetes is complicated by CKD stage 3a and he remains at a high risk for more acute and chronic complications which include CAD, CVA, CKD, retinopathy, and neuropathy. These are all discussed in detail with him.  - Nutritional counseling repeated at each appointment due to patients tendency to fall back in to old habits.  - The patient admits there is a room for improvement in their diet and drink choices. -  Suggestion is made for the patient to avoid simple carbohydrates from their diet including Cakes, Sweet Desserts / Pastries, Ice Cream, Soda (diet and regular), Sweet Tea, Candies, Chips, Cookies, Sweet Pastries, Store Bought Juices, Alcohol in Excess of 1-2 drinks a day, Artificial Sweeteners, Coffee Creamer, and "Sugar-free" Products. This will help patient to have stable blood glucose profile and potentially avoid unintended weight gain.   - I encouraged the patient to switch to unprocessed or minimally processed complex starch and increased protein intake (animal or plant source), fruits, and vegetables.   -  Patient is advised to stick to a  routine mealtimes to eat 3 meals a day and avoid unnecessary snacks (to snack only to correct hypoglycemia).  - I have approached him with the following individualized plan to manage  his diabetes and patient agrees:   -Based on his hyperglycemia overall, he will tolerate increase in his Humulin R- U500 to 80 units with breakfast and lunch and decrease his dinner dose to 70 units to avoid nocturnal hypoglycemia, if glucose is above 90 and he is eating.  He is tolerating the Trulicity well, is advised to continue 1.5 mg SQ weekly (will not tolerate higher doses due to elevated triglycerides).  He can also continue his Glipizide 5 mg XL daily with breakfast.  -He is encouraged to continue monitoring glucose 4 times daily, before meals and before bed, and to call the clinic if he has readings less than 70 or greater than 300 for 3 tests in a row.  - he is warned not to take insulin without proper monitoring per orders.  - Specific targets for  A1c;  LDL, HDL,  and Triglycerides were discussed with the patient.  2) Blood Pressure /Hypertension: His blood pressure is controlled to target.  He is advised to continue Diovan 160 mg, Amlodipine 10 mg po daily, Indapamide 2.5 mg po daily, and Metoprolol 100 mg po daily.      3) Lipids/Hyperlipidemia:    His most recent lipid panel from 03/25/21 shows significantly high triglycerides of 1100 (worsening). He is advised to continue Crestor 40 mg po daily at bedtime and Fenofibrate 160 mg po daily.  Side effects and precautions discussed with him.  We discussed the need to avoid fried foods and butter to reduce his risk of cardiovascular events.  4)  Weight/Diet:  His Body mass index is 29.5 kg/m.  -   he is a candidate for weight loss. I discussed with him the fact that loss of 5 - 10% of his  current body weight will have the most impact on his diabetes management.  Exercise, and detailed carbohydrates information provided  -  detailed on discharge  instructions.  5) Chronic Care/Health Maintenance: -he on ACEI/ARB and Statin medications and  is encouraged to initiate and continue to follow up with Ophthalmology, Dentist, Podiatrist at least yearly or according to recommendations, and advised to stay away from smoking. I have recommended yearly flu vaccine and pneumonia vaccine at least every 5 years; moderate intensity exercise for up to 150 minutes weekly; and  sleep for at least 7 hours a day.  - he is advised to maintain close follow up with Kathyrn Drown, MD for primary care needs, as well as his other providers for optimal and coordinated care.  6) Hypercalcemia:  Patient has had several instances of elevated calcium, with the highest level being at 10.9 mg/dL. A corresponding intact PTH level was low, at 11.   - Patient also has vitamin D deficiency- corrected with supplementation with the last level being 40.6.  - No apparent complications from hypercalcemia/hyperparathyroidism: no history of  nephrolithiasis,  osteoporosis,fragility fractures. No abdominal pain, no major mood disorders, no bone pain.  - I discussed with the patient about the physiology of calcium and parathyroid hormone, and possible effects of  increased PTH/ Calcium , including kidney stones, cardiac dysrhythmias, osteoporosis, abdominal pain, etc.   - The work up so far is not sufficient to reach a conclusion for definitive therapy.  He needs more studies to confirm and  classify the parathyroid dysfunction he may have.  - His urine studies were normal ruling out [place] as a potential cause for his hypercalcemia and it is unlikely that it is his parathyroid glands due to low PTH level.  - At this time the etiology of his hypercalcemia does not appear to be endocrine related.  His PCP has already initiated consult to hematologist/oncologist to investigate for other potential causes.  I recommended he proceed with the new consult as planned.     I spent 41 minutes  in the care of the patient today including review of labs from [facility], Lipids, Thyroid Function, Hematology (current and previous including abstractions from other facilities); face-to-face time discussing  his blood glucose readings/logs, discussing hypoglycemia and hyperglycemia episodes and symptoms, medications doses, his options of short and long term treatment based on the latest standards of care / guidelines;  discussion about incorporating lifestyle medicine;  and documenting the encounter.    Please refer to Patient Instructions for Blood Glucose Monitoring and Insulin/Medications Dosing Guide"  in media tab for additional information. Please  also refer to " Patient Self Inventory" in the Media  tab for reviewed elements of pertinent patient history.  Terry [name] participated in the discussions, expressed understanding, and voiced agreement with the above plans.  All questions were answered to his satisfaction. he is encouraged to contact clinic should he have any questions or concerns prior to his return visit.   Follow up plan: - Return in about 3 months (around 07/08/2021) for Diabetes F/U with A1c in office, No previsit labs, Bring meter and logs.    Rayetta Pigg, [facility] [address] [place], [place] [address] Phone: [phone] Fax: [phone]  04/09/2021, 8:59 AM

## 2021-04-12 ENCOUNTER — Telehealth: Payer: Self-pay | Admitting: *Deleted

## 2021-04-12 DIAGNOSIS — R778 Other specified abnormalities of plasma proteins: Secondary | ICD-10-CM

## 2021-04-12 NOTE — Telephone Encounter (Signed)
Patient advised per Dr Nicki Reaper: Terry [name]: Dr Nicki Reaper received his most recent visit with the diabetic doctor.  In the nurse practitioner's note it states that they do not feel that his elevated calcium is due to endocrinology issues.  Given his serum electrophoresis they do recommend hematology consult.  Dr Nicki Reaper would recommend consulting Dr. Delton Coombes due to abnormal serum protein electrophoresis as well as elevated calcium-this is important to figure out why this is happening and make sure that there is not a serious underlying issue.-if the patient for some reason declines this please let me know and document accordingly  Patient verbalized understanding and would like to proceed with referral to hematology. Referral ordered in Epic.

## 2021-04-12 NOTE — Telephone Encounter (Signed)
Terry Drown, MD : Please let patient know that I received his most recent visit with the diabetic doctor.  In the nurse practitioner's note it states that they do not feel that his elevated calcium is due to endocrinology issues.  Given his serum electrophoresis they do recommend hematology consult.  I would recommend consulting Dr. Delton Coombes due to abnormal serum protein electrophoresis as well as elevated calcium-this is important to figure out why this is happening and make sure that there is not a serious underlying issue.-if the patient for some reason declines this please let me know and document accordingly

## 2021-04-13 ENCOUNTER — Other Ambulatory Visit: Payer: Self-pay | Admitting: Nurse Practitioner

## 2021-04-30 ENCOUNTER — Inpatient Hospital Stay (HOSPITAL_COMMUNITY): Payer: BC Managed Care – PPO | Attending: Hematology | Admitting: Hematology

## 2021-04-30 ENCOUNTER — Inpatient Hospital Stay (HOSPITAL_COMMUNITY): Payer: BC Managed Care – PPO

## 2021-04-30 ENCOUNTER — Encounter (HOSPITAL_COMMUNITY): Payer: Self-pay | Admitting: Hematology

## 2021-04-30 ENCOUNTER — Other Ambulatory Visit: Payer: Self-pay

## 2021-04-30 DIAGNOSIS — Z87891 Personal history of nicotine dependence: Secondary | ICD-10-CM | POA: Insufficient documentation

## 2021-04-30 DIAGNOSIS — Z79899 Other long term (current) drug therapy: Secondary | ICD-10-CM | POA: Diagnosis not present

## 2021-04-30 DIAGNOSIS — N189 Chronic kidney disease, unspecified: Secondary | ICD-10-CM | POA: Diagnosis not present

## 2021-04-30 DIAGNOSIS — R61 Generalized hyperhidrosis: Secondary | ICD-10-CM | POA: Diagnosis not present

## 2021-04-30 DIAGNOSIS — E1122 Type 2 diabetes mellitus with diabetic chronic kidney disease: Secondary | ICD-10-CM | POA: Diagnosis not present

## 2021-04-30 DIAGNOSIS — Z801 Family history of malignant neoplasm of trachea, bronchus and lung: Secondary | ICD-10-CM | POA: Diagnosis not present

## 2021-04-30 DIAGNOSIS — I129 Hypertensive chronic kidney disease with stage 1 through stage 4 chronic kidney disease, or unspecified chronic kidney disease: Secondary | ICD-10-CM | POA: Insufficient documentation

## 2021-04-30 LAB — PHOSPHORUS: Phosphorus: 3.9 mg/dL (ref 2.5–4.6)

## 2021-04-30 LAB — TSH: TSH: 0.713 u[IU]/mL (ref 0.350–4.500)

## 2021-04-30 LAB — LACTATE DEHYDROGENASE: LDH: 182 U/L (ref 98–192)

## 2021-04-30 LAB — PSA: Prostatic Specific Antigen: 0.14 ng/mL (ref 0.00–4.00)

## 2021-04-30 NOTE — Progress Notes (Signed)
[place] [address]  [address]   CLINIC:  Medical Oncology/Hematology  Patient Care Team: Kathyrn Drown, MD as PCP - General (Family Medicine) Derek Jack, MD as Medical Oncologist (Hematology)  CHIEF COMPLAINTS/PURPOSE OF CONSULTATION:  Evaluation of non-parathyroid hypercalcemia   HISTORY OF PRESENTING ILLNESS:  Terry [name] 61 y.o. male is here because of evaluation of non-parathyroid hypercalcemia, at the request of Dr. Wolfgang Phoenix.  Today he reports feeling good. He denies fevers and weight loss, but reports drenching night sweats occurring about every other day for the past 6 months. He takes a multivitamin which contains calcium and has taken it for many years. He takes Tums and Protonix prn for indigestion. He denies use of protein supplements. He reports chronic hip, knee, and ankle pains, and he denies new pains.   He currently works as a Games developer. He quit smoking 35 years ago. His father had lung cancer. He denies family history of leukemia, myeloma, hypercalcemia, and sarcoidosis.   MEDICAL HISTORY:  Past Medical History:  Diagnosis Date   Diabetes mellitus without complication ([place]) 1740   Hyperlipidemia    Hypertension    Osteoarthritis     SURGICAL HISTORY: Past Surgical History:  Procedure Laterality Date   ANKLE SURGERY Left 03/28/2005   2 plates/screws   COLONOSCOPY N/A 08/04/2017   Procedure: COLONOSCOPY;  Surgeon: Rogene Houston, MD;  [facility];  Service: Endoscopy;  Laterality: N/A;   ESOPHAGEAL DILATION N/A 08/04/2017   Procedure: ESOPHAGEAL DILATION;  Surgeon: Rogene Houston, MD;  [facility];  Service: Endoscopy;  Laterality: N/A;   ESOPHAGOGASTRODUODENOSCOPY N/A 08/04/2017   Procedure: ESOPHAGOGASTRODUODENOSCOPY (EGD);  Surgeon: Rogene Houston, MD;  [facility];  Service: Endoscopy;  Laterality: N/A;   POLYPECTOMY  08/04/2017   Procedure: POLYPECTOMY;  Surgeon:  Rogene Houston, MD;  [facility];  Service: Endoscopy;;   ROOT CANAL      SOCIAL HISTORY: Social History   Socioeconomic History   Marital status: Married    Spouse name: Not on file   Number of children: Not on file   Years of education: Not on file   Highest education level: Not on file  Occupational History   Not on file  Tobacco Use   Smoking status: Former    Types: Cigarettes    Quit date: 06/15/1992    Years since quitting: 28.8   Smokeless tobacco: Never  Vaping Use   Vaping Use: Never used  Substance and Sexual Activity   Alcohol use: Never   Drug use: Not Currently    Types: Marijuana    Comment: last used 1970   Sexual activity: Not on file  Other Topics Concern   Not on file  Social History Narrative   Not on file   Social Determinants of Health   Financial Resource Strain: Not on file  Food Insecurity: Not on file  Transportation Needs: Not on file  Physical Activity: Not on file  Stress: Not on file  Social Connections: Not on file  Intimate Partner Violence: Not on file    FAMILY HISTORY: Family History  Problem Relation Age of Onset   Hypertension Mother    Diabetes Mother    Heart disease Mother    Hyperlipidemia Mother    Cancer Father        lung    ALLERGIES:  is allergic to invokana [canagliflozin], altace [ramipril], and metformin and related.  MEDICATIONS:  Current Outpatient Medications  Medication Sig Dispense Refill   ascorbic acid (VITAMIN C) 500 MG tablet Take 1 tablet (500 mg total) by mouth daily. 30 tablet 2   aspirin EC 81 MG tablet Take 1 tablet (81 mg total) by mouth daily with breakfast. 120 tablet 3   Continuous Blood Gluc Sensor (DEXCOM G6 SENSOR) MISC APPLY 1 SENSOR EVERY 10 DAYS FOR GLUCOSE MONITORING. 3 each 2   Continuous Blood Gluc Transmit (DEXCOM G6 TRANSMITTER) MISC USE AS DIRECTED. 1 each 3   Dulaglutide (TRULICITY) 1.5 JK/8.2SU SOPN Inject 1.5 mg into the skin once a week. 6 mL 3    fenofibrate 160 MG tablet TAKE 1 TABLET BY MOUTH ONCE A DAY. 90 tablet 0   glipiZIDE (GLUCOTROL XL) 5 MG 24 hr tablet Take 1 tablet (5 mg total) by mouth daily with breakfast. 90 tablet 3   indapamide (LOZOL) 2.5 MG tablet Take 1 tablet (2.5 mg total) by mouth daily. 90 tablet 1   insulin regular human CONCENTRATED (HUMULIN R U-500 KWIKPEN) 500 UNIT/ML KwikPen Inject 70-80 Units into the skin 3 (three) times daily with meals. 41 mL 0   metoprolol succinate (TOPROL-XL) 100 MG 24 hr tablet TAKE (1) TABLET BY MOUTH ONCE DAILY. 90 tablet 1   mometasone (ELOCON) 0.1 % cream APPLY TO AFFECTED AREAS TWICE DAILY AS NEEDED. 45 g 2   Multiple Vitamin (MULTIVITAMIN) tablet Take 1 tablet by mouth daily.     oxyCODONE-acetaminophen (PERCOCET) 10-325 MG tablet Take one tablet po 4 times daily prn pain 120 tablet 0   oxyCODONE-acetaminophen (PERCOCET) 10-325 MG tablet Take one tablet po 4 times daily prn pain 120 tablet 0   oxyCODONE-acetaminophen (PERCOCET) 10-325 MG tablet Take one tablet po 4 times daily prn pain 120 tablet 0   rosuvastatin (CRESTOR) 40 MG tablet Take 1 tablet (40 mg total) by mouth daily. 90 tablet 1   sildenafil (REVATIO) 20 MG tablet Take 1-5 tablets before sexual activity as directed. No greater that 5 tablets in a day. 25 tablet 6   TECHLITE PEN NEEDLES 32G X 4 MM MISC INJECT 4 TIMES DAILY AS DIRECTED. 100 each PRN   valsartan (DIOVAN) 160 MG tablet Take 1 tablet (160 mg total) by mouth daily. 90 tablet 1   zinc sulfate 220 (50 Zn) MG capsule Take 1 capsule (220 mg total) by mouth daily. 30 capsule 1   amLODipine (NORVASC) 10 MG tablet Take 1 tablet (10 mg total) by mouth daily. 90 tablet 3   No current facility-administered medications for this visit.    REVIEW OF SYSTEMS:   Review of Systems  Constitutional:  Negative for appetite change, fatigue, fever and unexpected weight change.  Gastrointestinal:  Positive for nausea.  Endocrine: Positive for hot flashes (night sweats).   Musculoskeletal:  Positive for arthralgias (8/10 hips/ankles/knees).  All other systems reviewed and are negative.   PHYSICAL EXAMINATION: ECOG PERFORMANCE STATUS: 0 - Asymptomatic  Vitals:   04/30/21 0804  BP: 117/67  Pulse: 79  Resp: 16  Temp: 97.9 F (36.6 C)  SpO2: 99%   Filed Weights   04/30/21 0804  Weight: 205 lb 11 oz (93.3 kg)   Physical Exam Vitals reviewed.  Constitutional:      Appearance: Normal appearance.  Cardiovascular:     Rate and Rhythm: Normal rate and regular rhythm.     Pulses: Normal pulses.     Heart sounds: Murmur heard.  Pulmonary:     Effort: Pulmonary effort is normal.  Breath sounds: Normal breath sounds.  Abdominal:     Palpations: Abdomen is soft. There is no hepatomegaly, splenomegaly or mass.     Tenderness: There is no abdominal tenderness.  Musculoskeletal:     Right lower leg: No edema.     Left lower leg: No edema.  Lymphadenopathy:     Lower Body: No right inguinal adenopathy. No left inguinal adenopathy.  Neurological:     General: No focal deficit present.     Mental Status: He is alert and oriented to person, place, and time.  Psychiatric:        Mood and Affect: Mood normal.        Behavior: Behavior normal.     LABORATORY DATA:  I have reviewed the data as listed Recent Results (from the past 2160 hour(s))  Vitamin D (25 hydroxy)     Status: None   Collection Time: 02/15/21  8:03 AM  Result Value Ref Range   Vit D, 25-Hydroxy 40.6 30.0 - 100.0 ng/mL    Comment: Vitamin D deficiency has been defined by the [place] practice guideline as a level of serum 25-OH vitamin D less than 20 ng/mL (1,2). The Endocrine Society went on to further define vitamin D insufficiency as a level between 21 and 29 ng/mL (2). 1. IOM (Institute of Medicine). 2010. Dietary reference    intakes for calcium and D. [place]: The    [employer]. 2. Holick MF, Binkley Nora,  Bischoff-Ferrari HA, et al.    Evaluation, treatment, and prevention of vitamin D    deficiency: an Endocrine Society clinical practice    guideline. JCEM. 2011 Jul; 96(7):1911-30.   PTH, Intact and Calcium     Status: Abnormal   Collection Time: 02/15/21  8:03 AM  Result Value Ref Range   PTH 11 (L) 15 - 65 pg/mL   PTH Interp Comment     Comment: Interpretation                 Intact PTH    Calcium                                 (pg/mL)      (mg/dL) Normal                          15 - 65     8.6 - 10.2 Primary Hyperparathyroidism         >65          >10.2 Secondary Hyperparathyroidism       >65          <10.2 Non-Parathyroid Hypercalcemia       <65          >10.2 Hypoparathyroidism                  <15          < 8.6 Non-Parathyroid Hypocalcemia    15 - 65          < 8.6   Calcium, ionized     Status: Abnormal   Collection Time: 02/15/21  8:03 AM  Result Value Ref Range   Calcium, Ion 5.7 (H) 4.5 - 5.6 mg/dL  Basic Metabolic Panel (BMET)     Status: Abnormal   Collection Time: 02/15/21  8:03 AM  Result Value Ref Range   Glucose  212 (H) 70 - 99 mg/dL   BUN 30 (H) 6 - 24 mg/dL   Creatinine, Ser 1.46 (H) 0.76 - 1.27 mg/dL   eGFR 55 (L) >59 mL/min/1.73   BUN/Creatinine Ratio 21 (H) 9 - 20   Sodium 141 134 - 144 mmol/L   Potassium 4.6 3.5 - 5.2 mmol/L   Chloride 99 96 - 106 mmol/L   CO2 26 20 - 29 mmol/L   Calcium 10.9 (H) 8.7 - 10.2 mg/dL  PE and FLC, Serum     Status: Abnormal   Collection Time: 03/25/21  8:06 AM  Result Value Ref Range   Total Protein 7.2 6.0 - 8.5 g/dL   Albumin ELP 3.9 2.9 - 4.4 g/dL   Alpha 1 0.2 0.0 - 0.4 g/dL   Alpha 2 1.1 (H) 0.4 - 1.0 g/dL   Beta 1.1 0.7 - 1.3 g/dL   Gamma Globulin 0.9 0.4 - 1.8 g/dL   M-Spike, % Not Observed Not Observed g/dL   Globulin, Total 3.3 2.2 - 3.9 g/dL   A/G Ratio 1.2 0.7 - 1.7   Please Note: Comment     Comment: Protein electrophoresis scan will follow via computer, mail, or courier delivery.    Ig Kappa Free  Light Chain 32.0 (H) 3.3 - 19.4 mg/L   Ig Lambda Free Light Chain 17.7 5.7 - 26.3 mg/L   KAPPA/LAMBDA RATIO 1.81 (H) 0.26 - 1.65  Protein Electrophoresis, (serum)     Status: None   Collection Time: 03/25/21  8:06 AM  Result Value Ref Range   Interpretation: Comment     Comment: The SPE pattern demonstrates elevation of regions containing acute phase proteins suggesting an acute/subacute inflammatory response. Some conditions in which this pattern has been observed include: bacterial, viral or parasitic infection; mechanical, physical or chemical trauma; and cardiac failure. The gamma globulin region is unremarkable and evidence of monoclonal protein is not apparent.   PTH-Related Peptide     Status: None   Collection Time: 03/25/21  8:06 AM  Result Value Ref Range   PTH-related peptide CANCELED pmol/L    Comment: Test not performed due to the age of this specimen.  Result canceled by the ancillary.   Vitamin D 1,25 dihydroxy     Status: None   Collection Time: 03/25/21  8:06 AM  Result Value Ref Range   Vitamin D 1, 25 (OH)2 Total 33 pg/mL    Comment: Interpret results with caution. This sample was thawed in transit. We are unable to determine the length of time it was actually unfrozen. We recommend that you submit a new frozen specimen on this patient. Reference Range: Adults: 21 - 65 -  Comment entered. Prior report issued 03-Apr-2021 at 16:05 CT    Vitamin D2 1, 25 (OH)2 <10 pg/mL    Comment: Interpret results with caution. This sample was thawed in transit. We are unable to determine the length of time it was actually unfrozen. We recommend that you submit a new frozen specimen on this patient. This test was developed and its performance characteristics determined by [facility]. It has not been cleared or approved by the Food and Drug Administration. -  Refer to comments previously reported on 03-Apr-2021 at 16:05 CT    Vitamin D3 1, 25 (OH)2 29 pg/mL    Comment:  Interpret results with caution. This sample was thawed in transit. We are unable to determine the length of time it was actually unfrozen. We recommend that you submit a new frozen specimen  on this patient. This test was developed and its performance characteristics determined by [facility]. It has not been cleared or approved by the Food and Drug Administration. -  Refer to comments previously reported on 03-Apr-2021 at 16:05 CT   Hemoglobin A1c     Status: Abnormal   Collection Time: 03/25/21  8:06 AM  Result Value Ref Range   Hgb A1c MFr Bld 9.1 (H) 4.8 - 5.6 %    Comment:          Prediabetes: 5.7 - 6.4          Diabetes: >6.4          Glycemic control for adults with diabetes: <7.0    Est. average glucose Bld gHb Est-mCnc 214 mg/dL  Lipid panel     Status: Abnormal   Collection Time: 03/25/21  8:06 AM  Result Value Ref Range   Cholesterol, Total 216 (H) 100 - 199 mg/dL   Triglycerides 1,100 (HH) 0 - 149 mg/dL    Comment: Results confirmed on dilution.    HDL 20 (L) >39 mg/dL   VLDL Cholesterol Cal Comment (A) 5 - 40 mg/dL    Comment: The calculation for the VLDL cholesterol is not valid when triglyceride level is >800 mg/dL.    LDL Chol Calc (NIH) Comment (A) 0 - 99 mg/dL    Comment: Triglyceride result indicated is too high for an accurate LDL cholesterol estimation.    Chol/HDL Ratio 10.8 (H) 0.0 - 5.0 ratio    Comment:                                   T. Chol/HDL Ratio                                             Men  Women                               1/2 Avg.Risk  3.4    3.3                                   Avg.Risk  5.0    4.4                                2X Avg.Risk  9.6    7.1                                3X Avg.Risk 23.4   11.0   Magnesium     Status: None   Collection Time: 03/26/21  9:05 AM  Result Value Ref Range   Magnesium 1.7 1.6 - 2.3 mg/dL  Phosphorus     Status: None   Collection Time: 03/26/21  9:05 AM  Result Value Ref Range   Phosphorus  3.1 2.8 - 4.1 mg/dL  PTH-Related Peptide     Status: None   Collection Time: 03/26/21  9:05 AM  Result Value Ref Range   PTH-related peptide <2.0 pmol/L    Comment: This test was developed and its performance characteristics determined  by [facility]. It has not been cleared or approved by the Food and Drug Administration. Reference Range: All Ages: <2.0 The PTHrP assay should not be used to exclude cancer or screen tumor patients for humoral hypercalcemia of malignancy (HHM). The results should always be assessed in conjunction with the patient's medical history, clinical examination, and other findings. If test results are clinically discordant, please contact the laboratory.   PTH, intact and calcium     Status: Abnormal   Collection Time: 03/26/21  9:05 AM  Result Value Ref Range   Calcium 10.4 (H) 8.7 - 10.2 mg/dL   PTH 14 (L) 15 - 65 pg/mL   PTH Interp Comment     Comment: Interpretation                 Intact PTH    Calcium                                 (pg/mL)      (mg/dL) Normal                          15 - 65     8.6 - 10.2 Primary Hyperparathyroidism         >65          >10.2 Secondary Hyperparathyroidism       >65          <10.2 Non-Parathyroid Hypercalcemia       <65          >10.2 Hypoparathyroidism                  <15          < 8.6 Non-Parathyroid Hypocalcemia    15 - 65          < 8.6   Calcium, ionized     Status: None   Collection Time: 03/26/21  9:05 AM  Result Value Ref Range   Calcium, Ion 5.3 4.5 - 5.6 mg/dL  Calcium, urine, 24 hour     Status: None   Collection Time: 03/31/21  3:54 PM  Result Value Ref Range   Calcium, Urine 9.3 Not Estab. mg/dL   Calcium, 24H Urine 158 0 - 320 mg/24 hr  Creatinine, urine, 24 hour     Status: None   Collection Time: 03/31/21  3:54 PM  Result Value Ref Range   Creatinine, Urine 92.4 Not Estab. mg/dL   Creatinine, 24H Ur 1,571 1,000 - 2,000 mg/24 hr    RADIOGRAPHIC STUDIES: I have personally reviewed the  radiological images as listed and agreed with the findings in the report. No results found.  ASSESSMENT:  Non-parathyroid hypercalcemia: - Patient seen at the request of Dr. Wolfgang Phoenix for hypercalcemia. - Calcium mildly elevated since 06/29/2020, as high as 10.9 on 02/15/2021 - Albumin elevated since 10/08/2019 ranging between 5.0-5.2. - Excellent work-up by Dr. Wolfgang Phoenix showed PTH low at 11, PTH RP less than 2, 25-hydroxy vitamin D and 125 hydroxy vitamin D. - 24-hour urine calcium was normal. - SPEP was negative for M spike.  Kappa light chains elevated at 32, lambda light chain 17.7 and ratio of 1.81. - Patient has CKD with creatinine around 1.4-1.6. - He reports taking multivitamin with calcium daily for years.  Also takes Tums once per week.  He is on Protonix for GERD. - He reports night sweats every other day for  the last 6 months, drenching type.  No fevers or weight loss. - Denies taking any high-protein supplements.   Social/family history: - He lives at home with his wife.  He works as a IT trainer.  Quit smoking 35 years ago. - No family history of myeloma.  Father died of lung cancer.   PLAN:  Non-parathyroid hypercalcemia: - Elevated free kappa light chains and a free light chain ratio most likely from CKD. - We will check serum immunofixation to complete myeloma work-up. - He denies taking any high-protein supplements.  However his albumin has been elevated around 5. - His corrected calcium for albumin of 4 is normal.  Ionized calcium was also normal at 5.3 on 03/26/2021. - We will also check for TSH, angiotensin-converting enzyme and PSA levels to complete work-up. - Most likely benign mild hypercalcemia secondary to combination of MVT with calcium/Tums intake and slightly elevated albumin. - Will consider temporarily discontinuing multivitamin and calcium supplements. - RTC 1 to 2 weeks to discuss.  2.  Night sweats: - Reports night sweats every other day  for past 6 months.  No other B symptoms. - No palpable adenopathy or splenomegaly. - Will check testosterone levels, LDH.   All questions were answered. The patient knows to call the clinic with any problems, questions or concerns.  Derek Jack, MD 04/30/21 8:37 AM  [place] [phone]   I, Thana Ates, am acting as a scribe for Dr. Derek Jack.  I, Derek Jack MD, have reviewed the above documentation for accuracy and completeness, and I agree with the above.

## 2021-04-30 NOTE — Patient Instructions (Addendum)
[place] at [facility] Discharge Instructions  You were seen and examined today by Dr. Delton Coombes. Dr. Delton Coombes is a hematologist, meaning that he specializes in blood abnormalities. Dr. Delton Coombes discussed your past medical history, family history of cancers/blood conditions and the events that led to you being here today.   You were referred to Dr. Delton Coombes due to your recent abnormal lab work. Your calcium and albumin (protein) were elevated. Dr. Delton Coombes has recommended additional lab work today to rule out anything serious.  Follow-up as scheduled.   Thank you for choosing [place] at [facility] to provide your oncology and hematology care.  To afford each patient quality time with our provider, please arrive at least 15 minutes before your scheduled appointment time.   If you have a lab appointment with the [facility] please come in thru the Main Entrance and check in at the main information desk.  You need to re-schedule your appointment should you arrive 10 or more minutes late.  We strive to give you quality time with our providers, and arriving late affects you and other patients whose appointments are after yours.  Also, if you no show three or more times for appointments you may be dismissed from the clinic at the providers discretion.     Again, thank you for choosing [facility].  Our hope is that these requests will decrease the amount of time that you wait before being seen by our physicians.       _____________________________________________________________  Should you have questions after your visit to [facility], please contact our office at [phone] and follow the prompts.  Our office hours are 8:00 a.m. and 4:30 p.m. Monday - Friday.  Please note that voicemails left after 4:00 p.m. may not be returned until the following business day.  We are closed weekends and major holidays.  You  do have access to a nurse 24-7, just call the main number to the clinic [phone] and do not press any options, hold on the line and a nurse will answer the phone.    For prescription refill requests, have your pharmacy contact our office and allow 72 hours.    Due to Covid, you will need to wear a mask upon entering the hospital. If you do not have a mask, a mask will be given to you at the Main Entrance upon arrival. For doctor visits, patients may have 1 support person age 52 or older with them. For treatment visits, patients can not have anyone with them due to social distancing guidelines and our immunocompromised population.

## 2021-05-01 LAB — ANGIOTENSIN CONVERTING ENZYME: Angiotensin-Converting Enzyme: 21 U/L (ref 14–82)

## 2021-05-01 LAB — TESTOSTERONE: Testosterone: 77 ng/dL — ABNORMAL LOW (ref 264–916)

## 2021-05-05 ENCOUNTER — Other Ambulatory Visit: Payer: Self-pay | Admitting: Nurse Practitioner

## 2021-05-05 DIAGNOSIS — E782 Mixed hyperlipidemia: Secondary | ICD-10-CM

## 2021-05-06 NOTE — Progress Notes (Signed)
[name], [name] [address]   CLINIC:  Medical Oncology/Hematology  PCP:  Kathyrn Drown, MD [address] [place] [address] [phone]   REASON FOR VISIT:  Follow-up for known parathyroid hypercalcemia  PRIOR THERAPY: None  CURRENT THERAPY: Under work-up  INTERVAL HISTORY:  Terry [name] 61 y.o. male returns for routine follow-up of his hypercalcemia.  He was seen for initial consultation by Dr. Delton Coombes on 04/30/2021.  At today's visit, he reports feeling fairly well.  He has not had any changes or new complaints since his visit last week.  He reports drenching night sweats every other day for the past 6 months.  He denies any other B symptoms such as fever or unintentional weight loss.  He has chronic arthritic pain in his hips, knees, and ankles, but denies any new bony pains.  He has not noticed any new lumps or bumps.  He has 60% energy and 100% appetite. He endorses that he is maintaining a stable weight.   REVIEW OF SYSTEMS:  Review of Systems  Constitutional:  Positive for fatigue. Negative for appetite change, chills, diaphoresis, fever and unexpected weight change.  HENT:   Negative for lump/mass and nosebleeds.   Eyes:  Negative for eye problems.  Respiratory:  Positive for shortness of breath (With exertion). Negative for cough and hemoptysis.   Cardiovascular:  Negative for chest pain, leg swelling and palpitations.  Gastrointestinal:  Negative for abdominal pain, blood in stool, constipation, diarrhea, nausea and vomiting.  Genitourinary:  Negative for hematuria.   Skin: Negative.   Neurological:  Positive for numbness. Negative for dizziness, headaches and light-headedness.  Hematological:  Does not bruise/bleed easily.  Psychiatric/Behavioral:  Positive for sleep disturbance.      PAST MEDICAL/SURGICAL HISTORY:  Past Medical History:  Diagnosis Date   Diabetes mellitus without complication ([place]) 9629    Hyperlipidemia    Hypertension    Osteoarthritis    Past Surgical History:  Procedure Laterality Date   ANKLE SURGERY Left 03/28/2005   2 plates/screws   COLONOSCOPY N/A 08/04/2017   Procedure: COLONOSCOPY;  Surgeon: Rogene Houston, MD;  [facility];  Service: Endoscopy;  Laterality: N/A;   ESOPHAGEAL DILATION N/A 08/04/2017   Procedure: ESOPHAGEAL DILATION;  Surgeon: Rogene Houston, MD;  [facility];  Service: Endoscopy;  Laterality: N/A;   ESOPHAGOGASTRODUODENOSCOPY N/A 08/04/2017   Procedure: ESOPHAGOGASTRODUODENOSCOPY (EGD);  Surgeon: Rogene Houston, MD;  [facility];  Service: Endoscopy;  Laterality: N/A;   POLYPECTOMY  08/04/2017   Procedure: POLYPECTOMY;  Surgeon: Rogene Houston, MD;  [facility];  Service: Endoscopy;;   ROOT CANAL       SOCIAL HISTORY:  Social History   Socioeconomic History   Marital status: Married    Spouse name: Not on file   Number of children: Not on file   Years of education: Not on file   Highest education level: Not on file  Occupational History   Not on file  Tobacco Use   Smoking status: Former    Types: Cigarettes    Quit date: 06/15/1992    Years since quitting: 28.9   Smokeless tobacco: Never  Vaping Use   Vaping Use: Never used  Substance and Sexual Activity   Alcohol use: Never   Drug use: Not Currently    Types: Marijuana    Comment: last used 1970   Sexual activity: Not on file  Other Topics Concern   Not on file  Social History Narrative   Not on file   Social Determinants of Health   Financial Resource Strain: Not on file  Food Insecurity: Not on file  Transportation Needs: Not on file  Physical Activity: Not on file  Stress: Not on file  Social Connections: Not on file  Intimate Partner Violence: Not on file    FAMILY HISTORY:  Family History  Problem Relation Age of Onset   Hypertension Mother    Diabetes Mother    Heart disease Mother     Hyperlipidemia Mother    Cancer Father        lung    CURRENT MEDICATIONS:  Outpatient Encounter Medications as of 05/07/2021  Medication Sig Note   amLODipine (NORVASC) 10 MG tablet Take 1 tablet (10 mg total) by mouth daily.    ascorbic acid (VITAMIN C) 500 MG tablet Take 1 tablet (500 mg total) by mouth daily.    aspirin EC 81 MG tablet Take 1 tablet (81 mg total) by mouth daily with breakfast.    Continuous Blood Gluc Sensor (DEXCOM G6 SENSOR) MISC APPLY 1 SENSOR EVERY 10 DAYS FOR GLUCOSE MONITORING.    Continuous Blood Gluc Transmit (DEXCOM G6 TRANSMITTER) MISC USE AS DIRECTED.    Dulaglutide (TRULICITY) 1.5 UY/4.0HK SOPN Inject 1.5 mg into the skin once a week.    fenofibrate 160 MG tablet TAKE 1 TABLET BY MOUTH ONCE A DAY.    glipiZIDE (GLUCOTROL XL) 5 MG 24 hr tablet Take 1 tablet (5 mg total) by mouth daily with breakfast.    indapamide (LOZOL) 2.5 MG tablet Take 1 tablet (2.5 mg total) by mouth daily.    insulin regular human CONCENTRATED (HUMULIN R U-500 KWIKPEN) 500 UNIT/ML KwikPen Inject 70-80 Units into the skin 3 (three) times daily with meals.    metoprolol succinate (TOPROL-XL) 100 MG 24 hr tablet TAKE (1) TABLET BY MOUTH ONCE DAILY.    mometasone (ELOCON) 0.1 % cream APPLY TO AFFECTED AREAS TWICE DAILY AS NEEDED.    Multiple Vitamin (MULTIVITAMIN) tablet Take 1 tablet by mouth daily.    oxyCODONE-acetaminophen (PERCOCET) 10-325 MG tablet Take one tablet po 4 times daily prn pain    oxyCODONE-acetaminophen (PERCOCET) 10-325 MG tablet Take one tablet po 4 times daily prn pain    oxyCODONE-acetaminophen (PERCOCET) 10-325 MG tablet Take one tablet po 4 times daily prn pain    rosuvastatin (CRESTOR) 40 MG tablet Take 1 tablet (40 mg total) by mouth daily.    sildenafil (REVATIO) 20 MG tablet Take 1-5 tablets before sexual activity as directed. No greater that 5 tablets in a day.    TECHLITE PEN NEEDLES 32G X 4 MM MISC INJECT 4 TIMES DAILY AS DIRECTED.    valsartan (DIOVAN) 160  MG tablet Take 1 tablet (160 mg total) by mouth daily.    zinc sulfate 220 (50 Zn) MG capsule Take 1 capsule (220 mg total) by mouth daily. 04/18/2019: Zinc gluconate 50mg  one daily Multi vit Vitamin c 500mg  one daily Florastor bid   No facility-administered encounter medications on file as of 05/07/2021.    ALLERGIES:  Allergies  Allergen Reactions   Invokana [Canagliflozin] Other (See Comments)    laryngitis   Altace [Ramipril] Cough   Metformin And Related Diarrhea     PHYSICAL EXAM:  ECOG PERFORMANCE STATUS: 1 - Symptomatic but completely ambulatory  There were no vitals filed for this visit. There were no vitals filed for this visit. Physical  Exam Constitutional:      Appearance: Normal appearance. He is obese.  HENT:     Head: Normocephalic and atraumatic.     Mouth/Throat:     Mouth: Mucous membranes are moist.  Eyes:     Extraocular Movements: Extraocular movements intact.     Pupils: Pupils are equal, round, and reactive to light.  Cardiovascular:     Rate and Rhythm: Normal rate and regular rhythm.     Pulses: Normal pulses.     Heart sounds: Murmur heard.  Pulmonary:     Effort: Pulmonary effort is normal.     Breath sounds: Normal breath sounds.  Abdominal:     General: Bowel sounds are normal.     Palpations: Abdomen is soft.     Tenderness: There is no abdominal tenderness.  Musculoskeletal:        General: No swelling.     Right lower leg: No edema.     Left lower leg: No edema.  Lymphadenopathy:     Cervical: No cervical adenopathy.  Skin:    General: Skin is warm and dry.  Neurological:     General: No focal deficit present.     Mental Status: He is alert and oriented to person, place, and time.  Psychiatric:        Mood and Affect: Mood normal.        Behavior: Behavior normal.     LABORATORY DATA:  I have reviewed the labs as listed.  CBC    Component Value Date/Time   WBC 7.0 11/22/2019 1507   WBC 10.1 04/10/2019 0430   RBC 3.93 (L)  11/22/2019 1507   RBC 4.25 04/10/2019 0430   HGB 12.0 (L) 11/22/2019 1507   HCT 34.9 (L) 11/22/2019 1507   PLT 130 (L) 11/22/2019 1507   MCV 89 11/22/2019 1507   MCH 30.5 11/22/2019 1507   MCH 30.8 04/10/2019 0430   MCHC 34.4 11/22/2019 1507   MCHC 35.6 04/10/2019 0430   RDW 12.1 11/22/2019 1507   LYMPHSABS 2.8 11/22/2019 1507   EOSABS 0.2 11/22/2019 1507   BASOSABS 0.1 11/22/2019 1507   CMP Latest Ref Rng & Units 03/26/2021 03/25/2021 02/15/2021  Glucose 70 - 99 mg/dL - - 212(H)  BUN 6 - 24 mg/dL - - 30(H)  Creatinine 0.76 - 1.27 mg/dL - - 1.46(H)  Sodium 134 - 144 mmol/L - - 141  Potassium 3.5 - 5.2 mmol/L - - 4.6  Chloride 96 - 106 mmol/L - - 99  CO2 20 - 29 mmol/L - - 26  Calcium 8.7 - 10.2 mg/dL 10.4(H) - 10.9(H)  Total Protein 6.0 - 8.5 g/dL - 7.2 -  Total Bilirubin 0.0 - 1.2 mg/dL - - -  Alkaline Phos 44 - 121 IU/L - - -  AST 0 - 40 IU/L - - -  ALT 0 - 44 IU/L - - -    DIAGNOSTIC IMAGING:  I have independently reviewed the relevant imaging and discussed with the patient.  ASSESSMENT & PLAN: 1.  Non-parathyroid hypercalcemia: - Patient seen at the request of Dr. Wolfgang Phoenix for hypercalcemia. - Calcium mildly elevated since 06/29/2020, as high as 10.9 on 02/15/2021 - Albumin elevated since 10/08/2019 ranging between 5.0-5.2.  He denies taking any high-protein supplements. - Excellent work-up by Dr. Wolfgang Phoenix showed PTH low at 11, PTH RP less than 2, 25-hydroxy vitamin D and 125 hydroxy vitamin D. - 24-hour urine calcium was normal. - He reports taking multivitamin with calcium daily for years.  Also takes Tums once per week.  He is on Protonix for GERD. - SPEP was negative for M spike.  Kappa light chains elevated at 32, lambda light chain 17.7 and ratio of 1.81, which is most likely from CKD (CKD with creatinine 1.4-1.6). - Serum immunofixation is pending - Additional work-up revealed normal TSH 0.713, normal PSA 0.14, and normal angiotensin-converting enzyme 21 - His  corrected calcium for albumin of 4 is normal.  Ionized calcium was also normal at 5.3 on 03/26/2021. - He reports night sweats but denies other B symptoms.  No new bone pain.   - Differential diagnosis favors benign mild hypercalcemia secondary to combination of multivitamin with calcium/Tums intake and slightly elevated albumin. - PLAN: Recommend temporarily discontinuing multivitamin and calcium supplements. - We will repeat labs and see patient for follow-up in 3 months.  2.  Night sweats: - Reports night sweats every other day for past 6 months.  No other B symptoms such as fevers or unintentional weight loss. - No palpable adenopathy or splenomegaly. - Normal LDH 182. - Low testosterone noted at 77 (04/30/2021) - PLAN: Suspect night sweats due to low testosterone.  Recommended patient to discuss with PCP regarding further work-up and treatment.  3.  Social/family history: - He lives at home with his wife.  He works as a IT trainer.  Quit smoking 35 years ago. - No family history of myeloma.  Father died of lung cancer.   PLAN SUMMARY & DISPOSITION: Immunofixation is pending-we will follow and call patients with results Otherwise, repeat labs and follow-up in 3 months  All questions were answered. The patient knows to call the clinic with any problems, questions or concerns.  Medical decision making: Low  Time spent on visit: I spent 15 minutes counseling the patient face to face. The total time spent in the appointment was 25 minutes and more than 50% was on counseling.   Harriett Rush, PA-C  05/07/21 11:26 AM

## 2021-05-07 ENCOUNTER — Inpatient Hospital Stay (HOSPITAL_BASED_OUTPATIENT_CLINIC_OR_DEPARTMENT_OTHER): Payer: BC Managed Care – PPO | Admitting: Physician Assistant

## 2021-05-07 ENCOUNTER — Other Ambulatory Visit: Payer: Self-pay

## 2021-05-07 DIAGNOSIS — I129 Hypertensive chronic kidney disease with stage 1 through stage 4 chronic kidney disease, or unspecified chronic kidney disease: Secondary | ICD-10-CM | POA: Diagnosis not present

## 2021-05-07 DIAGNOSIS — R61 Generalized hyperhidrosis: Secondary | ICD-10-CM | POA: Diagnosis not present

## 2021-05-07 DIAGNOSIS — E1122 Type 2 diabetes mellitus with diabetic chronic kidney disease: Secondary | ICD-10-CM | POA: Diagnosis not present

## 2021-05-07 DIAGNOSIS — Z801 Family history of malignant neoplasm of trachea, bronchus and lung: Secondary | ICD-10-CM | POA: Diagnosis not present

## 2021-05-07 DIAGNOSIS — Z87891 Personal history of nicotine dependence: Secondary | ICD-10-CM | POA: Diagnosis not present

## 2021-05-07 DIAGNOSIS — N189 Chronic kidney disease, unspecified: Secondary | ICD-10-CM | POA: Diagnosis not present

## 2021-05-07 DIAGNOSIS — Z79899 Other long term (current) drug therapy: Secondary | ICD-10-CM | POA: Diagnosis not present

## 2021-05-07 LAB — IMMUNOFIXATION ELECTROPHORESIS
IgA: 172 mg/dL (ref 90–386)
IgG (Immunoglobin G), Serum: 834 mg/dL (ref 603–1613)
IgM (Immunoglobulin M), Srm: 32 mg/dL (ref 20–172)
Total Protein ELP: 7.1 g/dL (ref 6.0–8.5)

## 2021-05-07 NOTE — Patient Instructions (Signed)
[place] at [facility] Discharge Instructions  You were seen today by Tarri Abernethy PA-C for your high calcium.  None of the other tests we checked were abnormal, so we suspect your elevated calcium is from your multivitamin.  Please stop your multivitamin for the time being.  We will check your labs and see you back in 3 months.    LABS: Return in 3 months for repeat labs   MEDICATIONS: STOP multivitamin, Tums, and calcium supplements  FOLLOW-UP APPOINTMENT: Office visit in 3 months   Thank you for choosing [place] at [facility] to provide your oncology and hematology care.  To afford each patient quality time with our provider, please arrive at least 15 minutes before your scheduled appointment time.   If you have a lab appointment with the [facility] please come in thru the Main Entrance and check in at the main information desk.  You need to re-schedule your appointment should you arrive 10 or more minutes late.  We strive to give you quality time with our providers, and arriving late affects you and other patients whose appointments are after yours.  Also, if you no show three or more times for appointments you may be dismissed from the clinic at the providers discretion.     Again, thank you for choosing [facility].  Our hope is that these requests will decrease the amount of time that you wait before being seen by our physicians.       _____________________________________________________________  Should you have questions after your visit to [facility], please contact our office at [phone] and follow the prompts.  Our office hours are 8:00 a.m. and 4:30 p.m. Monday - Friday.  Please note that voicemails left after 4:00 p.m. may not be returned until the following business day.  We are closed weekends and major holidays.  You do have access to a nurse 24-7, just call the main number to the  clinic [phone] and do not press any options, hold on the line and a nurse will answer the phone.    For prescription refill requests, have your pharmacy contact our office and allow 72 hours.    Due to Covid, you will need to wear a mask upon entering the hospital. If you do not have a mask, a mask will be given to you at the Main Entrance upon arrival. For doctor visits, patients may have 1 support person age 93 or older with them. For treatment visits, patients can not have anyone with them due to social distancing guidelines and our immunocompromised population.

## 2021-05-12 ENCOUNTER — Encounter (HOSPITAL_COMMUNITY): Payer: Self-pay | Admitting: *Deleted

## 2021-05-14 ENCOUNTER — Telehealth: Payer: Self-pay | Admitting: *Deleted

## 2021-05-14 NOTE — Telephone Encounter (Signed)
I have made multiple attempts via telephone and [facility] to contact this patient regarding his recent lab results and have not been able to reach by either method.  Results were normal and he will follow up in clinic as planned.

## 2021-06-09 ENCOUNTER — Other Ambulatory Visit: Payer: Self-pay | Admitting: Family Medicine

## 2021-06-11 ENCOUNTER — Ambulatory Visit: Payer: BC Managed Care – PPO | Admitting: Family Medicine

## 2021-06-21 ENCOUNTER — Other Ambulatory Visit: Payer: Self-pay | Admitting: Family Medicine

## 2021-06-21 DIAGNOSIS — M7732 Calcaneal spur, left foot: Secondary | ICD-10-CM | POA: Diagnosis not present

## 2021-06-21 DIAGNOSIS — M79672 Pain in left foot: Secondary | ICD-10-CM | POA: Diagnosis not present

## 2021-06-21 DIAGNOSIS — M722 Plantar fascial fibromatosis: Secondary | ICD-10-CM | POA: Diagnosis not present

## 2021-07-08 NOTE — Patient Instructions (Signed)

## 2021-07-09 ENCOUNTER — Encounter: Payer: Self-pay | Admitting: Nurse Practitioner

## 2021-07-09 ENCOUNTER — Ambulatory Visit: Payer: BC Managed Care – PPO | Admitting: Nurse Practitioner

## 2021-07-09 VITALS — BP 135/70 | HR 77 | Ht 70.0 in | Wt 203.4 lb

## 2021-07-09 DIAGNOSIS — I10 Essential (primary) hypertension: Secondary | ICD-10-CM | POA: Diagnosis not present

## 2021-07-09 DIAGNOSIS — E119 Type 2 diabetes mellitus without complications: Secondary | ICD-10-CM | POA: Diagnosis not present

## 2021-07-09 DIAGNOSIS — E782 Mixed hyperlipidemia: Secondary | ICD-10-CM | POA: Diagnosis not present

## 2021-07-09 LAB — POCT GLYCOSYLATED HEMOGLOBIN (HGB A1C): HbA1c, POC (controlled diabetic range): 8.6 % — AB (ref 0.0–7.0)

## 2021-07-09 LAB — POCT UA - MICROALBUMIN
Creatinine, POC: 200 mg/dL
Microalbumin Ur, POC: 150 mg/L

## 2021-07-09 MED ORDER — HUMULIN R U-500 KWIKPEN 500 UNIT/ML ~~LOC~~ SOPN: 70.0000 [IU] | PEN_INJECTOR | Freq: Three times a day (TID) | SUBCUTANEOUS | 3 refills | Status: DC

## 2021-07-09 NOTE — Progress Notes (Signed)
? ?                                                   Endocrinology Follow Up Visit ?     07/09/2021, 9:14 AM ? ? ?Subjective:  ? ? Patient ID: Terry [name], male    DOB: [birth year].  ?Terry [name] is being seen in follow up after being seen in consultation for management of currently uncontrolled symptomatic diabetes requested by  Kathyrn Drown, MD. ? ? ?Past Medical History:  ?Diagnosis Date  ? Diabetes mellitus without complication ([place]) 2542  ? Hyperlipidemia   ? Hypertension   ? Osteoarthritis   ? ? ?Past Surgical History:  ?Procedure Laterality Date  ? ANKLE SURGERY Left 03/28/2005  ? 2 plates/screws  ? COLONOSCOPY N/A 08/04/2017  ? Procedure: COLONOSCOPY;  Surgeon: Rogene Houston, MD;  [facility];  Service: Endoscopy;  Laterality: N/A;  ? ESOPHAGEAL DILATION N/A 08/04/2017  ? Procedure: ESOPHAGEAL DILATION;  Surgeon: Rogene Houston, MD;  [facility];  Service: Endoscopy;  Laterality: N/A;  ? ESOPHAGOGASTRODUODENOSCOPY N/A 08/04/2017  ? Procedure: ESOPHAGOGASTRODUODENOSCOPY (EGD);  Surgeon: Rogene Houston, MD;  [facility];  Service: Endoscopy;  Laterality: N/A;  ? POLYPECTOMY  08/04/2017  ? Procedure: POLYPECTOMY;  Surgeon: Rogene Houston, MD;  [facility];  Service: Endoscopy;;  ? ROOT CANAL    ? ? ?Social History  ? ?Socioeconomic History  ? Marital status: Married  ?  Spouse name: Not on file  ? Number of children: Not on file  ? Years of education: Not on file  ? Highest education level: Not on file  ?Occupational History  ? Not on file  ?Tobacco Use  ? Smoking status: Former  ?  Types: Cigarettes  ?  Quit date: 06/15/1992  ?  Years since quitting: 29.0  ? Smokeless tobacco: Never  ?Vaping Use  ? Vaping Use: Never used  ?Substance and Sexual Activity  ? Alcohol use: Never  ? Drug use: Not Currently  ?  Types: Marijuana  ?  Comment: last used 1970  ? Sexual activity: Not on file  ?Other Topics Concern  ? Not on file  ?Social  History Narrative  ? Not on file  ? ?Social Determinants of Health  ? ?Financial Resource Strain: Not on file  ?Food Insecurity: Not on file  ?Transportation Needs: Not on file  ?Physical Activity: Not on file  ?Stress: Not on file  ?Social Connections: Not on file  ? ? ?Family History  ?Problem Relation Age of Onset  ? Hypertension Mother   ? Diabetes Mother   ? Heart disease Mother   ? Hyperlipidemia Mother   ? Cancer Father   ?     lung  ? ? ?Outpatient Encounter Medications as of 07/09/2021  ?Medication Sig  ? ascorbic acid (VITAMIN C) 500 MG tablet Take 1 tablet (500 mg total) by mouth daily.  ? aspirin EC 81 MG tablet Take 1 tablet (81 mg total) by mouth daily with breakfast.  ? Continuous Blood Gluc Sensor (DEXCOM G6 SENSOR) MISC APPLY 1 SENSOR EVERY 10 DAYS FOR GLUCOSE MONITORING.  ? Continuous Blood Gluc Transmit (DEXCOM G6 TRANSMITTER) MISC USE AS DIRECTED.  ? Dulaglutide (TRULICITY) 1.5 HC/6.2BJ SOPN Inject 1.5 mg into the skin once a week.  ?  fenofibrate 160 MG tablet TAKE 1 TABLET BY MOUTH ONCE A DAY.  ? glipiZIDE (GLUCOTROL XL) 5 MG 24 hr tablet Take 1 tablet (5 mg total) by mouth daily with breakfast.  ? indapamide (LOZOL) 2.5 MG tablet Take 1 tablet (2.5 mg total) by mouth daily.  ? metoprolol succinate (TOPROL-XL) 100 MG 24 hr tablet TAKE (1) TABLET BY MOUTH ONCE DAILY.  ? mometasone (ELOCON) 0.1 % cream APPLY TO AFFECTED AREAS TWICE DAILY AS NEEDED.  ? oxyCODONE-acetaminophen (PERCOCET) 10-325 MG tablet Take one tablet po 4 times daily prn pain  ? oxyCODONE-acetaminophen (PERCOCET) 10-325 MG tablet Take one tablet po 4 times daily prn pain  ? oxyCODONE-acetaminophen (PERCOCET) 10-325 MG tablet TAKE ONE TABLET BY MOUTH (4) TIMES DAILY AS NEEDED FOR PAIN.  ? pantoprazole (PROTONIX) 20 MG tablet Take 20 mg by mouth daily.  ? rosuvastatin (CRESTOR) 40 MG tablet Take 1 tablet (40 mg total) by mouth daily.  ? sildenafil (REVATIO) 20 MG tablet Take 1-5 tablets before sexual activity as directed. No greater  that 5 tablets in a day.  ? TECHLITE PEN NEEDLES 32G X 4 MM MISC INJECT 4 TIMES DAILY AS DIRECTED.  ? valsartan (DIOVAN) 160 MG tablet TAKE 1 TABLET BY MOUTH ONCE A DAY.  ? [DISCONTINUED] insulin regular human CONCENTRATED (HUMULIN R U-500 KWIKPEN) 500 UNIT/ML KwikPen Inject 70-80 Units into the skin 3 (three) times daily with meals.  ? amLODipine (NORVASC) 10 MG tablet Take 1 tablet (10 mg total) by mouth daily.  ? insulin regular human CONCENTRATED (HUMULIN R U-500 KWIKPEN) 500 UNIT/ML KwikPen Inject 70-90 Units into the skin 3 (three) times daily with meals.  ? Multiple Vitamin (MULTIVITAMIN) tablet Take 1 tablet by mouth daily. (Patient not taking: Reported on 07/09/2021)  ? zinc sulfate 220 (50 Zn) MG capsule Take 1 capsule (220 mg total) by mouth daily. (Patient not taking: Reported on 07/09/2021)  ? ?No facility-administered encounter medications on file as of 07/09/2021.  ? ? ?ALLERGIES: ?Allergies  ?Allergen Reactions  ? Invokana [Canagliflozin] Other (See Comments)  ?  laryngitis  ? Altace [Ramipril] Cough  ? Metformin And Related Diarrhea  ? ? ?VACCINATION STATUS: ?Immunization History  ?Administered Date(s) Administered  ? Influenza,inj,Quad PF,6+ Mos 01/30/2014, 01/29/2016, 01/30/2017, 01/26/2018, 01/23/2019, 01/17/2020, 01/01/2021  ? Influenza-Unspecified 01/26/2012, 02/21/2015  ? Pneumococcal Polysaccharide-23 01/30/2014  ? Td 12/19/2014  ? ? ?Diabetes ?He presents for his follow-up diabetic visit. He has type 2 diabetes mellitus. Onset time: Diagnosed at approximate age of 48. His disease course has been fluctuating. There are no hypoglycemic associated symptoms. Pertinent negatives for hypoglycemia include no nervousness/anxiousness. Associated symptoms include fatigue. Pertinent negatives for diabetes include no blurred vision, no polydipsia, no polyphagia and no polyuria. There are no hypoglycemic complications. Nocturnal hypoglycemia: mild. Symptoms are stable. Diabetic complications include  nephropathy. Risk factors for coronary artery disease include diabetes mellitus, dyslipidemia, hypertension, male sex, obesity, stress and sedentary lifestyle. Current diabetic treatment includes intensive insulin program and oral agent (monotherapy) (and Trulicity). He is compliant with treatment most of the time (sometimes forgets to take his Trulicity). His weight is fluctuating minimally. He is following a generally unhealthy diet. When asked about meal planning, he reported none. He has not had a previous visit with a dietitian. He participates in exercise intermittently. His home blood glucose trend is fluctuating dramatically. His overall blood glucose range is >200 mg/dl. (He presents today with his CGM and logs showing above target glycemic profile overall.  His POCT A1c today is 8.6%,  improving from last visit of 9.1%.  He reports he sometimes forgets to take his Trulicity.  He reports he stays pretty stressed which may be holding him back.  Analysis of his CGM shows TIR 23%, TAR 77%, TBR 0% with a GMI of 9.6%.  He denies any significant hypoglycemia.) An ACE inhibitor/angiotensin II receptor blocker is being taken. He does not see a podiatrist.Eye exam is not current.  ?Hypertension ?This is a chronic problem. The current episode started more than 1 year ago. The problem has been gradually improving since onset. The problem is controlled. Pertinent negatives include no blurred vision. There are no associated agents to hypertension. Risk factors for coronary artery disease include diabetes mellitus, dyslipidemia, male gender, obesity and stress. Past treatments include angiotensin blockers and calcium channel blockers. The current treatment provides mild improvement. Compliance problems include diet and exercise.  Hypertensive end-organ damage includes kidney disease. Identifiable causes of hypertension include chronic renal disease.  ?Hyperlipidemia ?This is a chronic problem. The current episode started  more than 1 year ago. The problem is uncontrolled. Recent lipid tests were reviewed and are variable. Exacerbating diseases include chronic renal disease and diabetes. Factors aggravating his hyperlipidemia inc

## 2021-07-12 ENCOUNTER — Other Ambulatory Visit: Payer: Self-pay | Admitting: Family Medicine

## 2021-07-12 DIAGNOSIS — M7732 Calcaneal spur, left foot: Secondary | ICD-10-CM | POA: Diagnosis not present

## 2021-07-12 DIAGNOSIS — M79672 Pain in left foot: Secondary | ICD-10-CM | POA: Diagnosis not present

## 2021-07-12 DIAGNOSIS — M722 Plantar fascial fibromatosis: Secondary | ICD-10-CM | POA: Diagnosis not present

## 2021-07-16 ENCOUNTER — Ambulatory Visit (INDEPENDENT_AMBULATORY_CARE_PROVIDER_SITE_OTHER): Payer: BC Managed Care – PPO | Admitting: Family Medicine

## 2021-07-16 VITALS — BP 134/80 | HR 72 | Temp 98.4°F | Ht 70.0 in | Wt 206.4 lb

## 2021-07-16 DIAGNOSIS — E785 Hyperlipidemia, unspecified: Secondary | ICD-10-CM | POA: Diagnosis not present

## 2021-07-16 DIAGNOSIS — E1169 Type 2 diabetes mellitus with other specified complication: Secondary | ICD-10-CM

## 2021-07-16 DIAGNOSIS — Z79891 Long term (current) use of opiate analgesic: Secondary | ICD-10-CM | POA: Diagnosis not present

## 2021-07-16 MED ORDER — OXYCODONE-ACETAMINOPHEN 10-325 MG PO TABS: ORAL_TABLET | ORAL | 0 refills | Status: DC

## 2021-07-16 MED ORDER — ROSUVASTATIN CALCIUM 40 MG PO TABS: 40.0000 mg | ORAL_TABLET | Freq: Every day | ORAL | 3 refills | Status: DC

## 2021-07-16 MED ORDER — VALSARTAN 160 MG PO TABS: 160.0000 mg | ORAL_TABLET | Freq: Every day | ORAL | 3 refills | Status: DC

## 2021-07-16 MED ORDER — METOPROLOL SUCCINATE ER 100 MG PO TB24: ORAL_TABLET | ORAL | 3 refills | Status: DC

## 2021-07-16 MED ORDER — INDAPAMIDE 2.5 MG PO TABS: ORAL_TABLET | ORAL | 3 refills | Status: DC

## 2021-07-16 NOTE — Progress Notes (Signed)
? ?  Subjective:  ? ? Patient ID: Terry [name], male    DOB: 09/24/1960, 61 y.o.   MRN: [id number] ? ?HPI ? ?Patient here for follow up on pain management.  ?This patient was seen today for chronic pain ? ?The medication list was reviewed and updated. ? ?Location of Pain for which the patient has been treated with regarding narcotics: Chronic pain in both feet due to neuropathy as well as chronic arthritis in his hands, not well alleviated with anti-inflammatories ? ?Onset of this pain: Present for years ? ? -Compliance with medication: Compliant with medicine ? ?- Number patient states they take daily: Maximum of 4/day ? ?-when was the last dose patient took?  Earlier today ? ?The patient was advised the importance of maintaining medication and not using illegal substances with these. ? ?Here for refills and follow up ? ?The patient was educated that we can provide 3 monthly scripts for their medication, it is their responsibility to follow the instructions. ? ?Side effects or complications from medications: Denies side effects ? ?Patient is aware that pain medications are meant to minimize the severity of the pain to allow their pain levels to improve to allow for better function. They are aware of that pain medications cannot totally remove their pain. ? ?Due for UDT ( at least once per year) : On next visit this summer ? ?Scale of 1 to 10 ( 1 is least 10 is most) ?Your pain level without the medicine: 7 ?Your pain level with medication 5 ? ?Scale 1 to 10 ( 1-helps very little, 10 helps very well) ?How well does your pain medication reduce your pain so you can function better through out the day?  7 ? ?Quality of the pain: Burning aching pain ? ?Persistence of the pain: Present all the time ? ?Modifying factors: Worse with activity ? ? ?  ? ?Review of Systems ? ?   ?Objective:  ? Physical Exam ?General-in no acute distress ?Eyes-no discharge ?Lungs-respiratory rate normal, CTA ?CV-no murmurs,RRR ?Extremities skin  warm dry no edema ?Neuro grossly normal ?Behavior normal, alert ? ? ? ? ?   ?Assessment & Plan:  ?1. Encounter for long-term opiate analgesic use ?The patient was seen in followup for chronic pain. ?A review over at their current pain status was discussed. Drug registry was checked. ?Prescriptions were given.  Regular follow-up recommended. ?Discussion was held regarding the importance of compliance with medication as well as pain medication contract. ? ?Patient was informed that medication may cause drowsiness and should not be combined  with other medications/alcohol or street drugs. If the patient feels medication is causing altered alertness then do not drive or operate dangerous equipment. ? ?Should be noted that the patient appears to be meeting appropriate use of opioids and response.  Evidenced by improved function and decent pain control without significant side effects and no evidence of overt aberrancy issues.  Upon discussion with the patient today they understand that opioid therapy is optional and they feel that the pain has been refractory to reasonable conservative measures and is significant and affecting quality of life enough to warrant ongoing therapy and wishes to continue opioids.  Refills were provided. ? ?Follow-up in 3 months urine drug screen at that time ? ? ?2. Hyperlipidemia associated with type 2 diabetes mellitus ([place]) ?Continue cholesterol medicine check lipid profile follow-up as planned in 3 months ?- Lipid Profile ? ? ?

## 2021-07-16 NOTE — Patient Instructions (Signed)

## 2021-07-19 ENCOUNTER — Telehealth: Payer: Self-pay | Admitting: Cardiology

## 2021-07-19 NOTE — Telephone Encounter (Signed)
Pt scheduled with B. Strader 11/05/2021. ?

## 2021-07-19 NOTE — Telephone Encounter (Signed)
No call about testing, pt is on recall list for Dr. Harl Bowie.  ?

## 2021-07-19 NOTE — Telephone Encounter (Signed)
Patient's wife is calling stating the patient received a call from [facility] about testing. Unable to find any orders or documentation advised her he is overdue for a follow up. She requested a message be sent for clarification.  ?

## 2021-07-22 ENCOUNTER — Other Ambulatory Visit: Payer: Self-pay | Admitting: Nurse Practitioner

## 2021-08-06 ENCOUNTER — Other Ambulatory Visit (HOSPITAL_COMMUNITY)
Admission: RE | Admit: 2021-08-06 | Discharge: 2021-08-06 | Disposition: A | Discharge status: Home / Self Care | Payer: BC Managed Care – PPO | Source: Ambulatory Visit | Attending: Family Medicine | Admitting: Family Medicine

## 2021-08-06 ENCOUNTER — Inpatient Hospital Stay (HOSPITAL_COMMUNITY): Payer: BC Managed Care – PPO | Attending: Hematology

## 2021-08-06 DIAGNOSIS — M25552 Pain in left hip: Secondary | ICD-10-CM | POA: Insufficient documentation

## 2021-08-06 DIAGNOSIS — R61 Generalized hyperhidrosis: Secondary | ICD-10-CM | POA: Insufficient documentation

## 2021-08-06 DIAGNOSIS — E119 Type 2 diabetes mellitus without complications: Secondary | ICD-10-CM | POA: Insufficient documentation

## 2021-08-06 DIAGNOSIS — I10 Essential (primary) hypertension: Secondary | ICD-10-CM | POA: Diagnosis not present

## 2021-08-06 DIAGNOSIS — M25551 Pain in right hip: Secondary | ICD-10-CM | POA: Diagnosis not present

## 2021-08-06 DIAGNOSIS — E1169 Type 2 diabetes mellitus with other specified complication: Secondary | ICD-10-CM | POA: Diagnosis not present

## 2021-08-06 DIAGNOSIS — Z87891 Personal history of nicotine dependence: Secondary | ICD-10-CM | POA: Insufficient documentation

## 2021-08-06 DIAGNOSIS — G8929 Other chronic pain: Secondary | ICD-10-CM | POA: Insufficient documentation

## 2021-08-06 DIAGNOSIS — Z79899 Other long term (current) drug therapy: Secondary | ICD-10-CM | POA: Diagnosis not present

## 2021-08-06 LAB — COMPREHENSIVE METABOLIC PANEL
ALT: 30 U/L (ref 0–44)
AST: 26 U/L (ref 15–41)
Albumin: 4.2 g/dL (ref 3.5–5.0)
Alkaline Phosphatase: 33 U/L — ABNORMAL LOW (ref 38–126)
Anion gap: 7 (ref 5–15)
BUN: 28 mg/dL — ABNORMAL HIGH (ref 6–20)
CO2: 25 mmol/L (ref 22–32)
Calcium: 9.3 mg/dL (ref 8.9–10.3)
Chloride: 106 mmol/L (ref 98–111)
Creatinine, Ser: 1.65 mg/dL — ABNORMAL HIGH (ref 0.61–1.24)
GFR, Estimated: 48 mL/min — ABNORMAL LOW (ref >60)
Glucose, Bld: 332 mg/dL — ABNORMAL HIGH (ref 70–99)
Potassium: 5.2 mmol/L — ABNORMAL HIGH (ref 3.5–5.1)
Sodium: 138 mmol/L (ref 135–145)
Total Bilirubin: 0.6 mg/dL (ref 0.3–1.2)
Total Protein: 7.2 g/dL (ref 6.5–8.1)

## 2021-08-06 LAB — LIPID PANEL
Cholesterol: 141 mg/dL (ref 0–200)
HDL: 25 mg/dL — ABNORMAL LOW (ref >40)
LDL Cholesterol: 64 mg/dL (ref 0–99)
Total CHOL/HDL Ratio: 5.6 RATIO
Triglycerides: 262 mg/dL — ABNORMAL HIGH (ref <150)
VLDL: 52 mg/dL — ABNORMAL HIGH (ref 0–40)

## 2021-08-07 LAB — CALCIUM, IONIZED: Calcium, Ionized, Serum: 5.1 mg/dL (ref 4.5–5.6)

## 2021-08-09 ENCOUNTER — Other Ambulatory Visit: Payer: Self-pay | Admitting: Nurse Practitioner

## 2021-08-09 DIAGNOSIS — E782 Mixed hyperlipidemia: Secondary | ICD-10-CM

## 2021-08-12 NOTE — Progress Notes (Signed)
Terry [name], [place] [id number]   CLINIC:  Medical Oncology/Hematology  PCP:  Kathyrn Drown, MD [address] [place] [address] [phone]   REASON FOR VISIT:  Follow-up for non-parathyroid hypercalcemia  PRIOR THERAPY: None  CURRENT THERAPY: Under work-up  INTERVAL HISTORY:  Terry [name] 61 y.o. male returns for routine follow-up of non-parathyroid hypercalcemia.  He was last seen by Tarri Abernethy PA-C on 05/07/2021.  At today's visit, he reports feeling fairly well.  No recent hospitalizations, surgeries, or changes in baseline health status.  He reports drenching night sweats about twice per week, which is slightly improved from before.  He denies any other B symptoms such as fever or unintentional weight loss.  He has chronic arthritic pain in his hips, knees, and ankles, but denies any new bony pains.  He has not noticed any new lumps or bumps.  Since his last visit, he has stopped his calcium-containing multivitamin and Tums.    He has 50% energy and 100% appetite. He endorses that he is maintaining a stable weight.   REVIEW OF SYSTEMS:  Review of Systems  Constitutional:  Positive for fatigue. Negative for appetite change, chills, diaphoresis, fever and unexpected weight change.  HENT:   Negative for lump/mass and nosebleeds.   Eyes:  Negative for eye problems.  Respiratory:  Positive for shortness of breath. Negative for cough and hemoptysis.   Cardiovascular:  Negative for chest pain, leg swelling and palpitations.  Gastrointestinal:  Positive for constipation. Negative for abdominal pain, blood in stool, diarrhea, nausea and vomiting.  Genitourinary:  Negative for hematuria.   Musculoskeletal:  Positive for arthralgias.  Skin: Negative.   Neurological:  Positive for dizziness and numbness. Negative for headaches and light-headedness.  Hematological:  Does not bruise/bleed easily.  Psychiatric/Behavioral:  The  patient is nervous/anxious.      PAST MEDICAL/SURGICAL HISTORY:  Past Medical History:  Diagnosis Date   Diabetes mellitus without complication ([place]) 9449   Hyperlipidemia    Hypertension    Osteoarthritis    Past Surgical History:  Procedure Laterality Date   ANKLE SURGERY Left 03/28/2005   2 plates/screws   COLONOSCOPY N/A 08/04/2017   Procedure: COLONOSCOPY;  Surgeon: Rogene Houston, MD;  [facility];  Service: Endoscopy;  Laterality: N/A;   ESOPHAGEAL DILATION N/A 08/04/2017   Procedure: ESOPHAGEAL DILATION;  Surgeon: Rogene Houston, MD;  [facility];  Service: Endoscopy;  Laterality: N/A;   ESOPHAGOGASTRODUODENOSCOPY N/A 08/04/2017   Procedure: ESOPHAGOGASTRODUODENOSCOPY (EGD);  Surgeon: Rogene Houston, MD;  [facility];  Service: Endoscopy;  Laterality: N/A;   POLYPECTOMY  08/04/2017   Procedure: POLYPECTOMY;  Surgeon: Rogene Houston, MD;  [facility];  Service: Endoscopy;;   ROOT CANAL       SOCIAL HISTORY:  Social History   Socioeconomic History   Marital status: Married    Spouse name: Not on file   Number of children: Not on file   Years of education: Not on file   Highest education level: Not on file  Occupational History   Not on file  Tobacco Use   Smoking status: Former    Types: Cigarettes    Quit date: 06/15/1992    Years since quitting: 29.1   Smokeless tobacco: Never  Vaping Use   Vaping Use: Never used  Substance and Sexual Activity   Alcohol use: Never   Drug use: Not Currently  Types: Marijuana    Comment: last used 1970   Sexual activity: Not on file  Other Topics Concern   Not on file  Social History Narrative   Not on file   Social Determinants of Health   Financial Resource Strain: Not on file  Food Insecurity: Not on file  Transportation Needs: Not on file  Physical Activity: Not on file  Stress: Not on file  Social Connections: Not on file  Intimate Partner Violence:  Not on file    FAMILY HISTORY:  Family History  Problem Relation Age of Onset   Hypertension Mother    Diabetes Mother    Heart disease Mother    Hyperlipidemia Mother    Cancer Father        lung    CURRENT MEDICATIONS:  Outpatient Encounter Medications as of 08/13/2021  Medication Sig Note   amLODipine (NORVASC) 10 MG tablet Take 1 tablet (10 mg total) by mouth daily.    ascorbic acid (VITAMIN C) 500 MG tablet Take 1 tablet (500 mg total) by mouth daily. (Patient not taking: Reported on 07/16/2021)    aspirin EC 81 MG tablet Take 1 tablet (81 mg total) by mouth daily with breakfast.    Continuous Blood Gluc Sensor (DEXCOM G6 SENSOR) MISC APPLY 1 SENSOR EVERY 10 DAYS FOR GLUCOSE MONITORING.    Continuous Blood Gluc Transmit (DEXCOM G6 TRANSMITTER) MISC USE AS DIRECTED.    Dulaglutide (TRULICITY) 1.5 OX/7.3ZH SOPN Inject 1.5 mg into the skin once a week.    fenofibrate 160 MG tablet TAKE 1 TABLET BY MOUTH ONCE A DAY.    glipiZIDE (GLUCOTROL XL) 5 MG 24 hr tablet Take 1 tablet (5 mg total) by mouth daily with breakfast.    indapamide (LOZOL) 2.5 MG tablet TAKE (1) TABLET BY MOUTH ONCE DAILY.    insulin regular human CONCENTRATED (HUMULIN R U-500 KWIKPEN) 500 UNIT/ML KwikPen Inject 70-90 Units into the skin 3 (three) times daily with meals.    metoprolol succinate (TOPROL-XL) 100 MG 24 hr tablet TAKE (1) TABLET BY MOUTH ONCE DAILY.    mometasone (ELOCON) 0.1 % cream APPLY TO AFFECTED AREAS TWICE DAILY AS NEEDED.    Multiple Vitamin (MULTIVITAMIN) tablet Take 1 tablet by mouth daily. (Patient not taking: Reported on 07/09/2021)    oxyCODONE-acetaminophen (PERCOCET) 10-325 MG tablet Take one tablet po 4 times daily prn pain    oxyCODONE-acetaminophen (PERCOCET) 10-325 MG tablet Take one tablet po 4 times daily prn pain    oxyCODONE-acetaminophen (PERCOCET) 10-325 MG tablet TAKE ONE TABLET BY MOUTH (4) TIMES DAILY AS NEEDED FOR PAIN.    pantoprazole (PROTONIX) 20 MG tablet Take 20 mg by  mouth daily.    rosuvastatin (CRESTOR) 40 MG tablet Take 1 tablet (40 mg total) by mouth daily.    sildenafil (REVATIO) 20 MG tablet Take 1-5 tablets before sexual activity as directed. No greater that 5 tablets in a day.    TECHLITE PEN NEEDLES 32G X 4 MM MISC INJECT 4 TIMES DAILY AS DIRECTED.    valsartan (DIOVAN) 160 MG tablet Take 1 tablet (160 mg total) by mouth daily.    zinc sulfate 220 (50 Zn) MG capsule Take 1 capsule (220 mg total) by mouth daily. (Patient not taking: Reported on 07/09/2021) 04/18/2019: Zinc gluconate '50mg'$  one daily Multi vit Vitamin c '500mg'$  one daily Florastor bid   No facility-administered encounter medications on file as of 08/13/2021.    ALLERGIES:  Allergies  Allergen Reactions   Invokana [Canagliflozin] Other (See  Comments)    laryngitis   Altace [Ramipril] Cough   Metformin And Related Diarrhea     PHYSICAL EXAM:  ECOG PERFORMANCE STATUS: 1 - Symptomatic but completely ambulatory  There were no vitals filed for this visit. There were no vitals filed for this visit. Physical Exam Constitutional:      Appearance: Normal appearance. He is obese.  HENT:     Head: Normocephalic and atraumatic.     Mouth/Throat:     Mouth: Mucous membranes are moist.  Eyes:     Extraocular Movements: Extraocular movements intact.     Pupils: Pupils are equal, round, and reactive to light.  Cardiovascular:     Rate and Rhythm: Normal rate and regular rhythm.     Pulses: Normal pulses.     Heart sounds: Murmur heard.  Pulmonary:     Effort: Pulmonary effort is normal.     Breath sounds: Normal breath sounds.  Abdominal:     General: Bowel sounds are normal.     Palpations: Abdomen is soft.     Tenderness: There is no abdominal tenderness.  Musculoskeletal:        General: No swelling.     Right lower leg: No edema.     Left lower leg: No edema.  Lymphadenopathy:     Cervical: No cervical adenopathy.  Skin:    General: Skin is warm and dry.  Neurological:      General: No focal deficit present.     Mental Status: He is alert and oriented to person, place, and time.  Psychiatric:        Mood and Affect: Mood normal.        Behavior: Behavior normal.     LABORATORY DATA:  I have reviewed the labs as listed.  CBC    Component Value Date/Time   WBC 7.0 11/22/2019 1507   WBC 10.1 04/10/2019 0430   RBC 3.93 (L) 11/22/2019 1507   RBC 4.25 04/10/2019 0430   HGB 12.0 (L) 11/22/2019 1507   HCT 34.9 (L) 11/22/2019 1507   PLT 130 (L) 11/22/2019 1507   MCV 89 11/22/2019 1507   MCH 30.5 11/22/2019 1507   MCH 30.8 04/10/2019 0430   MCHC 34.4 11/22/2019 1507   MCHC 35.6 04/10/2019 0430   RDW 12.1 11/22/2019 1507   LYMPHSABS 2.8 11/22/2019 1507   EOSABS 0.2 11/22/2019 1507   BASOSABS 0.1 11/22/2019 1507      Latest Ref Rng & Units 08/06/2021    8:05 AM 03/26/2021    9:05 AM 03/25/2021    8:06 AM  CMP  Glucose 70 - 99 mg/dL 332      BUN 6 - 20 mg/dL 28      Creatinine 0.61 - 1.24 mg/dL 1.65      Sodium 135 - 145 mmol/L 138      Potassium 3.5 - 5.1 mmol/L 5.2      Chloride 98 - 111 mmol/L 106      CO2 22 - 32 mmol/L 25      Calcium 8.9 - 10.3 mg/dL 9.3   10.4     Total Protein 6.5 - 8.1 g/dL 7.2    7.2    Total Bilirubin 0.3 - 1.2 mg/dL 0.6      Alkaline Phos 38 - 126 U/L 33      AST 15 - 41 U/L 26      ALT 0 - 44 U/L 30        DIAGNOSTIC IMAGING:  I have independently reviewed the relevant imaging and discussed with the patient.  ASSESSMENT & PLAN: 1.  Non-parathyroid hypercalcemia: - Patient seen at the request of Dr. Wolfgang Phoenix for hypercalcemia. - Calcium mildly elevated since 06/29/2020, as high as 10.9 on 02/15/2021 - Albumin elevated since 10/08/2019 ranging between 5.0-5.2.  He denies taking any high-protein supplements. - Excellent work-up by Dr. Wolfgang Phoenix showed PTH low at 11, PTH RP less than 2, 25-hydroxy vitamin D and 125 hydroxy vitamin D. - 24-hour urine calcium was normal. - He reports taking multivitamin with calcium daily  for years.  Was also using Tums once per week.  He is on Protonix for GERD.   - SPEP was negative for M spike.  Kappa light chains elevated at 32, lambda light chain 17.7 and ratio of 1.81, which is most likely from CKD (CKD with creatinine 1.4-1.6).  Serum immunofixation was normal. - Additional work-up revealed normal TSH 0.713, normal PSA 0.14, and normal angiotensin-converting enzyme 21 - His corrected calcium for albumin of 4 is normal.  Ionized calcium was also normal at 5.3 on 03/26/2021. - Since last visit, he has stopped taking multivitamin/calcium supplement and also stopped taking Tums. - Most recent labs (08/06/2021) showed normal calcium 9.3 per CMP, with normal ionized calcium 5.1.  Albumin has normalized at 4.2 - He reports night sweats but denies other B symptoms.  No new bone pain.     - Differential diagnosis favors benign mild hypercalcemia secondary to combination of multivitamin with calcium/Tums intake and slightly elevated albumin.  This has resolved. - PLAN: Hypercalcemia has resolved after patient stopped taking multivitamin and Tums.  No concern for malignant hypercalcemia at this time. - No further work-up is necessary.  We will discharge back to PCP, but patient can be referred back to [place] on an as-needed basis in the future.   2.  Night sweats:   - Reports night sweats a few times per week for past 6 months.  No other B symptoms such as fevers or unintentional weight loss.   - No palpable adenopathy or splenomegaly. - Normal LDH 182. - Low testosterone noted at 77 (04/30/2021) - PLAN: Suspect night sweats due to low testosterone.  Recommended patient to discuss with PCP regarding further work-up and treatment.     3.  Social/family history: - He lives at home with his wife.  He works as a IT trainer.  Quit smoking 35 years ago. - No family history of myeloma.  Father died of lung cancer.   All questions were answered. The patient knows to call the  clinic with any problems, questions or concerns.  Medical decision making: Low  Time spent on visit: I spent 15 minutes counseling the patient face to face. The total time spent in the appointment was 20 minutes and more than 50% was on counseling.   Harriett Rush, PA-C  08/13/2021 8:57 AM

## 2021-08-13 ENCOUNTER — Inpatient Hospital Stay (HOSPITAL_COMMUNITY): Payer: BC Managed Care – PPO | Admitting: Physician Assistant

## 2021-08-13 DIAGNOSIS — G8929 Other chronic pain: Secondary | ICD-10-CM | POA: Diagnosis not present

## 2021-08-13 DIAGNOSIS — Z79899 Other long term (current) drug therapy: Secondary | ICD-10-CM | POA: Diagnosis not present

## 2021-08-13 DIAGNOSIS — M25551 Pain in right hip: Secondary | ICD-10-CM | POA: Diagnosis not present

## 2021-08-13 DIAGNOSIS — I10 Essential (primary) hypertension: Secondary | ICD-10-CM | POA: Diagnosis not present

## 2021-08-13 DIAGNOSIS — Z87891 Personal history of nicotine dependence: Secondary | ICD-10-CM | POA: Diagnosis not present

## 2021-08-13 DIAGNOSIS — E119 Type 2 diabetes mellitus without complications: Secondary | ICD-10-CM | POA: Diagnosis not present

## 2021-08-13 DIAGNOSIS — R61 Generalized hyperhidrosis: Secondary | ICD-10-CM | POA: Diagnosis not present

## 2021-08-13 DIAGNOSIS — M25552 Pain in left hip: Secondary | ICD-10-CM | POA: Diagnosis not present

## 2021-08-13 NOTE — Patient Instructions (Signed)
[place] at [facility] Discharge Instructions  You were seen today by Tarri Abernethy PA-C for your elevated calcium levels.  Since your last visit, your calcium has returned to normal.  MEDICATIONS: Avoid calcium supplements or multivitamins containing calcium.  FOLLOW-UP APPOINTMENT: You do not need to follow-up at the cancer center anymore.  You should continue to follow-up with your primary care provider, and can be referred back to [place] as needed in the future.   Thank you for choosing [place] at [facility] to provide your oncology and hematology care.  To afford each patient quality time with our provider, please arrive at least 15 minutes before your scheduled appointment time.   If you have a lab appointment with the [facility] please come in thru the Main Entrance and check in at the main information desk.  You need to re-schedule your appointment should you arrive 10 or more minutes late.  We strive to give you quality time with our providers, and arriving late affects you and other patients whose appointments are after yours.  Also, if you no show three or more times for appointments you may be dismissed from the clinic at the providers discretion.     Again, thank you for choosing [facility].  Our hope is that these requests will decrease the amount of time that you wait before being seen by our physicians.       _____________________________________________________________  Should you have questions after your visit to [facility], please contact our office at [phone] and follow the prompts.  Our office hours are 8:00 a.m. and 4:30 p.m. Monday - Friday.  Please note that voicemails left after 4:00 p.m. may not be returned until the following business day.  We are closed weekends and major holidays.  You do have access to a nurse 24-7, just call the main number to the clinic [phone] and do  not press any options, hold on the line and a nurse will answer the phone.    For prescription refill requests, have your pharmacy contact our office and allow 72 hours.    Due to Covid, you will need to wear a mask upon entering the hospital. If you do not have a mask, a mask will be given to you at the Main Entrance upon arrival. For doctor visits, patients may have 1 support person age 32 or older with them. For treatment visits, patients can not have anyone with them due to social distancing guidelines and our immunocompromised population.

## 2021-10-15 ENCOUNTER — Ambulatory Visit: Payer: BC Managed Care – PPO | Admitting: Family Medicine

## 2021-10-15 ENCOUNTER — Ambulatory Visit: Payer: BC Managed Care – PPO | Admitting: Nurse Practitioner

## 2021-10-15 ENCOUNTER — Encounter: Payer: Self-pay | Admitting: Nurse Practitioner

## 2021-10-15 VITALS — BP 134/81 | HR 77 | Ht 70.0 in | Wt 200.0 lb

## 2021-10-15 DIAGNOSIS — E119 Type 2 diabetes mellitus without complications: Secondary | ICD-10-CM | POA: Diagnosis not present

## 2021-10-15 DIAGNOSIS — I10 Essential (primary) hypertension: Secondary | ICD-10-CM

## 2021-10-15 DIAGNOSIS — E782 Mixed hyperlipidemia: Secondary | ICD-10-CM

## 2021-10-15 LAB — POCT GLYCOSYLATED HEMOGLOBIN (HGB A1C): HbA1c POC (<> result, manual entry): 9.1 % (ref 4.0–5.6)

## 2021-10-15 MED ORDER — HUMULIN R U-500 KWIKPEN 500 UNIT/ML ~~LOC~~ SOPN: 60.0000 [IU] | PEN_INJECTOR | Freq: Three times a day (TID) | SUBCUTANEOUS | 3 refills | Status: DC

## 2021-10-15 MED ORDER — TRULICITY 4.5 MG/0.5ML ~~LOC~~ SOAJ: 4.5000 mg | SUBCUTANEOUS | 3 refills | Status: DC

## 2021-10-15 NOTE — Progress Notes (Signed)
Endocrinology Follow Up Visit      10/15/2021, 8:43 AM   Subjective:    Patient ID: Terry [name], male    DOB: [birth year].  Terry [name] is being seen in follow up after being seen in consultation for management of currently uncontrolled symptomatic diabetes requested by  Kathyrn Drown, MD.   Past Medical History:  Diagnosis Date   Diabetes mellitus without complication ([place]) 8921   Hyperlipidemia    Hypertension    Osteoarthritis     Past Surgical History:  Procedure Laterality Date   ANKLE SURGERY Left 03/28/2005   2 plates/screws   COLONOSCOPY N/A 08/04/2017   Procedure: COLONOSCOPY;  Surgeon: Rogene Houston, MD;  [facility];  Service: Endoscopy;  Laterality: N/A;   ESOPHAGEAL DILATION N/A 08/04/2017   Procedure: ESOPHAGEAL DILATION;  Surgeon: Rogene Houston, MD;  [facility];  Service: Endoscopy;  Laterality: N/A;   ESOPHAGOGASTRODUODENOSCOPY N/A 08/04/2017   Procedure: ESOPHAGOGASTRODUODENOSCOPY (EGD);  Surgeon: Rogene Houston, MD;  [facility];  Service: Endoscopy;  Laterality: N/A;   POLYPECTOMY  08/04/2017   Procedure: POLYPECTOMY;  Surgeon: Rogene Houston, MD;  [facility];  Service: Endoscopy;;   ROOT CANAL      Social History   Socioeconomic History   Marital status: Married    Spouse name: Not on file   Number of children: Not on file   Years of education: Not on file   Highest education level: Not on file  Occupational History   Not on file  Tobacco Use   Smoking status: Former    Types: Cigarettes    Quit date: 06/15/1992    Years since quitting: 29.3   Smokeless tobacco: Never  Vaping Use   Vaping Use: Never used  Substance and Sexual Activity   Alcohol use: Never   Drug use: Not Currently    Types: Marijuana    Comment: last used 1970   Sexual activity: Not on file  Other Topics Concern   Not on file  Social  History Narrative   Not on file   Social Determinants of Health   Financial Resource Strain: Not on file  Food Insecurity: Not on file  Transportation Needs: Not on file  Physical Activity: Not on file  Stress: Not on file  Social Connections: Not on file    Family History  Problem Relation Age of Onset   Hypertension Mother    Diabetes Mother    Heart disease Mother    Hyperlipidemia Mother    Cancer Father        lung    Outpatient Encounter Medications as of 10/15/2021  Medication Sig   Dulaglutide (TRULICITY) 4.5 JH/4.1DE SOPN Inject 4.5 mg as directed once a week.   amLODipine (NORVASC) 10 MG tablet Take 1 tablet (10 mg total) by mouth daily.   ascorbic acid (VITAMIN C) 500 MG tablet Take 1 tablet (500 mg total) by mouth daily.   aspirin EC 81 MG tablet Take 1 tablet (81 mg total) by mouth daily with breakfast.   Continuous Blood Gluc Sensor (DEXCOM G6 SENSOR) MISC APPLY 1 SENSOR EVERY 10 DAYS FOR GLUCOSE  MONITORING.   Continuous Blood Gluc Transmit (DEXCOM G6 TRANSMITTER) MISC USE AS DIRECTED.   fenofibrate 160 MG tablet TAKE 1 TABLET BY MOUTH ONCE A DAY.   indapamide (LOZOL) 2.5 MG tablet TAKE (1) TABLET BY MOUTH ONCE DAILY.   insulin regular human CONCENTRATED (HUMULIN R U-500 KWIKPEN) 500 UNIT/ML KwikPen Inject 60 Units into the skin 3 (three) times daily with meals.   metoprolol succinate (TOPROL-XL) 100 MG 24 hr tablet TAKE (1) TABLET BY MOUTH ONCE DAILY.   mometasone (ELOCON) 0.1 % cream APPLY TO AFFECTED AREAS TWICE DAILY AS NEEDED.   Multiple Vitamin (MULTIVITAMIN) tablet Take 1 tablet by mouth daily.   oxyCODONE-acetaminophen (PERCOCET) 10-325 MG tablet TAKE ONE TABLET BY MOUTH (4) TIMES DAILY AS NEEDED FOR PAIN.   pantoprazole (PROTONIX) 20 MG tablet Take 20 mg by mouth daily.   rosuvastatin (CRESTOR) 40 MG tablet Take 1 tablet (40 mg total) by mouth daily.   sildenafil (REVATIO) 20 MG tablet Take 1-5 tablets before sexual activity as directed. No greater that 5  tablets in a day.   TECHLITE PEN NEEDLES 32G X 4 MM MISC INJECT 4 TIMES DAILY AS DIRECTED.   valsartan (DIOVAN) 160 MG tablet Take 1 tablet (160 mg total) by mouth daily.   zinc sulfate 220 (50 Zn) MG capsule Take 1 capsule (220 mg total) by mouth daily.   [DISCONTINUED] Dulaglutide (TRULICITY) 1.5 IZ/1.2WP SOPN Inject 1.5 mg into the skin once a week.   [DISCONTINUED] glipiZIDE (GLUCOTROL XL) 5 MG 24 hr tablet Take 1 tablet (5 mg total) by mouth daily with breakfast.   [DISCONTINUED] insulin regular human CONCENTRATED (HUMULIN R U-500 KWIKPEN) 500 UNIT/ML KwikPen Inject 70-90 Units into the skin 3 (three) times daily with meals.   [DISCONTINUED] oxyCODONE-acetaminophen (PERCOCET) 10-325 MG tablet Take one tablet po 4 times daily prn pain   [DISCONTINUED] oxyCODONE-acetaminophen (PERCOCET) 10-325 MG tablet Take one tablet po 4 times daily prn pain   No facility-administered encounter medications on file as of 10/15/2021.    ALLERGIES: Allergies  Allergen Reactions   Invokana [Canagliflozin] Other (See Comments)    laryngitis   Altace [Ramipril] Cough   Metformin And Related Diarrhea    VACCINATION STATUS: Immunization History  Administered Date(s) Administered   Influenza,inj,Quad PF,6+ Mos 01/30/2014, 01/29/2016, 01/30/2017, 01/26/2018, 01/23/2019, 01/17/2020, 01/01/2021   Influenza-Unspecified 01/26/2012, 02/21/2015   Pneumococcal Polysaccharide-23 01/30/2014   Td 12/19/2014    Diabetes He presents for his follow-up diabetic visit. He has type 2 diabetes mellitus. Onset time: Diagnosed at approximate age of 55. His disease course has been fluctuating. Hypoglycemia symptoms include sweats and tremors. Pertinent negatives for hypoglycemia include no nervousness/anxiousness. Associated symptoms include fatigue and weight loss. Pertinent negatives for diabetes include no blurred vision, no polydipsia, no polyphagia and no polyuria. Hypoglycemia complications include nocturnal  hypoglycemia (mild). Symptoms are stable. Diabetic complications include nephropathy. Risk factors for coronary artery disease include diabetes mellitus, dyslipidemia, hypertension, male sex, obesity, stress and sedentary lifestyle. Current diabetic treatment includes intensive insulin program and oral agent (monotherapy) (and Trulicity). He is compliant with treatment most of the time (sometimes forgets to take his Trulicity). His weight is decreasing steadily. He is following a generally healthy diet. When asked about meal planning, he reported none. He has not had a previous visit with a dietitian. He participates in exercise intermittently. His home blood glucose trend is fluctuating dramatically. His overall blood glucose range is >200 mg/dl. (He presents today with his CGM and logs showing fluctuating glycemic profile with  lows followed by substantial highs as a result of treating lows vs liver dumping.  His POCT A1c today is 9.1%, increasing slightly from last visit of 8.6%.  Analysis of his CGM shows TIR 24%, TAR 75%, TBR < 1%.) An ACE inhibitor/angiotensin II receptor blocker is being taken. He does not see a podiatrist.Eye exam is not current.  Hypertension This is a chronic problem. The current episode started more than 1 year ago. The problem has been resolved since onset. The problem is controlled. Associated symptoms include sweats. Pertinent negatives include no blurred vision. There are no associated agents to hypertension. Risk factors for coronary artery disease include diabetes mellitus, dyslipidemia, male gender, obesity and stress. Past treatments include angiotensin blockers and calcium channel blockers. The current treatment provides mild improvement. Compliance problems include diet and exercise.  Hypertensive end-organ damage includes kidney disease. Identifiable causes of hypertension include chronic renal disease.  Hyperlipidemia This is a chronic problem. The current episode started  more than 1 year ago. The problem is uncontrolled. Recent lipid tests were reviewed and are variable. Exacerbating diseases include chronic renal disease and diabetes. Factors aggravating his hyperlipidemia include fatty foods. Current antihyperlipidemic treatment includes statins and fibric acid derivatives. The current treatment provides moderate improvement of lipids. Compliance problems include adherence to diet, adherence to exercise and psychosocial issues.  Risk factors for coronary artery disease include diabetes mellitus, dyslipidemia, hypertension, male sex and obesity.    Review of systems  Constitutional: + Minimally fluctuating body weight,  current Body mass index is 28.7 kg/m. , no fatigue, no subjective hyperthermia, no subjective hypothermia Eyes: no blurry vision, no xerophthalmia ENT: no sore throat, no nodules palpated in throat, no dysphagia/odynophagia, no hoarseness Cardiovascular: no chest pain, no shortness of breath, no palpitations, no leg swelling Respiratory: no cough, no shortness of breath Gastrointestinal: no nausea/vomiting/diarrhea Musculoskeletal: no muscle/joint aches Skin: no rashes, no hyperemia Neurological: no tremors, no numbness, no tingling, no dizziness Psychiatric: no depression, no anxiety   Objective:     BP 134/81   Pulse 77   Ht '5\' 10"'$  (1.778 m)   Wt 200 lb (90.7 kg)   BMI 28.70 kg/m   Wt Readings from Last 3 Encounters:  10/15/21 200 lb (90.7 kg)  08/13/21 205 lb 14.4 oz (93.4 kg)  07/16/21 206 lb 6.4 oz (93.6 kg)    BP Readings from Last 3 Encounters:  10/15/21 134/81  08/13/21 124/67  07/16/21 134/80     Physical Exam- Limited  Constitutional:  Body mass index is 28.7 kg/m. , not in acute distress, normal state of mind Eyes:  EOMI, no exophthalmos Neck: Supple Cardiovascular: RRR, no murmurs, rubs, or gallops, no edema Respiratory: Adequate breathing efforts, no crackles, rales, rhonchi, or wheezing Musculoskeletal: no  gross deformities, strength intact in all four extremities, no gross restriction of joint movements Skin:  no rashes, no hyperemia Neurological: no tremor with outstretched hands  Diabetic Foot Exam - Simple   Simple Foot Form Diabetic Foot exam was performed with the following findings: Yes 10/15/2021  8:39 AM  Visual Inspection No deformities, no ulcerations, no other skin breakdown bilaterally: Yes Sensation Testing Intact to touch and monofilament testing bilaterally: Yes Pulse Check Posterior Tibialis and Dorsalis pulse intact bilaterally: Yes Comments     CMP ( most recent) CMP     Component Value Date/Time   NA 138 08/06/2021 0805   NA 141 02/15/2021 0803   K 5.2 (H) 08/06/2021 0805   CL 106 08/06/2021 0805  CO2 25 08/06/2021 0805   GLUCOSE 332 (H) 08/06/2021 0805   BUN 28 (H) 08/06/2021 0805   BUN 30 (H) 02/15/2021 0803   CREATININE 1.65 (H) 08/06/2021 0805   CREATININE 0.91 02/25/2014 0727   CALCIUM 9.3 08/06/2021 0805   PROT 7.2 08/06/2021 0805   PROT 7.2 03/25/2021 0806   ALBUMIN 4.2 08/06/2021 0805   ALBUMIN 5.1 (H) 12/18/2020 0804   AST 26 08/06/2021 0805   ALT 30 08/06/2021 0805   ALKPHOS 33 (L) 08/06/2021 0805   BILITOT 0.6 08/06/2021 0805   BILITOT 0.3 12/18/2020 0804   GFRNONAA 48 (L) 08/06/2021 0805   GFRAA 66 01/17/2020 0928     Diabetic Labs (most recent): Lab Results  Component Value Date   HGBA1C 9.1 10/15/2021   HGBA1C 8.6 (A) 07/09/2021   HGBA1C 9.1 (H) 03/25/2021   MICROALBUR 150 07/09/2021   MICROALBUR 150 03/09/2020   MICROALBUR 3.36 (H) 05/11/2013     Lipid Panel ( most recent) Lipid Panel     Component Value Date/Time   CHOL 141 08/06/2021 0806   CHOL 216 (H) 03/25/2021 0806   TRIG 262 (H) 08/06/2021 0806   HDL 25 (L) 08/06/2021 0806   HDL 20 (L) 03/25/2021 0806   CHOLHDL 5.6 08/06/2021 0806   VLDL 52 (H) 08/06/2021 0806   LDLCALC 64 08/06/2021 0806   LDLCALC Comment (A) 03/25/2021 0806   LABVLDL Comment (A) 03/25/2021  0806      Lab Results  Component Value Date   TSH 0.713 04/30/2021   TSH 1.280 06/29/2020   TSH 1.470 11/11/2019   TSH 2.144 04/04/2019   TSH 0.986 07/01/2015   FREET4 1.39 06/29/2020   FREET4 1.44 11/11/2019           Assessment & Plan:   1) DM type 2 without retinopathy ([place])  - Terry [name] has currently uncontrolled symptomatic type 2 DM since  61 years of age.  He presents today with his CGM and logs showing fluctuating glycemic profile with lows followed by substantial highs as a result of treating lows vs liver dumping.  His POCT A1c today is 9.1%, increasing slightly from last visit of 8.6%.  Analysis of his CGM shows TIR 24%, TAR 75%, TBR < 1%.   Recent labs reviewed.  His POCT UM shows microalbuminuria of 150.  He does have CKD stage 3a.  - I had a long discussion with him about the progressive nature of diabetes and the pathology behind its complications. -his diabetes is complicated by CKD stage 3a and he remains at a high risk for more acute and chronic complications which include CAD, CVA, CKD, retinopathy, and neuropathy. These are all discussed in detail with him.  - Nutritional counseling repeated at each appointment due to patients tendency to fall back in to old habits.  - The patient admits there is a room for improvement in their diet and drink choices. -  Suggestion is made for the patient to avoid simple carbohydrates from their diet including Cakes, Sweet Desserts / Pastries, Ice Cream, Soda (diet and regular), Sweet Tea, Candies, Chips, Cookies, Sweet Pastries, Store Bought Juices, Alcohol in Excess of 1-2 drinks a day, Artificial Sweeteners, Coffee Creamer, and "Sugar-free" Products. This will help patient to have stable blood glucose profile and potentially avoid unintended weight gain.   - I encouraged the patient to switch to unprocessed or minimally processed complex starch and increased protein intake (animal or plant source), fruits, and  vegetables.   -  Patient is advised to stick to a routine mealtimes to eat 3 meals a day and avoid unnecessary snacks (to snack only to correct hypoglycemia).  - I have approached him with the following individualized plan to manage  his diabetes and patient agrees:   -We discussed necessary changes in diet to prevent dramatic fluctuations in glucose and for safety purposes he needs to be eating to inject his insulin.  -Will increase his Trulicity to 3 mg SQ weekly (using 2 of his 1.5 mg injections weekly until he depletes his current supply), then will increase to 4.5 mg SQ weekly.  Due to drastic drops in glucose will stop his Glipizide altogether and lower his U 500 to 60 units TID with meals if glucose is above 90 and he is eating.   -He is encouraged to continue monitoring glucose 4 times daily (using his CGM), before meals and before bed, and to call the clinic if he has readings less than 70 or greater than 300 for 3 tests in a row.  - he is warned not to take insulin without proper monitoring per orders.  - Specific targets for  A1c;  LDL, HDL,  and Triglycerides were discussed with the patient.  2) Blood Pressure /Hypertension: His blood pressure is controlled to target.  He is advised to continue Diovan 160 mg, Amlodipine 10 mg po daily, Indapamide 2.5 mg po daily, and Metoprolol 100 mg po daily.      3) Lipids/Hyperlipidemia:    His most recent lipid panel from 08/06/21 shows controlled LDL of 64 and elevated but significantly improved triglycerides of 262.  He is advised to continue Crestor 40 mg po daily at bedtime and Fenofibrate 160 mg po daily.  Side effects and precautions discussed with him.  We discussed the need to avoid fried foods and butter to reduce his risk of cardiovascular events.  4)  Weight/Diet:  His Body mass index is 28.7 kg/m.  -   he is a candidate for weight loss. I discussed with him the fact that loss of 5 - 10% of his  current body weight will have the most  impact on his diabetes management.  Exercise, and detailed carbohydrates information provided  -  detailed on discharge instructions.  5) Chronic Care/Health Maintenance: -he on ACEI/ARB and Statin medications and  is encouraged to initiate and continue to follow up with Ophthalmology, Dentist, Podiatrist at least yearly or according to recommendations, and advised to stay away from smoking. I have recommended yearly flu vaccine and pneumonia vaccine at least every 5 years; moderate intensity exercise for up to 150 minutes weekly; and  sleep for at least 7 hours a day.  - he is advised to maintain close follow up with Kathyrn Drown, MD for primary care needs, as well as his other providers for optimal and coordinated care.  6) Hypercalcemia:  Patient has had several instances of elevated calcium, with the highest level being at 10.9 mg/dL. A corresponding intact PTH level was low, at 11.   - Patient also has vitamin D deficiency- corrected with supplementation with the last level being 40.6.  - No apparent complications from hypercalcemia/hyperparathyroidism: no history of  nephrolithiasis,  osteoporosis,fragility fractures. No abdominal pain, no major mood disorders, no bone pain.  - At this time the etiology of his hypercalcemia does not appear to be endocrine related.  His PCP had initiated consult to hematologist/oncologist to investigate for other potential causes.  He has been seen by hematologist for  workup and was determined to be taking in too much calcium through supplements such as Tums.  His calcium levels have improved since stopping those supplements.      I spent 30 minutes in the care of the patient today including review of labs from [place], Lipids, Thyroid Function, Hematology (current and previous including abstractions from other facilities); face-to-face time discussing  his blood glucose readings/logs, discussing hypoglycemia and hyperglycemia episodes and symptoms,  medications doses, his options of short and long term treatment based on the latest standards of care / guidelines;  discussion about incorporating lifestyle medicine;  and documenting the encounter. Risk reduction counseling performed per USPSTF guidelines to reduce obesity and cardiovascular risk factors.     Please refer to Patient Instructions for Blood Glucose Monitoring and Insulin/Medications Dosing Guide"  in media tab for additional information. Please  also refer to " Patient Self Inventory" in the Media  tab for reviewed elements of pertinent patient history.  Terry [name] participated in the discussions, expressed understanding, and voiced agreement with the above plans.  All questions were answered to his satisfaction. he is encouraged to contact clinic should he have any questions or concerns prior to his return visit.   Follow up plan: - Return in about 3 months (around 01/15/2022) for Diabetes F/U with A1c in office, No previsit labs, Bring meter and logs.   Rayetta Pigg, [facility] [address] [place], [place] [address] Phone: [phone] Fax: [phone]  10/15/2021, 8:43 AM

## 2021-10-18 ENCOUNTER — Other Ambulatory Visit: Payer: Self-pay | Admitting: Family Medicine

## 2021-10-18 MED ORDER — OXYCODONE-ACETAMINOPHEN 10-325 MG PO TABS: ORAL_TABLET | ORAL | 0 refills | Status: DC

## 2021-10-22 ENCOUNTER — Ambulatory Visit (INDEPENDENT_AMBULATORY_CARE_PROVIDER_SITE_OTHER): Payer: BC Managed Care – PPO | Admitting: Family Medicine

## 2021-10-22 ENCOUNTER — Encounter: Payer: Self-pay | Admitting: Family Medicine

## 2021-10-22 VITALS — BP 173/76 | HR 69 | Temp 99.0°F | Ht 70.0 in | Wt 205.6 lb

## 2021-10-22 DIAGNOSIS — Z79891 Long term (current) use of opiate analgesic: Secondary | ICD-10-CM

## 2021-10-22 DIAGNOSIS — E1169 Type 2 diabetes mellitus with other specified complication: Secondary | ICD-10-CM

## 2021-10-22 DIAGNOSIS — I10 Essential (primary) hypertension: Secondary | ICD-10-CM

## 2021-10-22 DIAGNOSIS — E119 Type 2 diabetes mellitus without complications: Secondary | ICD-10-CM | POA: Diagnosis not present

## 2021-10-22 DIAGNOSIS — E785 Hyperlipidemia, unspecified: Secondary | ICD-10-CM

## 2021-10-22 MED ORDER — OXYCODONE-ACETAMINOPHEN 10-325 MG PO TABS: ORAL_TABLET | ORAL | 0 refills | Status: DC

## 2021-10-22 MED ORDER — NIFEDIPINE ER OSMOTIC RELEASE 30 MG PO TB24: 30.0000 mg | ORAL_TABLET | Freq: Every day | ORAL | 2 refills | Status: DC

## 2021-10-22 MED ORDER — TRIAMCINOLONE ACETONIDE 0.1 % EX CREA: TOPICAL_CREAM | CUTANEOUS | 1 refills | Status: DC

## 2021-10-22 NOTE — Progress Notes (Signed)
Subjective:    Patient ID: Terry [name], male    DOB: 05-Jul-1960, 61 y.o.   MRN: [id number]  HPI  Patient here for pain medication 3 month  follow up  This patient was seen today for chronic pain  The medication list was reviewed and updated.  Location of Pain for which the patient has been treated with regarding narcotics: Hips knees hands  Onset of this pain: Been present for years   -Compliance with medication: Good compliance  - Number patient states they take daily: 4/day  -when was the last dose patient took?  Earlier today  The patient was advised the importance of maintaining medication and not using illegal substances with these.  Here for refills and follow up  The patient was educated that we can provide 3 monthly scripts for their medication, it is their responsibility to follow the instructions.  Side effects or complications from medications: Denies side effects  Patient is aware that pain medications are meant to minimize the severity of the pain to allow their pain levels to improve to allow for better function. They are aware of that pain medications cannot totally remove their pain.  Due for UDT ( at least once per year) : Today  Scale of 1 to 10 ( 1 is least 10 is most) Your pain level without the medicine: 8 Your pain level with medication 4  Scale 1 to 10 ( 1-helps very little, 10 helps very well) How well does your pain medication reduce your pain so you can function better through out the day? 7  Quality of the pain: Throbbing aching stiffness  Persistence of the pain: Present all the time  Modifying factors: Worse with activity       Patient has a raw place on top lip.  He says it's like his teeth are rubbing against his lip. Has been going on for 2 weeks.  Review of Systems     Objective:   Physical Exam  General-in no acute distress Eyes-no discharge Lungs-respiratory rate normal, CTA CV-no murmurs,RRR Extremities skin warm  dry no edema Neuro grossly normal Behavior normal, alert       Assessment & Plan:  1. Encounter for long-term opiate analgesic use The patient was seen in followup for chronic pain. A review over at their current pain status was discussed. Drug registry was checked. Prescriptions were given.  Regular follow-up recommended. Discussion was held regarding the importance of compliance with medication as well as pain medication contract.  Patient was informed that medication may cause drowsiness and should not be combined  with other medications/alcohol or street drugs. If the patient feels medication is causing altered alertness then do not drive or operate dangerous equipment.  Should be noted that the patient appears to be meeting appropriate use of opioids and response.  Evidenced by improved function and decent pain control without significant side effects and no evidence of overt aberrancy issues.  Upon discussion with the patient today they understand that opioid therapy is optional and they feel that the pain has been refractory to reasonable conservative measures and is significant and affecting quality of life enough to warrant ongoing therapy and wishes to continue opioids.  Refills were provided.  - ToxASSURE Select 13 (MW), Urine  2. Essential hypertension, benign HTN- patient seen for follow-up regarding HTN.   Diet, medication compliance, appropriate labs and refills were completed.   Importance of keeping blood pressure under good control to lessen the risk of complications  discussed Regular follow-up visits discussed  Did not do a good job with medicine recently.  Lost his amlodipine.  Cannot get it filled for couple more months Recommend nifedipine 30 mg extended release 1/day Patient to follow-up within 4 weeks to recheck blood pressure  3. Hyperlipidemia associated with type 2 diabetes mellitus ([place]) Cholesterol decent control healthy diet recommended continue  medication  4. DM type 2 without retinopathy ([place]) A1c above goal followed by the diabetic doctor Healthy diet recommended regular physical activity recommended  Patient needs to follow-up in 4 weeks to recheck blood pressure see discussion above  Recommend that he try Vaseline along with a low-dose steroid if not seeing improvement's with the topical over the next 10 to 14 days recommend referral to dermatology for lip irritation

## 2021-10-27 ENCOUNTER — Other Ambulatory Visit: Payer: Self-pay | Admitting: Nurse Practitioner

## 2021-10-28 ENCOUNTER — Encounter: Payer: Self-pay | Admitting: Family Medicine

## 2021-10-28 LAB — TOXASSURE SELECT 13 (MW), URINE

## 2021-10-28 NOTE — Progress Notes (Signed)
Please mail a copy to the patient thank you

## 2021-11-01 ENCOUNTER — Other Ambulatory Visit: Payer: Self-pay | Admitting: Nurse Practitioner

## 2021-11-04 NOTE — Progress Notes (Signed)
Cardiology Office Note    Date:  11/05/2021   ID:  Terry [name], DOB [birth year], MRN [id number]  PCP:  Kathyrn Drown, MD  Cardiologist: Carlyle Dolly, MD    Chief Complaint  Patient presents with   Follow-up    History of Present Illness:    Terry [name] is a 61 y.o. male with past medical history of paroxysmal atrial fibrillation (transient episode in 03/2019 while admitted for COVID and DKA with no recurrence of outpatient monitor), Type 2 DM, HTN and HLD who presents to the office today for overdue follow-up.  He most recently had a telehealth visit with Dr. Harl Bowie in 06/2019 and denied any recent chest pain or palpitations at that time. Cardizem CD was discontinued and he was restarted on his prior Amlodipine 10 mg daily with plans for follow-up in 1 year.  In talking with the patient today, he reports overall doing well since his last office visit. He remains active in doing carpentry work and reports he has noticed more shortness of breath over the summer and also in the setting of being without Amlodipine for his blood pressure but denies any chest pain or palpitations. No specific orthopnea, PND or pitting edema.  He misplaced his bottle of Amlodipine last month and was started on Procardia 30 mg daily by his PCP but BP has remained elevated when checked at home. BP was initially recorded at 170/80 during today's visit, rechecked and at 162/82.   Past Medical History:  Diagnosis Date   Diabetes mellitus without complication ([place]) 7494   Hyperlipidemia    Hypertension    Osteoarthritis     Past Surgical History:  Procedure Laterality Date   ANKLE SURGERY Left 03/28/2005   2 plates/screws   COLONOSCOPY N/A 08/04/2017   Procedure: COLONOSCOPY;  Surgeon: Rogene Houston, MD;  [facility]: [facility];  Service: Endoscopy;  Laterality: N/A;   ESOPHAGEAL DILATION N/A 08/04/2017   Procedure: ESOPHAGEAL DILATION;  Surgeon: Rogene Houston, MD;  Location[facility];  Service: Endoscopy;  Laterality: N/A;   ESOPHAGOGASTRODUODENOSCOPY N/A 08/04/2017   Procedure: ESOPHAGOGASTRODUODENOSCOPY (EGD);  Surgeon: Rogene Houston, MD;  [facility];  Service: Endoscopy;  Laterality: N/A;   POLYPECTOMY  08/04/2017   Procedure: POLYPECTOMY;  Surgeon: Rogene Houston, MD;  [facility]: [facility];  Service: Endoscopy;;   ROOT CANAL      Current Medications: Outpatient Medications Prior to Visit  Medication Sig Dispense Refill   ascorbic acid (VITAMIN C) 500 MG tablet Take 1 tablet (500 mg total) by mouth daily. 30 tablet 2   aspirin EC 81 MG tablet Take 1 tablet (81 mg total) by mouth daily with breakfast. 120 tablet 3   Continuous Blood Gluc Sensor (DEXCOM G6 SENSOR) MISC APPLY 1 SENSOR EVERY 10 DAYS FOR GLUCOSE MONITORING. 3 each 2   Continuous Blood Gluc Transmit (DEXCOM G6 TRANSMITTER) MISC USE AS DIRECTED. 1 each 3   Dulaglutide (TRULICITY) 4.5 WH/6.7RF SOPN Inject 4.5 mg as directed once a week. 6 mL 3   fenofibrate 160 MG tablet TAKE 1 TABLET BY MOUTH ONCE A DAY. 90 tablet 0   indapamide (LOZOL) 2.5 MG tablet TAKE (1) TABLET BY MOUTH ONCE DAILY. 90 tablet 3   insulin regular human CONCENTRATED (HUMULIN R U-500 KWIKPEN) 500 UNIT/ML KwikPen Inject 60 Units into the skin 3 (three) times daily with meals. 45 mL 3   metoprolol succinate (TOPROL-XL) 100 MG 24 hr tablet TAKE (1) TABLET  BY MOUTH ONCE DAILY. 90 tablet 3   mometasone (ELOCON) 0.1 % cream APPLY TO AFFECTED AREAS TWICE DAILY AS NEEDED. 45 g 2   Multiple Vitamin (MULTIVITAMIN) tablet Take 1 tablet by mouth daily.     oxyCODONE-acetaminophen (PERCOCET) 10-325 MG tablet 1 taken 4 times daily as needed for pain 120 tablet 0   oxyCODONE-acetaminophen (PERCOCET) 10-325 MG tablet TAKE ONE TABLET BY MOUTH (4) TIMES DAILY AS NEEDED FOR PAIN. 120 tablet 0   oxyCODONE-acetaminophen (PERCOCET) 10-325 MG tablet 1 taken 4 times daily as needed for pain 120 tablet 0   pantoprazole (PROTONIX) 20 MG  tablet Take 20 mg by mouth daily.     rosuvastatin (CRESTOR) 40 MG tablet Take 1 tablet (40 mg total) by mouth daily. 90 tablet 3   sildenafil (REVATIO) 20 MG tablet Take 1-5 tablets before sexual activity as directed. No greater that 5 tablets in a day. 25 tablet 6   TECHLITE PEN NEEDLES 32G X 4 MM MISC INJECT 4 TIMES DAILY AS DIRECTED. 100 each PRN   triamcinolone cream (KENALOG) 0.1 % Apply thin amount to top inner lip twice daily for 10 to 14 days till healed 15 g 1   TRULICITY 1.5 ZC/5.8IF SOPN Inject 1.5 mg into the skin once a week. 2 mL 2   valsartan (DIOVAN) 160 MG tablet Take 1 tablet (160 mg total) by mouth daily. 90 tablet 3   zinc sulfate 220 (50 Zn) MG capsule Take 1 capsule (220 mg total) by mouth daily. 30 capsule 1   amLODipine (NORVASC) 10 MG tablet TAKE (1) TABLET BY MOUTH ONCE DAILY. 90 tablet 0   NIFEdipine (PROCARDIA-XL/NIFEDICAL-XL) 30 MG 24 hr tablet Take 1 tablet (30 mg total) by mouth daily. 30 tablet 2   No facility-administered medications prior to visit.     Allergies:   Invokana [canagliflozin], Altace [ramipril], and Metformin and related   Social History   Socioeconomic History   Marital status: Married    Spouse name: Not on file   Number of children: Not on file   Years of education: Not on file   Highest education level: Not on file  Occupational History   Not on file  Tobacco Use   Smoking status: Former    Types: Cigarettes    Quit date: 06/15/1992    Years since quitting: 29.4   Smokeless tobacco: Never  Vaping Use   Vaping Use: Never used  Substance and Sexual Activity   Alcohol use: Never   Drug use: Not Currently    Types: Marijuana    Comment: last used 1970   Sexual activity: Not on file  Other Topics Concern   Not on file  Social History Narrative   Not on file   Social Determinants of Health   Financial Resource Strain: Not on file  Food Insecurity: Not on file  Transportation Needs: Not on file  Physical Activity: Not on  file  Stress: Not on file  Social Connections: Not on file     Family History:  The patient's family history includes Cancer in his father; Diabetes in his mother; Heart disease in his mother; Hyperlipidemia in his mother; Hypertension in his mother.   Review of Systems:    Please see the history of present illness.     All other systems reviewed and are otherwise negative except as noted above.   Physical Exam:    VS:  BP (!) 162/82   Pulse 89   Ht '5\' 10"'$  (1.778  m)   Wt 200 lb (90.7 kg)   SpO2 98%   BMI 28.70 kg/m    General: Pleasant male appearing in no acute distress. Head: Normocephalic, atraumatic. Neck: No carotid bruits. JVD not elevated.  Lungs: Respirations regular and unlabored, without wheezes or rales.  Heart: Regular rate and rhythm. No S3 or S4.  No murmur, no rubs, or gallops appreciated. Abdomen: Appears non-distended. No obvious abdominal masses. Msk:  Strength and tone appear normal for age. No obvious joint deformities or effusions. Extremities: No clubbing or cyanosis. No pitting edema.  Distal pedal pulses are 2+ bilaterally. Neuro: Alert and oriented X 3. Moves all extremities spontaneously. No focal deficits noted. Psych:  Responds to questions appropriately with a normal affect. Skin: No rashes or lesions noted  Wt Readings from Last 3 Encounters:  11/05/21 200 lb (90.7 kg)  10/22/21 205 lb 9.6 oz (93.3 kg)  10/15/21 200 lb (90.7 kg)     Studies/Labs Reviewed:   EKG:  EKG is ordered today. The EKG ordered today demonstrates NSR, HR 89 with LVH and associated TWI along lateral leads which is likely due to repol and similar to prior tracings.   Recent Labs: 03/26/2021: Magnesium 1.7 04/30/2021: TSH 0.713 08/06/2021: ALT 30; BUN 28; Creatinine, Ser 1.65; Potassium 5.2; Sodium 138   Lipid Panel    Component Value Date/Time   CHOL 141 08/06/2021 0806   CHOL 216 (H) 03/25/2021 0806   TRIG 262 (H) 08/06/2021 0806   HDL 25 (L) 08/06/2021 0806    HDL 20 (L) 03/25/2021 0806   CHOLHDL 5.6 08/06/2021 0806   VLDL 52 (H) 08/06/2021 0806   LDLCALC 64 08/06/2021 0806   LDLCALC Comment (A) 03/25/2021 0806    Additional studies/ records that were reviewed today include:   Echocardiogram: 03/2019 IMPRESSIONS     1. Left ventricular ejection fraction, by visual estimation, is 65 to  70%. The left ventricle has hyperdynamic function. There is no left  ventricular hypertrophy.   2. Global right ventricle has normal systolic function.The right  ventricular size is normal.   3. Left atrial size was normal.   4. Right atrial size was normal.   5. The mitral valve is normal in structure. No evidence of mitral valve  regurgitation.   6. The tricuspid valve is normal in structure.   7. The aortic valve has an indeterminant number of cusps. Aortic valve  regurgitation is not visualized. Mild to moderate aortic valve  sclerosis/calcification without any evidence of aortic stenosis.   8. The pulmonic valve was not well visualized. Pulmonic valve  regurgitation is not visualized.   9. TR signal is inadequate for assessing pulmonary artery systolic  pressure.   Event Monitor: 06/2019 21 day event monitor Min HR 60, Max HR 132, Avg HR 87 No symptoms reported Telemetry tracings show normal sinus rhythm  Assessment:    1. History of atrial fibrillation   2. Essential hypertension, benign   3. Hyperlipidemia associated with type 2 diabetes mellitus ([place])      Plan:   In order of problems listed above:  1. History of Atrial Fibrillation - He experienced an isolated episode of atrial fibrillation during admission in 03/2019 while there for COVID and DKA with conversion back to normal sinus rhythm and he did not have any recurrence by outpatient monitor. - He denies any recent palpitations and is in NSR by examination and EKG today.   2. HTN - BP is elevated at 162/82 during today's visit  and has also been elevated at home. He was  recently switched from Amlodipine to Procardia as he had misplaced his bottle of Amlodipine and this would not be covered by his insurance. He was only switched to Procardia-XL 30 mg daily and reviewed either further titrating this or switching back to Amlodipine as he could pay the cash price for this which is less than $9 per month. He prefers to switch back to Amlodipine, therefore will stop Procardia and restart Amlodipine 10 mg daily. Continue other medications including Lozol 2.5 mg daily, Toprol-XL 100 mg daily and Valsartan 160 mg daily.  3. HLD - Followed by his PCP. Recent FLP in 07/2021 showed total cholesterol 141, triglycerides 262 (previously 1100 in 02/2021), HDL 25 and LDL 64. He remains on Crestor 40 mg daily and Fenofibrate 160 mg daily.  4. Type 2 DM - Followed by Endocrinology. Hemoglobin A1c was at 9.1 in 09/2021.   Medication Adjustments/Labs and Tests Ordered: Current medicines are reviewed at length with the patient today.  Concerns regarding medicines are outlined above.  Medication changes, Labs and Tests ordered today are listed in the Patient Instructions below. Patient Instructions  Medication Instructions:   Stop Procardia.  Restart Amlodipine '10mg'$  daily.   *If you need a refill on your cardiac medications before your next appointment, please call your pharmacy*   Follow-Up: At [facility], you and your health needs are our priority.  As part of our continuing mission to provide you with exceptional heart care, we have created designated Provider Care Teams.  These Care Teams include your primary Cardiologist (physician) and Advanced Practice Providers (APPs -  Physician Assistants and Nurse Practitioners) who all work together to provide you with the care you need, when you need it.  We recommend signing up for the patient portal called "MyChart".  Sign up information is provided on this After Visit Summary.  MyChart is used to connect with patients for  Virtual Visits (Telemedicine).  Patients are able to view lab/test results, encounter notes, upcoming appointments, etc.  Non-urgent messages can be sent to your provider as well.   To learn more about what you can do with MyChart, go to [email or web address].    Your next appointment:   Follow-up with Dr. Harl Bowie or Bernerd Pho, PA-C as needed.    Other Instructions  Follow BP at home and report back if this remains elevated.    Important Information About Sugar         Signed, Terry Heritage, PA-C  11/05/2021 4:28 PM    [facility] [address] [place], [place] [address] Phone: [phone] Fax: [phone]

## 2021-11-05 ENCOUNTER — Encounter: Payer: Self-pay | Admitting: Student

## 2021-11-05 ENCOUNTER — Ambulatory Visit: Payer: BC Managed Care – PPO | Admitting: Student

## 2021-11-05 VITALS — BP 162/82 | HR 89 | Ht 70.0 in | Wt 200.0 lb

## 2021-11-05 DIAGNOSIS — Z8679 Personal history of other diseases of the circulatory system: Secondary | ICD-10-CM | POA: Diagnosis not present

## 2021-11-05 DIAGNOSIS — I10 Essential (primary) hypertension: Secondary | ICD-10-CM

## 2021-11-05 DIAGNOSIS — E1169 Type 2 diabetes mellitus with other specified complication: Secondary | ICD-10-CM

## 2021-11-05 DIAGNOSIS — E785 Hyperlipidemia, unspecified: Secondary | ICD-10-CM

## 2021-11-05 MED ORDER — AMLODIPINE BESYLATE 10 MG PO TABS: 10.0000 mg | ORAL_TABLET | Freq: Every day | ORAL | 0 refills | Status: DC

## 2021-11-05 MED ORDER — AMLODIPINE BESYLATE 10 MG PO TABS: 10.0000 mg | ORAL_TABLET | Freq: Every day | ORAL | 3 refills | Status: DC

## 2021-11-05 NOTE — Patient Instructions (Signed)
Medication Instructions:   Stop Procardia.  Restart Amlodipine '10mg'$  daily.   *If you need a refill on your cardiac medications before your next appointment, please call your pharmacy*   Follow-Up: At [facility], you and your health needs are our priority.  As part of our continuing mission to provide you with exceptional heart care, we have created designated Provider Care Teams.  These Care Teams include your primary Cardiologist (physician) and Advanced Practice Providers (APPs -  Physician Assistants and Nurse Practitioners) who all work together to provide you with the care you need, when you need it.  We recommend signing up for the patient portal called "MyChart".  Sign up information is provided on this After Visit Summary.  MyChart is used to connect with patients for Virtual Visits (Telemedicine).  Patients are able to view lab/test results, encounter notes, upcoming appointments, etc.  Non-urgent messages can be sent to your provider as well.   To learn more about what you can do with MyChart, go to [email or web address].    Your next appointment:   Follow-up with Dr. Harl Bowie or Bernerd Pho, PA-C as needed.    Other Instructions  Follow BP at home and report back if this remains elevated.    Important Information About Sugar

## 2021-11-10 ENCOUNTER — Other Ambulatory Visit: Payer: Self-pay | Admitting: Nurse Practitioner

## 2021-11-10 DIAGNOSIS — E782 Mixed hyperlipidemia: Secondary | ICD-10-CM

## 2021-11-16 ENCOUNTER — Ambulatory Visit (INDEPENDENT_AMBULATORY_CARE_PROVIDER_SITE_OTHER): Payer: BC Managed Care – PPO | Admitting: Family Medicine

## 2021-11-16 ENCOUNTER — Encounter: Payer: Self-pay | Admitting: Family Medicine

## 2021-11-16 VITALS — BP 138/72 | HR 103 | Temp 99.3°F | Wt 198.8 lb

## 2021-11-16 DIAGNOSIS — B349 Viral infection, unspecified: Secondary | ICD-10-CM | POA: Diagnosis not present

## 2021-11-16 DIAGNOSIS — I10 Essential (primary) hypertension: Secondary | ICD-10-CM

## 2021-11-16 NOTE — Progress Notes (Signed)
   Subjective:    Patient ID: Terry [name], male    DOB: 05-16-1960, 61 y.o.   MRN: [id number]  HPI Pt arrives to follow up on blood pressure. Pt states his blood pressure has been elevated. Cardiology placed him Amlodpine 10 mg-pt was already on but lost his script.  Patient was able to go ahead and get the prescription filled he has been trying to take his medicine watch his diet Pt states he woke up with a "sickly" feeling this morning.    Review of Systems     Objective:   Physical Exam  Gen-NAD not toxic TMS-normal bilateral T- normal no redness Chest-CTA respiratory rate normal no crackles CV RRR no murmur Skin-warm dry Neuro-grossly normal       Assessment & Plan:  Viral syndrome-should gradually get better-watch closely for other symptoms-unlikely to be tick related illness  Watch closely how symptoms go over the next few days if progressively worse notify us  HTN good control currently follow-up in November for more pain management Healthy diet minimize salt take medicines on a regular basis lab work later this year

## 2021-11-23 ENCOUNTER — Encounter: Payer: Self-pay | Admitting: Family Medicine

## 2021-11-23 ENCOUNTER — Inpatient Hospital Stay (HOSPITAL_COMMUNITY): Payer: BC Managed Care – PPO

## 2021-11-23 ENCOUNTER — Other Ambulatory Visit: Payer: Self-pay

## 2021-11-23 ENCOUNTER — Inpatient Hospital Stay (HOSPITAL_COMMUNITY)
Admission: EM | Admit: 2021-11-23 | Discharge: 2021-11-26 | DRG: 638 | Disposition: A | Discharge status: Home / Self Care | Payer: BC Managed Care – PPO | Attending: Family Medicine | Admitting: Family Medicine

## 2021-11-23 ENCOUNTER — Telehealth: Payer: Self-pay | Admitting: Nurse Practitioner

## 2021-11-23 ENCOUNTER — Encounter (HOSPITAL_COMMUNITY): Payer: Self-pay | Admitting: Emergency Medicine

## 2021-11-23 ENCOUNTER — Emergency Department (HOSPITAL_COMMUNITY): Payer: BC Managed Care – PPO

## 2021-11-23 ENCOUNTER — Ambulatory Visit (INDEPENDENT_AMBULATORY_CARE_PROVIDER_SITE_OTHER): Payer: BC Managed Care – PPO | Admitting: Family Medicine

## 2021-11-23 DIAGNOSIS — Z8249 Family history of ischemic heart disease and other diseases of the circulatory system: Secondary | ICD-10-CM | POA: Diagnosis not present

## 2021-11-23 DIAGNOSIS — K3184 Gastroparesis: Secondary | ICD-10-CM | POA: Diagnosis not present

## 2021-11-23 DIAGNOSIS — Z801 Family history of malignant neoplasm of trachea, bronchus and lung: Secondary | ICD-10-CM | POA: Diagnosis not present

## 2021-11-23 DIAGNOSIS — I10 Essential (primary) hypertension: Secondary | ICD-10-CM | POA: Diagnosis not present

## 2021-11-23 DIAGNOSIS — K2100 Gastro-esophageal reflux disease with esophagitis, without bleeding: Secondary | ICD-10-CM | POA: Diagnosis present

## 2021-11-23 DIAGNOSIS — E8881 Metabolic syndrome: Secondary | ICD-10-CM | POA: Diagnosis not present

## 2021-11-23 DIAGNOSIS — Z79899 Other long term (current) drug therapy: Secondary | ICD-10-CM | POA: Diagnosis not present

## 2021-11-23 DIAGNOSIS — Z87891 Personal history of nicotine dependence: Secondary | ICD-10-CM | POA: Diagnosis not present

## 2021-11-23 DIAGNOSIS — E1169 Type 2 diabetes mellitus with other specified complication: Secondary | ICD-10-CM | POA: Diagnosis not present

## 2021-11-23 DIAGNOSIS — Z833 Family history of diabetes mellitus: Secondary | ICD-10-CM

## 2021-11-23 DIAGNOSIS — E1143 Type 2 diabetes mellitus with diabetic autonomic (poly)neuropathy: Secondary | ICD-10-CM | POA: Diagnosis present

## 2021-11-23 DIAGNOSIS — R112 Nausea with vomiting, unspecified: Secondary | ICD-10-CM | POA: Diagnosis not present

## 2021-11-23 DIAGNOSIS — D72829 Elevated white blood cell count, unspecified: Secondary | ICD-10-CM | POA: Diagnosis present

## 2021-11-23 DIAGNOSIS — Z7982 Long term (current) use of aspirin: Secondary | ICD-10-CM | POA: Diagnosis not present

## 2021-11-23 DIAGNOSIS — K219 Gastro-esophageal reflux disease without esophagitis: Secondary | ICD-10-CM | POA: Diagnosis not present

## 2021-11-23 DIAGNOSIS — I480 Paroxysmal atrial fibrillation: Secondary | ICD-10-CM | POA: Diagnosis present

## 2021-11-23 DIAGNOSIS — E782 Mixed hyperlipidemia: Secondary | ICD-10-CM | POA: Diagnosis present

## 2021-11-23 DIAGNOSIS — T383X5A Adverse effect of insulin and oral hypoglycemic [antidiabetic] drugs, initial encounter: Secondary | ICD-10-CM | POA: Diagnosis present

## 2021-11-23 DIAGNOSIS — Z20822 Contact with and (suspected) exposure to covid-19: Secondary | ICD-10-CM | POA: Diagnosis present

## 2021-11-23 DIAGNOSIS — N179 Acute kidney failure, unspecified: Secondary | ICD-10-CM | POA: Diagnosis not present

## 2021-11-23 DIAGNOSIS — Z794 Long term (current) use of insulin: Secondary | ICD-10-CM

## 2021-11-23 DIAGNOSIS — E871 Hypo-osmolality and hyponatremia: Secondary | ICD-10-CM | POA: Diagnosis present

## 2021-11-23 DIAGNOSIS — E1110 Type 2 diabetes mellitus with ketoacidosis without coma: Secondary | ICD-10-CM | POA: Diagnosis not present

## 2021-11-23 DIAGNOSIS — I4891 Unspecified atrial fibrillation: Secondary | ICD-10-CM | POA: Diagnosis not present

## 2021-11-23 DIAGNOSIS — K121 Other forms of stomatitis: Secondary | ICD-10-CM | POA: Diagnosis present

## 2021-11-23 DIAGNOSIS — I471 Supraventricular tachycardia: Secondary | ICD-10-CM | POA: Diagnosis present

## 2021-11-23 DIAGNOSIS — E785 Hyperlipidemia, unspecified: Secondary | ICD-10-CM | POA: Diagnosis not present

## 2021-11-23 DIAGNOSIS — E860 Dehydration: Secondary | ICD-10-CM | POA: Diagnosis present

## 2021-11-23 DIAGNOSIS — R0600 Dyspnea, unspecified: Secondary | ICD-10-CM | POA: Diagnosis not present

## 2021-11-23 DIAGNOSIS — E1111 Type 2 diabetes mellitus with ketoacidosis with coma: Secondary | ICD-10-CM | POA: Diagnosis not present

## 2021-11-23 DIAGNOSIS — R109 Unspecified abdominal pain: Secondary | ICD-10-CM | POA: Diagnosis not present

## 2021-11-23 DIAGNOSIS — R1110 Vomiting, unspecified: Secondary | ICD-10-CM | POA: Diagnosis not present

## 2021-11-23 DIAGNOSIS — E1311 Other specified diabetes mellitus with ketoacidosis with coma: Secondary | ICD-10-CM | POA: Diagnosis not present

## 2021-11-23 HISTORY — DX: Gastro-esophageal reflux disease without esophagitis: K21.9

## 2021-11-23 LAB — GLUCOSE, CAPILLARY
Glucose-Capillary: 217 mg/dL — ABNORMAL HIGH (ref 70–99)
Glucose-Capillary: 217 mg/dL — ABNORMAL HIGH (ref 70–99)
Glucose-Capillary: 228 mg/dL — ABNORMAL HIGH (ref 70–99)
Glucose-Capillary: 314 mg/dL — ABNORMAL HIGH (ref 70–99)
Glucose-Capillary: 385 mg/dL — ABNORMAL HIGH (ref 70–99)
Glucose-Capillary: 419 mg/dL — ABNORMAL HIGH (ref 70–99)
Glucose-Capillary: 427 mg/dL — ABNORMAL HIGH (ref 70–99)
Glucose-Capillary: 488 mg/dL — ABNORMAL HIGH (ref 70–99)
Glucose-Capillary: 490 mg/dL — ABNORMAL HIGH (ref 70–99)
Glucose-Capillary: 507 mg/dL (ref 70–99)
Glucose-Capillary: 570 mg/dL (ref 70–99)
Glucose-Capillary: >600 mg/dL (ref 70–99)
Glucose-Capillary: >600 mg/dL (ref 70–99)

## 2021-11-23 LAB — URINALYSIS, ROUTINE W REFLEX MICROSCOPIC
Bacteria, UA: NONE SEEN
Bilirubin Urine: NEGATIVE
Glucose, UA: >=500 mg/dL — AB
Ketones, ur: 80 mg/dL — AB
Leukocytes,Ua: NEGATIVE
Nitrite: NEGATIVE
Protein, ur: NEGATIVE mg/dL
Specific Gravity, Urine: 1.021 (ref 1.005–1.030)
pH: 5 (ref 5.0–8.0)

## 2021-11-23 LAB — BASIC METABOLIC PANEL
Anion gap: 36 — ABNORMAL HIGH (ref 5–15)
Anion gap: 31 — ABNORMAL HIGH (ref 5–15)
Anion gap: 16 — ABNORMAL HIGH (ref 5–15)
BUN: 59 mg/dL — ABNORMAL HIGH (ref 6–20)
BUN: 55 mg/dL — ABNORMAL HIGH (ref 6–20)
BUN: 39 mg/dL — ABNORMAL HIGH (ref 6–20)
CO2: 10 mmol/L — ABNORMAL LOW (ref 22–32)
CO2: 10 mmol/L — ABNORMAL LOW (ref 22–32)
CO2: 19 mmol/L — ABNORMAL LOW (ref 22–32)
Calcium: 9.7 mg/dL (ref 8.9–10.3)
Calcium: 9.2 mg/dL (ref 8.9–10.3)
Calcium: 9.4 mg/dL (ref 8.9–10.3)
Chloride: 78 mmol/L — ABNORMAL LOW (ref 98–111)
Chloride: 90 mmol/L — ABNORMAL LOW (ref 98–111)
Chloride: 105 mmol/L (ref 98–111)
Creatinine, Ser: 3.53 mg/dL — ABNORMAL HIGH (ref 0.61–1.24)
Creatinine, Ser: 2.8 mg/dL — ABNORMAL HIGH (ref 0.61–1.24)
Creatinine, Ser: 1.59 mg/dL — ABNORMAL HIGH (ref 0.61–1.24)
GFR, Estimated: 19 mL/min — ABNORMAL LOW (ref >60)
GFR, Estimated: 25 mL/min — ABNORMAL LOW (ref >60)
GFR, Estimated: 49 mL/min — ABNORMAL LOW (ref >60)
Glucose, Bld: 1134 mg/dL (ref 70–99)
Glucose, Bld: 865 mg/dL (ref 70–99)
Glucose, Bld: 210 mg/dL — ABNORMAL HIGH (ref 70–99)
Potassium: 5.9 mmol/L — ABNORMAL HIGH (ref 3.5–5.1)
Potassium: 4.8 mmol/L (ref 3.5–5.1)
Potassium: 4 mmol/L (ref 3.5–5.1)
Sodium: 124 mmol/L — ABNORMAL LOW (ref 135–145)
Sodium: 131 mmol/L — ABNORMAL LOW (ref 135–145)
Sodium: 140 mmol/L (ref 135–145)

## 2021-11-23 LAB — CBC
HCT: 42.3 % (ref 39.0–52.0)
Hemoglobin: 14.5 g/dL (ref 13.0–17.0)
MCH: 30.9 pg (ref 26.0–34.0)
MCHC: 34.3 g/dL (ref 30.0–36.0)
MCV: 90.2 fL (ref 80.0–100.0)
Platelets: 224 10*3/uL (ref 150–400)
RBC: 4.69 MIL/uL (ref 4.22–5.81)
RDW: 12.5 % (ref 11.5–15.5)
WBC: 21.2 10*3/uL — ABNORMAL HIGH (ref 4.0–10.5)
nRBC: 0 % (ref 0.0–0.2)

## 2021-11-23 LAB — BLOOD GAS, VENOUS
Acid-base deficit: 14.9 mmol/L — ABNORMAL HIGH (ref 0.0–2.0)
Bicarbonate: 10.7 mmol/L — ABNORMAL LOW (ref 20.0–28.0)
Drawn by: 13543
O2 Saturation: 70.2 %
Patient temperature: 35.9
pCO2, Ven: 24 mmHg — ABNORMAL LOW (ref 44–60)
pH, Ven: 7.26 (ref 7.25–7.43)
pO2, Ven: 43 mmHg (ref 32–45)

## 2021-11-23 LAB — BETA-HYDROXYBUTYRIC ACID
Beta-Hydroxybutyric Acid: >8 mmol/L — ABNORMAL HIGH (ref 0.05–0.27)
Beta-Hydroxybutyric Acid: 3.82 mmol/L — ABNORMAL HIGH (ref 0.05–0.27)

## 2021-11-23 LAB — RESP PANEL BY RT-PCR (FLU A&B, COVID) ARPGX2
Influenza A by PCR: NEGATIVE
Influenza B by PCR: NEGATIVE
SARS Coronavirus 2 by RT PCR: NEGATIVE

## 2021-11-23 LAB — LIPASE, BLOOD: Lipase: 22 U/L (ref 11–51)

## 2021-11-23 LAB — CBG MONITORING, ED
Glucose-Capillary: >600 mg/dL (ref 70–99)
Glucose-Capillary: >600 mg/dL (ref 70–99)

## 2021-11-23 MED ORDER — LABETALOL HCL 5 MG/ML IV SOLN
10.0000 mg | INTRAVENOUS | Status: DC | PRN
  Administered 2021-11-24: 10 mg via INTRAVENOUS
  Filled 2021-11-23: qty 4

## 2021-11-23 MED ORDER — MORPHINE SULFATE (PF) 4 MG/ML IV SOLN
4.0000 mg | Freq: Once | INTRAVENOUS | Status: AC
  Administered 2021-11-24: 4 mg via INTRAVENOUS
  Filled 2021-11-23: qty 1

## 2021-11-23 MED ORDER — METOPROLOL SUCCINATE ER 50 MG PO TB24: 100.0000 mg | ORAL_TABLET | Freq: Every day | ORAL | Status: DC

## 2021-11-23 MED ORDER — INSULIN REGULAR(HUMAN) IN NACL 100-0.9 UT/100ML-% IV SOLN
INTRAVENOUS | Status: AC
  Administered 2021-11-23: 11 [IU]/h via INTRAVENOUS
  Administered 2021-11-23: 12 [IU]/h via INTRAVENOUS
  Filled 2021-11-23 (×2): qty 100

## 2021-11-23 MED ORDER — OXYCODONE-ACETAMINOPHEN 10-325 MG PO TABS: 1.0000 | ORAL_TABLET | Freq: Three times a day (TID) | ORAL | Status: DC | PRN

## 2021-11-23 MED ORDER — METOPROLOL SUCCINATE ER 50 MG PO TB24
100.0000 mg | ORAL_TABLET | Freq: Every day | ORAL | Status: DC
  Administered 2021-11-23 – 2021-11-26 (×4): 100 mg via ORAL
  Filled 2021-11-23 (×4): qty 2

## 2021-11-23 MED ORDER — SODIUM CHLORIDE 0.9% FLUSH
3.0000 mL | Freq: Two times a day (BID) | INTRAVENOUS | Status: DC
  Administered 2021-11-23: 3 mL via INTRAVENOUS

## 2021-11-23 MED ORDER — ASPIRIN 81 MG PO TBEC
81.0000 mg | DELAYED_RELEASE_TABLET | Freq: Every day | ORAL | Status: DC
  Administered 2021-11-24 – 2021-11-26 (×3): 81 mg via ORAL
  Filled 2021-11-23 (×3): qty 1

## 2021-11-23 MED ORDER — ACETAMINOPHEN 325 MG PO TABS: 650.0000 mg | ORAL_TABLET | Freq: Four times a day (QID) | ORAL | Status: DC | PRN

## 2021-11-23 MED ORDER — LACTATED RINGERS IV BOLUS
1000.0000 mL | Freq: Once | INTRAVENOUS | Status: AC
  Administered 2021-11-23: 1000 mL via INTRAVENOUS

## 2021-11-23 MED ORDER — LACTATED RINGERS IV SOLN: INTRAVENOUS | Status: DC

## 2021-11-23 MED ORDER — ROSUVASTATIN CALCIUM 20 MG PO TABS
40.0000 mg | ORAL_TABLET | Freq: Every day | ORAL | Status: DC
  Administered 2021-11-24 – 2021-11-26 (×3): 40 mg via ORAL
  Filled 2021-11-23 (×3): qty 2

## 2021-11-23 MED ORDER — PANTOPRAZOLE SODIUM 40 MG PO TBEC
40.0000 mg | DELAYED_RELEASE_TABLET | Freq: Every day | ORAL | Status: DC
  Administered 2021-11-23 – 2021-11-25 (×3): 40 mg via ORAL
  Filled 2021-11-23 (×3): qty 1

## 2021-11-23 MED ORDER — ONDANSETRON HCL 4 MG/2ML IJ SOLN
4.0000 mg | Freq: Four times a day (QID) | INTRAMUSCULAR | Status: DC | PRN
  Administered 2021-11-23 – 2021-11-26 (×3): 4 mg via INTRAVENOUS
  Filled 2021-11-23 (×3): qty 2

## 2021-11-23 MED ORDER — METOCLOPRAMIDE HCL 5 MG/ML IJ SOLN
10.0000 mg | Freq: Once | INTRAMUSCULAR | Status: AC
  Administered 2021-11-23: 10 mg via INTRAVENOUS
  Filled 2021-11-23: qty 2

## 2021-11-23 MED ORDER — BISACODYL 10 MG RE SUPP: 10.0000 mg | Freq: Every day | RECTAL | Status: DC | PRN

## 2021-11-23 MED ORDER — DEXTROSE 50 % IV SOLN: 0.0000 mL | INTRAVENOUS | Status: DC | PRN

## 2021-11-23 MED ORDER — SODIUM CHLORIDE 0.9 % IV BOLUS
1000.0000 mL | Freq: Once | INTRAVENOUS | Status: AC
  Administered 2021-11-23: 1000 mL via INTRAVENOUS

## 2021-11-23 MED ORDER — SODIUM CHLORIDE 0.9% FLUSH: 3.0000 mL | INTRAVENOUS | Status: DC | PRN

## 2021-11-23 MED ORDER — SODIUM CHLORIDE 0.9 % IV SOLN: INTRAVENOUS | Status: DC | PRN

## 2021-11-23 MED ORDER — ACETAMINOPHEN 650 MG RE SUPP: 650.0000 mg | Freq: Four times a day (QID) | RECTAL | Status: DC | PRN

## 2021-11-23 MED ORDER — POLYETHYLENE GLYCOL 3350 17 G PO PACK: 17.0000 g | PACK | Freq: Every day | ORAL | Status: DC | PRN

## 2021-11-23 MED ORDER — AMLODIPINE BESYLATE 5 MG PO TABS
10.0000 mg | ORAL_TABLET | Freq: Every day | ORAL | Status: DC
  Administered 2021-11-24 – 2021-11-26 (×3): 10 mg via ORAL
  Filled 2021-11-23 (×3): qty 2

## 2021-11-23 MED ORDER — OXYCODONE-ACETAMINOPHEN 5-325 MG PO TABS
1.0000 | ORAL_TABLET | Freq: Three times a day (TID) | ORAL | Status: DC | PRN
  Administered 2021-11-23 – 2021-11-26 (×5): 1 via ORAL
  Filled 2021-11-23 (×5): qty 1

## 2021-11-23 MED ORDER — PROCHLORPERAZINE EDISYLATE 10 MG/2ML IJ SOLN
10.0000 mg | Freq: Once | INTRAMUSCULAR | Status: AC
  Administered 2021-11-24: 10 mg via INTRAVENOUS
  Filled 2021-11-23: qty 2

## 2021-11-23 MED ORDER — OXYCODONE HCL 5 MG PO TABS
5.0000 mg | ORAL_TABLET | Freq: Three times a day (TID) | ORAL | Status: DC | PRN
  Administered 2021-11-23 – 2021-11-26 (×6): 5 mg via ORAL
  Filled 2021-11-23 (×6): qty 1

## 2021-11-23 MED ORDER — SODIUM CHLORIDE 0.9% FLUSH
3.0000 mL | Freq: Two times a day (BID) | INTRAVENOUS | Status: DC
  Administered 2021-11-23 – 2021-11-24 (×2): 3 mL via INTRAVENOUS

## 2021-11-23 MED ORDER — ONDANSETRON HCL 4 MG PO TABS
4.0000 mg | ORAL_TABLET | Freq: Four times a day (QID) | ORAL | Status: DC | PRN
  Administered 2021-11-24: 4 mg via ORAL
  Filled 2021-11-23: qty 1

## 2021-11-23 MED ORDER — DEXTROSE IN LACTATED RINGERS 5 % IV SOLN: INTRAVENOUS | Status: AC

## 2021-11-23 MED ORDER — IRBESARTAN 75 MG PO TABS: 75.0000 mg | ORAL_TABLET | Freq: Every day | ORAL | Status: DC

## 2021-11-23 MED ORDER — ENOXAPARIN SODIUM 40 MG/0.4ML IJ SOSY
40.0000 mg | PREFILLED_SYRINGE | INTRAMUSCULAR | Status: DC
  Administered 2021-11-23 – 2021-11-25 (×3): 40 mg via SUBCUTANEOUS
  Filled 2021-11-23 (×3): qty 0.4

## 2021-11-23 MED ORDER — ADULT MULTIVITAMIN W/MINERALS CH
1.0000 | ORAL_TABLET | Freq: Every day | ORAL | Status: DC
  Administered 2021-11-24 – 2021-11-26 (×3): 1 via ORAL
  Filled 2021-11-23 (×3): qty 1

## 2021-11-23 NOTE — Telephone Encounter (Signed)
Pt's wife called and stated that Terry [name] increased his Trulicity at his last visit which was July 22, he started the increase 2 weeks ago. Ever since this, his lips have doubled in size (swollen) and the last 2 days he has been vomiting. Could this be from the Trulicity. Please advise?

## 2021-11-23 NOTE — Telephone Encounter (Signed)
Pt currently admitted for DKA. Advised wife to call for a f/u

## 2021-11-23 NOTE — H&P (Signed)
Patient Demographics:    Terry [name], is a 61 y.o. male  MRN: [id number]   DOB - Nov 12, 1960  Admit Date - 11/23/2021  Outpatient Primary MD for the patient is Kathyrn Drown, MD   Assessment & Plan:   Assessment and Plan:  1)DKA--patient met DKA criteria on admission with a bicarb of 10, anion gap of 36, serum glucose of 1,134 and beta hydroxybutyric acid of >>8.0 --UA with glucosuria and ketonuria but no evidence of UTI -Treat with aggressive IV fluids, IV insulin, glucose check, frequent BMP and beta hydroxybutyric acid checks per Endo tool protocol -May switch from LR/NS to D5 solution when glucose drops below 250, per Endo tool protocol --Currently patient's symptoms coincide with attempt to aggressively titrate up his Trulicity--  He went from 1.5-4.5 in the span of 2 weeks. -He has lost about 20 pounds  2)AKI----acute kidney injury-due to severe dehydration in the setting of DKA --creatinine was up to 3.53 from a baseline usually around 1.4-1.5 -BUN is 59 -hold Avapro until renal function improves -- renally adjust medications, avoid nephrotoxic agents / dehydration  / hypotension  3)Patient with prior history of Transient A. fib with RVR/SVT -Patient continues to have tachycardia in the setting of dehydration and AKI -Resume Toprol-XL -CHADSVASC score 2 (HTN and DM)  --PTA was on aspirin, continue same -Echo with EF of 65 to 70%  4)Leukocytosis-WBC 21.2.Marland KitchenMarland Kitchen Suspect this is reactive in setting of dehydration and DKA and intractable emesis -Chest x-ray and abdominal x-rays without acute findings  5)Intractable emesis--- suspect due to DKA -UA without evidence of infection chest x-ray abdominal x-rays without acute findings -Lipase is not elevated  6)Hyponatremia- Sodium 124, when corrected for  elevated glucose sodium is actually normal  7)HTN--restart amlodipine and metoprolol, hold Avapro until renal function improves  8)GERD--Protonix   Disposition/Need for in-Hospital Stay- patient unable to be discharged at this time due to - -patient will DKA with AKI requiring IV fluids and IV insulin  Status is: Inpatient  Remains inpatient appropriate because:   Dispo: The patient is from: Home              Anticipated d/c is to: Home              Anticipated d/c date is: 2 days              Patient currently is not medically stable to d/c. Barriers: Not Clinically Stable-    With History of - Reviewed by me  Past Medical History:  Diagnosis Date   Diabetes mellitus without complication ([place]) 1594   Hyperlipidemia    Hypertension    Osteoarthritis       Past Surgical History:  Procedure Laterality Date   ANKLE SURGERY Left 03/28/2005   2 plates/screws   COLONOSCOPY N/A 08/04/2017   Procedure: COLONOSCOPY;  Surgeon: Rogene Houston, MD;  [facility];  Service: Endoscopy;  Laterality: N/A;  ESOPHAGEAL DILATION N/A 08/04/2017   Procedure: ESOPHAGEAL DILATION;  Surgeon: Rogene Houston, MD;  [facility];  Service: Endoscopy;  Laterality: N/A;   ESOPHAGOGASTRODUODENOSCOPY N/A 08/04/2017   Procedure: ESOPHAGOGASTRODUODENOSCOPY (EGD);  Surgeon: Rogene Houston, MD;  [facility];  Service: Endoscopy;  Laterality: N/A;   POLYPECTOMY  08/04/2017   Procedure: POLYPECTOMY;  Surgeon: Rogene Houston, MD;  [facility]: [facility];  Service: Endoscopy;;   ROOT CANAL        Chief Complaint  Patient presents with   Hyperglycemia      HPI:    Terry [name]  is a 61 y.o. male with history of diabetes mellitus type 2, HTN, HLD, history of transient A-fib/SVT in the setting of DKA and COVID infection previously and GERD who presents to the ED with several days of fatigue malaise and generalized weakness -Over the last couple days patient  has had intractable emesis as well -Emesis was without bile or blood- -additional history obtained from patient's wife at bedside -Currently patient's symptoms coincide with attempt to aggressively titrate up his Trulicity--  He went from 1.5-4.5 in the span of 2 weeks. -He has lost about 20 pounds -No chest pains no palpitations no dizziness he just feels very very weak tired -No diarrhea -No sick contacts at home No fever  Or chills  -Abdominal x-rays without acute findings, specifically no ileus or obstruction noted -Chest x-ray without acute findings Blood sugar on admission was 1134, bicarb of 10, anion gap 36, potassium was 5.9,  -BUN 59 -Sodium 124, when corrected for elevated glucose sodium is actually normal -Chloride is 78 -creatinine was up to 3.53 from a baseline usually around 1.4-1.5 -WBC 21.2 hemoglobin 14.5 platelets 224 -Beta hydroxybutyric acid is greater than 8.0 -UA with glucosuria and ketonuria but no evidence of UTI -Patient was started on IV insulin and IV fluids   Review of systems:    In addition to the HPI above,   A full Review of  Systems was done, all other systems reviewed are negative except as noted above in HPI , .    Social History:  Reviewed by me    Social History   Tobacco Use   Smoking status: Former    Types: Cigarettes    Quit date: 06/15/1992    Years since quitting: 29.4   Smokeless tobacco: Never  Substance Use Topics   Alcohol use: Never       Family History :  Reviewed by me    Family History  Problem Relation Age of Onset   Hypertension Mother    Diabetes Mother    Heart disease Mother    Hyperlipidemia Mother    Cancer Father        lung     Home Medications:   Prior to Admission medications   Medication Sig Start Date End Date Taking? Authorizing Provider  amLODipine (NORVASC) 10 MG tablet Take 1 tablet (10 mg total) by mouth daily. 11/05/21  Yes Strader, [place] M, PA-C  ascorbic acid (VITAMIN C) 500 MG  tablet Take 1 tablet (500 mg total) by mouth daily. 04/11/19  Yes Roxan Hockey, MD  aspirin EC 81 MG tablet Take 1 tablet (81 mg total) by mouth daily with breakfast. 04/10/19  Yes Windi Toro, MD  Dulaglutide (TRULICITY) 4.5 ZD/6.6YQ SOPN Inject 4.5 mg as directed once a week. 10/15/21  Yes Reardon, Juanetta Beets, NP  fenofibrate 160 MG tablet TAKE 1 TABLET BY MOUTH ONCE A  DAY. 11/10/21  Yes Nida, Marella Chimes, MD  indapamide (LOZOL) 2.5 MG tablet TAKE (1) TABLET BY MOUTH ONCE DAILY. 07/16/21  Yes Luking, Elayne Snare, MD  insulin regular human CONCENTRATED (HUMULIN R U-500 KWIKPEN) 500 UNIT/ML KwikPen Inject 60 Units into the skin 3 (three) times daily with meals. 10/15/21  Yes Reardon, Juanetta Beets, NP  metoprolol succinate (TOPROL-XL) 100 MG 24 hr tablet TAKE (1) TABLET BY MOUTH ONCE DAILY. 07/16/21  Yes Kathyrn Drown, MD  Multiple Vitamin (MULTIVITAMIN) tablet Take 1 tablet by mouth daily.   Yes [provider]  oxyCODONE-acetaminophen (PERCOCET) 10-325 MG tablet 1 taken 4 times daily as needed for pain 10/22/21  Yes Luking, Scott A, MD  pantoprazole (PROTONIX) 20 MG tablet Take 20 mg by mouth daily.   Yes [provider]  rosuvastatin (CRESTOR) 40 MG tablet Take 1 tablet (40 mg total) by mouth daily. 07/16/21  Yes Luking, Elayne Snare, MD  sildenafil (REVATIO) 20 MG tablet Take 1-5 tablets before sexual activity as directed. No greater that 5 tablets in a day. 01/06/17  Yes Mikey Kirschner, MD  triamcinolone cream (KENALOG) 0.1 % Apply thin amount to top inner lip twice daily for 10 to 14 days till healed 10/22/21  Yes Luking, Elayne Snare, MD  valsartan (DIOVAN) 160 MG tablet Take 1 tablet (160 mg total) by mouth daily. 07/16/21  Yes Luking, Elayne Snare, MD  Continuous Blood Gluc Sensor (DEXCOM G6 SENSOR) MISC APPLY 1 SENSOR EVERY 10 DAYS FOR GLUCOSE MONITORING. 10/27/21   Brita Romp, NP  Continuous Blood Gluc Transmit (DEXCOM G6 TRANSMITTER) MISC USE AS DIRECTED. 03/26/21   Reardon, Loree Fee  J, NP  mometasone (ELOCON) 0.1 % cream APPLY TO AFFECTED AREAS TWICE DAILY AS NEEDED. Patient not taking: Reported on 11/23/2021 04/15/19   Kathyrn Drown, MD  oxyCODONE-acetaminophen (PERCOCET) 10-325 MG tablet TAKE ONE TABLET BY MOUTH (4) TIMES DAILY AS NEEDED FOR PAIN. Patient not taking: Reported on 11/23/2021 10/22/21   Kathyrn Drown, MD  oxyCODONE-acetaminophen (PERCOCET) 10-325 MG tablet 1 taken 4 times daily as needed for pain Patient not taking: Reported on 11/23/2021 10/22/21   Kathyrn Drown, MD  TECHLITE PEN NEEDLES 32G X 4 MM MISC INJECT 4 TIMES DAILY AS DIRECTED. 06/12/20   Kathyrn Drown, MD  TRULICITY 1.5 NW/2.9FA SOPN Inject 1.5 mg into the skin once a week. Patient not taking: Reported on 11/23/2021 11/01/21   Brita Romp, NP  zinc sulfate 220 (50 Zn) MG capsule Take 1 capsule (220 mg total) by mouth daily. Patient not taking: Reported on 11/23/2021 04/11/19   Roxan Hockey, MD     Allergies:     Allergies  Allergen Reactions   Invokana [Canagliflozin] Other (See Comments)    laryngitis   Altace [Ramipril] Cough   Metformin And Related Diarrhea     Physical Exam:   Vitals  Blood pressure (!) 158/66, pulse (!) 119, temperature 98.1 F (36.7 C), temperature source Axillary, resp. rate (!) 29, height $RemoveBe'5\' 10"'gRlzAcglL$  (1.778 m), weight 86.2 kg, SpO2 100 %.  Physical Examination: General appearance - alert,  in no distress  Mental status - alert, oriented to person, place, and time,  Eyes - sclera anicteric Mouth-very dry oral mucosa Neck - supple, no JVD elevation , Chest - clear  to auscultation bilaterally, symmetrical air movement,  Heart - S1 and S2 normal, regular  Abdomen - soft, nontender, nondistended, +BS Neurological - screening mental status exam normal, neck supple without rigidity, cranial  nerves II through XII intact, DTR's normal and symmetric Extremities - no pedal edema noted, intact peripheral pulses  Skin - warm, very dry   Data Review:     CBC Recent Labs  Lab 11/23/21 1117  WBC 21.2*  HGB 14.5  HCT 42.3  PLT 224  MCV 90.2  MCH 30.9  MCHC 34.3  RDW 12.5   ------------------------------------------------------------------------------------------------------------------  Chemistries  Recent Labs  Lab 11/23/21 1117 11/23/21 1440  NA 124* 131*  K 5.9* 4.8  CL 78* 90*  CO2 10* 10*  GLUCOSE 1,134* 865*  BUN 59* 55*  CREATININE 3.53* 2.80*  CALCIUM 9.7 9.2   ------------------------------------------------------------------------------------------------------------------ estimated creatinine clearance is 29 mL/min (A) (by C-G formula based on SCr of 2.8 mg/dL (H)). ---------------------------------------------------------------------------------------------------------------  Urinalysis    Component Value Date/Time   COLORURINE STRAW (A) 11/23/2021 1354   APPEARANCEUR CLEAR 11/23/2021 1354   LABSPEC 1.021 11/23/2021 1354   PHURINE 5.0 11/23/2021 1354   GLUCOSEU >=500 (A) 11/23/2021 1354   HGBUR LARGE (A) 11/23/2021 1354   BILIRUBINUR NEGATIVE 11/23/2021 1354   KETONESUR 80 (A) 11/23/2021 1354   PROTEINUR NEGATIVE 11/23/2021 1354   NITRITE NEGATIVE 11/23/2021 1354   LEUKOCYTESUR NEGATIVE 11/23/2021 1354     Imaging Results:    DG Abd 2 Views  Result Date: 11/23/2021 CLINICAL DATA:  Vomiting and abdominal pain. EXAM: ABDOMEN - 2 VIEW COMPARISON:  None Available. FINDINGS: Overall bowel gas pattern is nonobstructive. No evidence of soft tissue mass or abnormal fluid collection. Evidence of free intraperitoneal air. No evidence of renal or ureteral stone is seen. No acute-appearing osseous abnormality. Lung bases appear clear. IMPRESSION: Negative.  Nonobstructive bowel gas pattern. Electronically Signed   By: Franki Cabot M.D.   On: 11/23/2021 16:39   DG Chest Portable 1 View  Result Date: 11/23/2021 CLINICAL DATA:  Vomiting, dyspnea. EXAM: PORTABLE CHEST 1 VIEW COMPARISON:  April 06, 2019. FINDINGS:  The heart size and mediastinal contours are within normal limits. Both lungs are clear. The visualized skeletal structures are unremarkable. IMPRESSION: No active disease. Electronically Signed   By: Marijo Conception M.D.   On: 11/23/2021 11:47    Radiological Exams on Admission: DG Abd 2 Views  Result Date: 11/23/2021 CLINICAL DATA:  Vomiting and abdominal pain. EXAM: ABDOMEN - 2 VIEW COMPARISON:  None Available. FINDINGS: Overall bowel gas pattern is nonobstructive. No evidence of soft tissue mass or abnormal fluid collection. Evidence of free intraperitoneal air. No evidence of renal or ureteral stone is seen. No acute-appearing osseous abnormality. Lung bases appear clear. IMPRESSION: Negative.  Nonobstructive bowel gas pattern. Electronically Signed   By: Franki Cabot M.D.   On: 11/23/2021 16:39   DG Chest Portable 1 View  Result Date: 11/23/2021 CLINICAL DATA:  Vomiting, dyspnea. EXAM: PORTABLE CHEST 1 VIEW COMPARISON:  April 06, 2019. FINDINGS: The heart size and mediastinal contours are within normal limits. Both lungs are clear. The visualized skeletal structures are unremarkable. IMPRESSION: No active disease. Electronically Signed   By: Marijo Conception M.D.   On: 11/23/2021 11:47    DVT Prophylaxis -SCD /lovenox AM Labs Ordered, also please review Full Orders  Family Communication: Admission, patients condition and plan of care including tests being ordered have been discussed with the patient and wife at bedside who indicate understanding and agree with the plan   Condition  -stable   Roxan Hockey M.D on 11/23/2021 at 7:30 PM Go to [email or web address] -  for contact info  [facility] - Office  [phone]

## 2021-11-23 NOTE — ED Provider Notes (Signed)
[facility] EMERGENCY DEPARTMENT Provider Note   CSN: [id number] Arrival date & time: 11/23/21  1016     History  Chief Complaint  Patient presents with   Hyperglycemia    Terry [name] is a 61 y.o. male.  HPI 61 year old male with a history of type 2 diabetes presents with lip swelling and vomiting.  He has been having lip swelling for about a day or so.  His lips have been bothering him for about a week and have been dry and cracked.  However over the last 24 hours they have been much more swollen.  He is wondering if this is related to Trulicity.  He had his dose tripled a couple weeks ago by endocrinology.  He finished off the medicine he had and then got the new increased dosage form.  He states has been vomiting over the last 2 days.  Some cough when he is vomiting but otherwise no cough.  No fevers.  His mouth feels very dry.  Home Medications Prior to Admission medications   Medication Sig Start Date End Date Taking? Authorizing Provider  amLODipine (NORVASC) 10 MG tablet Take 1 tablet (10 mg total) by mouth daily. 11/05/21  Yes Strader, [place] M, PA-C  ascorbic acid (VITAMIN C) 500 MG tablet Take 1 tablet (500 mg total) by mouth daily. 04/11/19  Yes Roxan Hockey, MD  aspirin EC 81 MG tablet Take 1 tablet (81 mg total) by mouth daily with breakfast. 04/10/19  Yes Emokpae, Courage, MD  Dulaglutide (TRULICITY) 4.5 UD/1.4HF SOPN Inject 4.5 mg as directed once a week. 10/15/21  Yes Reardon, Juanetta Beets, NP  fenofibrate 160 MG tablet TAKE 1 TABLET BY MOUTH ONCE A DAY. 11/10/21  Yes Nida, Marella Chimes, MD  indapamide (LOZOL) 2.5 MG tablet TAKE (1) TABLET BY MOUTH ONCE DAILY. 07/16/21  Yes Luking, Elayne Snare, MD  insulin regular human CONCENTRATED (HUMULIN R U-500 KWIKPEN) 500 UNIT/ML KwikPen Inject 60 Units into the skin 3 (three) times daily with meals. 10/15/21  Yes Reardon, Juanetta Beets, NP  metoprolol succinate (TOPROL-XL) 100 MG 24 hr tablet TAKE (1) TABLET BY MOUTH ONCE DAILY.  07/16/21  Yes Kathyrn Drown, MD  Multiple Vitamin (MULTIVITAMIN) tablet Take 1 tablet by mouth daily.   Yes [provider]  oxyCODONE-acetaminophen (PERCOCET) 10-325 MG tablet 1 taken 4 times daily as needed for pain 10/22/21  Yes Luking, Lovelle Lema A, MD  pantoprazole (PROTONIX) 20 MG tablet Take 20 mg by mouth daily.   Yes [provider]  rosuvastatin (CRESTOR) 40 MG tablet Take 1 tablet (40 mg total) by mouth daily. 07/16/21  Yes Luking, Elayne Snare, MD  sildenafil (REVATIO) 20 MG tablet Take 1-5 tablets before sexual activity as directed. No greater that 5 tablets in a day. 01/06/17  Yes Mikey Kirschner, MD  triamcinolone cream (KENALOG) 0.1 % Apply thin amount to top inner lip twice daily for 10 to 14 days till healed 10/22/21  Yes Luking, Elayne Snare, MD  valsartan (DIOVAN) 160 MG tablet Take 1 tablet (160 mg total) by mouth daily. 07/16/21  Yes Luking, Elayne Snare, MD  Continuous Blood Gluc Sensor (DEXCOM G6 SENSOR) MISC APPLY 1 SENSOR EVERY 10 DAYS FOR GLUCOSE MONITORING. 10/27/21   Brita Romp, NP  Continuous Blood Gluc Transmit (DEXCOM G6 TRANSMITTER) MISC USE AS DIRECTED. 03/26/21   Reardon, Loree Fee J, NP  mometasone (ELOCON) 0.1 % cream APPLY TO AFFECTED AREAS TWICE DAILY AS NEEDED. Patient not taking: Reported on 11/23/2021 04/15/19  Kathyrn Drown, MD  oxyCODONE-acetaminophen (PERCOCET) 10-325 MG tablet TAKE ONE TABLET BY MOUTH (4) TIMES DAILY AS NEEDED FOR PAIN. Patient not taking: Reported on 11/23/2021 10/22/21   Kathyrn Drown, MD  oxyCODONE-acetaminophen (PERCOCET) 10-325 MG tablet 1 taken 4 times daily as needed for pain Patient not taking: Reported on 11/23/2021 10/22/21   Kathyrn Drown, MD  TECHLITE PEN NEEDLES 32G X 4 MM MISC INJECT 4 TIMES DAILY AS DIRECTED. 06/12/20   Kathyrn Drown, MD  TRULICITY 1.5 IR/4.8NI SOPN Inject 1.5 mg into the skin once a week. Patient not taking: Reported on 11/23/2021 11/01/21   Brita Romp, NP  zinc sulfate 220 (50 Zn) MG capsule  Take 1 capsule (220 mg total) by mouth daily. Patient not taking: Reported on 11/23/2021 04/11/19   Roxan Hockey, MD      Allergies    Invokana [canagliflozin], Altace [ramipril], and Metformin and related    Review of Systems   Review of Systems  Constitutional:  Negative for fever.  HENT:  Positive for facial swelling. Negative for sore throat and trouble swallowing.   Respiratory:  Negative for shortness of breath.   Cardiovascular:  Negative for chest pain.  Gastrointestinal:  Positive for vomiting. Negative for abdominal pain.    Physical Exam Updated Vital Signs BP 125/79   Pulse (!) 149   Temp 97.7 F (36.5 C) (Oral)   Resp (!) 22   Ht '5\' 10"'$  (1.778 m)   Wt 86.2 kg   SpO2 98%   BMI 27.26 kg/m  Physical Exam Vitals and nursing note reviewed.  Constitutional:      Appearance: He is well-developed. He is ill-appearing.  HENT:     Head: Normocephalic and atraumatic.     Mouth/Throat:     Mouth: Mucous membranes are dry.     Pharynx: No pharyngeal swelling, oropharyngeal exudate, posterior oropharyngeal erythema or uvula swelling.     Comments: No intraoral swelling.  No floor of the mouth swelling. Left lower lip has some slight swelling and mild tenderness with some cracking.  The left upper lip has a little more swelling, especially right near the midline but no obvious abscess.  It is tender however.  No erythema or fluctuance. The rest of the face does not have any swelling or tenderness Cardiovascular:     Rate and Rhythm: Regular rhythm. Tachycardia present.     Heart sounds: Normal heart sounds.  Pulmonary:     Effort: Pulmonary effort is normal. Tachypnea present.     Breath sounds: Normal breath sounds.  Abdominal:     Palpations: Abdomen is soft.     Tenderness: There is no abdominal tenderness.  Skin:    General: Skin is warm and dry.  Neurological:     Mental Status: He is alert.     ED Results / Procedures / Treatments   Labs (all labs  ordered are listed, but only abnormal results are displayed) Labs Reviewed  BASIC METABOLIC PANEL - Abnormal; Notable for the following components:      Result Value   Sodium 124 (*)    Potassium 5.9 (*)    Chloride 78 (*)    CO2 10 (*)    Glucose, Bld 1,134 (*)    BUN 59 (*)    Creatinine, Ser 3.53 (*)    GFR, Estimated 19 (*)    Anion gap 36 (*)    All other components within normal limits  CBC - Abnormal; Notable for the  following components:   WBC 21.2 (*)    All other components within normal limits  URINALYSIS, ROUTINE W REFLEX MICROSCOPIC - Abnormal; Notable for the following components:   Color, Urine STRAW (*)    Glucose, UA >=500 (*)    Hgb urine dipstick LARGE (*)    Ketones, ur 80 (*)    All other components within normal limits  BLOOD GAS, VENOUS - Abnormal; Notable for the following components:   pCO2, Ven 24 (*)    Bicarbonate 10.7 (*)    Acid-base deficit 14.9 (*)    All other components within normal limits  BETA-HYDROXYBUTYRIC ACID - Abnormal; Notable for the following components:   Beta-Hydroxybutyric Acid >8.00 (*)    All other components within normal limits  GLUCOSE, CAPILLARY - Abnormal; Notable for the following components:   Glucose-Capillary >600 (*)    All other components within normal limits  CBG MONITORING, ED - Abnormal; Notable for the following components:   Glucose-Capillary >600 (*)    All other components within normal limits  CBG MONITORING, ED - Abnormal; Notable for the following components:   Glucose-Capillary >600 (*)    All other components within normal limits  RESP PANEL BY RT-PCR (FLU A&B, COVID) ARPGX2  MRSA NEXT GEN BY PCR, NASAL  BASIC METABOLIC PANEL  BASIC METABOLIC PANEL  BASIC METABOLIC PANEL  BETA-HYDROXYBUTYRIC ACID  BETA-HYDROXYBUTYRIC ACID  BASIC METABOLIC PANEL  CBG MONITORING, ED    EKG EKG Interpretation  Date/Time:  Tuesday November 23 2021 11:54:11 EDT Ventricular Rate:  135 PR Interval:  177 QRS  Duration: 112 QT Interval:  310 QTC Calculation: 465 R Axis:   10 Text Interpretation: Ectopic atrial tachycardia, unifocal Incomplete left bundle branch block Confirmed by Sherwood Gambler [phone]) on 11/23/2021 12:17:29 PM  Radiology DG Chest Portable 1 View  Result Date: 11/23/2021 CLINICAL DATA:  Vomiting, dyspnea. EXAM: PORTABLE CHEST 1 VIEW COMPARISON:  April 06, 2019. FINDINGS: The heart size and mediastinal contours are within normal limits. Both lungs are clear. The visualized skeletal structures are unremarkable. IMPRESSION: No active disease. Electronically Signed   By: Marijo Conception M.D.   On: 11/23/2021 11:47    Procedures .Critical Care  Performed by: Sherwood Gambler, MD Authorized by: Sherwood Gambler, MD   Critical care provider statement:    Critical care time (minutes):  40   Critical care time was exclusive of:  Separately billable procedures and treating other patients   Critical care was necessary to treat or prevent imminent or life-threatening deterioration of the following conditions:  Endocrine crisis and circulatory failure   Critical care was time spent personally by me on the following activities:  Development of treatment plan with patient or surrogate, discussions with consultants, evaluation of patient's response to treatment, examination of patient, ordering and review of laboratory studies, ordering and review of radiographic studies, ordering and performing treatments and interventions, pulse oximetry, re-evaluation of patient's condition and review of old charts     Medications Ordered in ED Medications  insulin regular, human (MYXREDLIN) 100 units/ 100 mL infusion (11 Units/hr Intravenous Infusion Verify 11/23/21 1531)  lactated ringers infusion ( Intravenous Infusion Verify 11/23/21 1531)  dextrose 5 % in lactated ringers infusion (has no administration in time range)  dextrose 50 % solution 0-50 mL (has no administration in time range)  lactated  ringers bolus 1,000 mL (1,000 mLs Intravenous New Bag/Given 11/23/21 1229)  lactated ringers bolus 1,000 mL (0 mLs Intravenous Stopped 11/23/21 1353)  lactated ringers bolus  1,000 mL (0 mLs Intravenous Stopped 11/23/21 1451)    ED Course/ Medical Decision Making/ A&P                           Medical Decision Making Amount and/or Complexity of Data Reviewed Labs: ordered. Radiology: ordered.  Risk Prescription drug management. Decision regarding hospitalization.   Patient present with DKA.  He is ill-appearing and was started on IV fluids.  After lab work and potassium returned he was started on insulin.  His glucose is over 1100.  He has acute kidney injury and anion gap acidosis consistent with DKA.  Unclear what is causing his lip swelling though it is fairly mild and I do not think there is an acute infection or at least no abscess currently.  No drainage indicated.  No other oropharyngeal abnormality noted.  He is critically ill and will need the ICU.  I discussed with Dr. Denton Brick who will admit.         Final Clinical Impression(s) / ED Diagnoses Final diagnoses:  Diabetic ketoacidosis without coma associated with type 2 diabetes mellitus [facility])    Rx / DC Orders ED Discharge Orders     None         Sherwood Gambler, MD 11/23/21 1549

## 2021-11-23 NOTE — ED Notes (Signed)
Pt given an urinal and aware we need a sample.

## 2021-11-23 NOTE — Addendum Note (Signed)
Addended by: Coral Spikes on: 11/23/2021 12:56 PM   Modules accepted: Level of Service

## 2021-11-23 NOTE — Assessment & Plan Note (Signed)
Patient is ill-appearing.  Tachycardic.  Patient is clinically dehydrated.  Likely secondary to hyperglycemia and recent medication changes.  Patient is going directly to the ER for urgent assessment/evaluation.  Needs labs, IV fluids.  Pending work-up may need hospitalization.

## 2021-11-23 NOTE — Progress Notes (Signed)
Subjective:  Patient ID: Terry [name], male    DOB: 09-Sep-1960  Age: 61 y.o. MRN: [id number]  CC: Chief Complaint  Patient presents with   Emesis    24 hrs, patient reports has been taking trulicity 1.5 mg and has been titrating up to 4.5 under endocrinology States has not taken any other medications for the past 3 days   Oral Swelling    X 2weeks    HPI:  61 year old male with uncontrolled type 2 diabetes presents for evaluation of the above.  Patient reports that he has been feeling poorly for the past 2 days.  Has had a recent increase in Trulicity.  I believe that this was done to abruptly.  He went from 1.5-4.5 in the span of 2 weeks.  He reports ongoing nausea and vomiting.  Sugars markedly elevated.  Also experiencing some swelling of the lips.  No difficulty breathing.  No relieving factors.  Reports crampy abdominal pain.  Patient Active Problem List   Diagnosis Date Noted   Intractable nausea and vomiting 11/23/2021   Hypercalcemia 04/30/2021   Encounter for long-term opiate analgesic use 09/30/2020   Hyperlipidemia associated with type 2 diabetes mellitus ([place]) 06/29/2020   GERD (gastroesophageal reflux disease) 11/08/2018   Osteoarthritis 10/30/2017   Diabetes mellitus with proteinuria ([place]) 07/01/2015   Chronic pain of left ankle 08/01/2014   Right hip pain 08/01/2014   Essential hypertension, benign 08/27/2012    Social Hx   Social History   Socioeconomic History   Marital status: Married    Spouse name: Not on file   Number of children: Not on file   Years of education: Not on file   Highest education level: Not on file  Occupational History   Not on file  Tobacco Use   Smoking status: Former    Types: Cigarettes    Quit date: 06/15/1992    Years since quitting: 29.4   Smokeless tobacco: Never  Vaping Use   Vaping Use: Never used  Substance and Sexual Activity   Alcohol use: Never   Drug use: Not Currently    Types: Marijuana    Comment: last  used 1970   Sexual activity: Not on file  Other Topics Concern   Not on file  Social History Narrative   Not on file   Social Determinants of Health   Financial Resource Strain: Not on file  Food Insecurity: Not on file  Transportation Needs: Not on file  Physical Activity: Not on file  Stress: Not on file  Social Connections: Not on file    Review of Systems Per HPI  Objective:  BP 118/64   Pulse (!) 135   Temp (!) 97 F (36.1 C)   Ht '5\' 10"'$  (1.778 m)   Wt 198 lb (89.8 kg)   SpO2 93%   BMI 28.41 kg/m      11/23/2021   11:55 AM 11/23/2021   11:13 AM 11/23/2021   11:10 AM  BP/Weight  Systolic BP 093  818  Diastolic BP 73  68  Wt. (Lbs)  190   BMI  27.26 kg/m2     Physical Exam Vitals and nursing note reviewed.  Constitutional:      Comments: Patient is alert but is ill-appearing.  HENT:     Head: Normocephalic and atraumatic.     Mouth/Throat:     Mouth: Mucous membranes are dry.  Eyes:     Conjunctiva/sclera: Conjunctivae normal.  Cardiovascular:     Rate  and Rhythm: Regular rhythm. Tachycardia present.  Pulmonary:     Effort: Pulmonary effort is normal.     Breath sounds: Normal breath sounds. No wheezing, rhonchi or rales.  Abdominal:     General: There is no distension.     Comments: No significant tenderness on exam.     Lab Results  Component Value Date   WBC 7.0 11/22/2019   HGB 12.0 (L) 11/22/2019   HCT 34.9 (L) 11/22/2019   PLT 130 (L) 11/22/2019   GLUCOSE 332 (H) 08/06/2021   CHOL 141 08/06/2021   TRIG 262 (H) 08/06/2021   HDL 25 (L) 08/06/2021   LDLCALC 64 08/06/2021   ALT 30 08/06/2021   AST 26 08/06/2021   NA 138 08/06/2021   K 5.2 (H) 08/06/2021   CL 106 08/06/2021   CREATININE 1.65 (H) 08/06/2021   BUN 28 (H) 08/06/2021   CO2 25 08/06/2021   TSH 0.713 04/30/2021   PSA 0.16 02/25/2014   INR 0.9 04/04/2019   HGBA1C 9.1 10/15/2021   MICROALBUR 150 07/09/2021     Assessment & Plan:   Problem List Items Addressed This  Visit       Digestive   Intractable nausea and vomiting    Patient is ill-appearing.  Tachycardic.  Patient is clinically dehydrated.  Likely secondary to hyperglycemia and recent medication changes.  Patient is going directly to the ER for urgent assessment/evaluation.  Needs labs, IV fluids.  Pending work-up may need hospitalization.      [place]

## 2021-11-23 NOTE — Patient Instructions (Signed)
Go directly to the ER.  Best of luck  Dr. Lacinda Axon

## 2021-11-23 NOTE — ED Triage Notes (Signed)
Pt presents with vomiting, cramping and high blood sugars x 2 days, recently increased his diabetic medications 2 weeks ago.

## 2021-11-23 NOTE — ED Notes (Signed)
Pt c/o "high" blood sugar, generalized abdominal pain, and n/v x2 days.

## 2021-11-23 NOTE — Progress Notes (Signed)
Patient's heart rate had been sustaining in the 160's as high as 170 at times in SVT rhythm. Other vitals are stable and patient is A and O x 4. Complains of severe nausea with several episodes of vomiting, also 8/10 pain. Did a stat EKG, while obtaining the reading the patient vomited and doing so had a vagal response lowering it to the 110's -120's. MD notified.

## 2021-11-23 NOTE — ED Notes (Signed)
Hospitalist at bedside 

## 2021-11-23 NOTE — Telephone Encounter (Signed)
Patient's wife made aware - they are at the ER now. Whitney please see message below. Does patient need a med change now that Dr Dorris Fetch advised to stop the medication

## 2021-11-24 DIAGNOSIS — E1110 Type 2 diabetes mellitus with ketoacidosis without coma: Secondary | ICD-10-CM | POA: Diagnosis not present

## 2021-11-24 DIAGNOSIS — E1169 Type 2 diabetes mellitus with other specified complication: Secondary | ICD-10-CM | POA: Diagnosis not present

## 2021-11-24 DIAGNOSIS — K219 Gastro-esophageal reflux disease without esophagitis: Secondary | ICD-10-CM | POA: Diagnosis not present

## 2021-11-24 DIAGNOSIS — I10 Essential (primary) hypertension: Secondary | ICD-10-CM | POA: Diagnosis not present

## 2021-11-24 LAB — BASIC METABOLIC PANEL
Anion gap: 10 (ref 5–15)
BUN: 38 mg/dL — ABNORMAL HIGH (ref 6–20)
CO2: 29 mmol/L (ref 22–32)
Calcium: 9.9 mg/dL (ref 8.9–10.3)
Chloride: 105 mmol/L (ref 98–111)
Creatinine, Ser: 1.62 mg/dL — ABNORMAL HIGH (ref 0.61–1.24)
GFR, Estimated: 48 mL/min — ABNORMAL LOW (ref >60)
Glucose, Bld: 187 mg/dL — ABNORMAL HIGH (ref 70–99)
Potassium: 4.8 mmol/L (ref 3.5–5.1)
Sodium: 144 mmol/L (ref 135–145)

## 2021-11-24 LAB — RENAL FUNCTION PANEL
Albumin: 4.4 g/dL (ref 3.5–5.0)
Anion gap: 13 (ref 5–15)
BUN: 41 mg/dL — ABNORMAL HIGH (ref 6–20)
CO2: 25 mmol/L (ref 22–32)
Calcium: 9.9 mg/dL (ref 8.9–10.3)
Chloride: 105 mmol/L (ref 98–111)
Creatinine, Ser: 1.75 mg/dL — ABNORMAL HIGH (ref 0.61–1.24)
GFR, Estimated: 44 mL/min — ABNORMAL LOW (ref >60)
Glucose, Bld: 200 mg/dL — ABNORMAL HIGH (ref 70–99)
Phosphorus: 1.6 mg/dL — ABNORMAL LOW (ref 2.5–4.6)
Potassium: 3.7 mmol/L (ref 3.5–5.1)
Sodium: 143 mmol/L (ref 135–145)

## 2021-11-24 LAB — GLUCOSE, CAPILLARY
Glucose-Capillary: 172 mg/dL — ABNORMAL HIGH (ref 70–99)
Glucose-Capillary: 177 mg/dL — ABNORMAL HIGH (ref 70–99)
Glucose-Capillary: 177 mg/dL — ABNORMAL HIGH (ref 70–99)
Glucose-Capillary: 183 mg/dL — ABNORMAL HIGH (ref 70–99)
Glucose-Capillary: 185 mg/dL — ABNORMAL HIGH (ref 70–99)
Glucose-Capillary: 188 mg/dL — ABNORMAL HIGH (ref 70–99)
Glucose-Capillary: 192 mg/dL — ABNORMAL HIGH (ref 70–99)
Glucose-Capillary: 195 mg/dL — ABNORMAL HIGH (ref 70–99)
Glucose-Capillary: 195 mg/dL — ABNORMAL HIGH (ref 70–99)
Glucose-Capillary: 202 mg/dL — ABNORMAL HIGH (ref 70–99)
Glucose-Capillary: 206 mg/dL — ABNORMAL HIGH (ref 70–99)
Glucose-Capillary: 218 mg/dL — ABNORMAL HIGH (ref 70–99)
Glucose-Capillary: 235 mg/dL — ABNORMAL HIGH (ref 70–99)
Glucose-Capillary: 278 mg/dL — ABNORMAL HIGH (ref 70–99)
Glucose-Capillary: 306 mg/dL — ABNORMAL HIGH (ref 70–99)

## 2021-11-24 LAB — BETA-HYDROXYBUTYRIC ACID: Beta-Hydroxybutyric Acid: 1.82 mmol/L — ABNORMAL HIGH (ref 0.05–0.27)

## 2021-11-24 LAB — MRSA NEXT GEN BY PCR, NASAL: MRSA by PCR Next Gen: NOT DETECTED

## 2021-11-24 LAB — HIV ANTIBODY (ROUTINE TESTING W REFLEX): HIV Screen 4th Generation wRfx: NONREACTIVE

## 2021-11-24 MED ORDER — INSULIN ASPART 100 UNIT/ML IJ SOLN
0.0000 [IU] | Freq: Every day | INTRAMUSCULAR | Status: DC
  Administered 2021-11-24: 2 [IU] via SUBCUTANEOUS

## 2021-11-24 MED ORDER — CHLORHEXIDINE GLUCONATE CLOTH 2 % EX PADS
6.0000 | MEDICATED_PAD | Freq: Every day | CUTANEOUS | Status: DC
  Administered 2021-11-24 – 2021-11-26 (×3): 6 via TOPICAL

## 2021-11-24 MED ORDER — INSULIN ASPART 100 UNIT/ML IJ SOLN: 15.0000 [IU] | Freq: Three times a day (TID) | INTRAMUSCULAR | Status: DC

## 2021-11-24 MED ORDER — LACTATED RINGERS IV SOLN: INTRAVENOUS | Status: DC

## 2021-11-24 MED ORDER — INSULIN GLARGINE-YFGN 100 UNIT/ML ~~LOC~~ SOLN
55.0000 [IU] | SUBCUTANEOUS | Status: DC
  Administered 2021-11-24: 55 [IU] via SUBCUTANEOUS
  Filled 2021-11-24 (×2): qty 0.55

## 2021-11-24 MED ORDER — INSULIN ASPART 100 UNIT/ML IJ SOLN
0.0000 [IU] | Freq: Three times a day (TID) | INTRAMUSCULAR | Status: DC
  Administered 2021-11-24: 15 [IU] via SUBCUTANEOUS
  Administered 2021-11-25: 4 [IU] via SUBCUTANEOUS
  Administered 2021-11-25: 11 [IU] via SUBCUTANEOUS
  Administered 2021-11-25: 7 [IU] via SUBCUTANEOUS
  Administered 2021-11-26 (×2): 4 [IU] via SUBCUTANEOUS

## 2021-11-24 MED ORDER — POTASSIUM PHOSPHATES 15 MMOLE/5ML IV SOLN
30.0000 mmol | Freq: Once | INTRAVENOUS | Status: AC
  Administered 2021-11-24: 30 mmol via INTRAVENOUS
  Filled 2021-11-24: qty 10

## 2021-11-24 MED ORDER — INSULIN ASPART 100 UNIT/ML IJ SOLN: 0.0000 [IU] | Freq: Three times a day (TID) | INTRAMUSCULAR | Status: DC

## 2021-11-24 MED ORDER — INSULIN ASPART 100 UNIT/ML IJ SOLN
0.0000 [IU] | Freq: Three times a day (TID) | INTRAMUSCULAR | Status: DC
  Administered 2021-11-24: 3 [IU] via SUBCUTANEOUS

## 2021-11-24 NOTE — Progress Notes (Signed)
PROGRESS NOTE   Terry [name]  SNK:[id number] DOB: 1961-02-08 DOA: 11/23/2021 PCP: Kathyrn Drown, MD   Chief Complaint  Patient presents with   Hyperglycemia   Level of care: Telemetry  Brief Admission History:  61 y.o. male with history of diabetes mellitus type 2, HTN, HLD, history of transient A-fib/SVT in the setting of DKA and COVID infection previously and GERD who presents to the ED with several days of fatigue malaise and generalized weakness and was admitted with severe DKA.     Assessment and Plan:  DKA - high insulin resistance - his acidosis has corrected - plan is to transition back to subcutaneous insulin for lunchtime - while NPO on IV insulin he is requiring about 4 units per hour - plan to transition to basal bolus supplemental insulin  - DC dextrose infusion - monitor CBG 5x per day   AKI  - prerenal from severe dehydration - improving with IV fluid - continue LR but reduce rate - recheck BMP in AM   Transient Afib RVR/SVT - resumed home metoprolol XL 100 mg daily  - he has been on ASA for CHADVASC score of 2   Leukocytosis  - WBC trending down with treatment of DKA  - suspect reactive to acute DKA illness   Nausea and vomiting  - secondary to DKA  - improving with treatment of DKA   Essential Hypertension  - restarted home amlodipine and metoprolol - hopefully can restart ARB soon as renal function improves  GERD - continue protonix therapy for GI protection   DVT prophylaxis: enoxaparin  Code Status: FULL   Family Communication:  Disposition: Status is: Inpatient Remains inpatient appropriate because: Intensity    Consultants:  N/a  Procedures:  N/a  Antimicrobials:  N/a   Subjective: Pt says he is not feeling like he can eat this morning but would be willing to try for lunch.    Objective: Vitals:   11/24/21 0738 11/24/21 0800 11/24/21 0900 11/24/21 1000  BP:  (!) 154/61 (!) 169/72 128/67  Pulse: 89 87 98 87  Resp: '14 18 13  15  '$ Temp: 99 F (37.2 C)     TempSrc: Oral     SpO2: 96% 94% 97% 95%  Weight:      Height:        Intake/Output Summary (Last 24 hours) at 11/24/2021 1029 Last data filed at 11/24/2021 0839 Gross per 24 hour  Intake 6248.64 ml  Output 2750 ml  Net 3498.64 ml   Filed Weights   11/23/21 1113  Weight: 86.2 kg   Examination:  General exam: somnolent but arousable, chronically ill appearing, Appears calm and comfortable  Respiratory system: Clear to auscultation. Respiratory effort normal. Cardiovascular system: normal S1 & S2 heard. No JVD, murmurs, rubs, gallops or clicks. No pedal edema. Gastrointestinal system: Abdomen is nondistended, soft and nontender. No organomegaly or masses felt. Normal bowel sounds heard. Central nervous system: Alert and oriented. No focal neurological deficits. Extremities: Symmetric 5 x 5 power. Skin: No rashes, lesions or ulcers. Psychiatry: Judgement and insight appear UTD. Mood & affect appropriate.   Data Reviewed: I have personally reviewed following labs and imaging studies  CBC: Recent Labs  Lab 11/23/21 1117  WBC 21.2*  HGB 14.5  HCT 42.3  MCV 90.2  PLT 937    Basic Metabolic Panel: Recent Labs  Lab 11/23/21 1117 11/23/21 1440 11/23/21 2146 11/24/21 0228 11/24/21 0814  NA 124* 131* 140 143 144  K 5.9* 4.8 4.0  3.7 4.8  CL 78* 90* 105 105 105  CO2 10* 10* 19* 25 29  GLUCOSE 1,134* 865* 210* 200* 187*  BUN 59* 55* 39* 41* 38*  CREATININE 3.53* 2.80* 1.59* 1.75* 1.62*  CALCIUM 9.7 9.2 9.4 9.9 9.9  PHOS  --   --   --  1.6*  --     CBG: Recent Labs  Lab 11/24/21 0536 11/24/21 0646 11/24/21 0756 11/24/21 0901 11/24/21 1003  GLUCAP 177* 183* 172* 195* 185*    Recent Results (from the past 240 hour(s))  Resp Panel by RT-PCR (Flu A&B, Covid) Anterior Nasal Swab     Status: None   Collection Time: 11/23/21 12:20 PM   Specimen: Anterior Nasal Swab  Result Value Ref Range Status   SARS Coronavirus 2 by RT PCR NEGATIVE  NEGATIVE Final    Comment: (NOTE) SARS-CoV-2 target nucleic acids are NOT DETECTED.  The SARS-CoV-2 RNA is generally detectable in upper respiratory specimens during the acute phase of infection. The lowest concentration of SARS-CoV-2 viral copies this assay can detect is 138 copies/mL. A negative result does not preclude SARS-Cov-2 infection and should not be used as the sole basis for treatment or other patient management decisions. A negative result may occur with  improper specimen collection/handling, submission of specimen other than nasopharyngeal swab, presence of viral mutation(s) within the areas targeted by this assay, and inadequate number of viral copies(<138 copies/mL). A negative result must be combined with clinical observations, patient history, and epidemiological information. The expected result is Negative.  Fact Sheet for Patients:  EntrepreneurPulse.com.au  Fact Sheet for Healthcare Providers:  IncredibleEmployment.be  This test is no t yet approved or cleared by the [place] FDA and  has been authorized for detection and/or diagnosis of SARS-CoV-2 by FDA under an Emergency Use Authorization (EUA). This EUA will remain  in effect (meaning this test can be used) for the duration of the COVID-19 declaration under Section 564(b)(1) of the Act, 21 U.S.C.section 360bbb-3(b)(1), unless the authorization is terminated  or revoked sooner.       Influenza A by PCR NEGATIVE NEGATIVE Final   Influenza B by PCR NEGATIVE NEGATIVE Final    Comment: (NOTE) The Xpert Xpress SARS-CoV-2/FLU/RSV plus assay is intended as an aid in the diagnosis of influenza from Nasopharyngeal swab specimens and should not be used as a sole basis for treatment. Nasal washings and aspirates are unacceptable for Xpert Xpress SARS-CoV-2/FLU/RSV testing.  Fact Sheet for Patients: EntrepreneurPulse.com.au  Fact Sheet for Healthcare  Providers: IncredibleEmployment.be  This test is not yet approved or cleared by the [place] FDA and has been authorized for detection and/or diagnosis of SARS-CoV-2 by FDA under an Emergency Use Authorization (EUA). This EUA will remain in effect (meaning this test can be used) for the duration of the COVID-19 declaration under Section 564(b)(1) of the Act, 21 U.S.C. section 360bbb-3(b)(1), unless the authorization is terminated or revoked.  Performed at [facility], [address], [place], [place] [address]      Radiology Studies: DG Abd 2 Views  Result Date: 11/23/2021 CLINICAL DATA:  Vomiting and abdominal pain. EXAM: ABDOMEN - 2 VIEW COMPARISON:  None Available. FINDINGS: Overall bowel gas pattern is nonobstructive. No evidence of soft tissue mass or abnormal fluid collection. Evidence of free intraperitoneal air. No evidence of renal or ureteral stone is seen. No acute-appearing osseous abnormality. Lung bases appear clear. IMPRESSION: Negative.  Nonobstructive bowel gas pattern. Electronically Signed   By: Roxy Horseman.D.  On: 11/23/2021 16:39   DG Chest Portable 1 View  Result Date: 11/23/2021 CLINICAL DATA:  Vomiting, dyspnea. EXAM: PORTABLE CHEST 1 VIEW COMPARISON:  April 06, 2019. FINDINGS: The heart size and mediastinal contours are within normal limits. Both lungs are clear. The visualized skeletal structures are unremarkable. IMPRESSION: No active disease. Electronically Signed   By: Marijo Conception M.D.   On: 11/23/2021 11:47    Scheduled Meds:  amLODipine  10 mg Oral Daily   aspirin EC  81 mg Oral Q breakfast   Chlorhexidine Gluconate Cloth  6 each Topical Daily   enoxaparin (LOVENOX) injection  40 mg Subcutaneous Q24H   insulin aspart  0-9 Units Subcutaneous TID WC   insulin aspart  15 Units Subcutaneous TID WC   insulin glargine-yfgn  55 Units Subcutaneous Q24H   metoprolol succinate  100 mg Oral Daily   multivitamin with minerals  1  tablet Oral Daily   pantoprazole  40 mg Oral Daily   rosuvastatin  40 mg Oral Daily   Continuous Infusions:  dextrose 5% lactated ringers 125 mL/hr at 11/24/21 0839   insulin 3.6 Units/hr (11/24/21 0839)   lactated ringers     potassium PHOSPHATE IVPB (in mmol) 30 mmol (11/24/21 0932)     LOS: 1 day   Critical Care Procedure Note Authorized and Performed by: Murvin Natal MD  Total Critical Care time:  47 mins Due to a high probability of clinically significant, life threatening deterioration, the patient required my highest level of preparedness to intervene emergently and I personally spent this critical care time directly and personally managing the patient.  This critical care time included obtaining a history; examining the patient, pulse oximetry; ordering and review of studies; arranging urgent treatment with development of a management plan; evaluation of patient's response of treatment; frequent reassessment; and discussions with other providers.  This critical care time was performed to assess and manage the high probability of imminent and life threatening deterioration that could result in multi-organ failure.  It was exclusive of separately billable procedures and treating other patients and teaching time.    Irwin Brakeman, MD How to contact the [facility] Attending or Consulting provider [place] or covering provider during after hours [place], for this patient?  Check the care team in [facility] and look for a) attending/consulting TR[facility] provider listed and b) the [facility] team listed Log into [email or web address] and use [place]'s universal password to access. If you do not have the password, please contact the hospital operator. Locate the [facility] provider you are looking for under [facility] and page to a number that you can be directly reached. If you still have difficulty reaching the provider, please page the [facility] (Director on Call) for the Hospitalists listed on amion for  assistance.  11/24/2021, 10:29 AM

## 2021-11-24 NOTE — Hospital Course (Signed)
61 y.o. male with history of diabetes mellitus type 2, HTN, HLD, history of transient A-fib/SVT in the setting of DKA and COVID infection previously and GERD who presents to the ED with several days of fatigue malaise and generalized weakness and was admitted with severe DKA.

## 2021-11-24 NOTE — Progress Notes (Signed)
  Transition of Care [facility]) Screening Note   Patient Details  Name: Terry [name] Date of Birth: [birth year]   Transition of Care [facility]) CM/SW Contact:    Ihor Gully, LCSW Phone Number: 11/24/2021, 2:30 PM    Transition of Care Department [facility]) has reviewed patient and no TOC needs have been identified at this time. We will continue to monitor patient advancement through interdisciplinary progression rounds. If new patient transition needs arise, please place a TOC consult.

## 2021-11-24 NOTE — Inpatient Diabetes Management (Addendum)
Inpatient Diabetes Program Recommendations  AACE/ADA: New Consensus Statement on Inpatient Glycemic Control Target Ranges:  Prepandial:   less than 140 mg/dL      Peak postprandial:   less than 180 mg/dL (1-2 hours)      Critically ill patients:  140 - 180 mg/dL    Latest Reference Range & Units 11/24/21 00:46 11/24/21 01:33 11/24/21 02:44 11/24/21 03:44 11/24/21 04:48 11/24/21 05:36  Glucose-Capillary 70 - 99 mg/dL 202 (H) 188 (H) 206 (H) 195 (H) 177 (H) 177 (H)    Latest Reference Range & Units 11/23/21 11:17  CO2 22 - 32 mmol/L 10 (L)  Glucose 70 - 99 mg/dL 1,134 (HH)  Anion gap 5 - 15  36 (H)    Latest Reference Range & Units 11/23/21 11:31  Beta-Hydroxybutyric Acid 0.05 - 0.27 mmol/L >8.00 (H)   Review of Glycemic Control  Diabetes history: DM2 Outpatient Diabetes medications: Humulin R U500 60 units TID with meals, Trulicity 4.5 mg Qweek Current orders for Inpatient glycemic control: IV insulin  Inpatient Diabetes Program Recommendations:    Insulin: Once acidosis is cleared and provider is ready to transition from IV to SQ insulin, please consider ordering Semglee 22 units BID (based on 86.2 kg x 0.5 units), CBGs Q4H, and Novolog 0-15 units Q4H. Once diet is ordered and patient able to tolerate, will likely need meal coverage insulin as well.  NOTE: Patient admitted with DKA, AKI, tachycardia, leukocytosis, intractable emesis, hyponatremia, and hypertension. Per H&P, patient has "had intractable emesis over last couple of days. Patient's symptoms coincide with attempt to aggressively titrate up his Trulicity--  He went from 1.5-4.5 in the span of 2 weeks." In reviewing chart, noted patient sees Rayetta Pigg, NP and was last seen 10/15/21. Per office note on 10/15/21, patient was advised to " increase his Trulicity to 3 mg SQ weekly (using 2 of his 1.5 mg injections weekly until he depletes his current supply), then will increase to 4.5 mg SQ weekly.  Due to drastic drops in  glucose will stop his Glipizide altogether and lower his U 500 to 60 units TID with meals if glucose is above 90 and he is eating."  Addendum 11/24/21'@11'$ :15-Called patient cell phone to speak with patient but patient's wife answered his cell phone. Patient's wife stated she was at the bedside with patient and that he was "in an out of it". She states that she is knowledgeable about patient's medications; she confirms that Trulicity was recently increased from 1.5 mg to 3 mg then to 4.5 mg. She reports that patient was taking Trulicity 3 mg Qweek and was tolerating it without any issues. Patient took Trulicity 4.5 mg this past Sunday and he started vomiting on Monday and has felt bad since then. She reports that patient took Humulin R U500 on Monday but she is not sure when on Monday he last took the Humulin R U500. She notes that they did contact W. Reardon's office and was told to stop the Trulicity. Discussed initial glucose of 1134 mg/dl, DKA, and treatment with IV insulin. Patient's wife reports that patient uses a Dexcom CGM for glucose monitoring. Discussed that if Trulicity is stopped completely, the dose of Humulin R U500 may need to be increased if glucose is consistently elevated. Encouraged patient's wife to have patient call and get appointment to follow up with Abbe Amsterdam, NP with [facility] regarding DM. Patient's wife verbalized understanding of information and states that she has no questions at this time.  Thanks,  Barnie Alderman, RN, MSN, [place] Diabetes Coordinator Inpatient Diabetes Program [phone] (Team Pager from 8am to [place])

## 2021-11-25 ENCOUNTER — Encounter (HOSPITAL_COMMUNITY): Payer: Self-pay | Admitting: Family Medicine

## 2021-11-25 DIAGNOSIS — I4891 Unspecified atrial fibrillation: Secondary | ICD-10-CM

## 2021-11-25 DIAGNOSIS — N179 Acute kidney failure, unspecified: Secondary | ICD-10-CM

## 2021-11-25 DIAGNOSIS — E1169 Type 2 diabetes mellitus with other specified complication: Secondary | ICD-10-CM | POA: Diagnosis not present

## 2021-11-25 DIAGNOSIS — E1311 Other specified diabetes mellitus with ketoacidosis with coma: Secondary | ICD-10-CM | POA: Diagnosis not present

## 2021-11-25 DIAGNOSIS — D72829 Elevated white blood cell count, unspecified: Secondary | ICD-10-CM

## 2021-11-25 DIAGNOSIS — I10 Essential (primary) hypertension: Secondary | ICD-10-CM | POA: Diagnosis not present

## 2021-11-25 DIAGNOSIS — K219 Gastro-esophageal reflux disease without esophagitis: Secondary | ICD-10-CM | POA: Diagnosis not present

## 2021-11-25 LAB — GLUCOSE, CAPILLARY
Glucose-Capillary: 239 mg/dL — ABNORMAL HIGH (ref 70–99)
Glucose-Capillary: 258 mg/dL — ABNORMAL HIGH (ref 70–99)
Glucose-Capillary: 220 mg/dL — ABNORMAL HIGH (ref 70–99)
Glucose-Capillary: 172 mg/dL — ABNORMAL HIGH (ref 70–99)
Glucose-Capillary: 157 mg/dL — ABNORMAL HIGH (ref 70–99)
Glucose-Capillary: 76 mg/dL (ref 70–99)

## 2021-11-25 LAB — PHOSPHORUS: Phosphorus: 2.9 mg/dL (ref 2.5–4.6)

## 2021-11-25 LAB — BASIC METABOLIC PANEL
Anion gap: 8 (ref 5–15)
BUN: 28 mg/dL — ABNORMAL HIGH (ref 6–20)
CO2: 30 mmol/L (ref 22–32)
Calcium: 9.3 mg/dL (ref 8.9–10.3)
Chloride: 100 mmol/L (ref 98–111)
Creatinine, Ser: 1.52 mg/dL — ABNORMAL HIGH (ref 0.61–1.24)
GFR, Estimated: 52 mL/min — ABNORMAL LOW (ref >60)
Glucose, Bld: 264 mg/dL — ABNORMAL HIGH (ref 70–99)
Potassium: 4.5 mmol/L (ref 3.5–5.1)
Sodium: 138 mmol/L (ref 135–145)

## 2021-11-25 LAB — MAGNESIUM: Magnesium: 1.9 mg/dL (ref 1.7–2.4)

## 2021-11-25 MED ORDER — SUCRALFATE 1 GM/10ML PO SUSP: 1.0000 g | Freq: Three times a day (TID) | ORAL | Status: DC

## 2021-11-25 MED ORDER — INSULIN ASPART 100 UNIT/ML IJ SOLN
20.0000 [IU] | Freq: Three times a day (TID) | INTRAMUSCULAR | Status: DC
  Administered 2021-11-25: 20 [IU] via SUBCUTANEOUS

## 2021-11-25 MED ORDER — DICYCLOMINE HCL 10 MG/5ML PO SOLN: 10.0000 mg | Freq: Once | ORAL | Status: DC

## 2021-11-25 MED ORDER — INSULIN GLARGINE-YFGN 100 UNIT/ML ~~LOC~~ SOLN
70.0000 [IU] | SUBCUTANEOUS | Status: DC
  Administered 2021-11-25: 70 [IU] via SUBCUTANEOUS
  Filled 2021-11-25 (×2): qty 0.7

## 2021-11-25 MED ORDER — DICYCLOMINE HCL 10 MG PO CAPS
10.0000 mg | ORAL_CAPSULE | Freq: Once | ORAL | Status: AC
  Administered 2021-11-25: 10 mg via ORAL
  Filled 2021-11-25: qty 1

## 2021-11-25 MED ORDER — SUCRALFATE 1 GM/10ML PO SUSP
1.0000 g | Freq: Three times a day (TID) | ORAL | Status: DC
  Administered 2021-11-25 – 2021-11-26 (×4): 1 g via ORAL
  Filled 2021-11-25 (×4): qty 10

## 2021-11-25 MED ORDER — METOPROLOL TARTRATE 5 MG/5ML IV SOLN
5.0000 mg | Freq: Once | INTRAVENOUS | Status: DC
Start: 2021-11-25 — End: 2021-11-26

## 2021-11-25 MED ORDER — PANTOPRAZOLE SODIUM 40 MG IV SOLR
40.0000 mg | Freq: Two times a day (BID) | INTRAVENOUS | Status: DC
  Administered 2021-11-25 – 2021-11-26 (×2): 40 mg via INTRAVENOUS
  Filled 2021-11-25 (×2): qty 10

## 2021-11-25 NOTE — Consult Note (Signed)
Gastroenterology Consult   Referring Provider: No ref. provider found Primary Care Physician:  Kathyrn Drown, MD Primary Gastroenterologist:  previously Dr. Laural Golden, reassigned to Dr. Gala Romney  Patient ID: [name]; [id number]; July 19, 1960   Admit date: 11/23/2021  LOS: 2 days   Date of Consultation: 11/25/2021  Reason for Consultation:  severe stomatitis, gastroparesis on Trulicity    History of Present Illness   RAEVON BROOM is a 61 y.o. male with history of reflux esophagitis, hypertension, hyperlipidemia, diabetes, osteoarthritis, paroxysmal atrial fibrillation (transient episode in January 2021 while admitted for COVID and DKA) who presented to the emergency department 2 days ago with lip swelling and vomiting.    Patient reported his lips have been bothering him for about a week, were dry and cracked.  However over 24 hours prior to presentation they became swollen.  He wondered if this was related to Trulicity, dosage has been increased from 1.'5mg'$  weekly. On 10/15/21 his dose was increased to '3mg'$  weekly (uses two 1.'5mg'$  injections) until his current supply was depleting and then increased to 4.'5mg'$  weekly.   Patient developed intractable N/V about 24 hours prior to admission. He was seen by his PCP who noted lip swelling, dehydration and directed him to the ED.  In the ED: Vital signs stable, afebrile.  Glucose 1134, beta hydroxybutyric acid >8-->3.82-->1.82.  BUN 59, creatinine 3.53 (up from 1.65 in May 2023), white blood cell count 21,200, hemoglobin 11.4, platelets 224,000.  Chest x-ray with no acute findings.  Abdominal x-ray with nonobstructive bowel gas pattern.  Today: Glucose 264, BUN 28, creatinine 1.52. He has a regular diet but prefers trying jello. Notes pain in the chest with swallowing carbonated beverages but not with water. Last episode of vomiting was yesterday. He denies abdominal pain. No BM since Monday but not able to eat for days. Prior to acute illness, he  rarely had heartburn, would take pantoprazole once per month. No dysphagia. No NSAIDs. Takes ASA '81mg'$  daily.   EGD May 2019 with Dr. Laural Golden: - Normal proximal esophagus and mid esophagus. - LA Grade C reflux esophagitis. - Benign-appearing esophageal stenosis. Dilated. - 3 cm hiatal hernia. - Normal stomach. - Normal duodenal bulb and second portion of the duodenum. - No specimens collected.  Colonoscopy May 2019 with Dr.Rehman: -Hyperplastic polyp removed -Next colonoscopy in May 2029    Prior to Admission medications   Medication Sig Start Date End Date Taking? Authorizing Provider  amLODipine (NORVASC) 10 MG tablet Take 1 tablet (10 mg total) by mouth daily. 11/05/21  Yes Strader, [place] M, PA-C  ascorbic acid (VITAMIN C) 500 MG tablet Take 1 tablet (500 mg total) by mouth daily. 04/11/19  Yes Roxan Hockey, MD  aspirin EC 81 MG tablet Take 1 tablet (81 mg total) by mouth daily with breakfast. 04/10/19  Yes Emokpae, Courage, MD  Dulaglutide (TRULICITY) 4.5 CB/6.3AG SOPN Inject 4.5 mg as directed once a week. 10/15/21  Yes Reardon, Juanetta Beets, NP  fenofibrate 160 MG tablet TAKE 1 TABLET BY MOUTH ONCE A DAY. 11/10/21  Yes Nida, Marella Chimes, MD  indapamide (LOZOL) 2.5 MG tablet TAKE (1) TABLET BY MOUTH ONCE DAILY. 07/16/21  Yes Luking, Elayne Snare, MD  insulin regular human CONCENTRATED (HUMULIN R U-500 KWIKPEN) 500 UNIT/ML KwikPen Inject 60 Units into the skin 3 (three) times daily with meals. 10/15/21  Yes Reardon, Juanetta Beets, NP  metoprolol succinate (TOPROL-XL) 100 MG 24 hr tablet TAKE (1) TABLET BY MOUTH ONCE DAILY. 07/16/21  Yes  Kathyrn Drown, MD  Multiple Vitamin (MULTIVITAMIN) tablet Take 1 tablet by mouth daily.   Yes [provider]  oxyCODONE-acetaminophen (PERCOCET) 10-325 MG tablet 1 taken 4 times daily as needed for pain 10/22/21  Yes Luking, Scott A, MD  pantoprazole (PROTONIX) 20 MG tablet Take 20 mg by mouth daily.   Yes [provider]  rosuvastatin  (CRESTOR) 40 MG tablet Take 1 tablet (40 mg total) by mouth daily. 07/16/21  Yes Luking, Elayne Snare, MD  sildenafil (REVATIO) 20 MG tablet Take 1-5 tablets before sexual activity as directed. No greater that 5 tablets in a day. 01/06/17  Yes Mikey Kirschner, MD  triamcinolone cream (KENALOG) 0.1 % Apply thin amount to top inner lip twice daily for 10 to 14 days till healed 10/22/21  Yes Luking, Elayne Snare, MD  valsartan (DIOVAN) 160 MG tablet Take 1 tablet (160 mg total) by mouth daily. 07/16/21  Yes Luking, Elayne Snare, MD  Continuous Blood Gluc Sensor (DEXCOM G6 SENSOR) MISC APPLY 1 SENSOR EVERY 10 DAYS FOR GLUCOSE MONITORING. 10/27/21   Brita Romp, NP  Continuous Blood Gluc Transmit (DEXCOM G6 TRANSMITTER) MISC USE AS DIRECTED. 03/26/21   Reardon, Loree Fee J, NP  mometasone (ELOCON) 0.1 % cream APPLY TO AFFECTED AREAS TWICE DAILY AS NEEDED. Patient not taking: Reported on 11/23/2021 04/15/19   Kathyrn Drown, MD                 TECHLITE PEN NEEDLES 32G X 4 MM MISC INJECT 4 TIMES DAILY AS DIRECTED. 06/12/20   Kathyrn Drown, MD  TRULICITY 1.5 [place]/7.0WC SOPN Inject 1.5 mg into the skin once a week. Patient not taking: Reported on 11/23/2021 11/01/21   Brita Romp, NP  zinc sulfate 220 (50 Zn) MG capsule Take 1 capsule (220 mg total) by mouth daily. Patient not taking: Reported on 11/23/2021 04/11/19   Roxan Hockey, MD    Current Facility-Administered Medications  Medication Dose Route Frequency Provider Last Rate Last Admin   acetaminophen (TYLENOL) tablet 650 mg  650 mg Oral Q6H PRN Emokpae, Courage, MD       Or   acetaminophen (TYLENOL) suppository 650 mg  650 mg Rectal Q6H PRN Emokpae, Courage, MD       amLODipine (NORVASC) tablet 10 mg  10 mg Oral Daily Emokpae, Courage, MD   10 mg at 11/25/21 3762   aspirin EC tablet 81 mg  81 mg Oral Q breakfast Emokpae, Courage, MD   81 mg at 11/25/21 0835   bisacodyl (DULCOLAX) suppository 10 mg  10 mg Rectal Daily PRN Roxan Hockey, MD        Chlorhexidine Gluconate Cloth 2 % PADS 6 each  6 each Topical Daily Zierle-Ghosh, Asia B, DO   6 each at 11/25/21 0837   dextrose 50 % solution 0-50 mL  0-50 mL Intravenous PRN Emokpae, Courage, MD       enoxaparin (LOVENOX) injection 40 mg  40 mg Subcutaneous Q24H Emokpae, Courage, MD   40 mg at 11/24/21 2132   insulin aspart (novoLOG) injection 0-20 Units  0-20 Units Subcutaneous TID WC Johnson, Clanford L, MD   11 Units at 11/25/21 0838   insulin aspart (novoLOG) injection 0-5 Units  0-5 Units Subcutaneous QHS Johnson, Clanford L, MD   2 Units at 11/24/21 2132   insulin aspart (novoLOG) injection 20 Units  20 Units Subcutaneous TID WC Johnson, Clanford L, MD       insulin glargine-yfgn (SEMGLEE) injection 70 Units  70 Units Subcutaneous Q24H Johnson, Clanford L, MD   70 Units at 11/25/21 0959   labetalol (NORMODYNE) injection 10 mg  10 mg Intravenous Q4H PRN Denton Brick, Courage, MD   10 mg at 11/24/21 1750   lactated ringers infusion   Intravenous Continuous Wynetta Emery, Clanford L, MD 65 mL/hr at 11/25/21 0524 New Bag at 11/25/21 0524   metoprolol succinate (TOPROL-XL) 24 hr tablet 100 mg  100 mg Oral Daily Emokpae, Courage, MD   100 mg at 11/25/21 2633   multivitamin with minerals tablet 1 tablet  1 tablet Oral Daily Emokpae, Courage, MD   1 tablet at 11/25/21 0836   ondansetron (ZOFRAN) tablet 4 mg  4 mg Oral Q6H PRN Roxan Hockey, MD   4 mg at 11/24/21 3545   Or   ondansetron (ZOFRAN) injection 4 mg  4 mg Intravenous Q6H PRN Roxan Hockey, MD   4 mg at 11/24/21 1658   oxyCODONE-acetaminophen (PERCOCET/ROXICET) 5-325 MG per tablet 1 tablet  1 tablet Oral Q8H PRN Hammons, Theone Murdoch, RPH   1 tablet at 11/25/21 6256   And   oxyCODONE (Oxy IR/ROXICODONE) immediate release tablet 5 mg  5 mg Oral Q8H PRN Hammons, Kimberly B, RPH   5 mg at 11/25/21 0835   pantoprazole (PROTONIX) EC tablet 40 mg  40 mg Oral Daily Emokpae, Courage, MD   40 mg at 11/25/21 0835   polyethylene glycol (MIRALAX / GLYCOLAX)  packet 17 g  17 g Oral Daily PRN Emokpae, Courage, MD       rosuvastatin (CRESTOR) tablet 40 mg  40 mg Oral Daily Emokpae, Courage, MD   40 mg at 11/25/21 0836    Allergies as of 11/23/2021 - Review Complete 11/23/2021  Allergen Reaction Noted   Invokana [canagliflozin] Other (See Comments) 05/06/2014   Altace [ramipril] Cough 06/09/2012   Metformin and related Diarrhea 05/01/2017    Past Medical History:  Diagnosis Date   Diabetes mellitus without complication ([place]) 3893   Hyperlipidemia    Hypertension    Osteoarthritis     Past Surgical History:  Procedure Laterality Date   ANKLE SURGERY Left 03/28/2005   2 plates/screws   COLONOSCOPY N/A 08/04/2017   Procedure: COLONOSCOPY;  Surgeon: Rogene Houston, MD;  [facility];  Service: Endoscopy;  Laterality: N/A;   ESOPHAGEAL DILATION N/A 08/04/2017   Procedure: ESOPHAGEAL DILATION;  Surgeon: Rogene Houston, MD;  [facility];  Service: Endoscopy;  Laterality: N/A;   ESOPHAGOGASTRODUODENOSCOPY N/A 08/04/2017   Procedure: ESOPHAGOGASTRODUODENOSCOPY (EGD);  Surgeon: Rogene Houston, MD;  [facility];  Service: Endoscopy;  Laterality: N/A;   POLYPECTOMY  08/04/2017   Procedure: POLYPECTOMY;  Surgeon: Rogene Houston, MD;  [facility];  Service: Endoscopy;;   ROOT CANAL      Family History  Problem Relation Age of Onset   Hypertension Mother    Diabetes Mother    Heart disease Mother    Hyperlipidemia Mother    Cancer Father        lung    Social History   Socioeconomic History   Marital status: Married    Spouse name: Not on file   Number of children: Not on file   Years of education: Not on file   Highest education level: Not on file  Occupational History   Not on file  Tobacco Use   Smoking status: Former    Types: Cigarettes    Quit date: 06/15/1992    Years  since quitting: 29.4   Smokeless tobacco: Never  Vaping Use   Vaping Use: Never used  Substance and  Sexual Activity   Alcohol use: Never   Drug use: Not Currently    Types: Marijuana    Comment: last used 1970   Sexual activity: Not on file  Other Topics Concern   Not on file  Social History Narrative   Not on file   Social Determinants of Health   Financial Resource Strain: Not on file  Food Insecurity: Not on file  Transportation Needs: Not on file  Physical Activity: Not on file  Stress: Not on file  Social Connections: Not on file  Intimate Partner Violence: Not on file     Review of System:   General: Negative for anorexia, weight loss, fever, chills, fatigue, weakness. Eyes: Negative for vision changes.  ENT: Negative for nasal congestion. Hoarse yesterday but improved.  CV: Negative for chest pain, angina, palpitations, dyspnea on exertion, peripheral edema.  Respiratory: Negative for dyspnea at rest, dyspnea on exertion, cough, sputum, wheezing.  GI: See history of present illness. GU:  Negative for dysuria, hematuria, urinary incontinence, urinary frequency, nocturnal urination.  MS: Negative for joint pain, low back pain.  Derm: Negative for rash or itching.  Neuro: Negative for weakness, abnormal sensation, seizure, frequent headaches, memory loss, confusion.  Psych: Negative for anxiety, depression, suicidal ideation, hallucinations.  Endo: Negative for unusual weight change.  Heme: Negative for bruising or bleeding. Allergy: Negative for rash or hives.      Physical Examination:   Vital signs in last 24 hours: Temp:  [98.5 F (36.9 C)-99.1 F (37.3 C)] 98.5 F (36.9 C) (08/31 0741) Pulse Rate:  [81-98] 87 (08/31 0800) Resp:  [12-23] 17 (08/31 0800) BP: (93-176)/(47-109) 176/71 (08/31 0800) SpO2:  [91 %-98 %] 96 % (08/31 0800)    General: Well-nourished, well-developed in no acute distress.  Head: Normocephalic, atraumatic.   Eyes: Conjunctiva pink, no icterus. Mouth: lips without swelling but crusted lesions noted, small ulcer noted on left side  of tongue.  Neck: Supple without thyromegaly, masses, or lymphadenopathy.  Lungs: Clear to auscultation bilaterally.  Heart: Regular rate and rhythm, no murmurs rubs or gallops.  Abdomen: Bowel sounds are normal, nontender, nondistended, no hepatosplenomegaly or masses, no abdominal bruits or hernia , no rebound or guarding.   Rectal: not performed Extremities: No lower extremity edema, clubbing, deformity.  Neuro: Alert and oriented x 4 , grossly normal neurologically.  Skin: Warm and dry, no rash or jaundice.   Psych: Alert and cooperative, normal mood and affect.        Intake/Output from previous day: 08/30 0701 - 08/31 0700 In: 3427.5 [P.O.:564; I.V.:2353.4; IV Piggyback:510.1] Out: 1050 [Urine:1050] Intake/Output this shift: No intake/output data recorded.  Lab Results:   CBC Recent Labs    11/23/21 1117  WBC 21.2*  HGB 14.5  HCT 42.3  MCV 90.2  PLT 224   BMET Recent Labs    11/24/21 0228 11/24/21 0814 11/25/21 0422  NA 143 144 138  K 3.7 4.8 4.5  CL 105 105 100  CO2 '25 29 30  '$ GLUCOSE 200* 187* 264*  BUN 41* 38* 28*  CREATININE 1.75* 1.62* 1.52*  CALCIUM 9.9 9.9 9.3   LFT Recent Labs    11/24/21 0228  ALBUMIN 4.4    Lipase Recent Labs    11/23/21 1700  LIPASE 22    PT/INR No results for input(s): "LABPROT", "INR" in the last 72 hours.  Hepatitis Panel No results for input(s): "HEPBSAG", "HCVAB", "HEPAIGM", "HEPBIGM" in the last 72 hours.   Imaging Studies:   DG Abd 2 Views  Result Date: 11/23/2021 CLINICAL DATA:  Vomiting and abdominal pain. EXAM: ABDOMEN - 2 VIEW COMPARISON:  None Available. FINDINGS: Overall bowel gas pattern is nonobstructive. No evidence of soft tissue mass or abnormal fluid collection. Evidence of free intraperitoneal air. No evidence of renal or ureteral stone is seen. No acute-appearing osseous abnormality. Lung bases appear clear. IMPRESSION: Negative.  Nonobstructive bowel gas pattern. Electronically Signed   By:  Franki Cabot M.D.   On: 11/23/2021 16:39   DG Chest Portable 1 View  Result Date: 11/23/2021 CLINICAL DATA:  Vomiting, dyspnea. EXAM: PORTABLE CHEST 1 VIEW COMPARISON:  April 06, 2019. FINDINGS: The heart size and mediastinal contours are within normal limits. Both lungs are clear. The visualized skeletal structures are unremarkable. IMPRESSION: No active disease. Electronically Signed   By: Marijo Conception M.D.   On: 11/23/2021 11:47  [4 week]  Assessment:   61 year old male history of diabetes, GERD, hypertension, hyperlipidemia presenting to the emergency department with intractable nausea/vomiting, lip swelling found to have acute kidney injury/dehydration, DKA.  GI consulted for severe stomatitis and gastroparesis from Trulicity.  Intractable vomiting/lip swelling: Onset of symptoms started with escalation of Trulicity dose.  Suspect he developed angioedema of the lips and intractable vomiting likely due to drug-induced gastroparesis.  Repetitive vomiting most likely has caused reflux esophagitis with associated odynophagia.  Overall oral cavity (mouth/throat) uninvolved otherwise.   Today he is improved. No vomiting today and started slow with oral intake. Has noted carbonated beverages are more painful to consume but tolerates water. He denies any symptoms prior to increasing Trulicity recently.   Plan:   Recommend conservative approach/supportive measures.  Switch pantoprazole to '40mg'$  IV BID.  Add Carafate one gram qac/qhs.  Avoid carbonated beverages.  Would advise liquids/soft diet until he feels better.  Hold off on endoscopic evaluation unless he is unable to progress as unlikely to change management/care.     LOS: 2 days   We would like to thank you for the opportunity to participate in the care of KRIKOR WILLET.  Laureen Ochs. Bernarda Caffey [facility] [phone] 8/31/202311:15 AM

## 2021-11-25 NOTE — Progress Notes (Signed)
PROGRESS NOTE   Terry [name]  XNA:[id number] DOB: 1961-03-27 DOA: 11/23/2021 PCP: Kathyrn Drown, MD   Chief Complaint  Patient presents with   Hyperglycemia   Level of care: Med-Surg  Brief Admission History:  61 y.o. male with history of diabetes mellitus type 2, HTN, HLD, history of transient A-fib/SVT in the setting of DKA and COVID infection previously and GERD who presents to the ED with several days of fatigue malaise and generalized weakness and was admitted with severe DKA.     Assessment and Plan:  DKA - high insulin resistance - his acidosis has corrected - plan is to transition back to subcutaneous insulin for lunchtime - while NPO on IV insulin he is requiring about 4 units per hour - plan to transition to basal bolus supplemental insulin  - DC dextrose infusion - monitor CBG 5x per day   AKI  - prerenal from severe dehydration - improving with IV fluid - continue LR but reduce rate - recheck BMP in AM   Transient Afib RVR/SVT - resumed home metoprolol XL 100 mg daily  - he has been on ASA for CHADVASC score of 2   Leukocytosis  - WBC trending down with treatment of DKA  - suspect reactive to acute DKA illness   Nausea and vomiting  - secondary to DKA  - improving with treatment of DKA   Essential Hypertension  - restarted home amlodipine and metoprolol - hopefully can restart ARB soon as renal function improves  GERD - continue protonix therapy for GI protection, IV BID ordered by GI team  DVT prophylaxis: enoxaparin  Code Status: FULL   Family Communication:  Disposition: Status is: Inpatient Remains inpatient appropriate because: Intensity    Consultants:  N/a  Procedures:  N/a  Antimicrobials:  N/a   Subjective: Pt says he hurts to swallow, feels like ulcers all the way down throat     Objective: Vitals:   11/25/21 1139 11/25/21 1200 11/25/21 1300 11/25/21 1545  BP:  (!) 184/76 (!) 142/74 (!) 150/89  Pulse:  99 96 94  Resp:  '20  18 18  '$ Temp: 98.2 F (36.8 C)  97.8 F (36.6 C) 98.2 F (36.8 C)  TempSrc: Axillary  Oral Oral  SpO2:  97% 97% 96%  Weight:      Height:        Intake/Output Summary (Last 24 hours) at 11/25/2021 1637 Last data filed at 11/25/2021 1200 Gross per 24 hour  Intake 1913.3 ml  Output 1801 ml  Net 112.3 ml   Filed Weights   11/23/21 1113  Weight: 86.2 kg   Examination:  General exam: mucosal stomatitis and black areas on lower lips, chronically ill appearing, Appears calm and comfortable  Respiratory system: Clear to auscultation. Respiratory effort normal. Cardiovascular system: normal S1 & S2 heard. No JVD, murmurs, rubs, gallops or clicks. No pedal edema. Gastrointestinal system: Abdomen is nondistended, soft and nontender. No organomegaly or masses felt. Normal bowel sounds heard. Central nervous system: Alert and oriented. No focal neurological deficits. Extremities: Symmetric 5 x 5 power. Skin: No rashes, lesions or ulcers. Psychiatry: Judgement and insight appear UTD. Mood & affect appropriate.   Data Reviewed: I have personally reviewed following labs and imaging studies  CBC: Recent Labs  Lab 11/23/21 1117  WBC 21.2*  HGB 14.5  HCT 42.3  MCV 90.2  PLT 542    Basic Metabolic Panel: Recent Labs  Lab 11/23/21 1440 11/23/21 2146 11/24/21 0228 11/24/21 7062 11/25/21 0422  NA 131* 140 143 144 138  K 4.8 4.0 3.7 4.8 4.5  CL 90* 105 105 105 100  CO2 10* 19* '25 29 30  '$ GLUCOSE 865* 210* 200* 187* 264*  BUN 55* 39* 41* 38* 28*  CREATININE 2.80* 1.59* 1.75* 1.62* 1.52*  CALCIUM 9.2 9.4 9.9 9.9 9.3  MG  --   --   --   --  1.9  PHOS  --   --  1.6*  --  2.9    CBG: Recent Labs  Lab 11/24/21 1608 11/24/21 2115 11/25/21 0243 11/25/21 0741 11/25/21 1136  GLUCAP 306* 235* 239* 258* 220*    Recent Results (from the past 240 hour(s))  Resp Panel by RT-PCR (Flu A&B, Covid) Anterior Nasal Swab     Status: None   Collection Time: 11/23/21 12:20 PM   Specimen:  Anterior Nasal Swab  Result Value Ref Range Status   SARS Coronavirus 2 by RT PCR NEGATIVE NEGATIVE Final    Comment: (NOTE) SARS-CoV-2 target nucleic acids are NOT DETECTED.  The SARS-CoV-2 RNA is generally detectable in upper respiratory specimens during the acute phase of infection. The lowest concentration of SARS-CoV-2 viral copies this assay can detect is 138 copies/mL. A negative result does not preclude SARS-Cov-2 infection and should not be used as the sole basis for treatment or other patient management decisions. A negative result may occur with  improper specimen collection/handling, submission of specimen other than nasopharyngeal swab, presence of viral mutation(s) within the areas targeted by this assay, and inadequate number of viral copies(<138 copies/mL). A negative result must be combined with clinical observations, patient history, and epidemiological information. The expected result is Negative.  Fact Sheet for Patients:  EntrepreneurPulse.com.au  Fact Sheet for Healthcare Providers:  IncredibleEmployment.be  This test is no t yet approved or cleared by the [place] FDA and  has been authorized for detection and/or diagnosis of SARS-CoV-2 by FDA under an Emergency Use Authorization (EUA). This EUA will remain  in effect (meaning this test can be used) for the duration of the COVID-19 declaration under Section 564(b)(1) of the Act, 21 U.S.C.section 360bbb-3(b)(1), unless the authorization is terminated  or revoked sooner.       Influenza A by PCR NEGATIVE NEGATIVE Final   Influenza B by PCR NEGATIVE NEGATIVE Final    Comment: (NOTE) The Xpert Xpress SARS-CoV-2/FLU/RSV plus assay is intended as an aid in the diagnosis of influenza from Nasopharyngeal swab specimens and should not be used as a sole basis for treatment. Nasal washings and aspirates are unacceptable for Xpert Xpress SARS-CoV-2/FLU/RSV testing.  Fact  Sheet for Patients: EntrepreneurPulse.com.au  Fact Sheet for Healthcare Providers: IncredibleEmployment.be  This test is not yet approved or cleared by the [place] FDA and has been authorized for detection and/or diagnosis of SARS-CoV-2 by FDA under an Emergency Use Authorization (EUA). This EUA will remain in effect (meaning this test can be used) for the duration of the COVID-19 declaration under Section 564(b)(1) of the Act, 21 U.S.C. section 360bbb-3(b)(1), unless the authorization is terminated or revoked.  Performed at [facility], [address], [place], [place] [address]   MRSA Next Gen by PCR, Nasal     Status: None   Collection Time: 11/23/21  3:46 PM   Specimen: Nasal Mucosa; Nasal Swab  Result Value Ref Range Status   MRSA by PCR Next Gen NOT DETECTED NOT DETECTED Final    Comment: (NOTE) The GeneXpert MRSA Assay (FDA approved for NASAL specimens only), is  one component of a comprehensive MRSA colonization surveillance program. It is not intended to diagnose MRSA infection nor to guide or monitor treatment for MRSA infections. Test performance is not FDA approved in patients less than 71 years old. Performed at [facility], [address], [place], [place] [address]      Radiology Studies: No results found.  Scheduled Meds:  amLODipine  10 mg Oral Daily   aspirin EC  81 mg Oral Q breakfast   Chlorhexidine Gluconate Cloth  6 each Topical Daily   dicyclomine  10 mg Oral Once   enoxaparin (LOVENOX) injection  40 mg Subcutaneous Q24H   insulin aspart  0-20 Units Subcutaneous TID WC   insulin aspart  0-5 Units Subcutaneous QHS   insulin aspart  20 Units Subcutaneous TID WC   insulin glargine-yfgn  70 Units Subcutaneous Q24H   metoprolol succinate  100 mg Oral Daily   multivitamin with minerals  1 tablet Oral Daily   pantoprazole (PROTONIX) IV  40 mg Intravenous Q12H   rosuvastatin  40 mg Oral Daily   sucralfate  1 g Oral  TID WC & HS   Continuous Infusions:  lactated ringers 65 mL/hr at 11/25/21 0524     LOS: 2 days    Irwin Brakeman, MD How to contact the [facility] Attending or Consulting provider [place] or covering provider during after hours [place], for this patient?  Check the care team in [facility] and look for a) attending/consulting [facility] provider listed and b) the [facility] team listed Log into [email or web address] and use [place]'s universal password to access. If you do not have the password, please contact the hospital operator. Locate the [facility] provider you are looking for under [facility] and page to a number that you can be directly reached. If you still have difficulty reaching the provider, please page the [facility] (Director on Call) for the Hospitalists listed on amion for assistance.  11/25/2021, 4:37 PM

## 2021-11-26 ENCOUNTER — Telehealth: Payer: Self-pay | Admitting: Gastroenterology

## 2021-11-26 DIAGNOSIS — E1111 Type 2 diabetes mellitus with ketoacidosis with coma: Secondary | ICD-10-CM

## 2021-11-26 DIAGNOSIS — I10 Essential (primary) hypertension: Secondary | ICD-10-CM | POA: Diagnosis not present

## 2021-11-26 DIAGNOSIS — K219 Gastro-esophageal reflux disease without esophagitis: Secondary | ICD-10-CM | POA: Diagnosis not present

## 2021-11-26 DIAGNOSIS — E1169 Type 2 diabetes mellitus with other specified complication: Secondary | ICD-10-CM | POA: Diagnosis not present

## 2021-11-26 LAB — GLUCOSE, CAPILLARY
Glucose-Capillary: 124 mg/dL — ABNORMAL HIGH (ref 70–99)
Glucose-Capillary: 189 mg/dL — ABNORMAL HIGH (ref 70–99)
Glucose-Capillary: 196 mg/dL — ABNORMAL HIGH (ref 70–99)

## 2021-11-26 MED ORDER — PANTOPRAZOLE SODIUM 40 MG PO TBEC: 40.0000 mg | DELAYED_RELEASE_TABLET | Freq: Two times a day (BID) | ORAL | 2 refills | Status: DC

## 2021-11-26 MED ORDER — SUCRALFATE 1 G PO TABS: 1.0000 g | ORAL_TABLET | Freq: Three times a day (TID) | ORAL | 0 refills | Status: DC

## 2021-11-26 MED ORDER — HUMULIN R U-500 KWIKPEN 500 UNIT/ML ~~LOC~~ SOPN
20.0000 [IU] | PEN_INJECTOR | Freq: Three times a day (TID) | SUBCUTANEOUS | 3 refills | Status: DC
Start: 2021-11-27 — End: 2021-12-06

## 2021-11-26 MED ORDER — INSULIN ASPART 100 UNIT/ML IJ SOLN
18.0000 [IU] | Freq: Three times a day (TID) | INTRAMUSCULAR | Status: DC
  Administered 2021-11-26 (×2): 18 [IU] via SUBCUTANEOUS

## 2021-11-26 MED ORDER — INSULIN GLARGINE-YFGN 100 UNIT/ML ~~LOC~~ SOLN
65.0000 [IU] | SUBCUTANEOUS | Status: DC
  Administered 2021-11-26: 65 [IU] via SUBCUTANEOUS
  Filled 2021-11-26 (×2): qty 0.65

## 2021-11-26 NOTE — Progress Notes (Signed)
Gastroenterology Progress Note   Referring Provider: No ref. provider found Primary Care Physician:  Kathyrn Drown, MD Primary Gastroenterologist:  Dr. Gala Romney (previously Dr. Laural Golden)  Patient ID: Terry [name]; [id number]; 1960-11-12    Subjective   Doing well. Voice has returned and significant improvement in nausea and has not had any vomiting. Denies melena, hematochezia, abdominal pain. Reported previously he had some issues feeling like meat was getting stuck when his other symptoms occurred. Feeling well and ready to go home. He reports significant improvement in the last 1.5 days. Has had a bowel movement today. Tolerated breakfast well this morning.    Objective   Vital signs in last 24 hours Temp:  [97.8 F (36.6 C)-99.1 F (37.3 C)] 98.8 F (37.1 C) (09/01 0441) Pulse Rate:  [74-99] 85 (09/01 0441) Resp:  [18-20] 18 (08/31 2118) BP: (132-184)/(74-89) 150/85 (09/01 0441) SpO2:  [96 %-98 %] 98 % (09/01 0441) Last BM Date : 11/26/21  Physical Exam General:   [name] and oriented, pleasant Head:  Normocephalic and atraumatic. Eyes:  No icterus, sclera clear. Conjuctiva pink.  Mouth:  Without lesions, mucosa pink and moist.  Heart:  S1, S2 present, no murmurs noted.  Lungs: Clear to auscultation bilaterally, without wheezing, rales, or rhonchi.  Abdomen:  Bowel sounds present, soft, non-tender, non-distended. No HSM or hernias noted. No rebound or guarding. No masses appreciated  Extremities:  Without clubbing or edema. Neurologic:  [name] and  oriented x4;  grossly normal neurologically. Skin:  Warm and dry, intact without significant lesions.  Psych:  [name] and cooperative. Normal mood and affect.  Intake/Output from previous day: 08/31 0701 - 09/01 0700 In: 720 [P.O.:720] Out: 1251 [Urine:1250; Stool:1] Intake/Output this shift: No intake/output data recorded.  Lab Results  Recent Labs    11/23/21 1117  WBC 21.2*  HGB 14.5  HCT 42.3  PLT 224    BMET Recent Labs    11/24/21 0228 11/24/21 0814 11/25/21 0422  NA 143 144 138  K 3.7 4.8 4.5  CL 105 105 100  CO2 '25 29 30  '$ GLUCOSE 200* 187* 264*  BUN 41* 38* 28*  CREATININE 1.75* 1.62* 1.52*  CALCIUM 9.9 9.9 9.3   LFT Recent Labs    11/24/21 0228  ALBUMIN 4.4   PT/INR No results for input(s): "LABPROT", "INR" in the last 72 hours. Hepatitis Panel No results for input(s): "HEPBSAG", "HCVAB", "HEPAIGM", "HEPBIGM" in the last 72 hours.   Studies/Results DG Abd 2 Views  Result Date: 11/23/2021 CLINICAL DATA:  Vomiting and abdominal pain. EXAM: ABDOMEN - 2 VIEW COMPARISON:  None Available. FINDINGS: Overall bowel gas pattern is nonobstructive. No evidence of soft tissue mass or abnormal fluid collection. Evidence of free intraperitoneal air. No evidence of renal or ureteral stone is seen. No acute-appearing osseous abnormality. Lung bases appear clear. IMPRESSION: Negative.  Nonobstructive bowel gas pattern. Electronically Signed   By: Franki Cabot M.D.   On: 11/23/2021 16:39   DG Chest Portable 1 View  Result Date: 11/23/2021 CLINICAL DATA:  Vomiting, dyspnea. EXAM: PORTABLE CHEST 1 VIEW COMPARISON:  April 06, 2019. FINDINGS: The heart size and mediastinal contours are within normal limits. Both lungs are clear. The visualized skeletal structures are unremarkable. IMPRESSION: No active disease. Electronically Signed   By: Marijo Conception M.D.   On: 11/23/2021 11:47    Assessment  61 y.o. male with a history of diabetes, GERD, HTN, HLD who presented to the ED with intractable nausea/vomiting, lip swelling and  found to have acute kidney injury/dehydration and DKA.  GI consulted for severe stomatitis and gastroparesis from Trulicity.  Intractable vomiting/lip swelling: Symptoms started with escalation of Trulicity dose.  Is suspected that he has developed angioedema of the lips and intractable vomiting likely due to drug-induced gastroparesis.  Is likely that his repetitive  vomiting has caused reflux esophagitis with associated odynophagia.  He was started on pantoprazole IV twice daily with the addition of Carafate 1 g 3 times a day before meals and at bedtime.  He reported yesterday that he did not have any symptoms like this prior to increasing his Trulicity dose.    Plan / Recommendations  Continue pantoprazole 40 mg twice daily Continue Carafate 1 g 3 times a day before meals and at bedtime for 4 weeks. Continue to avoid carbonated beverages Continue soft diet Continue supportive measures Endoscopic evaluation only if symptoms worsen and unable to tolerate p.o. as it would likely not change current management. Follow up in 3-4 weeks outpatient.   Appears to be stable for discharge from a GI standpoint.     LOS: 3 days    11/26/2021, 11:09 AM   Venetia Night, MSN, FNP-BC, AGACNP-BC [facility]

## 2021-11-26 NOTE — Discharge Instructions (Signed)
IMPORTANT INFORMATION: PAY CLOSE ATTENTION   PHYSICIAN DISCHARGE INSTRUCTIONS  Follow with Primary care provider  Luking, Scott A, MD  and other consultants as instructed by your Hospitalist Physician  SEEK MEDICAL CARE OR RETURN TO EMERGENCY ROOM IF SYMPTOMS COME BACK, WORSEN OR NEW PROBLEM DEVELOPS   Please note: You were cared for by a hospitalist during your hospital stay. Every effort will be made to forward records to your primary care provider.  You can request that your primary care provider send for your hospital records if they have not received them.  Once you are discharged, your primary care physician will handle any further medical issues. Please note that NO REFILLS for any discharge medications will be authorized once you are discharged, as it is imperative that you return to your primary care physician (or establish a relationship with a primary care physician if you do not have one) for your post hospital discharge needs so that they can reassess your need for medications and monitor your lab values.  Please get a complete blood count and chemistry panel checked by your Primary MD at your next visit, and again as instructed by your Primary MD.  Get Medicines reviewed and adjusted: Please take all your medications with you for your next visit with your Primary MD  Laboratory/radiological data: Please request your Primary MD to go over all hospital tests and procedure/radiological results at the follow up, please ask your primary care provider to get all Hospital records sent to his/her office.  In some cases, they will be blood work, cultures and biopsy results pending at the time of your discharge. Please request that your primary care provider follow up on these results.  If you are diabetic, please bring your blood sugar readings with you to your follow up appointment with primary care.    Please call and make your follow up appointments as soon as possible.    Also Note  the following: If you experience worsening of your admission symptoms, develop shortness of breath, life threatening emergency, suicidal or homicidal thoughts you must seek medical attention immediately by calling 911 or calling your MD immediately  if symptoms less severe.  You must read complete instructions/literature along with all the possible adverse reactions/side effects for all the Medicines you take and that have been prescribed to you. Take any new Medicines after you have completely understood and accpet all the possible adverse reactions/side effects.   Do not drive when taking Pain medications or sleeping medications (Benzodiazepines)  Do not take more than prescribed Pain, Sleep and Anxiety Medications. It is not advisable to combine anxiety,sleep and pain medications without talking with your primary care practitioner  Special Instructions: If you have smoked or chewed Tobacco  in the last 2 yrs please stop smoking, stop any regular Alcohol  and or any Recreational drug use.  Wear Seat belts while driving.  Do not drive if taking any narcotic, mind altering or controlled substances or recreational drugs or alcohol.       

## 2021-11-26 NOTE — Discharge Summary (Signed)
Physician Discharge Summary  Terry [name]:[id number] DOB: 1961-02-21 DOA: 11/23/2021  PCP: Kathyrn Drown, MD Endocrine: Marinus Maw, NP  Admit date: 11/23/2021 Discharge date: 11/26/2021  Admitted From:  HOME  Disposition: HOME   Recommendations for Outpatient Follow-up:  Follow up with PCP in 1 weeks Follow up with Endocrinology in 1-2 weeks Please monitor blood glucose closely and titrate U500 as needed for better glycemic control PLEASE AVOID RESTARTING TRULICITY DUE TO SEVERE ADVERSE REACTION  Discharge Condition: STABLE   CODE STATUS: Full  DIET: soft (carb modified heart healthy)   Brief Hospitalization Summary: Please see all hospital notes, images, labs for full details of the hospitalization. Brief Admission History:  61 y.o. male with history of diabetes mellitus type 2, HTN, HLD, history of transient A-fib/SVT in the setting of DKA and COVID infection previously and GERD who presents to the ED with several days of fatigue malaise and generalized weakness and was admitted with severe DKA.     Assessment and Plan:   DKA - high insulin resistance - his acidosis has corrected - pt was transitioned back to subcutaneous insulin  - while NPO on IV insulin he was requiring about 4 units per hour - plan to transition to basal bolus supplemental insulin  - DC dextrose infusion - monitor CBG 5x per day  - DC  home, resume U500 insulin 9/2 reduced to 20 units TID with meals, follow up with PCP and endocrine   AKI  - prerenal from severe dehydration - improving with IV fluid - treated with LR solution   Transient Afib RVR/SVT - resumed home metoprolol XL 100 mg daily  - he has been on ASA for CHADVASC score of 2    Leukocytosis - reactive - WBC trending down with treatment of DKA  - suspect reactive to acute DKA illness    Adverse drug reaction with gastroparesis and mucositis - Pt had a severe reaction to trulicity and developed severe gastroparesis - appreciate GI  service with starting him on carafate - pt reports he is getting better everyday  - Trulicity has been discontinued   Nausea and vomiting - resolved  - secondary to DKA  - improving with treatment of DKA    Essential Hypertension  - restarted home amlodipine and metoprolol - restart ARB    GERD - continue protonix therapy for GI protection, IV BID ordered by GI team - DC home on protonix 40 mg BID with carafate (to take for 4 weeks)  Discharge Diagnoses:  Principal Problem:   DKA (diabetic ketoacidosis) ([place]) Active Problems:   Essential hypertension, benign   GERD (gastroesophageal reflux disease)   Hyperlipidemia associated with type 2 diabetes mellitus (HCC)   Intractable nausea and vomiting  Discharge Instructions:  Allergies as of 11/26/2021       Reactions   Invokana [canagliflozin] Other (See Comments)   laryngitis   Altace [ramipril] Cough   Metformin And Related Diarrhea        Medication List     STOP taking these medications    mometasone 0.1 % cream Commonly known as: ELOCON   Trulicity 1.5 NF/6.2ZH Sopn Generic drug: Dulaglutide   Trulicity 4.5 YQ/6.5HQ Sopn Generic drug: Dulaglutide       TAKE these medications    amLODipine 10 MG tablet Commonly known as: NORVASC Take 1 tablet (10 mg total) by mouth daily.   ascorbic acid 500 MG tablet Commonly known as: VITAMIN C Take 1 tablet (500 mg total) by mouth daily.  aspirin EC 81 MG tablet Take 1 tablet (81 mg total) by mouth daily with breakfast.   Dexcom G6 Sensor Misc APPLY 1 SENSOR EVERY 10 DAYS FOR GLUCOSE MONITORING.   Dexcom G6 Transmitter Misc USE AS DIRECTED.   fenofibrate 160 MG tablet TAKE 1 TABLET BY MOUTH ONCE A DAY.   HumuLIN R U-500 KwikPen 500 UNIT/ML KwikPen Generic drug: insulin regular human CONCENTRATED Inject 20 Units into the skin 3 (three) times daily with meals. OR AS DIRECTED BY PHYSICIAN Start taking on: November 27, 2021 What changed:  how much to  take additional instructions   indapamide 2.5 MG tablet Commonly known as: LOZOL TAKE (1) TABLET BY MOUTH ONCE DAILY.   metoprolol succinate 100 MG 24 hr tablet Commonly known as: TOPROL-XL TAKE (1) TABLET BY MOUTH ONCE DAILY.   multivitamin tablet Take 1 tablet by mouth daily.   oxyCODONE-acetaminophen 10-325 MG tablet Commonly known as: PERCOCET 1 taken 4 times daily as needed for pain What changed: Another medication with the same name was removed. Continue taking this medication, and follow the directions you see here.   pantoprazole 40 MG tablet Commonly known as: PROTONIX Take 1 tablet (40 mg total) by mouth 2 (two) times daily. What changed:  medication strength how much to take when to take this   rosuvastatin 40 MG tablet Commonly known as: CRESTOR Take 1 tablet (40 mg total) by mouth daily.   sildenafil 20 MG tablet Commonly known as: REVATIO Take 1-5 tablets before sexual activity as directed. No greater that 5 tablets in a day.   sucralfate 1 g tablet Commonly known as: Carafate Take 1 tablet (1 g total) by mouth 4 (four) times daily -  with meals and at bedtime for 28 days.   TechLite Pen Needles 32G X 4 MM Misc Generic drug: Insulin Pen Needle INJECT 4 TIMES DAILY AS DIRECTED.   triamcinolone cream 0.1 % Commonly known as: KENALOG Apply thin amount to top inner lip twice daily for 10 to 14 days till healed   valsartan 160 MG tablet Commonly known as: DIOVAN Take 1 tablet (160 mg total) by mouth daily.   zinc sulfate 220 (50 Zn) MG capsule Take 1 capsule (220 mg total) by mouth daily.        Follow-up Information     Kathyrn Drown, MD Follow up in 1 week(s).   Specialty: Family Medicine Why: Hospital Follow Up Contact information: [place] [id number] [phone]         Arnoldo Lenis, MD .   Specialty: Cardiology Contact information: [address] [place] [address] [phone]          Brita Romp, NP. Schedule an appointment as soon as possible for a visit in 1 week(s).   Specialty: Nurse Practitioner Why: Hospital Follow Up Contact information: [address]  [place] [address] [phone]                Allergies  Allergen Reactions   Invokana [Canagliflozin] Other (See Comments)    laryngitis   Altace [Ramipril] Cough   Metformin And Related Diarrhea   Allergies as of 11/26/2021       Reactions   Invokana [canagliflozin] Other (See Comments)   laryngitis   Altace [ramipril] Cough   Metformin And Related Diarrhea        Medication List     STOP taking these medications    mometasone 0.1 % cream Commonly  known as: ELOCON   Trulicity 1.5 QQ/2.2LN Sopn Generic drug: Dulaglutide   Trulicity 4.5 LG/9.2JJ Sopn Generic drug: Dulaglutide       TAKE these medications    amLODipine 10 MG tablet Commonly known as: NORVASC Take 1 tablet (10 mg total) by mouth daily.   ascorbic acid 500 MG tablet Commonly known as: VITAMIN C Take 1 tablet (500 mg total) by mouth daily.   aspirin EC 81 MG tablet Take 1 tablet (81 mg total) by mouth daily with breakfast.   Dexcom G6 Sensor Misc APPLY 1 SENSOR EVERY 10 DAYS FOR GLUCOSE MONITORING.   Dexcom G6 Transmitter Misc USE AS DIRECTED.   fenofibrate 160 MG tablet TAKE 1 TABLET BY MOUTH ONCE A DAY.   HumuLIN R U-500 KwikPen 500 UNIT/ML KwikPen Generic drug: insulin regular human CONCENTRATED Inject 20 Units into the skin 3 (three) times daily with meals. OR AS DIRECTED BY PHYSICIAN Start taking on: November 27, 2021 What changed:  how much to take additional instructions   indapamide 2.5 MG tablet Commonly known as: LOZOL TAKE (1) TABLET BY MOUTH ONCE DAILY.   metoprolol succinate 100 MG 24 hr tablet Commonly known as: TOPROL-XL TAKE (1) TABLET BY MOUTH ONCE DAILY.   multivitamin tablet Take 1 tablet by mouth daily.   oxyCODONE-acetaminophen 10-325 MG  tablet Commonly known as: PERCOCET 1 taken 4 times daily as needed for pain What changed: Another medication with the same name was removed. Continue taking this medication, and follow the directions you see here.   pantoprazole 40 MG tablet Commonly known as: PROTONIX Take 1 tablet (40 mg total) by mouth 2 (two) times daily. What changed:  medication strength how much to take when to take this   rosuvastatin 40 MG tablet Commonly known as: CRESTOR Take 1 tablet (40 mg total) by mouth daily.   sildenafil 20 MG tablet Commonly known as: REVATIO Take 1-5 tablets before sexual activity as directed. No greater that 5 tablets in a day.   sucralfate 1 g tablet Commonly known as: Carafate Take 1 tablet (1 g total) by mouth 4 (four) times daily -  with meals and at bedtime for 28 days.   TechLite Pen Needles 32G X 4 MM Misc Generic drug: Insulin Pen Needle INJECT 4 TIMES DAILY AS DIRECTED.   triamcinolone cream 0.1 % Commonly known as: KENALOG Apply thin amount to top inner lip twice daily for 10 to 14 days till healed   valsartan 160 MG tablet Commonly known as: DIOVAN Take 1 tablet (160 mg total) by mouth daily.   zinc sulfate 220 (50 Zn) MG capsule Take 1 capsule (220 mg total) by mouth daily.        Procedures/Studies: DG Abd 2 Views  Result Date: 11/23/2021 CLINICAL DATA:  Vomiting and abdominal pain. EXAM: ABDOMEN - 2 VIEW COMPARISON:  None Available. FINDINGS: Overall bowel gas pattern is nonobstructive. No evidence of soft tissue mass or abnormal fluid collection. Evidence of free intraperitoneal air. No evidence of renal or ureteral stone is seen. No acute-appearing osseous abnormality. Lung bases appear clear. IMPRESSION: Negative.  Nonobstructive bowel gas pattern. Electronically Signed   By: Franki Cabot M.D.   On: 11/23/2021 16:39   DG Chest Portable 1 View  Result Date: 11/23/2021 CLINICAL DATA:  Vomiting, dyspnea. EXAM: PORTABLE CHEST 1 VIEW COMPARISON:   April 06, 2019. FINDINGS: The heart size and mediastinal contours are within normal limits. Both lungs are clear. The visualized skeletal structures are unremarkable. IMPRESSION: No  active disease. Electronically Signed   By: Marijo Conception M.D.   On: 11/23/2021 11:47     Subjective: Pt reports tolerating diet much better now.  Eating and drinking.    Discharge Exam: Vitals:   11/25/21 2118 11/26/21 0441  BP: 132/82 (!) 150/85  Pulse: 74 85  Resp: 18   Temp: 99.1 F (37.3 C) 98.8 F (37.1 C)  SpO2: 97% 98%   Vitals:   11/25/21 1300 11/25/21 1545 11/25/21 2118 11/26/21 0441  BP: (!) 142/74 (!) 150/89 132/82 (!) 150/85  Pulse: 96 94 74 85  Resp: '18 18 18   '$ Temp: 97.8 F (36.6 C) 98.2 F (36.8 C) 99.1 F (37.3 C) 98.8 F (37.1 C)  TempSrc: Oral Oral Oral   SpO2: 97% 96% 97% 98%  Weight:      Height:       General: Pt is alert, awake, not in acute distress Cardiovascular: normal S1/S2 +, no rubs, no gallops Respiratory: CTA bilaterally, no wheezing, no rhonchi Abdominal: Soft, NT, ND, bowel sounds + Extremities: no edema, no cyanosis   The results of significant diagnostics from this hospitalization (including imaging, microbiology, ancillary and laboratory) are listed below for reference.     Microbiology: Recent Results (from the past 240 hour(s))  Resp Panel by RT-PCR (Flu A&B, Covid) Anterior Nasal Swab     Status: None   Collection Time: 11/23/21 12:20 PM   Specimen: Anterior Nasal Swab  Result Value Ref Range Status   SARS Coronavirus 2 by RT PCR NEGATIVE NEGATIVE Final    Comment: (NOTE) SARS-CoV-2 target nucleic acids are NOT DETECTED.  The SARS-CoV-2 RNA is generally detectable in upper respiratory specimens during the acute phase of infection. The lowest concentration of SARS-CoV-2 viral copies this assay can detect is 138 copies/mL. A negative result does not preclude SARS-Cov-2 infection and should not be used as the sole basis for treatment or other  patient management decisions. A negative result may occur with  improper specimen collection/handling, submission of specimen other than nasopharyngeal swab, presence of viral mutation(s) within the areas targeted by this assay, and inadequate number of viral copies(<138 copies/mL). A negative result must be combined with clinical observations, patient history, and epidemiological information. The expected result is Negative.  Fact Sheet for Patients:  EntrepreneurPulse.com.au  Fact Sheet for Healthcare Providers:  IncredibleEmployment.be  This test is no t yet approved or cleared by the [place] FDA and  has been authorized for detection and/or diagnosis of SARS-CoV-2 by FDA under an Emergency Use Authorization (EUA). This EUA will remain  in effect (meaning this test can be used) for the duration of the COVID-19 declaration under Section 564(b)(1) of the Act, 21 U.S.C.section 360bbb-3(b)(1), unless the authorization is terminated  or revoked sooner.       Influenza A by PCR NEGATIVE NEGATIVE Final   Influenza B by PCR NEGATIVE NEGATIVE Final    Comment: (NOTE) The Xpert Xpress SARS-CoV-2/FLU/RSV plus assay is intended as an aid in the diagnosis of influenza from Nasopharyngeal swab specimens and should not be used as a sole basis for treatment. Nasal washings and aspirates are unacceptable for Xpert Xpress SARS-CoV-2/FLU/RSV testing.  Fact Sheet for Patients: EntrepreneurPulse.com.au  Fact Sheet for Healthcare Providers: IncredibleEmployment.be  This test is not yet approved or cleared by the [place] FDA and has been authorized for detection and/or diagnosis of SARS-CoV-2 by FDA under an Emergency Use Authorization (EUA). This EUA will remain in effect (meaning this test can be  used) for the duration of the COVID-19 declaration under Section 564(b)(1) of the Act, 21 U.S.C. section  360bbb-3(b)(1), unless the authorization is terminated or revoked.  Performed at [facility], [address], [place], [place] [address]   MRSA Next Gen by PCR, Nasal     Status: None   Collection Time: 11/23/21  3:46 PM   Specimen: Nasal Mucosa; Nasal Swab  Result Value Ref Range Status   MRSA by PCR Next Gen NOT DETECTED NOT DETECTED Final    Comment: (NOTE) The GeneXpert MRSA Assay (FDA approved for NASAL specimens only), is one component of a comprehensive MRSA colonization surveillance program. It is not intended to diagnose MRSA infection nor to guide or monitor treatment for MRSA infections. Test performance is not FDA approved in patients less than 3 years old. Performed at [facility], [address], [place], [place] [address]      Labs: BNP (last 3 results) No results for input(s): "BNP" in the last 8760 hours. Basic Metabolic Panel: Recent Labs  Lab 11/23/21 1440 11/23/21 2146 11/24/21 0228 11/24/21 0814 11/25/21 0422  NA 131* 140 143 144 138  K 4.8 4.0 3.7 4.8 4.5  CL 90* 105 105 105 100  CO2 10* 19* '25 29 30  '$ GLUCOSE 865* 210* 200* 187* 264*  BUN 55* 39* 41* 38* 28*  CREATININE 2.80* 1.59* 1.75* 1.62* 1.52*  CALCIUM 9.2 9.4 9.9 9.9 9.3  MG  --   --   --   --  1.9  PHOS  --   --  1.6*  --  2.9   Liver Function Tests: Recent Labs  Lab 11/24/21 0228  ALBUMIN 4.4   Recent Labs  Lab 11/23/21 1700  LIPASE 22   No results for input(s): "AMMONIA" in the last 168 hours. CBC: Recent Labs  Lab 11/23/21 1117  WBC 21.2*  HGB 14.5  HCT 42.3  MCV 90.2  PLT 224   Cardiac Enzymes: No results for input(s): "CKTOTAL", "CKMB", "CKMBINDEX", "TROPONINI" in the last 168 hours. BNP: Invalid input(s): "POCBNP" CBG: Recent Labs  Lab 11/25/21 2035 11/25/21 2114 11/26/21 0252 11/26/21 0714 11/26/21 1131  GLUCAP 76 157* 124* 189* 196*   D-Dimer No results for input(s): "DDIMER" in the last 72 hours. Hgb A1c No results for input(s): "HGBA1C" in the  last 72 hours. Lipid Profile No results for input(s): "CHOL", "HDL", "LDLCALC", "TRIG", "CHOLHDL", "LDLDIRECT" in the last 72 hours. Thyroid function studies No results for input(s): "TSH", "T4TOTAL", "T3FREE", "THYROIDAB" in the last 72 hours.  Invalid input(s): "FREET3" Anemia work up No results for input(s): "VITAMINB12", "FOLATE", "FERRITIN", "TIBC", "IRON", "RETICCTPCT" in the last 72 hours. Urinalysis    Component Value Date/Time   COLORURINE STRAW (A) 11/23/2021 1354   APPEARANCEUR CLEAR 11/23/2021 1354   LABSPEC 1.021 11/23/2021 1354   PHURINE 5.0 11/23/2021 1354   GLUCOSEU >=500 (A) 11/23/2021 1354   HGBUR LARGE (A) 11/23/2021 1354   BILIRUBINUR NEGATIVE 11/23/2021 1354   KETONESUR 80 (A) 11/23/2021 1354   PROTEINUR NEGATIVE 11/23/2021 1354   NITRITE NEGATIVE 11/23/2021 1354   LEUKOCYTESUR NEGATIVE 11/23/2021 1354   Sepsis Labs Recent Labs  Lab 11/23/21 1117  WBC 21.2*   Microbiology Recent Results (from the past 240 hour(s))  Resp Panel by RT-PCR (Flu A&B, Covid) Anterior Nasal Swab     Status: None   Collection Time: 11/23/21 12:20 PM   Specimen: Anterior Nasal Swab  Result Value Ref Range Status   SARS Coronavirus 2 by RT PCR NEGATIVE  NEGATIVE Final    Comment: (NOTE) SARS-CoV-2 target nucleic acids are NOT DETECTED.  The SARS-CoV-2 RNA is generally detectable in upper respiratory specimens during the acute phase of infection. The lowest concentration of SARS-CoV-2 viral copies this assay can detect is 138 copies/mL. A negative result does not preclude SARS-Cov-2 infection and should not be used as the sole basis for treatment or other patient management decisions. A negative result may occur with  improper specimen collection/handling, submission of specimen other than nasopharyngeal swab, presence of viral mutation(s) within the areas targeted by this assay, and inadequate number of viral copies(<138 copies/mL). A negative result must be combined  with clinical observations, patient history, and epidemiological information. The expected result is Negative.  Fact Sheet for Patients:  EntrepreneurPulse.com.au  Fact Sheet for Healthcare Providers:  IncredibleEmployment.be  This test is no t yet approved or cleared by the [place] FDA and  has been authorized for detection and/or diagnosis of SARS-CoV-2 by FDA under an Emergency Use Authorization (EUA). This EUA will remain  in effect (meaning this test can be used) for the duration of the COVID-19 declaration under Section 564(b)(1) of the Act, 21 U.S.C.section 360bbb-3(b)(1), unless the authorization is terminated  or revoked sooner.       Influenza A by PCR NEGATIVE NEGATIVE Final   Influenza B by PCR NEGATIVE NEGATIVE Final    Comment: (NOTE) The Xpert Xpress SARS-CoV-2/FLU/RSV plus assay is intended as an aid in the diagnosis of influenza from Nasopharyngeal swab specimens and should not be used as a sole basis for treatment. Nasal washings and aspirates are unacceptable for Xpert Xpress SARS-CoV-2/FLU/RSV testing.  Fact Sheet for Patients: EntrepreneurPulse.com.au  Fact Sheet for Healthcare Providers: IncredibleEmployment.be  This test is not yet approved or cleared by the [place] FDA and has been authorized for detection and/or diagnosis of SARS-CoV-2 by FDA under an Emergency Use Authorization (EUA). This EUA will remain in effect (meaning this test can be used) for the duration of the COVID-19 declaration under Section 564(b)(1) of the Act, 21 U.S.C. section 360bbb-3(b)(1), unless the authorization is terminated or revoked.  Performed at [facility], [address], [place], [place] [address]   MRSA Next Gen by PCR, Nasal     Status: None   Collection Time: 11/23/21  3:46 PM   Specimen: Nasal Mucosa; Nasal Swab  Result Value Ref Range Status   MRSA by PCR Next Gen NOT DETECTED  NOT DETECTED Final    Comment: (NOTE) The GeneXpert MRSA Assay (FDA approved for NASAL specimens only), is one component of a comprehensive MRSA colonization surveillance program. It is not intended to diagnose MRSA infection nor to guide or monitor treatment for MRSA infections. Test performance is not FDA approved in patients less than 57 years old. Performed at [facility], [address], [place], [place] [address]     Time coordinating discharge: 38 mins   SIGNED:  Irwin Brakeman, MD  [facility] 11/26/2021, 12:25 PM How to contact the [facility] Attending or Consulting provider [place] or covering provider during after hours [place], for this patient?  Check the care team in [facility] and look for a) attending/consulting [facility] provider listed and b) the [facility] team listed Log into [email or web address] and use [place]'s universal password to access. If you do not have the password, please contact the hospital operator. Locate the [facility] provider you are looking for under [facility] and page to a number that you can be directly reached. If you  still have difficulty reaching the provider, please page the [facility] (Director on Call) for the Hospitalists listed on amion for assistance.

## 2021-11-26 NOTE — Telephone Encounter (Signed)
Please arrange hospital follow up in 3-4 weeks with LSL, RMR preferably as they seen him for consultation or myself if they do not have availability.   Venetia Night, MSN, APRN, FNP-BC, AGACNP-BC [facility]

## 2021-11-26 NOTE — Evaluation (Signed)
Physical Therapy Evaluation Patient Details Name: Terry [name] MRN: [id number] DOB: [birth year] Today's Date: 11/26/2021  History of Present Illness  Terry [name]  is a 61 y.o. male with history of diabetes mellitus type 2, HTN, HLD, history of transient A-fib/SVT in the setting of DKA and COVID infection previously and GERD who presents to the ED with several days of fatigue malaise and generalized weakness   Clinical Impression  Patient now functioning at baseline for mobility. Patient is independent with all mobility and ambulated in room without assist or AD. Patient educated on getting referral to outpatient PT should any issues arise in the future. Patient does not require additional PT services at this time. Patient discharged to care of nursing for ambulation daily as tolerated for length of stay.      Recommendations for follow up therapy are one component of a multi-disciplinary discharge planning process, led by the attending physician.  Recommendations may be updated based on patient status, additional functional criteria and insurance authorization.  Follow Up Recommendations No PT follow up      Assistance Recommended at Discharge None  Patient can return home with the following       Equipment Recommendations None recommended by PT  Recommendations for Other Services       Functional Status Assessment Patient has had a recent decline in their functional status and demonstrates the ability to make significant improvements in function in a reasonable and predictable amount of time.     Precautions / Restrictions Precautions Precautions: None Restrictions Weight Bearing Restrictions: No      Mobility  Bed Mobility Overal bed mobility: Independent                  Transfers Overall transfer level: Independent Equipment used: None                    Ambulation/Gait Ambulation/Gait assistance: Independent Gait Distance (Feet): 20 Feet Assistive  device: None Gait Pattern/deviations: WFL(Within Functional Limits)          Stairs            Wheelchair Mobility    Modified Rankin (Stroke Patients Only)       Balance Overall balance assessment: Independent                                           Pertinent Vitals/Pain Pain Assessment Pain Assessment: No/denies pain    Home Living Family/patient expects to be discharged to:: Private residence Living Arrangements: Spouse/significant other Available Help at Discharge: Family Type of Home: House Home Access: Stairs to enter Entrance Stairs-Rails: None Technical brewer of Steps: 4   Home Layout: One level Home Equipment: None      Prior Function Prior Level of Function : Independent/Modified Independent             Mobility Comments: community ambulation without AD ADLs Comments: Independent     Hand Dominance        Extremity/Trunk Assessment   Upper Extremity Assessment Upper Extremity Assessment: Overall WFL for tasks assessed    Lower Extremity Assessment Lower Extremity Assessment: Overall WFL for tasks assessed    Cervical / Trunk Assessment Cervical / Trunk Assessment: Normal  Communication   Communication: No difficulties  Cognition Arousal/Alertness: Awake/alert Behavior During Therapy: WFL for tasks assessed/performed Overall Cognitive Status: Within Functional Limits for tasks assessed  General Comments      Exercises     Assessment/Plan    PT Assessment Patient does not need any further PT services  PT Problem List         PT Treatment Interventions      PT Goals (Current goals can be found in the Care Plan section)  Acute Rehab PT Goals Patient Stated Goal: return home PT Goal Formulation: With patient Time For Goal Achievement: 11/26/21 Potential to Achieve Goals: Good    Frequency       Co-evaluation                AM-PAC PT "6 Clicks" Mobility  Outcome Measure Help needed turning from your back to your side while in a flat bed without using bedrails?: None Help needed moving from lying on your back to sitting on the side of a flat bed without using bedrails?: None Help needed moving to and from a bed to a chair (including a wheelchair)?: None Help needed standing up from a chair using your arms (e.g., wheelchair or bedside chair)?: None Help needed to walk in hospital room?: None Help needed climbing 3-5 steps with a railing? : None 6 Click Score: 24    End of Session   Activity Tolerance: Patient tolerated treatment well Patient left: in bed;with family/visitor present Nurse Communication: Mobility status PT Visit Diagnosis: Other abnormalities of gait and mobility (R26.89)    Time: 1005-1009 PT Time Calculation (min) (ACUTE ONLY): 4 min   Charges:   PT Evaluation $PT Eval Low Complexity: 1 Low          11:37 AM, 11/26/21 Mearl Latin PT, DPT Physical Therapist at [facility]

## 2021-11-26 NOTE — Progress Notes (Signed)
PROGRESS NOTE   Terry [name]  YFV:[id number] DOB: 02-26-1961 DOA: 11/23/2021 PCP: Kathyrn Drown, MD   Chief Complaint  Patient presents with   Hyperglycemia   Level of care: Med-Surg  Brief Admission History:  61 y.o. male with history of diabetes mellitus type 2, HTN, HLD, history of transient A-fib/SVT in the setting of DKA and COVID infection previously and GERD who presents to the ED with several days of fatigue malaise and generalized weakness and was admitted with severe DKA.     Assessment and Plan:  DKA - high insulin resistance - his acidosis has corrected - pt was transitioned back to subcutaneous insulin  - while NPO on IV insulin he was requiring about 4 units per hour - plan to transition to basal bolus supplemental insulin  - DC dextrose infusion - monitor CBG 5x per day   AKI  - prerenal from severe dehydration - improving with IV fluid - continue LR  Transient Afib RVR/SVT - resumed home metoprolol XL 100 mg daily  - he has been on ASA for CHADVASC score of 2   Leukocytosis - reactive - WBC trending down with treatment of DKA  - suspect reactive to acute DKA illness   Adverse drug reaction with gastroparesis and mucositis - Pt had a severe reaction to trulicity and developed severe gastroparesis - appreciate GI service with starting him on carafate - pt reports he is getting better everyday  - Trulicity has been discontinued  Nausea and vomiting - resolved  - secondary to DKA  - improving with treatment of DKA   Essential Hypertension  - restarted home amlodipine and metoprolol - hopefully can restart ARB soon as renal function improves  GERD - continue protonix therapy for GI protection, IV BID ordered by GI team  DVT prophylaxis: enoxaparin  Code Status: FULL   Family Communication:  Disposition: Status is: Inpatient Remains inpatient appropriate because: Intensity    Consultants:  N/a  Procedures:  N/a  Antimicrobials:  N/a    Subjective: Pt says he is having less pain with swallowing and able to eat more now    Objective: Vitals:   11/25/21 1300 11/25/21 1545 11/25/21 2118 11/26/21 0441  BP: (!) 142/74 (!) 150/89 132/82 (!) 150/85  Pulse: 96 94 74 85  Resp: '18 18 18   '$ Temp: 97.8 F (36.6 C) 98.2 F (36.8 C) 99.1 F (37.3 C) 98.8 F (37.1 C)  TempSrc: Oral Oral Oral   SpO2: 97% 96% 97% 98%  Weight:      Height:        Intake/Output Summary (Last 24 hours) at 11/26/2021 1137 Last data filed at 11/26/2021 0700 Gross per 24 hour  Intake 720 ml  Output 1251 ml  Net -531 ml   Filed Weights   11/23/21 1113  Weight: 86.2 kg   Examination:  General exam: mucosal stomatitis and black areas on lower lips improving, chronically ill appearing, Appears calm and comfortable  Respiratory system: Clear to auscultation. Respiratory effort normal. Cardiovascular system: normal S1 & S2 heard. No JVD, murmurs, rubs, gallops or clicks. No pedal edema. Gastrointestinal system: Abdomen is nondistended, soft and nontender. No organomegaly or masses felt. Normal bowel sounds heard. Central nervous system: Alert and oriented. No focal neurological deficits. Extremities: Symmetric 5 x 5 power. Skin: No rashes, lesions or ulcers. Psychiatry: Judgement and insight appear UTD. Mood & affect appropriate.   Data Reviewed: I have personally reviewed following labs and imaging studies  CBC: Recent Labs  Lab 11/23/21 1117  WBC 21.2*  HGB 14.5  HCT 42.3  MCV 90.2  PLT 784    Basic Metabolic Panel: Recent Labs  Lab 11/23/21 1440 11/23/21 2146 11/24/21 0228 11/24/21 0814 11/25/21 0422  NA 131* 140 143 144 138  K 4.8 4.0 3.7 4.8 4.5  CL 90* 105 105 105 100  CO2 10* 19* '25 29 30  '$ GLUCOSE 865* 210* 200* 187* 264*  BUN 55* 39* 41* 38* 28*  CREATININE 2.80* 1.59* 1.75* 1.62* 1.52*  CALCIUM 9.2 9.4 9.9 9.9 9.3  MG  --   --   --   --  1.9  PHOS  --   --  1.6*  --  2.9    CBG: Recent Labs  Lab 11/25/21 2035  11/25/21 2114 11/26/21 0252 11/26/21 0714 11/26/21 1131  GLUCAP 76 157* 124* 189* 196*    Recent Results (from the past 240 hour(s))  Resp Panel by RT-PCR (Flu A&B, Covid) Anterior Nasal Swab     Status: None   Collection Time: 11/23/21 12:20 PM   Specimen: Anterior Nasal Swab  Result Value Ref Range Status   SARS Coronavirus 2 by RT PCR NEGATIVE NEGATIVE Final    Comment: (NOTE) SARS-CoV-2 target nucleic acids are NOT DETECTED.  The SARS-CoV-2 RNA is generally detectable in upper respiratory specimens during the acute phase of infection. The lowest concentration of SARS-CoV-2 viral copies this assay can detect is 138 copies/mL. A negative result does not preclude SARS-Cov-2 infection and should not be used as the sole basis for treatment or other patient management decisions. A negative result may occur with  improper specimen collection/handling, submission of specimen other than nasopharyngeal swab, presence of viral mutation(s) within the areas targeted by this assay, and inadequate number of viral copies(<138 copies/mL). A negative result must be combined with clinical observations, patient history, and epidemiological information. The expected result is Negative.  Fact Sheet for Patients:  EntrepreneurPulse.com.au  Fact Sheet for Healthcare Providers:  IncredibleEmployment.be  This test is no t yet approved or cleared by the [place] FDA and  has been authorized for detection and/or diagnosis of SARS-CoV-2 by FDA under an Emergency Use Authorization (EUA). This EUA will remain  in effect (meaning this test can be used) for the duration of the COVID-19 declaration under Section 564(b)(1) of the Act, 21 U.S.C.section 360bbb-3(b)(1), unless the authorization is terminated  or revoked sooner.       Influenza A by PCR NEGATIVE NEGATIVE Final   Influenza B by PCR NEGATIVE NEGATIVE Final    Comment: (NOTE) The Xpert Xpress  SARS-CoV-2/FLU/RSV plus assay is intended as an aid in the diagnosis of influenza from Nasopharyngeal swab specimens and should not be used as a sole basis for treatment. Nasal washings and aspirates are unacceptable for Xpert Xpress SARS-CoV-2/FLU/RSV testing.  Fact Sheet for Patients: EntrepreneurPulse.com.au  Fact Sheet for Healthcare Providers: IncredibleEmployment.be  This test is not yet approved or cleared by the [place] FDA and has been authorized for detection and/or diagnosis of SARS-CoV-2 by FDA under an Emergency Use Authorization (EUA). This EUA will remain in effect (meaning this test can be used) for the duration of the COVID-19 declaration under Section 564(b)(1) of the Act, 21 U.S.C. section 360bbb-3(b)(1), unless the authorization is terminated or revoked.  Performed at [facility], [address], [place], [place] [address]   MRSA Next Gen by PCR, Nasal     Status: None   Collection Time: 11/23/21  3:46 PM  Specimen: Nasal Mucosa; Nasal Swab  Result Value Ref Range Status   MRSA by PCR Next Gen NOT DETECTED NOT DETECTED Final    Comment: (NOTE) The GeneXpert MRSA Assay (FDA approved for NASAL specimens only), is one component of a comprehensive MRSA colonization surveillance program. It is not intended to diagnose MRSA infection nor to guide or monitor treatment for MRSA infections. Test performance is not FDA approved in patients less than 87 years old. Performed at [facility], [address] [place], [place] [address]      Radiology Studies: No results found.  Scheduled Meds:  amLODipine  10 mg Oral Daily   aspirin EC  81 mg Oral Q breakfast   Chlorhexidine Gluconate Cloth  6 each Topical Daily   enoxaparin (LOVENOX) injection  40 mg Subcutaneous Q24H   insulin aspart  0-20 Units Subcutaneous TID WC   insulin aspart  0-5 Units Subcutaneous QHS   insulin aspart  18 Units Subcutaneous TID WC   insulin  glargine-yfgn  65 Units Subcutaneous Q24H   metoprolol succinate  100 mg Oral Daily   metoprolol tartrate  5 mg Intravenous Once   multivitamin with minerals  1 tablet Oral Daily   pantoprazole (PROTONIX) IV  40 mg Intravenous Q12H   rosuvastatin  40 mg Oral Daily   sucralfate  1 g Oral TID WC & HS   Continuous Infusions:  lactated ringers 65 mL/hr at 11/25/21 2131     LOS: 3 days   Irwin Brakeman, MD How to contact the [facility] Attending or Consulting provider [place] or covering provider during after hours [place], for this patient?  Check the care team in [facility] and look for a) attending/consulting [facility] provider listed and b) the [facility] team listed Log into [email or web address] and use [place] universal password to access. If you do not have the password, please contact the hospital operator. Locate the [facility] provider you are looking for under [facility] and page to a number that you can be directly reached. If you still have difficulty reaching the provider, please page the [facility] (Director on Call) for the Hospitalists listed on amion for assistance.  11/26/2021, 11:37 AM

## 2021-11-30 ENCOUNTER — Telehealth: Payer: Self-pay | Admitting: Nurse Practitioner

## 2021-11-30 NOTE — Telephone Encounter (Signed)
Left VM on wife's phone to call and schedule a hospital f/u with next week

## 2021-12-01 NOTE — Telephone Encounter (Signed)
Pt is scheduled for 10/3 at 3pm.  I called and lm that I was scheduling and if he would please call back to confirm appt.

## 2021-12-06 ENCOUNTER — Ambulatory Visit: Payer: BC Managed Care – PPO | Admitting: Nurse Practitioner

## 2021-12-06 ENCOUNTER — Ambulatory Visit: Payer: BC Managed Care – PPO | Admitting: Family Medicine

## 2021-12-06 ENCOUNTER — Encounter: Payer: Self-pay | Admitting: Family Medicine

## 2021-12-06 ENCOUNTER — Encounter: Payer: Self-pay | Admitting: Nurse Practitioner

## 2021-12-06 VITALS — BP 131/77 | HR 94 | Ht 70.0 in | Wt 188.4 lb

## 2021-12-06 VITALS — BP 132/70 | Ht 70.0 in | Wt 188.4 lb

## 2021-12-06 DIAGNOSIS — E1165 Type 2 diabetes mellitus with hyperglycemia: Secondary | ICD-10-CM

## 2021-12-06 DIAGNOSIS — Z794 Long term (current) use of insulin: Secondary | ICD-10-CM

## 2021-12-06 DIAGNOSIS — R7989 Other specified abnormal findings of blood chemistry: Secondary | ICD-10-CM

## 2021-12-06 DIAGNOSIS — E1122 Type 2 diabetes mellitus with diabetic chronic kidney disease: Secondary | ICD-10-CM

## 2021-12-06 DIAGNOSIS — D72829 Elevated white blood cell count, unspecified: Secondary | ICD-10-CM

## 2021-12-06 DIAGNOSIS — E782 Mixed hyperlipidemia: Secondary | ICD-10-CM

## 2021-12-06 DIAGNOSIS — I10 Essential (primary) hypertension: Secondary | ICD-10-CM | POA: Diagnosis not present

## 2021-12-06 DIAGNOSIS — N1831 Chronic kidney disease, stage 3a: Secondary | ICD-10-CM | POA: Diagnosis not present

## 2021-12-06 LAB — POCT GLUCOSE (DEVICE FOR HOME USE): POC Glucose: 277 mg/dl — AB (ref 70–99)

## 2021-12-06 MED ORDER — HUMULIN R U-500 KWIKPEN 500 UNIT/ML ~~LOC~~ SOPN: 65.0000 [IU] | PEN_INJECTOR | Freq: Three times a day (TID) | SUBCUTANEOUS | 3 refills | Status: DC

## 2021-12-06 MED ORDER — GLIPIZIDE ER 5 MG PO TB24: 5.0000 mg | ORAL_TABLET | Freq: Every day | ORAL | 3 refills | Status: DC

## 2021-12-06 NOTE — Progress Notes (Signed)
Endocrinology Follow Up Visit      12/06/2021, 12:39 PM   Subjective:    Patient ID: Terry [name], male    DOB: [birth year].  Terry [name] is being seen in follow up after being seen in consultation for management of currently uncontrolled symptomatic diabetes requested by  Kathyrn Drown, MD.   Past Medical History:  Diagnosis Date   Diabetes mellitus without complication ([place]) 4917   GERD (gastroesophageal reflux disease)    Hyperlipidemia    Hypertension    Osteoarthritis     Past Surgical History:  Procedure Laterality Date   ANKLE SURGERY Left 03/28/2005   2 plates/screws   COLONOSCOPY N/A 08/04/2017   Procedure: COLONOSCOPY;  Surgeon: Rogene Houston, MD;  [facility];  Service: Endoscopy;  Laterality: N/A;   ESOPHAGEAL DILATION N/A 08/04/2017   Procedure: ESOPHAGEAL DILATION;  Surgeon: Rogene Houston, MD;  [facility]: [facility];  Service: Endoscopy;  Laterality: N/A;   ESOPHAGOGASTRODUODENOSCOPY N/A 08/04/2017   Procedure: ESOPHAGOGASTRODUODENOSCOPY (EGD);  Surgeon: Rogene Houston, MD;  [facility]: [facility];  Service: Endoscopy;  Laterality: N/A;   POLYPECTOMY  08/04/2017   Procedure: POLYPECTOMY;  Surgeon: Rogene Houston, MD;  [facility]: [facility];  Service: Endoscopy;;   ROOT CANAL      Social History   Socioeconomic History   Marital status: Married    Spouse name: Not on file   Number of children: Not on file   Years of education: Not on file   Highest education level: Not on file  Occupational History   Not on file  Tobacco Use   Smoking status: Former    Types: Cigarettes    Quit date: 06/15/1992    Years since quitting: 29.4   Smokeless tobacco: Never  Vaping Use   Vaping Use: Never used  Substance and Sexual Activity   Alcohol use: Never   Drug use: Not Currently    Types: Marijuana    Comment: last used 1970   Sexual activity: Not on  file  Other Topics Concern   Not on file  Social History Narrative   Not on file   Social Determinants of Health   Financial Resource Strain: Not on file  Food Insecurity: Not on file  Transportation Needs: Not on file  Physical Activity: Not on file  Stress: Not on file  Social Connections: Not on file    Family History  Problem Relation Age of Onset   Hypertension Mother    Diabetes Mother    Heart disease Mother    Hyperlipidemia Mother    Cancer Father        lung    Outpatient Encounter Medications as of 12/06/2021  Medication Sig   amLODipine (NORVASC) 10 MG tablet Take 1 tablet (10 mg total) by mouth daily.   aspirin EC 81 MG tablet Take 1 tablet (81 mg total) by mouth daily with breakfast.   Continuous Blood Gluc Sensor (DEXCOM G6 SENSOR) MISC APPLY 1 SENSOR EVERY 10 DAYS FOR GLUCOSE MONITORING.   Continuous Blood Gluc Transmit (DEXCOM G6 TRANSMITTER) MISC USE AS DIRECTED.   fenofibrate 160 MG tablet TAKE 1 TABLET BY MOUTH ONCE  A DAY.   glipiZIDE (GLUCOTROL XL) 5 MG 24 hr tablet Take 1 tablet (5 mg total) by mouth daily with breakfast.   indapamide (LOZOL) 2.5 MG tablet TAKE (1) TABLET BY MOUTH ONCE DAILY.   metoprolol succinate (TOPROL-XL) 100 MG 24 hr tablet TAKE (1) TABLET BY MOUTH ONCE DAILY.   Multiple Vitamin (MULTIVITAMIN) tablet Take 1 tablet by mouth daily.   oxyCODONE-acetaminophen (PERCOCET) 10-325 MG tablet 1 taken 4 times daily as needed for pain   pantoprazole (PROTONIX) 40 MG tablet Take 1 tablet (40 mg total) by mouth 2 (two) times daily.   rosuvastatin (CRESTOR) 40 MG tablet Take 1 tablet (40 mg total) by mouth daily.   sildenafil (REVATIO) 20 MG tablet Take 1-5 tablets before sexual activity as directed. No greater that 5 tablets in a day.   sucralfate (CARAFATE) 1 g tablet Take 1 tablet (1 g total) by mouth 4 (four) times daily -  with meals and at bedtime for 28 days.   TECHLITE PEN NEEDLES 32G X 4 MM MISC INJECT 4 TIMES DAILY AS DIRECTED.    triamcinolone cream (KENALOG) 0.1 % Apply thin amount to top inner lip twice daily for 10 to 14 days till healed   valsartan (DIOVAN) 160 MG tablet Take 1 tablet (160 mg total) by mouth daily.   [DISCONTINUED] insulin regular human CONCENTRATED (HUMULIN R U-500 KWIKPEN) 500 UNIT/ML KwikPen Inject 20 Units into the skin 3 (three) times daily with meals. OR AS DIRECTED BY PHYSICIAN (Patient taking differently: Inject 20 Units into the skin 3 (three) times daily with meals. OR AS DIRECTED BY PHYSICIAN  Patient reports that he is currently taking 50 units)   ascorbic acid (VITAMIN C) 500 MG tablet Take 1 tablet (500 mg total) by mouth daily. (Patient not taking: Reported on 12/06/2021)   insulin regular human CONCENTRATED (HUMULIN R U-500 KWIKPEN) 500 UNIT/ML KwikPen Inject 65 Units into the skin 3 (three) times daily with meals. OR AS DIRECTED BY PHYSICIAN   zinc sulfate 220 (50 Zn) MG capsule Take 1 capsule (220 mg total) by mouth daily. (Patient not taking: Reported on 11/23/2021)   No facility-administered encounter medications on file as of 12/06/2021.    ALLERGIES: Allergies  Allergen Reactions   Invokana [Canagliflozin] Other (See Comments)    laryngitis   Altace [Ramipril] Cough   Metformin And Related Diarrhea   Trulicity [Dulaglutide] Swelling    VACCINATION STATUS: Immunization History  Administered Date(s) Administered   Influenza,inj,Quad PF,6+ Mos 01/30/2014, 01/29/2016, 01/30/2017, 01/26/2018, 01/23/2019, 01/17/2020, 01/01/2021   Influenza-Unspecified 01/26/2012, 02/21/2015   Pneumococcal Polysaccharide-23 01/30/2014   Td 12/19/2014    Diabetes He presents for his follow-up diabetic visit. He has type 2 diabetes mellitus. Onset time: Diagnosed at approximate age of 36. His disease course has been worsening. There are no hypoglycemic associated symptoms. Pertinent negatives for hypoglycemia include no nervousness/anxiousness. Associated symptoms include fatigue and weight loss.  Pertinent negatives for diabetes include no blurred vision, no polydipsia, no polyphagia and no polyuria. There are no hypoglycemic complications. Nocturnal hypoglycemia: mild. Symptoms are stable. Diabetic complications include nephropathy. Risk factors for coronary artery disease include diabetes mellitus, dyslipidemia, hypertension, male sex, obesity, stress and sedentary lifestyle. Current diabetic treatment includes intensive insulin program. He is compliant with treatment most of the time. His weight is decreasing steadily. He is following a generally healthy diet. When asked about meal planning, he reported none. He has not had a previous visit with a dietitian. He participates in exercise intermittently. His home  blood glucose trend is increasing steadily. His overall blood glucose range is >200 mg/dl. (He presents today, accompanied by his wife, with his CGM and logs showing gross hyperglycemia overall.  He was not due for another A1c today.  He did go to the hospital for allergic reaction to Trulicity causing GI ulcers, lip swelling, and gastroparesis.  Thus, it was discontinued.  Since its discontinuation, his glucose has continued to Terry and he has not been eating much due to decreased appetite.  Analysis of his CGM shows TIR 6%, TAR 92%, TBR < 2%.) An ACE inhibitor/angiotensin II receptor blocker is being taken. He does not see a podiatrist.Eye exam is not current.  Hypertension This is a chronic problem. The current episode started more than 1 year ago. The problem has been resolved since onset. The problem is controlled. Pertinent negatives include no blurred vision. There are no associated agents to hypertension. Risk factors for coronary artery disease include diabetes mellitus, dyslipidemia, male gender, obesity and stress. Past treatments include angiotensin blockers and calcium channel blockers. The current treatment provides mild improvement. Compliance problems include diet and exercise.   Hypertensive end-organ damage includes kidney disease. Identifiable causes of hypertension include chronic renal disease.  Hyperlipidemia This is a chronic problem. The current episode started more than 1 year ago. The problem is uncontrolled. Recent lipid tests were reviewed and are variable. Exacerbating diseases include chronic renal disease and diabetes. Factors aggravating his hyperlipidemia include fatty foods. Current antihyperlipidemic treatment includes statins and fibric acid derivatives. The current treatment provides moderate improvement of lipids. Compliance problems include adherence to diet, adherence to exercise and psychosocial issues.  Risk factors for coronary artery disease include diabetes mellitus, dyslipidemia, hypertension, male sex and obesity.    Review of systems  Constitutional: + steadily decreasing body weight,  current Body mass index is 27.03 kg/m. , no fatigue, no subjective hyperthermia, no subjective hypothermia Eyes: no blurry vision, no xerophthalmia ENT: no sore throat, no nodules palpated in throat, no dysphagia/odynophagia, no hoarseness Cardiovascular: no chest pain, no shortness of breath, no palpitations, no leg swelling Respiratory: no cough, no shortness of breath Gastrointestinal: no vomiting/diarrhea, + decreased appetite and intermittent nausea Musculoskeletal: no muscle/joint aches Skin: no rashes, no hyperemia Neurological: no tremors, no numbness, no tingling, no dizziness Psychiatric: no depression, no anxiety   Objective:     BP 131/77 (BP Location: Left Arm, Patient Position: Sitting, Cuff Size: Large)   Pulse 94   Ht '5\' 10"'$  (1.778 m)   Wt 188 lb 6.4 oz (85.5 kg)   BMI 27.03 kg/m   Wt Readings from Last 3 Encounters:  12/06/21 188 lb 6.4 oz (85.5 kg)  11/23/21 190 lb (86.2 kg)  11/23/21 198 lb (89.8 kg)    BP Readings from Last 3 Encounters:  12/06/21 131/77  11/26/21 (!) 150/85  11/23/21 118/64     Physical Exam-  Limited  Constitutional:  Body mass index is 27.03 kg/m. , not in acute distress, normal state of mind Eyes:  EOMI, no exophthalmos Neck: Supple Cardiovascular: RRR, no murmurs, rubs, or gallops, no edema Respiratory: Adequate breathing efforts, no crackles, rales, rhonchi, or wheezing Musculoskeletal: no gross deformities, strength intact in all four extremities, no gross restriction of joint movements Skin:  no rashes, no hyperemia Neurological: no tremor with outstretched hands    Diabetic Foot Exam - Simple   No data filed     CMP ( most recent) CMP     Component Value Date/Time   NA  138 11/25/2021 0422   NA 141 02/15/2021 0803   K 4.5 11/25/2021 0422   CL 100 11/25/2021 0422   CO2 30 11/25/2021 0422   GLUCOSE 264 (H) 11/25/2021 0422   BUN 28 (H) 11/25/2021 0422   BUN 30 (H) 02/15/2021 0803   CREATININE 1.52 (H) 11/25/2021 0422   CREATININE 0.91 02/25/2014 0727   CALCIUM 9.3 11/25/2021 0422   PROT 7.2 08/06/2021 0805   PROT 7.2 03/25/2021 0806   ALBUMIN 4.4 11/24/2021 0228   ALBUMIN 5.1 (H) 12/18/2020 0804   AST 26 08/06/2021 0805   ALT 30 08/06/2021 0805   ALKPHOS 33 (L) 08/06/2021 0805   BILITOT 0.6 08/06/2021 0805   BILITOT 0.3 12/18/2020 0804   GFRNONAA 52 (L) 11/25/2021 0422   GFRAA 66 01/17/2020 0928     Diabetic Labs (most recent): Lab Results  Component Value Date   HGBA1C 9.1 10/15/2021   HGBA1C 8.6 (A) 07/09/2021   HGBA1C 9.1 (H) 03/25/2021   MICROALBUR 150 07/09/2021   MICROALBUR 150 03/09/2020   MICROALBUR 3.36 (H) 05/11/2013     Lipid Panel ( most recent) Lipid Panel     Component Value Date/Time   CHOL 141 08/06/2021 0806   CHOL 216 (H) 03/25/2021 0806   TRIG 262 (H) 08/06/2021 0806   HDL 25 (L) 08/06/2021 0806   HDL 20 (L) 03/25/2021 0806   CHOLHDL 5.6 08/06/2021 0806   VLDL 52 (H) 08/06/2021 0806   LDLCALC 64 08/06/2021 0806   LDLCALC Comment (A) 03/25/2021 0806   LABVLDL Comment (A) 03/25/2021 0806      Lab Results   Component Value Date   TSH 0.713 04/30/2021   TSH 1.280 06/29/2020   TSH 1.470 11/11/2019   TSH 2.144 04/04/2019   TSH 0.986 07/01/2015   FREET4 1.39 06/29/2020   FREET4 1.44 11/11/2019           Assessment & Plan:   1) DM type 2 without retinopathy ([place])  - Terry [name] has currently uncontrolled symptomatic type 2 DM since  61 years of age.  He presents today, accompanied by his wife, with his CGM and logs showing gross hyperglycemia overall.  He was not due for another A1c today.  He did go to the hospital for allergic reaction to Trulicity causing GI ulcers, lip swelling, and gastroparesis.  Thus, it was discontinued.  Since its discontinuation, his glucose has continued to Terry and he has not been eating much due to decreased appetite.  Analysis of his CGM shows TIR 6%, TAR 92%, TBR < 2%.   Recent labs reviewed.   - I had a long discussion with him about the progressive nature of diabetes and the pathology behind its complications. -his diabetes is complicated by CKD stage 3a and he remains at a high risk for more acute and chronic complications which include CAD, CVA, CKD, retinopathy, and neuropathy. These are all discussed in detail with him.  - Nutritional counseling repeated at each appointment due to patients tendency to fall back in to old habits.  - The patient admits there is a room for improvement in their diet and drink choices. -  Suggestion is made for the patient to avoid simple carbohydrates from their diet including Cakes, Sweet Desserts / Pastries, Ice Cream, Soda (diet and regular), Sweet Tea, Candies, Chips, Cookies, Sweet Pastries, Store Bought Juices, Alcohol in Excess of 1-2 drinks a day, Artificial Sweeteners, Coffee Creamer, and "Sugar-free" Products. This will help patient to have stable blood glucose  profile and potentially avoid unintended weight gain.   - I encouraged the patient to switch to unprocessed or minimally processed complex starch  and increased protein intake (animal or plant source), fruits, and vegetables.   - Patient is advised to stick to a routine mealtimes to eat 3 meals a day and avoid unnecessary snacks (to snack only to correct hypoglycemia).  - I have approached him with the following individualized plan to manage  his diabetes and patient agrees:   -We discussed necessary changes in diet to prevent dramatic fluctuations in glucose and for safety purposes he needs to be eating to inject his insulin.  -He is advised to adjust his Humulin R U-500 to 65 units TID with meals if glucose is above 90 and he is eating.  Will also add back Glipizide 5 mg XL daily with breakfast.  I did discuss the potential of insulin pumps moving forward and encouraged him and his wife to do some research on Omnipod 5 and DASH to see if it would be an affordable option for them.  -He is encouraged to continue monitoring glucose 4 times daily (using his CGM), before meals and before bed, and to call the clinic if he has readings less than 70 or greater than 300 for 3 tests in a row.  - he is warned not to take insulin without proper monitoring per orders.  -He is NOT candidate for any GLP1 due to severe allergic reaction to Trulicity.  - Specific targets for  A1c;  LDL, HDL,  and Triglycerides were discussed with the patient.  2) Blood Pressure /Hypertension: His blood pressure is controlled to target.  He is advised to continue Diovan 160 mg, Amlodipine 10 mg po daily, Indapamide 2.5 mg po daily, and Metoprolol 100 mg po daily.      3) Lipids/Hyperlipidemia:    His most recent lipid panel from 08/06/21 shows controlled LDL of 64 and elevated but significantly improved triglycerides of 262.  He is advised to continue Crestor 40 mg po daily at bedtime and Fenofibrate 160 mg po daily.  Side effects and precautions discussed with him.  We discussed the need to avoid fried foods and butter to reduce his risk of cardiovascular events.  4)   Weight/Diet:  His Body mass index is 27.03 kg/m.  -   he is a candidate for weight loss. I discussed with him the fact that loss of 5 - 10% of his  current body weight will have the most impact on his diabetes management.  Exercise, and detailed carbohydrates information provided  -  detailed on discharge instructions.  5) Chronic Care/Health Maintenance: -he on ACEI/ARB and Statin medications and  is encouraged to initiate and continue to follow up with Ophthalmology, Dentist, Podiatrist at least yearly or according to recommendations, and advised to stay away from smoking. I have recommended yearly flu vaccine and pneumonia vaccine at least every 5 years; moderate intensity exercise for up to 150 minutes weekly; and  sleep for at least 7 hours a day.  - he is advised to maintain close follow up with Kathyrn Drown, MD for primary care needs, as well as his other providers for optimal and coordinated care.  6) Hypercalcemia:  Patient has had several instances of elevated calcium, with the highest level being at 10.9 mg/dL. A corresponding intact PTH level was low, at 11.   - Patient also has vitamin D deficiency- corrected with supplementation with the last level being 40.6.  -  No apparent complications from hypercalcemia/hyperparathyroidism: no history of  nephrolithiasis,  osteoporosis,fragility fractures. No abdominal pain, no major mood disorders, no bone pain.  - At this time the etiology of his hypercalcemia does not appear to be endocrine related.  His PCP had initiated consult to hematologist/oncologist to investigate for other potential causes.  He has been seen by hematologist for workup and was determined to be taking in too much calcium through supplements such as Tums.  His calcium levels have improved since stopping those supplements.      I spent 40 minutes in the care of the patient today including review of labs from [facility], Lipids, Thyroid Function, Hematology (current and  previous including abstractions from other facilities); face-to-face time discussing  his blood glucose readings/logs, discussing hypoglycemia and hyperglycemia episodes and symptoms, medications doses, his options of short and long term treatment based on the latest standards of care / guidelines;  discussion about incorporating lifestyle medicine;  and documenting the encounter. Risk reduction counseling performed per USPSTF guidelines to reduce obesity and cardiovascular risk factors.     Please refer to Patient Instructions for Blood Glucose Monitoring and Insulin/Medications Dosing Guide"  in media tab for additional information. Please  also refer to " Patient Self Inventory" in the Media  tab for reviewed elements of pertinent patient history.  Terry [name] participated in the discussions, expressed understanding, and voiced agreement with the above plans.  All questions were answered to his satisfaction. he is encouraged to contact clinic should he have any questions or concerns prior to his return visit.   Follow up plan: - Return for keep prev. scheduled appt in October.   Rayetta Pigg, [facility] [address] [place], [place] [address] Phone: [phone] Fax: [phone]  12/06/2021, 12:39 PM

## 2021-12-06 NOTE — Progress Notes (Signed)
   Subjective:    Patient ID: Terry [name], male    DOB: 09/27/1960, 61 y.o.   MRN: [id number]  HPI Patient arrives for a TOC for recent hospitalization for reaction to Trulicity. Patient states he is slowly doing better and saw endocrinologist this am. Hospital follow-up Blisters in his mouth are healing to some degree it is starting to get better but it still painful for him to swallow In addition to this his sugars have been running upper 200s to the 400 range his endocrinologist increased his insulin and have him considering an insulin pump They did stop GLP-1 medications I did discuss with the patient the importance of gradually increasing activity to get his strength back we also discussed healthy diet keeping his glucoses under good control doing follow-up labs in the near future plus also the importance of increasing physical activity to build up his stamina to go back to work  Review of Systems     Objective:   Physical Exam  General-in no acute distress Eyes-no discharge Lungs-respiratory rate normal, CTA CV-no murmurs,RRR Extremities skin warm dry no edema Neuro grossly normal Behavior normal, alert       Assessment & Plan:   1. Type 2 diabetes mellitus with hyperglycemia, with long-term current use of insulin (HCC) Very important to get this under good control his sugar today 277 he does have a Dexcom but sometimes it reads high high total when it reads high it is important for him to recheck the level to make sure its not extremely high we did discuss warning signs regarding diabetic ketoacidosis - POCT Glucose (Device for Home Use)  2. Leukocytosis, unspecified type Follow-up CBC recommended to make sure white blood count is coming back down it should be kidney function  3. Elevated serum creatinine Kidney function should be looking better but blood work  Keep all regular follow-up visits for pain visits follow-up sooner if any problems call us if any  issues

## 2021-12-07 NOTE — Progress Notes (Signed)
Lab orders placed and my chart message sent to patient.

## 2021-12-10 DIAGNOSIS — H524 Presbyopia: Secondary | ICD-10-CM | POA: Diagnosis not present

## 2021-12-10 DIAGNOSIS — H40023 Open angle with borderline findings, high risk, bilateral: Secondary | ICD-10-CM | POA: Diagnosis not present

## 2021-12-10 DIAGNOSIS — E119 Type 2 diabetes mellitus without complications: Secondary | ICD-10-CM | POA: Diagnosis not present

## 2021-12-10 LAB — HM DIABETES EYE EXAM

## 2021-12-17 ENCOUNTER — Encounter: Payer: Self-pay | Admitting: *Deleted

## 2021-12-24 NOTE — Addendum Note (Signed)
Addended by: Sallee Lange A on: 12/24/2021 12:41 PM   Modules accepted: Level of Service

## 2021-12-28 ENCOUNTER — Encounter: Payer: Self-pay | Admitting: Gastroenterology

## 2021-12-28 ENCOUNTER — Ambulatory Visit (INDEPENDENT_AMBULATORY_CARE_PROVIDER_SITE_OTHER): Payer: BC Managed Care – PPO | Admitting: Gastroenterology

## 2021-12-28 DIAGNOSIS — K2100 Gastro-esophageal reflux disease with esophagitis, without bleeding: Secondary | ICD-10-CM

## 2021-12-28 NOTE — Progress Notes (Signed)
GI Office Note    Referring Provider: Kathyrn Drown, MD Primary Care Physician:  Kathyrn Drown, MD  Primary Gastroenterologist: Garfield Cornea, MD   Chief Complaint   Chief Complaint  Patient presents with   hospital followup    History of Present Illness   Terry [name] is a 61 y.o. male presenting today for hospital follow up. Recent admission about 4 weeks ago due to adverse drug reaction (Trulicity), DKA, AKI. He developed intractable N/V and angioedema/mucositis. Developed  suspected reflux esophagitis with associated odynophagia. Symptoms occurred with fairly rapid dose escalation. Symptoms settled down with management of DKA, aggressive acid suppression therapy. He did not require endoscopic evaluation.    Today: doing well from GI standpoint. No heartburn, dysphagia, odynophagia. No abdominal pain or vomiting. BM regular. No melena, brbpr. He lost his job while in the hospital. He is trying to decide what to do because of loss of insurance. May file for disability.    EGD May 2019 with Dr. Laural Golden: - Normal proximal esophagus and mid esophagus. - LA Grade C reflux esophagitis. - Benign-appearing esophageal stenosis. Dilated. - 3 cm hiatal hernia. - Normal stomach. - Normal duodenal bulb and second portion of the duodenum. - No specimens collected.   Colonoscopy May 2019 with Dr.Rehman: -Hyperplastic polyp removed -Next colonoscopy in May 2029     Medications   Current Outpatient Medications  Medication Sig Dispense Refill   amLODipine (NORVASC) 10 MG tablet Take 1 tablet (10 mg total) by mouth daily. 90 tablet 3   aspirin EC 81 MG tablet Take 1 tablet (81 mg total) by mouth daily with breakfast. 120 tablet 3   Continuous Blood Gluc Sensor (DEXCOM G6 SENSOR) MISC APPLY 1 SENSOR EVERY 10 DAYS FOR GLUCOSE MONITORING. 3 each 2   Continuous Blood Gluc Transmit (DEXCOM G6 TRANSMITTER) MISC USE AS DIRECTED. 1 each 3   fenofibrate 160 MG tablet TAKE 1  TABLET BY MOUTH ONCE A DAY. 90 tablet 0   indapamide (LOZOL) 2.5 MG tablet TAKE (1) TABLET BY MOUTH ONCE DAILY. 90 tablet 3   insulin regular human CONCENTRATED (HUMULIN R U-500 KWIKPEN) 500 UNIT/ML KwikPen Inject 65 Units into the skin 3 (three) times daily with meals. OR AS DIRECTED BY PHYSICIAN (Patient taking differently: Inject 70 Units into the skin 3 (three) times daily with meals. OR AS DIRECTED BY PHYSICIAN) 45 mL 3   metoprolol succinate (TOPROL-XL) 100 MG 24 hr tablet TAKE (1) TABLET BY MOUTH ONCE DAILY. 90 tablet 3   Multiple Vitamin (MULTIVITAMIN) tablet Take 1 tablet by mouth daily.     oxyCODONE-acetaminophen (PERCOCET) 10-325 MG tablet 1 taken 4 times daily as needed for pain 120 tablet 0   pantoprazole (PROTONIX) 40 MG tablet Take 1 tablet (40 mg total) by mouth 2 (two) times daily. 60 tablet 2   rosuvastatin (CRESTOR) 40 MG tablet Take 1 tablet (40 mg total) by mouth daily. 90 tablet 3   sildenafil (REVATIO) 20 MG tablet Take 1-5 tablets before sexual activity as directed. No greater that 5 tablets in a day. 25 tablet 6   TECHLITE PEN NEEDLES 32G X 4 MM MISC INJECT 4 TIMES DAILY AS DIRECTED. 100 each PRN   triamcinolone cream (KENALOG) 0.1 % Apply thin amount to top inner lip twice daily for 10 to 14 days till healed 15 g 1   valsartan (DIOVAN) 160 MG tablet Take 1 tablet (160 mg total) by mouth daily. 90 tablet 3   No  current facility-administered medications for this visit.    Allergies   Allergies as of 12/28/2021 - Review Complete 12/28/2021  Allergen Reaction Noted   Invokana [canagliflozin] Other (See Comments) 50/27/7412   Trulicity [dulaglutide] Swelling 11/26/2021   Altace [ramipril] Cough 06/09/2012   Metformin and related Diarrhea 05/01/2017    Review of Systems   General: Negative for anorexia, weight loss, fever, chills, fatigue, weakness. ENT: Negative for hoarseness, difficulty swallowing , nasal congestion. CV: Negative for chest pain, angina,  palpitations, dyspnea on exertion, peripheral edema.  Respiratory: Negative for dyspnea at rest, dyspnea on exertion, cough, sputum, wheezing.  GI: See history of present illness. GU:  Negative for dysuria, hematuria, urinary incontinence, urinary frequency, nocturnal urination.  Endo: Negative for unusual weight change.     Physical Exam   BP (!) 174/76 (BP Location: Left Arm, Patient Position: Sitting, Cuff Size: Normal)   Pulse 90   Temp 97.9 F (36.6 C) (Oral)   Ht '5\' 10"'$  (1.778 m)   Wt 199 lb 9.6 oz (90.5 kg)   SpO2 96%   BMI 28.64 kg/m    General: Well-nourished, well-developed in no acute distress.  Eyes: No icterus. Mouth: Oropharyngeal mucosa moist and pink   Extremities: No lower extremity edema. No clubbing or deformities. Neuro: Alert and oriented x 4   Skin: Warm and dry, no jaundice.   Psych: Alert and cooperative, normal mood and affect.  Labs   Lab Results  Component Value Date   CREATININE 1.52 (H) 11/25/2021   BUN 28 (H) 11/25/2021   NA 138 11/25/2021   K 4.5 11/25/2021   CL 100 11/25/2021   CO2 30 11/25/2021   Lab Results  Component Value Date   ALT 30 08/06/2021   AST 26 08/06/2021   ALKPHOS 33 (L) 08/06/2021   BILITOT 0.6 08/06/2021   Lab Results  Component Value Date   WBC 21.2 (H) 11/23/2021   HGB 14.5 11/23/2021   HCT 42.3 11/23/2021   MCV 90.2 11/23/2021   PLT 224 11/23/2021   Lab Results  Component Value Date   LIPASE 22 11/23/2021    Imaging Studies   No results found.  Assessment   Reflux esophagitis: Patient developed intractable vomiting and lip swelling with escalation of Trulicity dose requiring hospitalization last month.  Likely developed angioedema of the lips from Trulicity and intractable vomiting due to drug-induced gastroparesis resulting in reflux esophagitis.  He was treated with aggressive acid suppression therapy while inpatient.  Went home on twice daily PPI.  His vomiting and odynophagia have  resolved.  Colon cancer screening: Due in 2029.  PLAN   Continue PPI for total of 12 weeks. He can reduce to once daily dosing at any time if tolerated.  After completing daily PPI, he can return to daily as needed as before.  Return ov as needed. Colonoscopy due in 2029.    Laureen Ochs. Bobby Rumpf, [place], [facility]

## 2021-12-28 NOTE — Patient Instructions (Signed)
I'm glad you are feeling better!  Continue pantoprazole at least once daily for two more months (total of 12 weeks). Then you can go back to once daily as needed for acid reflux.  Your next colonoscopy is due in 2029!  Return office visit as needed.

## 2021-12-31 ENCOUNTER — Other Ambulatory Visit: Payer: Self-pay

## 2021-12-31 ENCOUNTER — Other Ambulatory Visit: Payer: Self-pay | Admitting: Family Medicine

## 2021-12-31 MED ORDER — OXYCODONE-ACETAMINOPHEN 7.5-325 MG PO TABS: 1.0000 | ORAL_TABLET | Freq: Four times a day (QID) | ORAL | 0 refills | Status: DC | PRN

## 2021-12-31 NOTE — Telephone Encounter (Signed)
Terry [name] and they stated that due to unavailability of 10/325 Percocets , the patient received only 60 on 9/23 and they owe the patient 60 more tabs when available.

## 2021-12-31 NOTE — Progress Notes (Signed)
I sent a short supply. Dr. Wolfgang Phoenix can address additional medication/supply when he returns.

## 2022-01-02 ENCOUNTER — Other Ambulatory Visit: Payer: Self-pay | Admitting: Family Medicine

## 2022-01-17 ENCOUNTER — Telehealth: Payer: Self-pay | Admitting: Family Medicine

## 2022-01-17 ENCOUNTER — Other Ambulatory Visit: Payer: Self-pay | Admitting: Family Medicine

## 2022-01-17 ENCOUNTER — Encounter: Payer: Self-pay | Admitting: Family Medicine

## 2022-01-17 MED ORDER — OXYCODONE HCL 10 MG PO TABS: ORAL_TABLET | ORAL | 0 refills | Status: DC

## 2022-01-17 NOTE — Telephone Encounter (Signed)
Patient states [facility] is out of oxycodone 10/325 if you can write a different strength or different brand. Please advise

## 2022-01-17 NOTE — Telephone Encounter (Signed)
Prescription for plain oxycodone was sent, MyChart message sent to the patient

## 2022-01-17 NOTE — Progress Notes (Signed)
Switched over to plain oxycodone because Percocet was unavailable at the pharmacy

## 2022-01-21 ENCOUNTER — Encounter: Payer: Self-pay | Admitting: Nurse Practitioner

## 2022-01-21 ENCOUNTER — Ambulatory Visit (INDEPENDENT_AMBULATORY_CARE_PROVIDER_SITE_OTHER): Payer: BC Managed Care – PPO | Admitting: Nurse Practitioner

## 2022-01-21 VITALS — BP 123/71 | HR 72 | Ht 70.0 in | Wt 202.8 lb

## 2022-01-21 DIAGNOSIS — I10 Essential (primary) hypertension: Secondary | ICD-10-CM | POA: Diagnosis not present

## 2022-01-21 DIAGNOSIS — Z794 Long term (current) use of insulin: Secondary | ICD-10-CM | POA: Diagnosis not present

## 2022-01-21 DIAGNOSIS — E1122 Type 2 diabetes mellitus with diabetic chronic kidney disease: Secondary | ICD-10-CM

## 2022-01-21 DIAGNOSIS — E782 Mixed hyperlipidemia: Secondary | ICD-10-CM

## 2022-01-21 DIAGNOSIS — N1831 Chronic kidney disease, stage 3a: Secondary | ICD-10-CM | POA: Diagnosis not present

## 2022-01-21 MED ORDER — HUMULIN R U-500 KWIKPEN 500 UNIT/ML ~~LOC~~ SOPN: 70.0000 [IU] | PEN_INJECTOR | Freq: Three times a day (TID) | SUBCUTANEOUS | 3 refills | Status: DC

## 2022-01-21 NOTE — Progress Notes (Signed)
Endocrinology Follow Up Visit      01/21/2022, 8:36 AM   Subjective:    Patient ID: Terry [name], male    DOB: 1961/02/16.  Terry [name] is being seen in follow up after being seen in consultation for management of currently uncontrolled symptomatic diabetes requested by  Kathyrn Drown, MD.   Past Medical History:  Diagnosis Date   Diabetes mellitus without complication ([place]) 0539   GERD (gastroesophageal reflux disease)    Hyperlipidemia    Hypertension    Osteoarthritis     Past Surgical History:  Procedure Laterality Date   ANKLE SURGERY Left 03/28/2005   2 plates/screws   COLONOSCOPY N/A 08/04/2017   Procedure: COLONOSCOPY;  Surgeon: Rogene Houston, MD;  [facility];  Service: Endoscopy;  Laterality: N/A;   ESOPHAGEAL DILATION N/A 08/04/2017   Procedure: ESOPHAGEAL DILATION;  Surgeon: Rogene Houston, MD;  [facility];  Service: Endoscopy;  Laterality: N/A;   ESOPHAGOGASTRODUODENOSCOPY N/A 08/04/2017   Procedure: ESOPHAGOGASTRODUODENOSCOPY (EGD);  Surgeon: Rogene Houston, MD;  [facility];  Service: Endoscopy;  Laterality: N/A;   POLYPECTOMY  08/04/2017   Procedure: POLYPECTOMY;  Surgeon: Rogene Houston, MD;  [facility];  Service: Endoscopy;;   ROOT CANAL      Social History   Socioeconomic History   Marital status: Married    Spouse name: Not on file   Number of children: Not on file   Years of education: Not on file   Highest education level: Not on file  Occupational History   Not on file  Tobacco Use   Smoking status: Former    Types: Cigarettes    Quit date: 06/15/1992    Years since quitting: 29.6   Smokeless tobacco: Never  Vaping Use   Vaping Use: Never used  Substance and Sexual Activity   Alcohol use: Never   Drug use: Not Currently    Types: Marijuana    Comment: last used 1970   Sexual activity: Yes   Other Topics Concern   Not on file  Social History Narrative   Not on file   Social Determinants of Health   Financial Resource Strain: Not on file  Food Insecurity: Not on file  Transportation Needs: Not on file  Physical Activity: Not on file  Stress: Not on file  Social Connections: Not on file    Family History  Problem Relation Age of Onset   Hypertension Mother    Diabetes Mother    Heart disease Mother    Hyperlipidemia Mother    Cancer Father        lung    Outpatient Encounter Medications as of 01/21/2022  Medication Sig   amLODipine (NORVASC) 10 MG tablet Take 1 tablet (10 mg total) by mouth daily.   aspirin EC 81 MG tablet Take 1 tablet (81 mg total) by mouth daily with breakfast.   Continuous Blood Gluc Sensor (DEXCOM G6 SENSOR) MISC APPLY 1 SENSOR EVERY 10 DAYS FOR GLUCOSE MONITORING.   Continuous Blood Gluc Transmit (DEXCOM G6 TRANSMITTER) MISC USE AS DIRECTED.   fenofibrate 160 MG tablet TAKE 1 TABLET BY MOUTH ONCE A DAY.  indapamide (LOZOL) 2.5 MG tablet TAKE (1) TABLET BY MOUTH ONCE DAILY.   metoprolol succinate (TOPROL-XL) 100 MG 24 hr tablet TAKE (1) TABLET BY MOUTH ONCE DAILY.   Multiple Vitamin (MULTIVITAMIN) tablet Take 1 tablet by mouth daily.   Oxycodone HCl 10 MG TABS 1 take 4 times daily as needed pain   oxyCODONE-acetaminophen (PERCOCET) 10-325 MG tablet 1 taken 4 times daily as needed for pain   pantoprazole (PROTONIX) 40 MG tablet Take 1 tablet (40 mg total) by mouth 2 (two) times daily.   rosuvastatin (CRESTOR) 40 MG tablet Take 1 tablet (40 mg total) by mouth daily.   sildenafil (REVATIO) 20 MG tablet Take 1-5 tablets before sexual activity as directed. No greater that 5 tablets in a day.   TECHLITE PEN NEEDLES 32G X 4 MM MISC INJECT 4 TIMES DAILY AS DIRECTED.   triamcinolone cream (KENALOG) 0.1 % Apply thin amount to top inner lip twice daily for 10 to 14 days till healed   valsartan (DIOVAN) 160 MG tablet Take 1 tablet (160 mg total) by  mouth daily.   [DISCONTINUED] insulin regular human CONCENTRATED (HUMULIN R U-500 KWIKPEN) 500 UNIT/ML KwikPen Inject 65 Units into the skin 3 (three) times daily with meals. OR AS DIRECTED BY PHYSICIAN (Patient taking differently: Inject 70 Units into the skin 3 (three) times daily with meals. OR AS DIRECTED BY PHYSICIAN)   insulin regular human CONCENTRATED (HUMULIN R U-500 KWIKPEN) 500 UNIT/ML KwikPen Inject 70-80 Units into the skin 3 (three) times daily with meals. OR AS DIRECTED BY PHYSICIAN   No facility-administered encounter medications on file as of 01/21/2022.    ALLERGIES: Allergies  Allergen Reactions   Invokana [Canagliflozin] Other (See Comments)    laryngitis   Trulicity [Dulaglutide] Swelling    Patient also did not tolerate because of blistering in the mouth would avoid all GLP-1's patient was hospitalized with diabetic ketoacidosis August 2023   Altace [Ramipril] Cough   Metformin And Related Diarrhea    VACCINATION STATUS: Immunization History  Administered Date(s) Administered   Influenza,inj,Quad PF,6+ Mos 01/30/2014, 01/29/2016, 01/30/2017, 01/26/2018, 01/23/2019, 01/17/2020, 01/01/2021   Influenza-Unspecified 01/26/2012, 02/21/2015   Pneumococcal Polysaccharide-23 01/30/2014   Td 12/19/2014    Diabetes He presents for his follow-up diabetic visit. He has type 2 diabetes mellitus. Onset time: Diagnosed at approximate age of 46. His disease course has been improving. There are no hypoglycemic associated symptoms. Pertinent negatives for hypoglycemia include no nervousness/anxiousness. Associated symptoms include fatigue. Pertinent negatives for diabetes include no blurred vision, no polydipsia, no polyphagia, no polyuria and no weight loss. There are no hypoglycemic complications. Nocturnal hypoglycemia: mild. Symptoms are stable. Diabetic complications include nephropathy. Risk factors for coronary artery disease include diabetes mellitus, dyslipidemia,  hypertension, male sex, obesity, stress and sedentary lifestyle. Current diabetic treatment includes intensive insulin program. He is compliant with treatment most of the time. His weight is increasing steadily. He is following a generally healthy diet. When asked about meal planning, he reported none. He has not had a previous visit with a dietitian. He participates in exercise intermittently. His home blood glucose trend is decreasing steadily. His overall blood glucose range is >200 mg/dl. (He presents today with his logs and CGM showing improving glycemic profile.  His POCT A1c today is 8.9%, improving from last visit of 9.1%.  He feels much better now that the Trulicity is out of his system.  Analysis of his CGM shows TIR 23%, TAR 77%, TBR <1% with GMI of 9.3%.  He  has unpredictable insulin sensitivity.  He denies any significant hypoglycemia.) An ACE inhibitor/angiotensin II receptor blocker is being taken. He does not see a podiatrist.Eye exam is not current.  Hypertension This is a chronic problem. The current episode started more than 1 year ago. The problem has been resolved since onset. The problem is controlled. Pertinent negatives include no blurred vision. There are no associated agents to hypertension. Risk factors for coronary artery disease include diabetes mellitus, dyslipidemia, male gender, obesity and stress. Past treatments include angiotensin blockers and calcium channel blockers. The current treatment provides mild improvement. Compliance problems include diet and exercise.  Hypertensive end-organ damage includes kidney disease. Identifiable causes of hypertension include chronic renal disease.  Hyperlipidemia This is a chronic problem. The current episode started more than 1 year ago. The problem is uncontrolled. Recent lipid tests were reviewed and are variable. Exacerbating diseases include chronic renal disease and diabetes. Factors aggravating his hyperlipidemia include fatty foods.  Current antihyperlipidemic treatment includes statins and fibric acid derivatives. The current treatment provides moderate improvement of lipids. Compliance problems include adherence to diet, adherence to exercise and psychosocial issues.  Risk factors for coronary artery disease include diabetes mellitus, dyslipidemia, hypertension, male sex and obesity.    Review of systems  Constitutional: + steadily increasing body weight (regaining some back that he previously lost unintentionally),  current Body mass index is 29.1 kg/m. , no fatigue, no subjective hyperthermia, no subjective hypothermia Eyes: no blurry vision, no xerophthalmia ENT: no sore throat, no nodules palpated in throat, no dysphagia/odynophagia, no hoarseness Cardiovascular: no chest pain, no shortness of breath, no palpitations, no leg swelling Respiratory: no cough, no shortness of breath Gastrointestinal: no nausea/vomiting/diarrhea Musculoskeletal: no muscle/joint aches Skin: no rashes, no hyperemia Neurological: no tremors, no numbness, no tingling, no dizziness Psychiatric: no depression, no anxiety   Objective:     BP 123/71 (BP Location: Left Arm, Patient Position: Sitting, Cuff Size: Large)   Pulse 72   Ht '5\' 10"'$  (1.778 m)   Wt 202 lb 12.8 oz (92 kg)   BMI 29.10 kg/m   Wt Readings from Last 3 Encounters:  01/21/22 202 lb 12.8 oz (92 kg)  12/28/21 199 lb 9.6 oz (90.5 kg)  12/06/21 188 lb 6.4 oz (85.5 kg)    BP Readings from Last 3 Encounters:  01/21/22 123/71  12/28/21 (!) 169/82  12/06/21 132/70     Physical Exam- Limited  Constitutional:  Body mass index is 29.1 kg/m. , not in acute distress, normal state of mind Eyes:  EOMI, no exophthalmos Neck: Supple Cardiovascular: RRR, no murmurs, rubs, or gallops, no edema Respiratory: Adequate breathing efforts, no crackles, rales, rhonchi, or wheezing Musculoskeletal: no gross deformities, strength intact in all four extremities, no gross restriction of  joint movements Skin:  no rashes, no hyperemia Neurological: no tremor with outstretched hands    Diabetic Foot Exam - Simple   No data filed     CMP ( most recent) CMP     Component Value Date/Time   NA 138 11/25/2021 0422   NA 141 02/15/2021 0803   K 4.5 11/25/2021 0422   CL 100 11/25/2021 0422   CO2 30 11/25/2021 0422   GLUCOSE 264 (H) 11/25/2021 0422   BUN 28 (H) 11/25/2021 0422   BUN 30 (H) 02/15/2021 0803   CREATININE 1.52 (H) 11/25/2021 0422   CREATININE 0.91 02/25/2014 0727   CALCIUM 9.3 11/25/2021 0422   PROT 7.2 08/06/2021 0805   PROT 7.2 03/25/2021 0806  ALBUMIN 4.4 11/24/2021 0228   ALBUMIN 5.1 (H) 12/18/2020 0804   AST 26 08/06/2021 0805   ALT 30 08/06/2021 0805   ALKPHOS 33 (L) 08/06/2021 0805   BILITOT 0.6 08/06/2021 0805   BILITOT 0.3 12/18/2020 0804   GFRNONAA 52 (L) 11/25/2021 0422   GFRAA 66 01/17/2020 0928     Diabetic Labs (most recent): Lab Results  Component Value Date   HGBA1C 9.1 10/15/2021   HGBA1C 8.6 (A) 07/09/2021   HGBA1C 9.1 (H) 03/25/2021   MICROALBUR 150 07/09/2021   MICROALBUR 150 03/09/2020   MICROALBUR 3.36 (H) 05/11/2013     Lipid Panel ( most recent) Lipid Panel     Component Value Date/Time   CHOL 141 08/06/2021 0806   CHOL 216 (H) 03/25/2021 0806   TRIG 262 (H) 08/06/2021 0806   HDL 25 (L) 08/06/2021 0806   HDL 20 (L) 03/25/2021 0806   CHOLHDL 5.6 08/06/2021 0806   VLDL 52 (H) 08/06/2021 0806   LDLCALC 64 08/06/2021 0806   LDLCALC Comment (A) 03/25/2021 0806   LABVLDL Comment (A) 03/25/2021 0806      Lab Results  Component Value Date   TSH 0.713 04/30/2021   TSH 1.280 06/29/2020   TSH 1.470 11/11/2019   TSH 2.144 04/04/2019   TSH 0.986 07/01/2015   FREET4 1.39 06/29/2020   FREET4 1.44 11/11/2019           Assessment & Plan:   1) DM type 2 without retinopathy ([place])  - Terry [name] has currently uncontrolled symptomatic type 2 DM since  61 years of age.  He presents today with his  logs and CGM showing improving glycemic profile.  His POCT A1c today is 8.9%, improving from last visit of 9.1%.  He feels much better now that the Trulicity is out of his system.  Analysis of his CGM shows TIR 23%, TAR 77%, TBR <1% with GMI of 9.3%.  He has unpredictable insulin sensitivity.  He denies any significant hypoglycemia.   Recent labs reviewed.   - I had a long discussion with him about the progressive nature of diabetes and the pathology behind its complications. -his diabetes is complicated by CKD stage 3a and he remains at a high risk for more acute and chronic complications which include CAD, CVA, CKD, retinopathy, and neuropathy. These are all discussed in detail with him.  - Nutritional counseling repeated at each appointment due to patients tendency to fall back in to old habits.  - The patient admits there is a room for improvement in their diet and drink choices. -  Suggestion is made for the patient to avoid simple carbohydrates from their diet including Cakes, Sweet Desserts / Pastries, Ice Cream, Soda (diet and regular), Sweet Tea, Candies, Chips, Cookies, Sweet Pastries, Store Bought Juices, Alcohol in Excess of 1-2 drinks a day, Artificial Sweeteners, Coffee Creamer, and "Sugar-free" Products. This will help patient to have stable blood glucose profile and potentially avoid unintended weight gain.   - I encouraged the patient to switch to unprocessed or minimally processed complex starch and increased protein intake (animal or plant source), fruits, and vegetables.   - Patient is advised to stick to a routine mealtimes to eat 3 meals a day and avoid unnecessary snacks (to snack only to correct hypoglycemia).  - I have approached him with the following individualized plan to manage  his diabetes and patient agrees:   -We discussed necessary changes in diet to prevent dramatic fluctuations in glucose and for  safety purposes he needs to be eating to inject his insulin.  -He  is advised to adjust his Humulin R U-500 to 80 units with breakfast and lunch and continue 70 units with supper if glucose is above 90 and he is eating. He can continue Glipizide 5 mg XL daily with breakfast.    -He is encouraged to continue monitoring glucose 4 times daily (using his CGM), before meals and before bed, and to call the clinic if he has readings less than 70 or greater than 300 for 3 tests in a row.  - he is warned not to take insulin without proper monitoring per orders.  -He is NOT candidate for any GLP1 due to severe allergic reaction to Trulicity.  - Specific targets for  A1c;  LDL, HDL,  and Triglycerides were discussed with the patient.  2) Blood Pressure /Hypertension: His blood pressure is controlled to target.  He is advised to continue Diovan 160 mg, Amlodipine 10 mg po daily, Indapamide 2.5 mg po daily, and Metoprolol 100 mg po daily.      3) Lipids/Hyperlipidemia:    His most recent lipid panel from 08/06/21 shows controlled LDL of 64 and elevated but significantly improved triglycerides of 262.  He is advised to continue Crestor 40 mg po daily at bedtime and Fenofibrate 160 mg po daily.  Side effects and precautions discussed with him.  We discussed the need to avoid fried foods and butter to reduce his risk of cardiovascular events.  4)  Weight/Diet:  His Body mass index is 29.1 kg/m.  -   he is a candidate for weight loss. I discussed with him the fact that loss of 5 - 10% of his  current body weight will have the most impact on his diabetes management.  Exercise, and detailed carbohydrates information provided  -  detailed on discharge instructions.  5) Chronic Care/Health Maintenance: -he on ACEI/ARB and Statin medications and  is encouraged to initiate and continue to follow up with Ophthalmology, Dentist, Podiatrist at least yearly or according to recommendations, and advised to stay away from smoking. I have recommended yearly flu vaccine and pneumonia vaccine  at least every 5 years; moderate intensity exercise for up to 150 minutes weekly; and  sleep for at least 7 hours a day.  - he is advised to maintain close follow up with Kathyrn Drown, MD for primary care needs, as well as his other providers for optimal and coordinated care.  6) Hypercalcemia:  Patient has had several instances of elevated calcium, with the highest level being at 10.9 mg/dL. A corresponding intact PTH level was low, at 11.   - Patient also has vitamin D deficiency- corrected with supplementation with the last level being 40.6.  - No apparent complications from hypercalcemia/hyperparathyroidism: no history of  nephrolithiasis,  osteoporosis,fragility fractures. No abdominal pain, no major mood disorders, no bone pain.  - At this time the etiology of his hypercalcemia does not appear to be endocrine related.  His PCP had initiated consult to hematologist/oncologist to investigate for other potential causes.  He has been seen by hematologist for workup and was determined to be taking in too much calcium through supplements such as Tums.  His calcium levels have improved since stopping those supplements.     I spent 32 minutes in the care of the patient today including review of labs from [facility], Lipids, Thyroid Function, Hematology (current and previous including abstractions from other facilities); face-to-face time discussing  his blood  glucose readings/logs, discussing hypoglycemia and hyperglycemia episodes and symptoms, medications doses, his options of short and long term treatment based on the latest standards of care / guidelines;  discussion about incorporating lifestyle medicine;  and documenting the encounter. Risk reduction counseling performed per USPSTF guidelines to reduce obesity and cardiovascular risk factors.     Please refer to Patient Instructions for Blood Glucose Monitoring and Insulin/Medications Dosing Guide"  in media tab for additional information. Please   also refer to " Patient Self Inventory" in the Media  tab for reviewed elements of pertinent patient history.  Terry [name] participated in the discussions, expressed understanding, and voiced agreement with the above plans.  All questions were answered to his satisfaction. he is encouraged to contact clinic should he have any questions or concerns prior to his return visit.   Follow up plan: - Return in about 3 months (around 04/23/2022) for Diabetes F/U with A1c in office, No previsit labs, Bring meter and logs.   Rayetta Pigg, [facility] [address] [place], [place] [address] Phone: [phone] Fax: [phone]  01/21/2022, 8:36 AM

## 2022-01-26 ENCOUNTER — Other Ambulatory Visit: Payer: Self-pay | Admitting: Nurse Practitioner

## 2022-02-11 ENCOUNTER — Ambulatory Visit: Payer: BC Managed Care – PPO | Admitting: Family Medicine

## 2022-02-16 ENCOUNTER — Ambulatory Visit: Payer: BC Managed Care – PPO | Admitting: Family Medicine

## 2022-02-16 ENCOUNTER — Telehealth: Payer: Self-pay | Admitting: Family Medicine

## 2022-02-16 ENCOUNTER — Encounter: Payer: Self-pay | Admitting: Family Medicine

## 2022-02-16 VITALS — BP 138/78 | Wt 199.8 lb

## 2022-02-16 DIAGNOSIS — E785 Hyperlipidemia, unspecified: Secondary | ICD-10-CM

## 2022-02-16 DIAGNOSIS — R7989 Other specified abnormal findings of blood chemistry: Secondary | ICD-10-CM | POA: Diagnosis not present

## 2022-02-16 DIAGNOSIS — E1165 Type 2 diabetes mellitus with hyperglycemia: Secondary | ICD-10-CM

## 2022-02-16 DIAGNOSIS — E1169 Type 2 diabetes mellitus with other specified complication: Secondary | ICD-10-CM | POA: Diagnosis not present

## 2022-02-16 DIAGNOSIS — Z79891 Long term (current) use of opiate analgesic: Secondary | ICD-10-CM

## 2022-02-16 DIAGNOSIS — K130 Diseases of lips: Secondary | ICD-10-CM

## 2022-02-16 DIAGNOSIS — Z794 Long term (current) use of insulin: Secondary | ICD-10-CM

## 2022-02-16 DIAGNOSIS — J189 Pneumonia, unspecified organism: Secondary | ICD-10-CM

## 2022-02-16 MED ORDER — OXYCODONE-ACETAMINOPHEN 10-325 MG PO TABS: ORAL_TABLET | ORAL | 0 refills | Status: DC

## 2022-02-16 MED ORDER — AMOXICILLIN-POT CLAVULANATE 875-125 MG PO TABS: 1.0000 | ORAL_TABLET | Freq: Two times a day (BID) | ORAL | 0 refills | Status: DC

## 2022-02-16 NOTE — Telephone Encounter (Signed)
Please call the patient let him know that I spoke with the pharmacist they state the pain medicine is not in stock the prescription was sent there during the lunch hour he should be able to pick it up this afternoon along with the antibiotic

## 2022-02-16 NOTE — Progress Notes (Signed)
Subjective:    Patient ID: Terry [name], male    DOB: [birth year], 61 y.o.   MRN: [id number]  HPI This patient was seen today for chronic pain  The medication list was reviewed and updated.  Location of Pain for which the patient has been treated with regarding narcotics: Pain is primarily in the hands as well as left knee as well as left ankle  Onset of this pain: Several years   -Compliance with medication: Oxycodone 3-25/Oxycodone 10 mg (pt prefers Oxydone 10)  - Number patient states they take daily: 4-sometimes more depending on activity/day  -when was the last dose patient took? 3 days ago  The patient was advised the importance of maintaining medication and not using illegal substances with these.  Here for refills and follow up  The patient was educated that we can provide 3 monthly scripts for their medication, it is their responsibility to follow the instructions.  Side effects or complications from medications: none  Patient is aware that pain medications are meant to minimize the severity of the pain to allow their pain levels to improve to allow for better function. They are aware of that pain medications cannot totally remove their pain.  Due for UDT ( at least once per year) : completed 10/22/21  Scale of 1 to 10 ( 1 is least 10 is most) Your pain level without the medicine: 8 Your pain level with medication: 4-5  Scale 1 to 10 ( 1-helps very little, 10 helps very well) How well does your pain medication reduce your pain so you can function better through out the day? 8  Quality of the pain: Burning aching  Persistence of the pain: Prior and all the time  Modifying factors: Worse with activity  Pt reports the raw spot on top of lip has not improved. Has been using the med prescribed but if he does not use the med, the area comes back.     Patient is sick with respiratory illness over the past few weeks worse over the past few days Denies shortness  of breath Review of Systems     Objective:   Physical Exam  General-in no acute distress Eyes-no discharge Lungs-respiratory rate normal, crackles noted in the left base along with coughing CV-no murmurs,RRR Extremities skin warm dry no edema Neuro grossly normal Behavior normal, alert       Assessment & Plan:   1. Type 2 diabetes mellitus with hyperglycemia, with long-term current use of insulin (HCC) Follows with endocrinology here do urine micro later in December - Microalbumin / creatinine urine ratio  2. Elevated serum creatinine Concerned about developing CKD very important to keep blood pressure and diabetes under good control - Basic metabolic panel  3. Encounter for long-term opiate analgesic use The patient was seen in followup for chronic pain. A review over at their current pain status was discussed. Drug registry was checked. Prescriptions were given.  Regular follow-up recommended. Discussion was held regarding the importance of compliance with medication as well as pain medication contract.  Patient was informed that medication may cause drowsiness and should not be combined  with other medications/alcohol or street drugs. If the patient feels medication is causing altered alertness then do not drive or operate dangerous equipment.  Should be noted that the patient appears to be meeting appropriate use of opioids and response.  Evidenced by improved function and decent pain control without significant side effects and no evidence of overt aberrancy issues.  Upon discussion with the patient today they understand that opioid therapy is optional and they feel that the pain has been refractory to reasonable conservative measures and is significant and affecting quality of life enough to warrant ongoing therapy and wishes to continue opioids.  Refills were provided. I did speak with pharmacist they have the medicine in stock the medication was sent in  - Hepatic  function panel  4. Hyperlipidemia associated with type 2 diabetes mellitus (HCC) Check cholesterol continue medication.  Do lab work in the month of December - Lipid panel  5. Pneumonia of left lower lobe due to infectious organism Antibiotics prescribed warning signs discussed follow-up if ongoing troubles  6. Lesion of lip Has an ongoing area on his upper lip that is concerning - Ambulatory referral to Dermatology If worse follow-up regarding the pneumonia

## 2022-02-16 NOTE — Patient Instructions (Signed)
Please read carefully-this is the current policy.  All patients who are on chronic pain management will need to follow this policy.  As part of your visit today we have covered your chronic pain. You have been given prescription(s) for pain medicines.The DEA and [place] require that any patient on pain medications must be seen every 3 months. You will need to schedule an office visit today before further pain medications are issued. If you fail to come for your office visit we will not be sending in another month on the prescription pain meds.  You will always need to bring your pain medicine bottle to your office visit. We will be doing a random pill count.  Since we are managing your pain, do not get pain scripts from other doctors. We check the electronic database prescription registry regularly. If you are receiving pain medicines from another source we will STOP prescribing pain medicines.   We will not refill medications or early nor will we give an extended month supply at the end of these prescriptions.It is your responsibility to keep up with medications and attend your office visit. Lost or stolen pain medications will not be replaced.  It is your responsibility to schedule an office visit in 3 months to be seen before you are out of your medication. Do not call our office to request early refills or additional refills.  Office appointments fill up quickly so therefore we will schedule this office visit now for 3 months.  We believe that most patients take their meds as prescribed but drug misuse and diversion is a serious problem in the [place]. Our office does standard measures to insure proper care to all. All patients are subject to random urine drug screens/ saliva tests and random pill counts. Also all patients drug prescription records are reviewed on a regular basis in accordance with [facility] medical board policies.  Remember, do not use alcohol or illegal drugs with your pain  medications. If you are feeling drowsy or affected by your medicine you are not to operate any machinery , do any dangerous activities or drive while this is occurring.   We are required by law to adhere to strict regulations. Failure on our part to follow these regulations could jeopardize our prescription license which in turn would cause [place] not to be able to care for you.Thank you for your understanding and following these policies.  [place]

## 2022-02-19 ENCOUNTER — Other Ambulatory Visit: Payer: Self-pay | Admitting: Nurse Practitioner

## 2022-02-19 ENCOUNTER — Other Ambulatory Visit: Payer: Self-pay | Admitting: Family Medicine

## 2022-02-21 ENCOUNTER — Other Ambulatory Visit: Payer: Self-pay | Admitting: "Endocrinology

## 2022-02-21 DIAGNOSIS — E782 Mixed hyperlipidemia: Secondary | ICD-10-CM

## 2022-02-25 ENCOUNTER — Other Ambulatory Visit: Payer: Self-pay | Admitting: Nurse Practitioner

## 2022-03-17 DIAGNOSIS — Z794 Long term (current) use of insulin: Secondary | ICD-10-CM | POA: Diagnosis not present

## 2022-03-17 DIAGNOSIS — E1169 Type 2 diabetes mellitus with other specified complication: Secondary | ICD-10-CM | POA: Diagnosis not present

## 2022-03-17 DIAGNOSIS — R7989 Other specified abnormal findings of blood chemistry: Secondary | ICD-10-CM | POA: Diagnosis not present

## 2022-03-17 DIAGNOSIS — E785 Hyperlipidemia, unspecified: Secondary | ICD-10-CM | POA: Diagnosis not present

## 2022-03-17 DIAGNOSIS — E1165 Type 2 diabetes mellitus with hyperglycemia: Secondary | ICD-10-CM | POA: Diagnosis not present

## 2022-03-18 LAB — HEPATIC FUNCTION PANEL
ALT: 24 IU/L (ref 0–44)
AST: 20 IU/L (ref 0–40)
Albumin: 4.6 g/dL (ref 3.8–4.9)
Alkaline Phosphatase: 56 IU/L (ref 44–121)
Bilirubin Total: 0.3 mg/dL (ref 0.0–1.2)
Bilirubin, Direct: 0.11 mg/dL (ref 0.00–0.40)
Total Protein: 7.3 g/dL (ref 6.0–8.5)

## 2022-03-18 LAB — LIPID PANEL
Chol/HDL Ratio: 6.7 ratio — ABNORMAL HIGH (ref 0.0–5.0)
Cholesterol, Total: 187 mg/dL (ref 100–199)
HDL: 28 mg/dL — ABNORMAL LOW (ref >39)
LDL Chol Calc (NIH): 89 mg/dL (ref 0–99)
Triglycerides: 426 mg/dL — ABNORMAL HIGH (ref 0–149)
VLDL Cholesterol Cal: 70 mg/dL — ABNORMAL HIGH (ref 5–40)

## 2022-03-18 LAB — BASIC METABOLIC PANEL
BUN/Creatinine Ratio: 15 (ref 10–24)
BUN: 21 mg/dL (ref 8–27)
CO2: 22 mmol/L (ref 20–29)
Calcium: 10 mg/dL (ref 8.6–10.2)
Chloride: 98 mmol/L (ref 96–106)
Creatinine, Ser: 1.43 mg/dL — ABNORMAL HIGH (ref 0.76–1.27)
Glucose: 391 mg/dL — ABNORMAL HIGH (ref 70–99)
Potassium: 4.6 mmol/L (ref 3.5–5.2)
Sodium: 137 mmol/L (ref 134–144)
eGFR: 56 mL/min/{1.73_m2} — ABNORMAL LOW (ref >59)

## 2022-03-18 LAB — MICROALBUMIN / CREATININE URINE RATIO
Creatinine, Urine: 56.6 mg/dL
Microalb/Creat Ratio: 177 mg/g creat — ABNORMAL HIGH (ref 0–29)
Microalbumin, Urine: 100.1 ug/mL

## 2022-03-25 ENCOUNTER — Other Ambulatory Visit: Payer: Self-pay | Admitting: Family Medicine

## 2022-03-29 MED ORDER — EZETIMIBE 10 MG PO TABS: 10.0000 mg | ORAL_TABLET | Freq: Every day | ORAL | 1 refills | Status: DC

## 2022-03-29 NOTE — Addendum Note (Signed)
Addended by: Dairl Ponder on: 03/29/2022 01:27 PM   Modules accepted: Orders

## 2022-04-29 ENCOUNTER — Encounter: Payer: Self-pay | Admitting: Nurse Practitioner

## 2022-04-29 ENCOUNTER — Ambulatory Visit: Payer: BC Managed Care – PPO | Admitting: Nurse Practitioner

## 2022-04-29 VITALS — BP 140/70 | HR 67 | Ht 70.0 in | Wt 207.6 lb

## 2022-04-29 DIAGNOSIS — I10 Essential (primary) hypertension: Secondary | ICD-10-CM | POA: Diagnosis not present

## 2022-04-29 DIAGNOSIS — N1831 Chronic kidney disease, stage 3a: Secondary | ICD-10-CM

## 2022-04-29 DIAGNOSIS — E782 Mixed hyperlipidemia: Secondary | ICD-10-CM | POA: Diagnosis not present

## 2022-04-29 DIAGNOSIS — E1122 Type 2 diabetes mellitus with diabetic chronic kidney disease: Secondary | ICD-10-CM

## 2022-04-29 DIAGNOSIS — Z794 Long term (current) use of insulin: Secondary | ICD-10-CM | POA: Diagnosis not present

## 2022-04-29 LAB — POCT GLYCOSYLATED HEMOGLOBIN (HGB A1C): Hemoglobin A1C: 9.8 % — AB (ref 4.0–5.6)

## 2022-04-29 MED ORDER — INSULIN LISPRO 100 UNIT/ML IJ SOLN: 15.0000 [IU] | Freq: Three times a day (TID) | INTRAMUSCULAR | 3 refills | Status: DC

## 2022-04-29 MED ORDER — TOUJEO MAX SOLOSTAR 300 UNIT/ML ~~LOC~~ SOPN: 50.0000 [IU] | PEN_INJECTOR | Freq: Every evening | SUBCUTANEOUS | 3 refills | Status: DC

## 2022-04-29 MED ORDER — PEN NEEDLES 31G X 6 MM MISC: 6 refills | Status: DC

## 2022-04-29 MED ORDER — GLIPIZIDE ER 5 MG PO TB24
5.0000 mg | ORAL_TABLET | Freq: Every day | ORAL | 1 refills | Status: DC
Start: 2022-04-29 — End: 2023-03-01

## 2022-04-29 NOTE — Progress Notes (Signed)
Endocrinology Follow Up Visit      04/29/2022, 9:14 AM   Subjective:    Patient ID: Terry [name], male    DOB: 01/13/1961.  Terry [name] is being seen in follow up after being seen in consultation for management of currently uncontrolled symptomatic diabetes requested by  Kathyrn Drown, MD.   Past Medical History:  Diagnosis Date   Diabetes mellitus without complication ([place]) 9417   GERD (gastroesophageal reflux disease)    Hyperlipidemia    Hypertension    Osteoarthritis     Past Surgical History:  Procedure Laterality Date   ANKLE SURGERY Left 03/28/2005   2 plates/screws   COLONOSCOPY N/A 08/04/2017   Procedure: COLONOSCOPY;  Surgeon: Rogene Houston, MD;  [facility];  Service: Endoscopy;  Laterality: N/A;   ESOPHAGEAL DILATION N/A 08/04/2017   Procedure: ESOPHAGEAL DILATION;  Surgeon: Rogene Houston, MD;  Location: [facility];  Service: Endoscopy;  Laterality: N/A;   ESOPHAGOGASTRODUODENOSCOPY N/A 08/04/2017   Procedure: ESOPHAGOGASTRODUODENOSCOPY (EGD);  Surgeon: Rogene Houston, MD;  [facility];  Service: Endoscopy;  Laterality: N/A;   POLYPECTOMY  08/04/2017   Procedure: POLYPECTOMY;  Surgeon: Rogene Houston, MD;  [facility];  Service: Endoscopy;;   ROOT CANAL      Social History   Socioeconomic History   Marital status: Married    Spouse name: Not on file   Number of children: Not on file   Years of education: Not on file   Highest education level: Not on file  Occupational History   Not on file  Tobacco Use   Smoking status: Former    Types: Cigarettes    Quit date: 06/15/1992    Years since quitting: 29.8   Smokeless tobacco: Never  Vaping Use   Vaping Use: Never used  Substance and Sexual Activity   Alcohol use: Never   Drug use: Not Currently    Types: Marijuana    Comment: last used 1970   Sexual activity: Yes  Other  Topics Concern   Not on file  Social History Narrative   Not on file   Social Determinants of Health   Financial Resource Strain: Not on file  Food Insecurity: Not on file  Transportation Needs: Not on file  Physical Activity: Not on file  Stress: Not on file  Social Connections: Not on file    Family History  Problem Relation Age of Onset   Hypertension Mother    Diabetes Mother    Heart disease Mother    Hyperlipidemia Mother    Cancer Father        lung    Outpatient Encounter Medications as of 04/29/2022  Medication Sig   amLODipine (NORVASC) 10 MG tablet Take 1 tablet (10 mg total) by mouth daily.   amoxicillin-clavulanate (AUGMENTIN) 875-125 MG tablet Take 1 tablet by mouth 2 (two) times daily.   aspirin EC 81 MG tablet Take 1 tablet (81 mg total) by mouth daily with breakfast.   Continuous Blood Gluc Sensor (DEXCOM G6 SENSOR) MISC APPLY 1 SENSOR EVERY 10 DAYS FOR GLUCOSE MONITORING.   Continuous Blood Gluc Transmit (DEXCOM G6 TRANSMITTER) MISC USE  AS DIRECTED.   ezetimibe (ZETIA) 10 MG tablet Take 1 tablet (10 mg total) by mouth daily.   fenofibrate 160 MG tablet TAKE 1 TABLET BY MOUTH ONCE A DAY.   indapamide (LOZOL) 2.5 MG tablet TAKE (1) TABLET BY MOUTH ONCE DAILY.   insulin glargine, 2 Unit Dial, (TOUJEO MAX SOLOSTAR) 300 UNIT/ML Solostar Pen Inject 50 Units into the skin at bedtime.   insulin lispro (HUMALOG) 100 UNIT/ML injection Inject 0.15-0.21 mLs (15-21 Units total) into the skin 3 (three) times daily before meals.   Insulin Pen Needle (PEN NEEDLES) 31G X 6 MM MISC Use to inject insulin 4 times daily   metoprolol succinate (TOPROL-XL) 100 MG 24 hr tablet TAKE (1) TABLET BY MOUTH ONCE DAILY.   Multiple Vitamin (MULTIVITAMIN) tablet Take 1 tablet by mouth daily.   Oxycodone HCl 10 MG TABS 1 take 4 times daily as needed pain   oxyCODONE-acetaminophen (PERCOCET) 10-325 MG tablet 1 taken 4 times daily as needed for pain   oxyCODONE-acetaminophen (PERCOCET) 10-325  MG tablet 1  taken 4 times daily as needed for pain   oxyCODONE-acetaminophen (PERCOCET) 10-325 MG tablet 1 taken 4 times daily as needed for pain   pantoprazole (PROTONIX) 40 MG tablet Take 1 tablet (40 mg total) by mouth 2 (two) times daily.   rosuvastatin (CRESTOR) 40 MG tablet Take 1 tablet (40 mg total) by mouth daily.   sildenafil (REVATIO) 20 MG tablet Take 1-5 tablets before sexual activity as directed. No greater that 5 tablets in a day.   triamcinolone cream (KENALOG) 0.1 % APPLY TO AFFECTED AREA SPARINGLY THREE TIMES DAILY.   valsartan (DIOVAN) 160 MG tablet Take 1 tablet (160 mg total) by mouth daily.   [DISCONTINUED] glipiZIDE (GLUCOTROL XL) 5 MG 24 hr tablet TAKE (1) TABLET BY MOUTH ONCE DAILY WITH BREAKFAST.   [DISCONTINUED] insulin regular human CONCENTRATED (HUMULIN R U-500 KWIKPEN) 500 UNIT/ML KwikPen Inject 70-80 Units into the skin 3 (three) times daily with meals. OR AS DIRECTED BY PHYSICIAN   [DISCONTINUED] TECHLITE PEN NEEDLES 32G X 4 MM MISC INJECT 4 TIMES DAILY AS DIRECTED.   glipiZIDE (GLUCOTROL XL) 5 MG 24 hr tablet Take 1 tablet (5 mg total) by mouth daily with breakfast.   No facility-administered encounter medications on file as of 04/29/2022.    ALLERGIES: Allergies  Allergen Reactions   Invokana [Canagliflozin] Other (See Comments)    laryngitis   Trulicity [Dulaglutide] Swelling    Patient also did not tolerate because of blistering in the mouth would avoid all GLP-1's patient was hospitalized with diabetic ketoacidosis August 2023   Altace [Ramipril] Cough   Metformin And Related Diarrhea    VACCINATION STATUS: Immunization History  Administered Date(s) Administered   Influenza,inj,Quad PF,6+ Mos 01/30/2014, 01/29/2016, 01/30/2017, 01/26/2018, 01/23/2019, 01/17/2020, 01/01/2021   Influenza-Unspecified 01/26/2012, 02/21/2015   Pneumococcal Polysaccharide-23 01/30/2014   Td 12/19/2014    Diabetes He presents for his follow-up diabetic visit. He has  type 2 diabetes mellitus. Onset time: Diagnosed at approximate age of 47. His disease course has been fluctuating. There are no hypoglycemic associated symptoms. Pertinent negatives for hypoglycemia include no nervousness/anxiousness. Associated symptoms include fatigue. Pertinent negatives for diabetes include no blurred vision, no polydipsia, no polyphagia, no polyuria and no weight loss. There are no hypoglycemic complications. Nocturnal hypoglycemia: mild. Symptoms are stable. Diabetic complications include nephropathy. Risk factors for coronary artery disease include diabetes mellitus, dyslipidemia, hypertension, male sex, obesity, stress and sedentary lifestyle. Current diabetic treatment includes intensive insulin program and oral  agent (monotherapy). He is compliant with treatment most of the time. His weight is increasing steadily. He is following a generally healthy diet. When asked about meal planning, he reported none. He has not had a previous visit with a dietitian. He participates in exercise intermittently. His home blood glucose trend is fluctuating dramatically. His overall blood glucose range is >200 mg/dl. (He presents today with his CGM and logs showing gross hyperglycemia overall with sudden drops at random times.  His POCT A1c today is 9.8%, increasing from last visit of 9.1%.  He has not missed any doses of insulin, has been rotating his injection sites (he is using a 42m pen needle), and has been eating relatively healthy.  Analysis of his CGM shows TIR 15%, TAR 84%, TBR <2% with a GMI of 9.9%. ) An ACE inhibitor/angiotensin II receptor blocker is being taken. He does not see a podiatrist.Eye exam is not current.  Hypertension This is a chronic problem. The current episode started more than 1 year ago. The problem has been resolved since onset. The problem is controlled. Pertinent negatives include no blurred vision. There are no associated agents to hypertension. Risk factors for coronary  artery disease include diabetes mellitus, dyslipidemia, male gender, obesity and stress. Past treatments include angiotensin blockers and calcium channel blockers. The current treatment provides mild improvement. Compliance problems include diet and exercise.  Hypertensive end-organ damage includes kidney disease. Identifiable causes of hypertension include chronic renal disease.  Hyperlipidemia This is a chronic problem. The current episode started more than 1 year ago. The problem is uncontrolled. Recent lipid tests were reviewed and are variable. Exacerbating diseases include chronic renal disease and diabetes. Factors aggravating his hyperlipidemia include fatty foods. Current antihyperlipidemic treatment includes statins and fibric acid derivatives. The current treatment provides moderate improvement of lipids. Compliance problems include adherence to diet, adherence to exercise and psychosocial issues.  Risk factors for coronary artery disease include diabetes mellitus, dyslipidemia, hypertension, male sex and obesity.    Review of systems  Constitutional: + steadily increasing body weight,  current Body mass index is 29.79 kg/m. , no fatigue, no subjective hyperthermia, no subjective hypothermia Eyes: no blurry vision, no xerophthalmia ENT: no sore throat, no nodules palpated in throat, no dysphagia/odynophagia, no hoarseness Cardiovascular: no chest pain, no shortness of breath, no palpitations, no leg swelling Respiratory: no cough, no shortness of breath Gastrointestinal: no nausea/vomiting/diarrhea Musculoskeletal: no muscle/joint aches Skin: no rashes, no hyperemia Neurological: no tremors, no numbness, no tingling, no dizziness Psychiatric: no depression, no anxiety   Objective:     BP (!) 140/70 (BP Location: Right Arm, Patient Position: Sitting, Cuff Size: Large) Comment: Manuel cuff  Pulse 67   Ht '5\' 10"'$  (1.778 m)   Wt 207 lb 9.6 oz (94.2 kg)   BMI 29.79 kg/m   Wt  Readings from Last 3 Encounters:  04/29/22 207 lb 9.6 oz (94.2 kg)  02/16/22 199 lb 12.8 oz (90.6 kg)  01/21/22 202 lb 12.8 oz (92 kg)    BP Readings from Last 3 Encounters:  04/29/22 (!) 140/70  02/16/22 138/78  01/21/22 123/71     Physical Exam- Limited  Constitutional:  Body mass index is 29.79 kg/m. , not in acute distress, normal state of mind Eyes:  EOMI, no exophthalmos Musculoskeletal: no gross deformities, strength intact in all four extremities, no gross restriction of joint movements Skin:  no rashes, no hyperemia Neurological: no tremor with outstretched hands    Diabetic Foot Exam - Simple  No data filed     CMP ( most recent) CMP     Component Value Date/Time   NA 137 03/17/2022 0917   K 4.6 03/17/2022 0917   CL 98 03/17/2022 0917   CO2 22 03/17/2022 0917   GLUCOSE 391 (H) 03/17/2022 0917   GLUCOSE 264 (H) 11/25/2021 0422   BUN 21 03/17/2022 0917   CREATININE 1.43 (H) 03/17/2022 0917   CREATININE 0.91 02/25/2014 0727   CALCIUM 10.0 03/17/2022 0917   PROT 7.3 03/17/2022 0917   ALBUMIN 4.6 03/17/2022 0917   AST 20 03/17/2022 0917   ALT 24 03/17/2022 0917   ALKPHOS 56 03/17/2022 0917   BILITOT 0.3 03/17/2022 0917   GFRNONAA 52 (L) 11/25/2021 0422   GFRAA 66 01/17/2020 0928     Diabetic Labs (most recent): Lab Results  Component Value Date   HGBA1C 9.8 (A) 04/29/2022   HGBA1C 9.1 10/15/2021   HGBA1C 8.6 (A) 07/09/2021   MICROALBUR 150 07/09/2021   MICROALBUR 150 03/09/2020   MICROALBUR 3.36 (H) 05/11/2013     Lipid Panel ( most recent) Lipid Panel     Component Value Date/Time   CHOL 187 03/17/2022 0917   TRIG 426 (H) 03/17/2022 0917   HDL 28 (L) 03/17/2022 0917   CHOLHDL 6.7 (H) 03/17/2022 0917   CHOLHDL 5.6 08/06/2021 0806   VLDL 52 (H) 08/06/2021 0806   LDLCALC 89 03/17/2022 0917   LABVLDL 70 (H) 03/17/2022 0917      Lab Results  Component Value Date   TSH 0.713 04/30/2021   TSH 1.280 06/29/2020   TSH 1.470 11/11/2019    TSH 2.144 04/04/2019   TSH 0.986 07/01/2015   FREET4 1.39 06/29/2020   FREET4 1.44 11/11/2019           Assessment & Plan:   1) DM type 2 without retinopathy ([place])  - Terry [name] has currently uncontrolled symptomatic type 2 DM since  62 years of age.  He presents today with his CGM and logs showing gross hyperglycemia overall with sudden drops at random times.  His POCT A1c today is 9.8%, increasing from last visit of 9.1%.  He has not missed any doses of insulin, has been rotating his injection sites (he is using a 11m pen needle), and has been eating relatively healthy.  Analysis of his CGM shows TIR 15%, TAR 84%, TBR <2% with a GMI of 9.9%.    Recent labs reviewed.   - I had a long discussion with him about the progressive nature of diabetes and the pathology behind its complications. -his diabetes is complicated by CKD stage 3a and he remains at a high risk for more acute and chronic complications which include CAD, CVA, CKD, retinopathy, and neuropathy. These are all discussed in detail with him.  - Nutritional counseling repeated at each appointment due to patients tendency to fall back in to old habits.  - The patient admits there is a room for improvement in their diet and drink choices. -  Suggestion is made for the patient to avoid simple carbohydrates from their diet including Cakes, Sweet Desserts / Pastries, Ice Cream, Soda (diet and regular), Sweet Tea, Candies, Chips, Cookies, Sweet Pastries, Store Bought Juices, Alcohol in Excess of 1-2 drinks a day, Artificial Sweeteners, Coffee Creamer, and "Sugar-free" Products. This will help patient to have stable blood glucose profile and potentially avoid unintended weight gain.   - I encouraged the patient to switch to unprocessed or minimally processed complex starch and increased  protein intake (animal or plant source), fruits, and vegetables.   - Patient is advised to stick to a routine mealtimes to eat 3 meals a day  and avoid unnecessary snacks (to snack only to correct hypoglycemia).  - I have approached him with the following individualized plan to manage  his diabetes and patient agrees:   -We discussed necessary changes in diet to prevent dramatic fluctuations in glucose and for safety purposes he needs to be eating to inject his insulin.  He did seem to have better response to basal/bolus combo rather than U500.  -Will change him back to basal/bolus combo with Toujeo 50 units SQ nightly and Humalog 15-21 units TID with meals if glucose is above 90 and he is eating (Specific instructions on how to titrate insulin dosage based on glucose readings given to patient in writing).  He can continue Glipizide 5 mg XL daily with breakfast.  He is aware that we will most likely need to adjust his dose in the coming weeks, I encouraged him to reach out in 1 week with readings so we can make necessary adjustments.  I also increased the depth of his insulin needles to allow for better insulin absorption.  -He is encouraged to continue monitoring glucose 4 times daily (using his CGM), before meals and before bed, and to call the clinic if he has readings less than 70 or greater than 300 for 3 tests in a row.  - he is warned not to take insulin without proper monitoring per orders.  -He is NOT candidate for any GLP1 due to severe allergic reaction to Trulicity.  We did also discuss insulin pumps and encouraged him to reach out to his insurance for coverage/price options for Omnipod DASH/5 and or Tandem T-Slim.  - Specific targets for  A1c;  LDL, HDL,  and Triglycerides were discussed with the patient.  2) Blood Pressure /Hypertension: His blood pressure is controlled to target.  He is advised to continue Diovan 160 mg, Amlodipine 10 mg po daily, Indapamide 2.5 mg po daily, and Metoprolol 100 mg po daily.      3) Lipids/Hyperlipidemia:    His most recent lipid panel from 03/17/22 shows controlled LDL of 89 and  significantly elevated triglycerides of 426.  He is advised to continue Crestor 40 mg po daily at bedtime and Fenofibrate 160 mg po daily.  Side effects and precautions discussed with him.  We discussed the need to avoid fried foods and butter to reduce his risk of cardiovascular events.  4)  Weight/Diet:  His Body mass index is 29.79 kg/m.  -   he is a candidate for weight loss. I discussed with him the fact that loss of 5 - 10% of his  current body weight will have the most impact on his diabetes management.  Exercise, and detailed carbohydrates information provided  -  detailed on discharge instructions.  5) Chronic Care/Health Maintenance: -he on ACEI/ARB and Statin medications and  is encouraged to initiate and continue to follow up with Ophthalmology, Dentist, Podiatrist at least yearly or according to recommendations, and advised to stay away from smoking. I have recommended yearly flu vaccine and pneumonia vaccine at least every 5 years; moderate intensity exercise for up to 150 minutes weekly; and  sleep for at least 7 hours a day.  - he is advised to maintain close follow up with Kathyrn Drown, MD for primary care needs, as well as his other providers for optimal and coordinated care.  6) Hypercalcemia:  Patient has had several instances of elevated calcium, with the highest level being at 10.9 mg/dL. A corresponding intact PTH level was low, at 11.   - Patient also has vitamin D deficiency- corrected with supplementation with the last level being 40.6.  - No apparent complications from hypercalcemia/hyperparathyroidism: no history of  nephrolithiasis,  osteoporosis,fragility fractures. No abdominal pain, no major mood disorders, no bone pain.  - At this time the etiology of his hypercalcemia does not appear to be endocrine related.  His PCP had initiated consult to hematologist/oncologist to investigate for other potential causes.  He has been seen by hematologist for workup and was  determined to be taking in too much calcium through supplements such as Tums.  His calcium levels have improved since stopping those supplements.      I spent  48  minutes in the care of the patient today including review of labs from [facility], Lipids, Thyroid Function, Hematology (current and previous including abstractions from other facilities); face-to-face time discussing  his blood glucose readings/logs, discussing hypoglycemia and hyperglycemia episodes and symptoms, medications doses, his options of short and long term treatment based on the latest standards of care / guidelines;  discussion about incorporating lifestyle medicine;  and documenting the encounter. Risk reduction counseling performed per USPSTF guidelines to reduce obesity and cardiovascular risk factors.     Please refer to Patient Instructions for Blood Glucose Monitoring and Insulin/Medications Dosing Guide"  in media tab for additional information. Please  also refer to " Patient Self Inventory" in the Media  tab for reviewed elements of pertinent patient history.  Terry [name] participated in the discussions, expressed understanding, and voiced agreement with the above plans.  All questions were answered to his satisfaction. he is encouraged to contact clinic should he have any questions or concerns prior to his return visit.   Follow up plan: - Return in about 3 months (around 07/28/2022) for Diabetes F/U with A1c in office, No previsit labs, Bring meter and logs.   Rayetta Pigg, [facility] [address] [place], [place] [address] Phone: [phone] Fax: [phone]  04/29/2022, 9:14 AM

## 2022-05-09 ENCOUNTER — Other Ambulatory Visit: Payer: Self-pay | Admitting: Family Medicine

## 2022-05-09 ENCOUNTER — Encounter: Payer: Self-pay | Admitting: Family Medicine

## 2022-05-10 ENCOUNTER — Other Ambulatory Visit: Payer: Self-pay | Admitting: Family Medicine

## 2022-05-10 MED ORDER — OXYCODONE HCL 10 MG PO TABS: ORAL_TABLET | ORAL | 0 refills | Status: DC

## 2022-05-11 MED ORDER — OXYCODONE-ACETAMINOPHEN 10-325 MG PO TABS: ORAL_TABLET | ORAL | 0 refills | Status: DC

## 2022-05-12 ENCOUNTER — Telehealth: Payer: Self-pay | Admitting: *Deleted

## 2022-05-12 NOTE — Telephone Encounter (Signed)
Patient called and ask if he needs to increase or decrease his insulins. After reviewing his Dexcom , per whitney the patient will nee to increase his Toujeo to 65 units , his Humalog to 18-24 units and he is to continue his glipizide.  Patient was called and made aware.

## 2022-05-20 ENCOUNTER — Ambulatory Visit (INDEPENDENT_AMBULATORY_CARE_PROVIDER_SITE_OTHER): Payer: BC Managed Care – PPO | Admitting: Family Medicine

## 2022-05-20 VITALS — BP 138/78 | HR 85 | Wt 212.2 lb

## 2022-05-20 DIAGNOSIS — Z79891 Long term (current) use of opiate analgesic: Secondary | ICD-10-CM | POA: Diagnosis not present

## 2022-05-20 MED ORDER — OXYCODONE-ACETAMINOPHEN 10-325 MG PO TABS: ORAL_TABLET | ORAL | 0 refills | Status: DC

## 2022-05-20 NOTE — Progress Notes (Addendum)
Subjective:    Patient ID: Terry [name], male    DOB: 1960-12-11, 62 y.o.   MRN: [id number]  HPI This patient was seen today for chronic pain  The medication list was reviewed and updated.  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  Tylenol alone does not alleviate his discomfort He has been on pain management for over 10 years.  He currently takes oxycodone 4 times per day.  Urine drug screen is followed on a yearly basis.  Patient signs of pain management contract yearly.  Guidelines from the [facility] as well as national pain management guidelines are followed.  He does benefit from the pain management and the medication. Onset of this pain: Present for years   -Compliance with medication: Overall good compliance with medicine  - Number patient states they take daily: 4 daily  -when was the last dose patient took? 0700 this am   The patient was advised the importance of maintaining medication and not using illegal substances with these.  Here for refills and follow up  The patient was educated that we can provide 3 monthly scripts for their medication, it is their responsibility to follow the instructions.  Side effects or complications from medications: Denies side effects  Patient is aware that pain medications are meant to minimize the severity of the pain to allow their pain levels to improve to allow for better function. They are aware of that pain medications cannot totally remove their pain.  Due for UDT ( at least once per year) : 10/22/2021  Scale of 1 to 10 ( 1 is least 10 is most) Your pain level without the medicine:  8 Your pain level with medication 4  Scale 1 to 10 ( 1-helps very little, 10 helps very well) How well does your pain medication reduce your pain so you can function better through out the day? 7  Quality of the pain: Burning pain aching  Persistence of the pain: Present all the time  Modifying factors: Worse with activity         Review of Systems     Objective:   Physical Exam  General-in no acute distress Eyes-no discharge Lungs-respiratory rate normal, CTA CV-no murmurs,RRR Extremities skin warm dry no edema Neuro grossly normal Behavior normal, alert       Assessment & Plan:  Diabetes under the care of of endocrinology Glucose is consistently running in the 270 Patient taking 65 units of long-acting Asked what he could do to help bring his numbers down I recommend increasing long-acting to 70 units and then give updates to his endocrinology for further guidance  The patient was seen in followup for chronic pain. A review over at their current pain status was discussed. Drug registry was checked. Prescriptions were given.  Regular follow-up recommended. Discussion was held regarding the importance of compliance with medication as well as pain medication contract.  Patient was informed that medication may cause drowsiness and should not be combined  with other medications/alcohol or street drugs. If the patient feels medication is causing altered alertness then do not drive or operate dangerous equipment.  Should be noted that the patient  appears to be meeting appropriate use of opioids and response.  Evidenced by improved function and decent pain control without significant side effects and no evidence of overt aberrancy issues.  Upon discussion with the patient today they understand that opioid therapy is optional and they feel that the pain has been refractory to reasonable conservative measures and is significant and affecting quality of life enough to warrant  ongoing therapy and wishes to continue opioids.  Refills were provided.

## 2022-05-26 ENCOUNTER — Encounter: Payer: Self-pay | Admitting: Radiology

## 2022-05-26 ENCOUNTER — Other Ambulatory Visit: Payer: Self-pay | Admitting: "Endocrinology

## 2022-05-31 ENCOUNTER — Other Ambulatory Visit: Payer: Self-pay | Admitting: Nurse Practitioner

## 2022-06-01 ENCOUNTER — Other Ambulatory Visit: Payer: Self-pay | Admitting: Nurse Practitioner

## 2022-06-10 ENCOUNTER — Other Ambulatory Visit: Payer: Self-pay | Admitting: Family Medicine

## 2022-06-23 ENCOUNTER — Other Ambulatory Visit: Payer: Self-pay

## 2022-06-23 ENCOUNTER — Telehealth: Payer: Self-pay | Admitting: Family Medicine

## 2022-06-23 MED ORDER — PANTOPRAZOLE SODIUM 40 MG PO TBEC: 40.0000 mg | DELAYED_RELEASE_TABLET | Freq: Two times a day (BID) | ORAL | 2 refills | Status: DC

## 2022-06-23 NOTE — Telephone Encounter (Signed)
Patient is requesting refill on pantoprazole 40 mg to called into [facility]

## 2022-06-30 ENCOUNTER — Other Ambulatory Visit: Payer: Self-pay | Admitting: Family Medicine

## 2022-07-11 ENCOUNTER — Other Ambulatory Visit: Payer: Self-pay | Admitting: Nurse Practitioner

## 2022-07-12 MED ORDER — DEXCOM G6 TRANSMITTER MISC
3 refills | Status: DC
Start: 2022-07-12 — End: 2022-07-29

## 2022-07-18 ENCOUNTER — Ambulatory Visit (INDEPENDENT_AMBULATORY_CARE_PROVIDER_SITE_OTHER): Payer: BC Managed Care – PPO

## 2022-07-18 ENCOUNTER — Ambulatory Visit (INDEPENDENT_AMBULATORY_CARE_PROVIDER_SITE_OTHER): Payer: BC Managed Care – PPO | Admitting: Podiatry

## 2022-07-18 DIAGNOSIS — M722 Plantar fascial fibromatosis: Secondary | ICD-10-CM | POA: Diagnosis not present

## 2022-07-18 MED ORDER — BETAMETHASONE SOD PHOS & ACET 6 (3-3) MG/ML IJ SUSP
3.0000 mg | Freq: Once | INTRAMUSCULAR | Status: AC
Start: 2022-07-18 — End: 2022-07-18
  Administered 2022-07-18: 3 mg via INTRA_ARTICULAR

## 2022-07-18 MED ORDER — METHYLPREDNISOLONE 4 MG PO TBPK: ORAL_TABLET | ORAL | 0 refills | Status: DC

## 2022-07-18 NOTE — Progress Notes (Signed)
   Chief Complaint  Patient presents with   Plantar Fasciitis    Patient came in today for left heel pain, patient states he has plantar fascitis and 3 bones spurs, Started 3 weeks, Rate of pain 20 out of 10, patient is unable to put he heel on the floor because of the pain, Sharp shooting pain, X-rays done today     Subjective: 62 y.o. male presenting today for evaluation of left heel pain.  Patient states that he has been diagnosed with plantar fasciitis in the past.  He has received injections up in [place], [place] by another podiatrist.  The injections seem to help temporarily.  He also has taken diclofenac in the past but states that recently he had an episode of impaired renal function and currently not taking NSAIDs.   Past Medical History:  Diagnosis Date   Diabetes mellitus without complication (HCC) 2004   GERD (gastroesophageal reflux disease)    Hyperlipidemia    Hypertension    Osteoarthritis      Objective: Physical Exam General: The patient is alert and oriented x3 in no acute distress.  Dermatology: Skin is warm, dry and supple bilateral lower extremities. Negative for open lesions or macerations bilateral.  There is a focal skin lesion also noted to the lateral aspect of the left ankle.  It is currently not open but there is a well adhered eschar/scab with associated tenderness.  This directly correlates on radiographic exam with an underlying screw from prior ORIF of the left ankle  Vascular: Dorsalis Pedis and Posterior Tibial pulses palpable bilateral.  Capillary fill time is immediate to all digits.  Neurological: Epicritic and protective threshold intact bilateral.   Musculoskeletal: Tenderness to palpation to the plantar aspect of the left heel along the plantar fascia. All other joints range of motion within normal limits bilateral. Strength 5/5 in all groups bilateral.   Radiographic exam LT foot 07/18/2022: Normal osseous mineralization. Joint spaces preserved. No  fracture/dislocation/boney destruction. No other soft tissue abnormalities or radiopaque foreign bodies.  Orthopedic hardware noted to the left ankle  Assessment: 1. Plantar fasciitis left foot 2.  Symptomatic skin lesion lateral aspect of left ankle correlating directly to underlying orthopedic hardware from prior ankle surger x 15 years ago  Plan of Care:  1. Patient evaluated. Xrays reviewed.   2. Injection of 0.5cc Celestone soluspan injected into the left plantar fascia.  3. Rx for Medrol Dose Pak placed 4.  Patient has history of impaired renal function.  No NSAIDs prescribed 5.  OTC prefabricated insoles dispensed today to wear in his work boots.  Ultimately declined cam boot 6. Instructed patient regarding therapies and modalities at home to alleviate symptoms.  7. Return to clinic in 3 weeks  *Special educational needs teacher for [employer]   Felecia Shelling, [place] [facility]  Dr. Felecia Shelling, DPM    [address] [place], [place] [address]                Office [phone]  Fax [phone]

## 2022-07-25 ENCOUNTER — Other Ambulatory Visit: Payer: Self-pay | Admitting: Family Medicine

## 2022-07-26 ENCOUNTER — Other Ambulatory Visit: Payer: Self-pay | Admitting: Podiatry

## 2022-07-26 DIAGNOSIS — M722 Plantar fascial fibromatosis: Secondary | ICD-10-CM

## 2022-07-29 ENCOUNTER — Encounter: Payer: Self-pay | Admitting: Nurse Practitioner

## 2022-07-29 ENCOUNTER — Ambulatory Visit (INDEPENDENT_AMBULATORY_CARE_PROVIDER_SITE_OTHER): Payer: BC Managed Care – PPO | Admitting: Nurse Practitioner

## 2022-07-29 VITALS — BP 136/78 | HR 73 | Ht 70.0 in | Wt 205.2 lb

## 2022-07-29 DIAGNOSIS — E782 Mixed hyperlipidemia: Secondary | ICD-10-CM | POA: Diagnosis not present

## 2022-07-29 DIAGNOSIS — N1831 Chronic kidney disease, stage 3a: Secondary | ICD-10-CM | POA: Diagnosis not present

## 2022-07-29 DIAGNOSIS — I10 Essential (primary) hypertension: Secondary | ICD-10-CM

## 2022-07-29 DIAGNOSIS — Z794 Long term (current) use of insulin: Secondary | ICD-10-CM

## 2022-07-29 DIAGNOSIS — E1122 Type 2 diabetes mellitus with diabetic chronic kidney disease: Secondary | ICD-10-CM | POA: Diagnosis not present

## 2022-07-29 LAB — POCT GLYCOSYLATED HEMOGLOBIN (HGB A1C): Hemoglobin A1C: 10.2 % — AB (ref 4.0–5.6)

## 2022-07-29 MED ORDER — DEXCOM G6 TRANSMITTER MISC: 3 refills | Status: DC

## 2022-07-29 MED ORDER — TOUJEO MAX SOLOSTAR 300 UNIT/ML ~~LOC~~ SOPN: 90.0000 [IU] | PEN_INJECTOR | Freq: Every evening | SUBCUTANEOUS | 3 refills | Status: DC

## 2022-07-29 MED ORDER — INSULIN LISPRO 100 UNIT/ML IJ SOLN
25.0000 [IU] | Freq: Three times a day (TID) | INTRAMUSCULAR | 3 refills | Status: DC
Start: 2022-07-29 — End: 2022-11-04

## 2022-07-29 MED ORDER — DEXCOM G6 SENSOR MISC: 3 refills | Status: DC

## 2022-07-29 NOTE — Progress Notes (Signed)
Endocrinology Follow Up Visit      07/29/2022, 8:39 AM   Subjective:    Patient ID: Terry [name], male    DOB: Dec 08, 1960.  Terry [name] is being seen in follow up after being seen in consultation for management of currently uncontrolled symptomatic diabetes requested by  Babs Sciara, MD.   Past Medical History:  Diagnosis Date   Diabetes mellitus without complication (HCC) 2004   GERD (gastroesophageal reflux disease)    Hyperlipidemia    Hypertension    Osteoarthritis     Past Surgical History:  Procedure Laterality Date   ANKLE SURGERY Left 03/28/2005   2 plates/screws   COLONOSCOPY N/A 08/04/2017   Procedure: COLONOSCOPY;  Surgeon: Malissa Hippo, MD;  [facility]: [facility];  Service: Endoscopy;  Laterality: N/A;   ESOPHAGEAL DILATION N/A 08/04/2017   Procedure: ESOPHAGEAL DILATION;  Surgeon: Malissa Hippo, MD;  [facility]: [facility];  Service: Endoscopy;  Laterality: N/A;   ESOPHAGOGASTRODUODENOSCOPY N/A 08/04/2017   Procedure: ESOPHAGOGASTRODUODENOSCOPY (EGD);  Surgeon: Malissa Hippo, MD;  [facility]: [facility];  Service: Endoscopy;  Laterality: N/A;   POLYPECTOMY  08/04/2017   Procedure: POLYPECTOMY;  Surgeon: Malissa Hippo, MD;  Location: [facility];  Service: Endoscopy;;   ROOT CANAL      Social History   Socioeconomic History   Marital status: Married    Spouse name: Not on file   Number of children: Not on file   Years of education: Not on file   Highest education level: Not on file  Occupational History   Not on file  Tobacco Use   Smoking status: Former    Types: Cigarettes    Quit date: 06/15/1992    Years since quitting: 30.1   Smokeless tobacco: Never  Vaping Use   Vaping Use: Never used  Substance and Sexual Activity   Alcohol use: Never   Drug use: Not Currently    Types: Marijuana    Comment: last used 1970   Sexual activity: Yes  Other  Topics Concern   Not on file  Social History Narrative   Not on file   Social Determinants of Health   Financial Resource Strain: Not on file  Food Insecurity: Not on file  Transportation Needs: Not on file  Physical Activity: Not on file  Stress: Not on file  Social Connections: Not on file    Family History  Problem Relation Age of Onset   Hypertension Mother    Diabetes Mother    Heart disease Mother    Hyperlipidemia Mother    Cancer Father        lung    Outpatient Encounter Medications as of 07/29/2022  Medication Sig   amLODipine (NORVASC) 10 MG tablet Take 1 tablet (10 mg total) by mouth daily.   aspirin EC 81 MG tablet Take 1 tablet (81 mg total) by mouth daily with breakfast.   ezetimibe (ZETIA) 10 MG tablet Take 1 tablet (10 mg total) by mouth daily.   fenofibrate 160 MG tablet TAKE 1 TABLET BY MOUTH ONCE A DAY.   glipiZIDE (GLUCOTROL XL) 5 MG 24 hr tablet Take 1 tablet (5 mg total)  by mouth daily with breakfast.   indapamide (LOZOL) 2.5 MG tablet TAKE (1) TABLET BY MOUTH ONCE DAILY.   Insulin Pen Needle (PEN NEEDLES) 31G X 6 MM MISC Use to inject insulin 4 times daily   methylPREDNISolone (MEDROL DOSEPAK) 4 MG TBPK tablet 6 day dose pack - take as directed   metoprolol succinate (TOPROL-XL) 100 MG 24 hr tablet TAKE (1) TABLET BY MOUTH ONCE DAILY.   Multiple Vitamin (MULTIVITAMIN) tablet Take 1 tablet by mouth daily.   oxyCODONE-acetaminophen (PERCOCET) 10-325 MG tablet 1 taken 4 times daily as needed for pain   oxyCODONE-acetaminophen (PERCOCET) 10-325 MG tablet 1 taken 4 times daily as needed for pain   oxyCODONE-acetaminophen (PERCOCET) 10-325 MG tablet 1  taken 4 times daily as needed for pain   pantoprazole (PROTONIX) 40 MG tablet Take 1 tablet (40 mg total) by mouth 2 (two) times daily.   rosuvastatin (CRESTOR) 40 MG tablet TAKE (1) TABLET BY MOUTH ONCE DAILY.   sildenafil (REVATIO) 20 MG tablet Take 1-5 tablets before sexual activity as directed. No greater  that 5 tablets in a day.   triamcinolone cream (KENALOG) 0.1 % APPLY TO AFFECTED AREA SPARINGLY THREE TIMES DAILY.   valsartan (DIOVAN) 160 MG tablet Take 1 tablet (160 mg total) by mouth daily.   [DISCONTINUED] Continuous Blood Gluc Sensor (DEXCOM G6 SENSOR) MISC APPLY 1 SENSOR EVERY 10 DAYS FOR GLUCOSE MONITORING.   [DISCONTINUED] Continuous Glucose Transmitter (DEXCOM G6 TRANSMITTER) MISC Change transmitter every 90 days   [DISCONTINUED] insulin glargine, 2 Unit Dial, (TOUJEO MAX SOLOSTAR) 300 UNIT/ML Solostar Pen Inject 50 Units into the skin at bedtime.   [DISCONTINUED] insulin lispro (HUMALOG) 100 UNIT/ML injection Inject 0.15-0.21 mLs (15-21 Units total) into the skin 3 (three) times daily before meals.   Continuous Glucose Sensor (DEXCOM G6 SENSOR) MISC Change sensor every 10 days   Continuous Glucose Transmitter (DEXCOM G6 TRANSMITTER) MISC Change transmitter every 90 days   insulin glargine, 2 Unit Dial, (TOUJEO MAX SOLOSTAR) 300 UNIT/ML Solostar Pen Inject 90 Units into the skin at bedtime.   insulin lispro (HUMALOG) 100 UNIT/ML injection Inject 0.25-0.31 mLs (25-31 Units total) into the skin 3 (three) times daily before meals.   [DISCONTINUED] indapamide (LOZOL) 2.5 MG tablet TAKE (1) TABLET BY MOUTH ONCE DAILY.   No facility-administered encounter medications on file as of 07/29/2022.    ALLERGIES: Allergies  Allergen Reactions   Invokana [Canagliflozin] Other (See Comments)    laryngitis   Trulicity [Dulaglutide] Swelling    Patient also did not tolerate because of blistering in the mouth would avoid all GLP-1's patient was hospitalized with diabetic ketoacidosis August 2023   Altace [Ramipril] Cough   Metformin And Related Diarrhea    VACCINATION STATUS: Immunization History  Administered Date(s) Administered   Influenza,inj,Quad PF,6+ Mos 01/30/2014, 01/29/2016, 01/30/2017, 01/26/2018, 01/23/2019, 01/17/2020, 01/01/2021   Influenza-Unspecified 01/26/2012, 02/21/2015,  01/10/2022   Pneumococcal Polysaccharide-23 01/30/2014   Td 12/19/2014    Diabetes He presents for his follow-up diabetic visit. He has type 2 diabetes mellitus. Onset time: Diagnosed at approximate age of 55. His disease course has been fluctuating. There are no hypoglycemic associated symptoms. Pertinent negatives for hypoglycemia include no nervousness/anxiousness. Associated symptoms include fatigue. Pertinent negatives for diabetes include no blurred vision, no polydipsia, no polyphagia, no polyuria and no weight loss. There are no hypoglycemic complications. Nocturnal hypoglycemia: mild. Symptoms are stable. Diabetic complications include nephropathy. Risk factors for coronary artery disease include diabetes mellitus, dyslipidemia, hypertension, male sex, obesity, stress and sedentary  lifestyle. Current diabetic treatment includes intensive insulin program and oral agent (monotherapy). He is compliant with treatment most of the time. His weight is fluctuating minimally. He is following a generally healthy diet. When asked about meal planning, he reported none. He has not had a previous visit with a dietitian. He participates in exercise intermittently. His home blood glucose trend is fluctuating dramatically. His overall blood glucose range is >200 mg/dl. (He presents today with his CGM and logs showing gross hyperglycemia overall with sudden drops at random times.  His POCT A1c today is 10.2%, increasing from last visit of 9.8%.  He has not missed any doses of insulin, has been rotating his injection sites (he is using a 6 mm pen needle), and has been eating relatively healthy.  He has had steroid injections in his foot for plantar fascitis and is also being evaluated for surgery given hardware complications of left ankle.  Analysis of his CGM shows TIR 5%, TAR 95% (Level 2 hyperglycemia 69%), TBR 0% with a GMI of 10.4%.) An ACE inhibitor/angiotensin II receptor blocker is being taken. He does not see a  podiatrist.Eye exam is not current.  Hypertension This is a chronic problem. The current episode started more than 1 year ago. The problem has been resolved since onset. The problem is controlled. Pertinent negatives include no blurred vision. There are no associated agents to hypertension. Risk factors for coronary artery disease include diabetes mellitus, dyslipidemia, male gender, obesity and stress. Past treatments include angiotensin blockers and calcium channel blockers. The current treatment provides mild improvement. Compliance problems include diet and exercise.  Hypertensive end-organ damage includes kidney disease. Identifiable causes of hypertension include chronic renal disease.  Hyperlipidemia This is a chronic problem. The current episode started more than 1 year ago. The problem is uncontrolled. Recent lipid tests were reviewed and are variable. Exacerbating diseases include chronic renal disease and diabetes. Factors aggravating his hyperlipidemia include fatty foods. Current antihyperlipidemic treatment includes statins and fibric acid derivatives. The current treatment provides moderate improvement of lipids. Compliance problems include adherence to diet, adherence to exercise and psychosocial issues.  Risk factors for coronary artery disease include diabetes mellitus, dyslipidemia, hypertension, male sex and obesity.    Review of systems  Constitutional: + Minimally fluctuating body weight,  current Body mass index is 29.44 kg/m. , no fatigue, no subjective hyperthermia, no subjective hypothermia Eyes: no blurry vision, no xerophthalmia ENT: no sore throat, no nodules palpated in throat, no dysphagia/odynophagia, no hoarseness Cardiovascular: no chest pain, no shortness of breath, no palpitations, no leg swelling Respiratory: no cough, no shortness of breath Gastrointestinal: no nausea/vomiting/diarrhea Musculoskeletal: no muscle/joint aches Skin: no rashes, no  hyperemia Neurological: no tremors, no numbness, no tingling, no dizziness Psychiatric: no depression, no anxiety   Objective:     BP 136/78 (BP Location: Right Arm, Patient Position: Sitting, Cuff Size: Large)   Pulse 73   Ht 5\' 10"  (1.778 m)   Wt 205 lb 3.2 oz (93.1 kg)   BMI 29.44 kg/m   Wt Readings from Last 3 Encounters:  07/29/22 205 lb 3.2 oz (93.1 kg)  05/20/22 212 lb 3.2 oz (96.3 kg)  04/29/22 207 lb 9.6 oz (94.2 kg)    BP Readings from Last 3 Encounters:  07/29/22 136/78  05/20/22 138/78  04/29/22 (!) 140/70      Physical Exam- Limited  Constitutional:  Body mass index is 29.44 kg/m. , not in acute distress, normal state of mind Eyes:  EOMI, no exophthalmos Musculoskeletal:  no gross deformities, strength intact in all four extremities, no gross restriction of joint movements Skin:  no rashes, no hyperemia Neurological: no tremor with outstretched hands    Diabetic Foot Exam - Simple   No data filed     CMP ( most recent) CMP     Component Value Date/Time   NA 137 03/17/2022 0917   K 4.6 03/17/2022 0917   CL 98 03/17/2022 0917   CO2 22 03/17/2022 0917   GLUCOSE 391 (H) 03/17/2022 0917   GLUCOSE 264 (H) 11/25/2021 0422   BUN 21 03/17/2022 0917   CREATININE 1.43 (H) 03/17/2022 0917   CREATININE 0.91 02/25/2014 0727   CALCIUM 10.0 03/17/2022 0917   PROT 7.3 03/17/2022 0917   ALBUMIN 4.6 03/17/2022 0917   AST 20 03/17/2022 0917   ALT 24 03/17/2022 0917   ALKPHOS 56 03/17/2022 0917   BILITOT 0.3 03/17/2022 0917   GFRNONAA 52 (L) 11/25/2021 0422   GFRAA 66 01/17/2020 0928     Diabetic Labs (most recent): Lab Results  Component Value Date   HGBA1C 10.2 (A) 07/29/2022   HGBA1C 9.8 (A) 04/29/2022   HGBA1C 9.1 10/15/2021   MICROALBUR 150 07/09/2021   MICROALBUR 150 03/09/2020   MICROALBUR 3.36 (H) 05/11/2013     Lipid Panel ( most recent) Lipid Panel     Component Value Date/Time   CHOL 187 03/17/2022 0917   TRIG 426 (H) 03/17/2022  0917   HDL 28 (L) 03/17/2022 0917   CHOLHDL 6.7 (H) 03/17/2022 0917   CHOLHDL 5.6 08/06/2021 0806   VLDL 52 (H) 08/06/2021 0806   LDLCALC 89 03/17/2022 0917   LABVLDL 70 (H) 03/17/2022 0917      Lab Results  Component Value Date   TSH 0.713 04/30/2021   TSH 1.280 06/29/2020   TSH 1.470 11/11/2019   TSH 2.144 04/04/2019   TSH 0.986 07/01/2015   FREET4 1.39 06/29/2020   FREET4 1.44 11/11/2019           Assessment & Plan:   1) DM type 2 without retinopathy (HCC)  - Terry [name] has currently uncontrolled symptomatic type 2 DM since  62 years of age.  He presents today with his CGM and logs showing gross hyperglycemia overall with sudden drops at random times.  His POCT A1c today is 10.2%, increasing from last visit of 9.8%.  He has not missed any doses of insulin, has been rotating his injection sites (he is using a 6 mm pen needle), and has been eating relatively healthy.  He has had steroid injections in his foot for plantar fascitis and is also being evaluated for surgery given hardware complications of left ankle.  Analysis of his CGM shows TIR 5%, TAR 95% (Level 2 hyperglycemia 69%), TBR 0% with a GMI of 10.4%.   Recent labs reviewed.   - I had a long discussion with him about the progressive nature of diabetes and the pathology behind its complications. -his diabetes is complicated by CKD stage 3a and he remains at a high risk for more acute and chronic complications which include CAD, CVA, CKD, retinopathy, and neuropathy. These are all discussed in detail with him.  - Nutritional counseling repeated at each appointment due to patients tendency to fall back in to old habits.  - The patient admits there is a room for improvement in their diet and drink choices. -  Suggestion is made for the patient to avoid simple carbohydrates from their diet including Cakes, Sweet Desserts /  Pastries, Ice Cream, Soda (diet and regular), Sweet Tea, Candies, Chips, Cookies, Sweet  Pastries, Store Bought Juices, Alcohol in Excess of 1-2 drinks a day, Artificial Sweeteners, Coffee Creamer, and "Sugar-free" Products. This will help patient to have stable blood glucose profile and potentially avoid unintended weight gain.   - I encouraged the patient to switch to unprocessed or minimally processed complex starch and increased protein intake (animal or plant source), fruits, and vegetables.   - Patient is advised to stick to a routine mealtimes to eat 3 meals a day and avoid unnecessary snacks (to snack only to correct hypoglycemia).  - I have approached him with the following individualized plan to manage  his diabetes and patient agrees:   -We discussed necessary changes in diet to prevent dramatic fluctuations in glucose and for safety purposes he needs to be eating to inject his insulin.  He did seem to have better response to basal/bolus combo rather than U500.  -He is advised to increase his Toujeo to 90 units SQ nightly and adjust his Humalog to 25-31 units TID with meals if glucose is above 90 and he is eating (Specific instructions on how to titrate insulin dosage based on glucose readings given to patient in writing).  He can also continue Glipizide 5 mg XL daily with breakfast.  His diabetes is complicated due to steroid injections on top of his significant insulin resistance.  I asked that he reach out in 2 weeks so I can pull his CGM report and continue to tweak his insulin to get him closer to goal.  -He is encouraged to continue monitoring glucose 4 times daily (using his CGM), before meals and before bed, and to call the clinic if he has readings less than 70 or greater than 300 for 3 tests in a row.  - he is warned not to take insulin without proper monitoring per orders.  -He is NOT candidate for any GLP1 due to severe allergic reaction to Trulicity.  We did also discuss insulin pumps and encouraged him to reach out to his insurance for coverage/price options  for Omnipod DASH/5 and or Tandem T-Slim.  Says all of these options are too expensive for his budget right now.  - Specific targets for  A1c;  LDL, HDL,  and Triglycerides were discussed with the patient.  2) Blood Pressure /Hypertension: His blood pressure is controlled to target.  He is advised to continue Diovan 160 mg, Amlodipine 10 mg po daily, Indapamide 2.5 mg po daily, and Metoprolol 100 mg po daily.      3) Lipids/Hyperlipidemia:    His most recent lipid panel from 03/17/22 shows controlled LDL of 89 and significantly elevated triglycerides of 426.  He is advised to continue Crestor 40 mg po daily at bedtime and Fenofibrate 160 mg po daily.  Side effects and precautions discussed with him.  We discussed the need to avoid fried foods and butter to reduce his risk of cardiovascular events.  4)  Weight/Diet:  His Body mass index is 29.44 kg/m.  -   he is a candidate for weight loss. I discussed with him the fact that loss of 5 - 10% of his  current body weight will have the most impact on his diabetes management.  Exercise, and detailed carbohydrates information provided  -  detailed on discharge instructions.  5) Chronic Care/Health Maintenance: -he on ACEI/ARB and Statin medications and  is encouraged to initiate and continue to follow up with Ophthalmology, Dentist, Podiatrist at  least yearly or according to recommendations, and advised to stay away from smoking. I have recommended yearly flu vaccine and pneumonia vaccine at least every 5 years; moderate intensity exercise for up to 150 minutes weekly; and  sleep for at least 7 hours a day.  - he is advised to maintain close follow up with Babs Sciara, MD for primary care needs, as well as his other providers for optimal and coordinated care.     I spent  49  minutes in the care of the patient today including review of labs from CMP, Lipids, Thyroid Function, Hematology (current and previous including abstractions from other  facilities); face-to-face time discussing  his blood glucose readings/logs, discussing hypoglycemia and hyperglycemia episodes and symptoms, medications doses, his options of short and long term treatment based on the latest standards of care / guidelines;  discussion about incorporating lifestyle medicine;  and documenting the encounter. Risk reduction counseling performed per USPSTF guidelines to reduce obesity and cardiovascular risk factors.     Please refer to Patient Instructions for Blood Glucose Monitoring and Insulin/Medications Dosing Guide"  in media tab for additional information. Please  also refer to " Patient Self Inventory" in the Media  tab for reviewed elements of pertinent patient history.  Terry [name] participated in the discussions, expressed understanding, and voiced agreement with the above plans.  All questions were answered to his satisfaction. he is encouraged to contact clinic should he have any questions or concerns prior to his return visit.   Follow up plan: - Return in about 3 months (around 10/29/2022) for Diabetes F/U with A1c in office, No previsit labs, Bring meter and logs.   Ronny Bacon, [facility] [address] [place], [place] [address] Phone: [phone] Fax: [phone]  07/29/2022, 8:39 AM

## 2022-08-09 ENCOUNTER — Telehealth: Payer: Self-pay

## 2022-08-09 ENCOUNTER — Telehealth: Payer: Self-pay | Admitting: *Deleted

## 2022-08-09 NOTE — Telephone Encounter (Signed)
Patient called and states that his sensor is going to be running out tomorrow. He states that it was his understanding that this was done last Friday, the PA was sent.  Patient advised that we would follow up with our PA team for a status on this.

## 2022-08-09 NOTE — Telephone Encounter (Signed)
Patient Advocate Encounter   Received notification from [facility] that prior authorization is required for [facility] Max SoloStar 300UNIT/ML pen-injectors  Submitted: 08/09/22 Key [id number]  Status is pending

## 2022-08-09 NOTE — Telephone Encounter (Signed)
Patient Advocate Encounter   Received notification from pt msgs that prior authorization is required for Dexcom G6 sensor  Submitted: 08/09/22 Key [id number]  Status is pending

## 2022-08-09 NOTE — Telephone Encounter (Signed)
Patient Advocate Encounter   Received notification from pt msgs that prior authorization is required for Dexcom G6 transmitter  Submitted: 08/09/22 Key [id number]  Status is pending

## 2022-08-10 ENCOUNTER — Telehealth: Payer: Self-pay | Admitting: *Deleted

## 2022-08-10 NOTE — Telephone Encounter (Signed)
Thank you so much

## 2022-08-10 NOTE — Telephone Encounter (Signed)
Patient called and states that his blood sugars have been running high, and that he needs a Dexcom G6.  The G 6 needs a PA.  Patient's report of blood sugars have been printed. PA team was notified. After review of the patient's results Whitney advised that the patient should increase Toujeo to 100 units and increase Humalog to 30-36 units. PA team submitted a PA for the Toujeo and the Dexcom yesterday .  Patient has been called and made aware. He will to finger sticks until Dexcom G 6 is approved.

## 2022-08-12 ENCOUNTER — Ambulatory Visit (INDEPENDENT_AMBULATORY_CARE_PROVIDER_SITE_OTHER): Payer: BC Managed Care – PPO | Admitting: Podiatry

## 2022-08-12 ENCOUNTER — Encounter: Payer: Self-pay | Admitting: Family Medicine

## 2022-08-12 ENCOUNTER — Telehealth: Payer: Self-pay

## 2022-08-12 DIAGNOSIS — M722 Plantar fascial fibromatosis: Secondary | ICD-10-CM

## 2022-08-12 MED ORDER — BETAMETHASONE SOD PHOS & ACET 6 (3-3) MG/ML IJ SUSP
3.0000 mg | Freq: Once | INTRAMUSCULAR | Status: AC
Start: 2022-08-12 — End: 2022-08-12
  Administered 2022-08-12: 3 mg via INTRA_ARTICULAR

## 2022-08-12 NOTE — Progress Notes (Signed)
Chief Complaint  Patient presents with   Plantar Fasciitis    Left foot hell follow-up, patient is doing better but still has pain by the end of the day, rat of pain 10 out of 10 after walking all day, injection seem to help     Subjective: 62 y.o. male presenting today for evaluation of left heel pain.  Patient has noticed some slight improvement over the past month.  He says that the OTC power step insoles helped tremendously.  Patient has noticed over the past few weeks that the pain is slowly returned   Past Medical History:  Diagnosis Date   Diabetes mellitus without complication (HCC) 2004   GERD (gastroesophageal reflux disease)    Hyperlipidemia    Hypertension    Osteoarthritis    Past Surgical History:  Procedure Laterality Date   ANKLE SURGERY Left 03/28/2005   2 plates/screws   COLONOSCOPY N/A 08/04/2017   Procedure: COLONOSCOPY;  Surgeon: Malissa Hippo, MD;  [facility];  Service: Endoscopy;  Laterality: N/A;   ESOPHAGEAL DILATION N/A 08/04/2017   Procedure: ESOPHAGEAL DILATION;  Surgeon: Malissa Hippo, MD;  [facility];  Service: Endoscopy;  Laterality: N/A;   ESOPHAGOGASTRODUODENOSCOPY N/A 08/04/2017   Procedure: ESOPHAGOGASTRODUODENOSCOPY (EGD);  Surgeon: Malissa Hippo, MD;  [facility];  Service: Endoscopy;  Laterality: N/A;   POLYPECTOMY  08/04/2017   Procedure: POLYPECTOMY;  Surgeon: Malissa Hippo, MD;  [facility];  Service: Endoscopy;;   ROOT CANAL     Allergies  Allergen Reactions   Invokana [Canagliflozin] Other (See Comments)    laryngitis   Trulicity [Dulaglutide] Swelling    Patient also did not tolerate because of blistering in the mouth would avoid all GLP-1's patient was hospitalized with diabetic ketoacidosis August 2023   Altace [Ramipril] Cough   Metformin And Related Diarrhea     Objective: Physical Exam General: The patient is alert and oriented x3 in no acute  distress.  Dermatology: Skin is warm, dry and supple bilateral lower extremities. Negative for open lesions or macerations bilateral.  There is a focal skin lesion also noted to the lateral aspect of the left ankle.  It is currently not open but there is a well adhered eschar/scab with associated tenderness.  This directly correlates on radiographic exam with an underlying screw from prior ORIF of the left ankle  Vascular: Dorsalis Pedis and Posterior Tibial pulses palpable bilateral.  Capillary fill time is immediate to all digits.  Neurological: Epicritic and protective threshold intact bilateral.   Musculoskeletal: Tenderness to palpation to the plantar aspect of the left heel along the plantar fascia. All other joints range of motion within normal limits bilateral. Strength 5/5 in all groups bilateral.   Radiographic exam LT foot 07/18/2022: Normal osseous mineralization. Joint spaces preserved. No fracture/dislocation/boney destruction. No other soft tissue abnormalities or radiopaque foreign bodies.  Orthopedic hardware noted to the left ankle  Assessment: 1. Plantar fasciitis left foot 2.  Symptomatic skin lesion lateral aspect of left ankle correlating directly to underlying orthopedic hardware from prior ankle surger x 15 years ago  Plan of Care:  -Patient evaluated.   -Injection of 0.5cc Celestone soluspan injected into the left plantar fascia.  -Prescription for meloxicam 15 mg daily.  Patient states that his renal function is WNL and it was only related to the medication Trulicity that he was taking -Continue OTC prefabricated insoles dispensed today to wear in his work boots.   -  Return to clinic 4 weeks  *Special educational needs teacher for [facility]   Felecia Shelling, [facility]  Dr. Felecia Shelling, DPM    [address] [place], [place] [address]                Office [phone]  Fax [phone]

## 2022-08-12 NOTE — Telephone Encounter (Signed)
Pharmacy Patient Advocate Encounter  Received notification that the request for prior authorization for Dexcom G6 transmitter has been denied.   Per denial letter: They only allow 1 transmitter per 90 days. It looks like he last got one on 07/11/22.

## 2022-08-12 NOTE — Telephone Encounter (Signed)
Pharmacy Patient Advocate Encounter  Prior Authorization for Dexcom G6 sensor has been approved    Effective dates: 08/09/22 through 02/05/23

## 2022-08-12 NOTE — Telephone Encounter (Signed)
Pharmacy Patient Advocate Encounter  Prior Authorization for Terry [name] has been approved  Effective dates: 08/09/22 through 08/09/23

## 2022-08-12 NOTE — Telephone Encounter (Signed)
Patient called & made aware.  

## 2022-08-12 NOTE — Telephone Encounter (Signed)
Patient was called and made aware. 

## 2022-08-12 NOTE — Telephone Encounter (Signed)
ERROR

## 2022-08-12 NOTE — Progress Notes (Signed)
Make sure the most recent pain management office visit as well as a urine drug screen is included with this then give this to Autumn so that she can file the request

## 2022-08-15 ENCOUNTER — Encounter: Payer: Self-pay | Admitting: Nurse Practitioner

## 2022-08-18 ENCOUNTER — Other Ambulatory Visit: Payer: Self-pay | Admitting: Family Medicine

## 2022-08-19 ENCOUNTER — Encounter: Payer: Self-pay | Admitting: Podiatry

## 2022-08-19 ENCOUNTER — Ambulatory Visit (INDEPENDENT_AMBULATORY_CARE_PROVIDER_SITE_OTHER): Payer: BC Managed Care – PPO | Admitting: Family Medicine

## 2022-08-19 ENCOUNTER — Other Ambulatory Visit: Payer: Self-pay | Admitting: Podiatry

## 2022-08-19 VITALS — BP 128/86 | Ht 70.0 in | Wt 212.6 lb

## 2022-08-19 DIAGNOSIS — E785 Hyperlipidemia, unspecified: Secondary | ICD-10-CM

## 2022-08-19 DIAGNOSIS — E1165 Type 2 diabetes mellitus with hyperglycemia: Secondary | ICD-10-CM | POA: Diagnosis not present

## 2022-08-19 DIAGNOSIS — R7989 Other specified abnormal findings of blood chemistry: Secondary | ICD-10-CM | POA: Diagnosis not present

## 2022-08-19 DIAGNOSIS — Z125 Encounter for screening for malignant neoplasm of prostate: Secondary | ICD-10-CM

## 2022-08-19 DIAGNOSIS — Z794 Long term (current) use of insulin: Secondary | ICD-10-CM

## 2022-08-19 DIAGNOSIS — Z79891 Long term (current) use of opiate analgesic: Secondary | ICD-10-CM

## 2022-08-19 DIAGNOSIS — E1169 Type 2 diabetes mellitus with other specified complication: Secondary | ICD-10-CM

## 2022-08-19 MED ORDER — OXYCODONE-ACETAMINOPHEN 10-325 MG PO TABS
ORAL_TABLET | ORAL | 0 refills | Status: DC
Start: 2022-08-19 — End: 2022-12-20

## 2022-08-19 MED ORDER — OXYCODONE-ACETAMINOPHEN 10-325 MG PO TABS
ORAL_TABLET | ORAL | 0 refills | Status: DC
Start: 2022-08-19 — End: 2022-12-05

## 2022-08-19 MED ORDER — MELOXICAM 15 MG PO TABS: 15.0000 mg | ORAL_TABLET | Freq: Every day | ORAL | 1 refills | Status: DC

## 2022-08-19 NOTE — Progress Notes (Signed)
Subjective:    Patient ID: Terry [name], male    DOB: [birth year], 61 y.o.   MRN: [id number]  HPI  This patient was seen today for chronic pain Patient had fracture of his foot back in 2007 had plates and screws placed at that time.  He also has severe osteoarthritis of his hands knees.  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  Has chronic pain in these areas that is not controlled with Tylenol or NSAIDs  The medication list was reviewed and updated.   Location of Pain for which the patient has been treated with regarding narcotics: hips, knees ankles and hands-patient has previous fracture of his ankle back in 2017.  He has chronic pain in that area.  He also has chronic pain in his hands as well as hips and knees.  Patient has osteoarthritis in those areas.  Patient has tried NSAIDs in the past which did not adequately help his pain.  Tylenol did not adequately help his pain.  He has been on pain medication ever since approximately 2013.  Was initially on hydrocodone gradually increased to his current medication.  Onset of this pain: years   -Compliance with medication: yes  - Number patient states they take daily: 4-5 a day  -Reason for ongoing use of opioids pain control  What other measures have been tried outside of opioids Tylenol, NSAIDs, warm compresses  In the ongoing specialists regarding this condition he has seen podiatry and orthopedics for his ankle  -when was the last dose patient took? today  The patient was advised the importance of maintaining medication and not using illegal substances with these.  Here for refills and follow up  The patient was educated that we can provide 3 monthly scripts for their medication, it is their responsibility to follow the instructions.  Side effects or complications from medications: yes  Patient is aware that pain medications are meant to minimize the severity of the pain to allow their pain levels to  improve to allow for better function. They are aware of that pain medications cannot totally remove their pain.  Due for UDT ( at least once per year) (pain management contract is also completed at the time of the UDT): 10/22/21        Review of Systems     Objective:   Physical Exam General-in no acute distress Eyes-no discharge Lungs-respiratory rate normal, CTA CV-no murmurs,RRR Extremities skin warm dry no edema Neuro grossly normal Behavior normal, alert Patient with significant osteoarthritis noted in his knees and hands       Assessment & Plan:  1. Encounter for long-term opiate analgesic use Pain management contract is in place Urine drug screen today Patient is compliant with his medicines  Patient has been on opioid treatment for well over 10 years.  He is compliant.  He does yearly drug screens.  He has a pain Mining engineer.  His prescribing meets the state guidelines.  He is being treated for significant pain and discomfort with osteoarthritis in his hands and knees as well as left ankle pain from a previous fracture in 2007 with plates and screws.  In the past he was treated with Tylenol and NSAIDs which did not adequately help control his pain.  Pain relief management is necessary in order to allow for him to continue working. The medication does allow him adequate relief that allows him to continue to have gainful employment.  The patient was seen  in followup for chronic pain. A review over at their current pain status was discussed. Drug registry was checked. Prescriptions were given.  Regular follow-up recommended. Discussion was held regarding the importance of compliance with medication as well as pain medication contract.  Patient was informed that medication may cause drowsiness and should not be combined  with other medications/alcohol or street drugs. If the patient feels medication is causing altered alertness then do not drive or operate  dangerous equipment.  Should be noted that the patient appears to be meeting appropriate use of opioids and response.  Evidenced by improved function and decent pain control without significant side effects and no evidence of overt aberrancy issues.  Upon discussion with the patient today they understand that opioid therapy is optional and they feel that the pain has been refractory to reasonable conservative measures and is significant and affecting quality of life enough to warrant ongoing therapy and wishes to continue opioids.  Refills were provided.  Drug registry checked, pain contract signed, patient keeps his meds safe, refills granted, PDMP checked, urine drug screen today - ToxAS[employer] Select 13 (MW), Urine  2. Type 2 diabetes mellitus with hyperglycemia, with long-term current use of insulin (HCC) Followed by endocrinology check lab work - Basic metabolic panel  3. Elevated serum creatinine Check lab work - Adult nurse  4. Hyperlipidemia associated with type 2 diabetes mellitus (HCC) Continue medication check lab work - Lipid panel  5. Screening PSA (prostate specific antigen) Screening PSA - PSA  Follow-up 3 months

## 2022-08-20 LAB — TOXASSURE SELECT 13 (MW), URINE

## 2022-08-20 LAB — MED LIST OPTION NOT SELECTED

## 2022-08-22 ENCOUNTER — Telehealth: Payer: Self-pay | Admitting: Family Medicine

## 2022-08-22 LAB — SPECIMEN STATUS REPORT

## 2022-08-22 NOTE — Telephone Encounter (Signed)
I received a copy from his insurance company and that his pain medicine was denied his most recent pain management visit just a few days ago should have adequate documentation in order to cover his pain medicine if you need additional documentation please let me know Please see the denial form thank you

## 2022-08-23 NOTE — Telephone Encounter (Signed)
Thank you :)

## 2022-08-23 NOTE — Telephone Encounter (Signed)
Office note faxed to insurance for appeal

## 2022-08-25 ENCOUNTER — Telehealth: Payer: Self-pay

## 2022-08-25 ENCOUNTER — Other Ambulatory Visit: Payer: Self-pay | Admitting: "Endocrinology

## 2022-08-25 MED ORDER — FENOFIBRATE 160 MG PO TABS: 160.0000 mg | ORAL_TABLET | Freq: Every day | ORAL | 1 refills | Status: DC

## 2022-08-25 NOTE — Telephone Encounter (Signed)
Pt is calling saying that Dr Lorin Picket is the one that wrote this medication and needs a refill but the refill has already be sent to Ronny Bacon NP but patient is asking me to send through the medication request to Dr Lorin Picket   fenofibrate 160 MG tablet [employer] [phone]

## 2022-09-16 ENCOUNTER — Ambulatory Visit (INDEPENDENT_AMBULATORY_CARE_PROVIDER_SITE_OTHER): Payer: BC Managed Care – PPO | Admitting: Podiatry

## 2022-09-16 ENCOUNTER — Encounter: Payer: Self-pay | Admitting: Podiatry

## 2022-09-16 VITALS — BP 159/71 | HR 73

## 2022-09-16 DIAGNOSIS — M722 Plantar fascial fibromatosis: Secondary | ICD-10-CM

## 2022-09-16 MED ORDER — BETAMETHASONE SOD PHOS & ACET 6 (3-3) MG/ML IJ SUSP
3.0000 mg | Freq: Once | INTRAMUSCULAR | Status: AC
Start: 2022-09-16 — End: 2022-09-16
  Administered 2022-09-16: 3 mg via INTRA_ARTICULAR

## 2022-09-16 MED ORDER — MELOXICAM 15 MG PO TABS: 15.0000 mg | ORAL_TABLET | Freq: Every day | ORAL | 1 refills | Status: DC

## 2022-09-16 NOTE — Progress Notes (Addendum)
Chief Complaint  Patient presents with   Plantar Fasciitis    "It's better but no where near like it was."    Subjective: 62 y.o. male presenting today for follow-up valuation of plantar fasciitis to the left heel.  Overall significant improvement.  He says he is drastically better and very satisfied with his improvement so far over the last few months.  He says the meloxicam helped significantly as well as the injections.  No new complaints   Past Medical History:  Diagnosis Date   Diabetes mellitus without complication (HCC) 2004   GERD (gastroesophageal reflux disease)    Hyperlipidemia    Hypertension    Osteoarthritis    Past Surgical History:  Procedure Laterality Date   ANKLE SURGERY Left 03/28/2005   2 plates/screws   COLONOSCOPY N/A 08/04/2017   Procedure: COLONOSCOPY;  Surgeon: Malissa Hippo, MD;  [facility];  Service: Endoscopy;  Laterality: N/A;   ESOPHAGEAL DILATION N/A 08/04/2017   Procedure: ESOPHAGEAL DILATION;  Surgeon: Malissa Hippo, MD;  [facility];  Service: Endoscopy;  Laterality: N/A;   ESOPHAGOGASTRODUODENOSCOPY N/A 08/04/2017   Procedure: ESOPHAGOGASTRODUODENOSCOPY (EGD);  Surgeon: Malissa Hippo, MD;  [facility];  Service: Endoscopy;  Laterality: N/A;   POLYPECTOMY  08/04/2017   Procedure: POLYPECTOMY;  Surgeon: Malissa Hippo, MD;  [facility];  Service: Endoscopy;;   ROOT CANAL     Allergies  Allergen Reactions   Invokana [Canagliflozin] Other (See Comments)    laryngitis   Trulicity [Dulaglutide] Swelling    Patient also did not tolerate because of blistering in the mouth would avoid all GLP-1's patient was hospitalized with diabetic ketoacidosis August 2023   Altace [Ramipril] Cough   Metformin And Related Diarrhea     Objective: Physical Exam General: The patient is alert and oriented x3 in no acute distress.  Dermatology: Skin is warm, dry and supple bilateral lower  extremities. Negative for open lesions or macerations bilateral.  There is a focal skin lesion also noted to the lateral aspect of the left ankle.  It is currently not open but there is a well adhered eschar/scab with associated tenderness.  This directly correlates on radiographic exam with an underlying screw from prior ORIF of the left ankle  Vascular: Dorsalis Pedis and Posterior Tibial pulses palpable bilateral.  Capillary fill time is immediate to all digits.  Neurological: Grossly in tact via light touch  Musculoskeletal: Overall significant improvement there continues to be some mild tenderness to palpation to the plantar aspect of the left heel along the plantar fascia. All other joints range of motion within normal limits bilateral. Strength 5/5 in all groups bilateral.   Radiographic exam LT foot 07/18/2022: Normal osseous mineralization. Joint spaces preserved. No fracture/dislocation/boney destruction. No other soft tissue abnormalities or radiopaque foreign bodies.  Orthopedic hardware noted to the left ankle  Assessment: 1. Plantar fasciitis left foot 2.  Symptomatic skin lesion lateral aspect of left ankle correlating directly to underlying orthopedic hardware from prior ankle surger x 15 years ago  Plan of Care:  -Patient evaluated.   -Injection of 0.5cc Celestone soluspan injected into the left plantar fascia.  - Continue meloxicam 15 mg daily as needed -Continue OTC prefabricated PowerStep insoles.  He says that helped significantly -Return to clinic 3 months   *Special educational needs teacher for [employer]   Felecia Shelling, [place] [facility]  Dr. Felecia Shelling, DPM  [address] [place], [place] [address]                Office [phone]  Fax [phone]

## 2022-09-28 ENCOUNTER — Telehealth: Payer: Self-pay

## 2022-09-28 NOTE — Progress Notes (Signed)
   Care Guide Note  09/28/2022 Name: Terry [name] MRN: [id number] DOB: [birth year]  Referred by: Babs Sciara, MD Reason for referral : Care Coordination (Outreach to schedule with Pharm d  New MM DM )   Terry [name] is a 62 y.o. year old male who is a primary care patient of Luking, Jonna Coup, MD. Sander Radon was referred to the pharmacist for assistance related to DM.    An unsuccessful telephone outreach was attempted today to contact the patient who was referred to the pharmacy team for assistance with medication management. Additional attempts will be made to contact the patient.   Penne Lash, RMA Care Guide [facility]  [place], [place] [address] Direct Dial: [phone] [email or web address]

## 2022-09-30 ENCOUNTER — Other Ambulatory Visit: Payer: Self-pay | Admitting: Family Medicine

## 2022-10-25 NOTE — Progress Notes (Signed)
   Care Guide Note  10/25/2022 Name: [name] MRN: [id number] DOB: 1960-09-24  Referred by: Babs Sciara, MD Reason for referral : Care Coordination (Outreach to schedule with Pharm d  New MM DM )   Terry [name] is a 62 y.o. year old male who is a primary care patient of Luking, Jonna Coup, MD. Terry [name] was referred to the pharmacist for assistance related to DM.    A second unsuccessful telephone outreach was attempted today to contact the patient who was referred to the pharmacy team for assistance with medication management. Additional attempts will be made to contact the patient.  Penne Lash, RMA Care Guide [facility]  [place], [place] [address] Direct Dial: [phone] [email or web address]

## 2022-10-27 ENCOUNTER — Other Ambulatory Visit: Payer: Self-pay | Admitting: Family Medicine

## 2022-11-04 ENCOUNTER — Ambulatory Visit (INDEPENDENT_AMBULATORY_CARE_PROVIDER_SITE_OTHER): Payer: BC Managed Care – PPO | Admitting: Nurse Practitioner

## 2022-11-04 ENCOUNTER — Encounter: Payer: Self-pay | Admitting: Nurse Practitioner

## 2022-11-04 VITALS — BP 138/80 | HR 88 | Ht 70.0 in | Wt 218.8 lb

## 2022-11-04 DIAGNOSIS — I10 Essential (primary) hypertension: Secondary | ICD-10-CM

## 2022-11-04 DIAGNOSIS — N1831 Chronic kidney disease, stage 3a: Secondary | ICD-10-CM | POA: Diagnosis not present

## 2022-11-04 DIAGNOSIS — E1122 Type 2 diabetes mellitus with diabetic chronic kidney disease: Secondary | ICD-10-CM

## 2022-11-04 DIAGNOSIS — Z7984 Long term (current) use of oral hypoglycemic drugs: Secondary | ICD-10-CM

## 2022-11-04 DIAGNOSIS — Z794 Long term (current) use of insulin: Secondary | ICD-10-CM

## 2022-11-04 DIAGNOSIS — E782 Mixed hyperlipidemia: Secondary | ICD-10-CM

## 2022-11-04 LAB — POCT GLYCOSYLATED HEMOGLOBIN (HGB A1C): Hemoglobin A1C: 9.5 % — AB (ref 4.0–5.6)

## 2022-11-04 MED ORDER — CONTOUR NEXT TEST VI STRP: ORAL_STRIP | 12 refills | Status: DC

## 2022-11-04 MED ORDER — INSULIN LISPRO 100 UNIT/ML IJ SOLN
35.0000 [IU] | Freq: Three times a day (TID) | INTRAMUSCULAR | 3 refills | Status: DC
Start: 2022-11-04 — End: 2023-03-03

## 2022-11-04 MED ORDER — INSULIN LISPRO 100 UNIT/ML IJ SOLN
30.0000 [IU] | Freq: Three times a day (TID) | INTRAMUSCULAR | 3 refills | Status: DC
Start: 2022-11-04 — End: 2022-11-04

## 2022-11-04 MED ORDER — TOUJEO MAX SOLOSTAR 300 UNIT/ML ~~LOC~~ SOPN
100.0000 [IU] | PEN_INJECTOR | Freq: Every evening | SUBCUTANEOUS | 3 refills | Status: DC
Start: 2022-11-04 — End: 2023-03-03

## 2022-11-04 NOTE — Progress Notes (Signed)
Endocrinology Follow Up Visit      11/04/2022, 9:11 AM   Subjective:    Patient ID: Terry [name], male    DOB: 11-Feb-1961.  Terry [name] is being seen in follow up after being seen in consultation for management of currently uncontrolled symptomatic diabetes requested by  Babs Sciara, MD.   Past Medical History:  Diagnosis Date   Diabetes mellitus without complication (HCC) 2004   GERD (gastroesophageal reflux disease)    Hyperlipidemia    Hypertension    Osteoarthritis     Past Surgical History:  Procedure Laterality Date   ANKLE SURGERY Left 03/28/2005   2 plates/screws   COLONOSCOPY N/A 08/04/2017   Procedure: COLONOSCOPY;  Surgeon: Malissa Hippo, MD;  [facility];  Service: Endoscopy;  Laterality: N/A;   ESOPHAGEAL DILATION N/A 08/04/2017   Procedure: ESOPHAGEAL DILATION;  Surgeon: Malissa Hippo, MD;  [facility];  Service: Endoscopy;  Laterality: N/A;   ESOPHAGOGASTRODUODENOSCOPY N/A 08/04/2017   Procedure: ESOPHAGOGASTRODUODENOSCOPY (EGD);  Surgeon: Malissa Hippo, MD;  [facility];  Service: Endoscopy;  Laterality: N/A;   POLYPECTOMY  08/04/2017   Procedure: POLYPECTOMY;  Surgeon: Malissa Hippo, MD;  [facility];  Service: Endoscopy;;   ROOT CANAL      Social History   Socioeconomic History   Marital status: Married    Spouse name: Not on file   Number of children: Not on file   Years of education: Not on file   Highest education level: Not on file  Occupational History   Not on file  Tobacco Use   Smoking status: Former    Current packs/day: 0.00    Types: Cigarettes    Quit date: 06/15/1992    Years since quitting: 30.4   Smokeless tobacco: Never  Vaping Use   Vaping status: Never Used  Substance and Sexual Activity   Alcohol use: Never   Drug use: Not Currently    Types: Marijuana    Comment: last used 1970    Sexual activity: Yes  Other Topics Concern   Not on file  Social History Narrative   Not on file   Social Determinants of Health   Financial Resource Strain: Not on file  Food Insecurity: Not on file  Transportation Needs: Not on file  Physical Activity: Not on file  Stress: Not on file  Social Connections: Not on file    Family History  Problem Relation Age of Onset   Hypertension Mother    Diabetes Mother    Heart disease Mother    Hyperlipidemia Mother    Cancer Father        lung    Outpatient Encounter Medications as of 11/04/2022  Medication Sig   amLODipine (NORVASC) 10 MG tablet Take 1 tablet (10 mg total) by mouth daily.   aspirin EC 81 MG tablet Take 1 tablet (81 mg total) by mouth daily with breakfast.   Continuous Glucose Sensor (DEXCOM G6 SENSOR) MISC Change sensor every 10 days   Continuous Glucose Transmitter (DEXCOM G6 TRANSMITTER) MISC Change transmitter every 90 days   ezetimibe (ZETIA) 10 MG tablet Take 1 tablet (10 mg  total) by mouth daily.   fenofibrate 160 MG tablet Take 1 tablet (160 mg total) by mouth daily.   glipiZIDE (GLUCOTROL XL) 5 MG 24 hr tablet Take 1 tablet (5 mg total) by mouth daily with breakfast.   glucose blood (CONTOUR NEXT TEST) test strip Use as instructed to monitor glucose 4 times daily   indapamide (LOZOL) 2.5 MG tablet TAKE (1) TABLET BY MOUTH ONCE DAILY.   Insulin Pen Needle (PEN NEEDLES) 31G X 6 MM MISC Use to inject insulin 4 times daily   meloxicam (MOBIC) 15 MG tablet Take 1 tablet (15 mg total) by mouth daily.   metoprolol succinate (TOPROL-XL) 100 MG 24 hr tablet TAKE (1) TABLET BY MOUTH ONCE DAILY.   Multiple Vitamin (MULTIVITAMIN) tablet Take 1 tablet by mouth daily.   oxyCODONE-acetaminophen (PERCOCET) 10-325 MG tablet 1 taken 4 times daily as needed for pain   oxyCODONE-acetaminophen (PERCOCET) 10-325 MG tablet 1  taken 4 times daily as needed for pain   oxyCODONE-acetaminophen (PERCOCET) 10-325 MG tablet 1 taken 4  times daily as needed for pain   pantoprazole (PROTONIX) 40 MG tablet Take 1 tablet (40 mg total) by mouth 2 (two) times daily.   rosuvastatin (CRESTOR) 40 MG tablet TAKE (1) TABLET BY MOUTH ONCE DAILY.   sildenafil (REVATIO) 20 MG tablet Take 1-5 tablets before sexual activity as directed. No greater that 5 tablets in a day.   triamcinolone cream (KENALOG) 0.1 % APPLY TO AFFECTED AREA SPARINGLY THREE TIMES DAILY.   valsartan (DIOVAN) 160 MG tablet TAKE (1) TABLET BY MOUTH ONCE DAILY.   [DISCONTINUED] insulin glargine, 2 Unit Dial, (TOUJEO MAX SOLOSTAR) 300 UNIT/ML Solostar Pen Inject 90 Units into the skin at bedtime.   [DISCONTINUED] insulin lispro (HUMALOG) 100 UNIT/ML injection Inject 0.25-0.31 mLs (25-31 Units total) into the skin 3 (three) times daily before meals.   [DISCONTINUED] methylPREDNISolone (MEDROL DOSEPAK) 4 MG TBPK tablet 6 day dose pack - take as directed   insulin glargine, 2 Unit Dial, (TOUJEO MAX SOLOSTAR) 300 UNIT/ML Solostar Pen Inject 100 Units into the skin at bedtime.   insulin lispro (HUMALOG) 100 UNIT/ML injection Inject 0.35-0.41 mLs (35-41 Units total) into the skin 3 (three) times daily with meals.   [DISCONTINUED] insulin lispro (HUMALOG) 100 UNIT/ML injection Inject 0.3-0.36 mLs (30-36 Units total) into the skin 3 (three) times daily before meals.   No facility-administered encounter medications on file as of 11/04/2022.    ALLERGIES: Allergies  Allergen Reactions   Invokana [Canagliflozin] Other (See Comments)    laryngitis   Trulicity [Dulaglutide] Swelling    Patient also did not tolerate because of blistering in the mouth would avoid all GLP-1's patient was hospitalized with diabetic ketoacidosis August 2023   Altace [Ramipril] Cough   Metformin And Related Diarrhea    VACCINATION STATUS: Immunization History  Administered Date(s) Administered   Influenza,inj,Quad PF,6+ Mos 01/30/2014, 01/29/2016, 01/30/2017, 01/26/2018, 01/23/2019, 01/17/2020,  01/01/2021   Influenza-Unspecified 01/26/2012, 02/21/2015, 01/10/2022   Pneumococcal Polysaccharide-23 01/30/2014   Td 12/19/2014    Diabetes He presents for his follow-up diabetic visit. He has type 2 diabetes mellitus. Onset time: Diagnosed at approximate age of 9. His disease course has been improving. There are no hypoglycemic associated symptoms. Pertinent negatives for hypoglycemia include no nervousness/anxiousness. Associated symptoms include fatigue. Pertinent negatives for diabetes include no blurred vision, no polydipsia, no polyphagia, no polyuria and no weight loss. There are no hypoglycemic complications. Nocturnal hypoglycemia: mild. Symptoms are stable. Diabetic complications include nephropathy. Risk factors for  coronary artery disease include diabetes mellitus, dyslipidemia, hypertension, male sex, obesity, stress and sedentary lifestyle. Current diabetic treatment includes intensive insulin program and oral agent (monotherapy). He is compliant with treatment most of the time. His weight is increasing steadily. He is following a generally healthy diet. When asked about meal planning, he reported none. He has not had a previous visit with a dietitian. He participates in exercise intermittently. His home blood glucose trend is fluctuating minimally. His overall blood glucose range is >200 mg/dl. (He presents today with his logs and CGM data (last data from 7/23 has trouble keeping sensors on due to sweating) showing slow improvement but still above target glycemic profile.  His POCT A1c today is 9.5%, improving from last visit of 10.2%.  He does have significant insulin resistance and intermittent steroid use (injections for foot) which complicate his management.  He notes he eats a lot of salads, some processed meat on sandwiches (rarely).) An ACE inhibitor/angiotensin II receptor blocker is being taken. He does not see a podiatrist.Eye exam is not current.  Hypertension This is a chronic  problem. The current episode started more than 1 year ago. The problem has been resolved since onset. The problem is controlled. Pertinent negatives include no blurred vision. There are no associated agents to hypertension. Risk factors for coronary artery disease include diabetes mellitus, dyslipidemia, male gender, obesity and stress. Past treatments include angiotensin blockers and calcium channel blockers. The current treatment provides mild improvement. Compliance problems include diet and exercise.  Hypertensive end-organ damage includes kidney disease. Identifiable causes of hypertension include chronic renal disease.  Hyperlipidemia This is a chronic problem. The current episode started more than 1 year ago. The problem is uncontrolled. Recent lipid tests were reviewed and are variable. Exacerbating diseases include chronic renal disease and diabetes. Factors aggravating his hyperlipidemia include fatty foods. Current antihyperlipidemic treatment includes statins and fibric acid derivatives. The current treatment provides moderate improvement of lipids. Compliance problems include adherence to diet, adherence to exercise and psychosocial issues.  Risk factors for coronary artery disease include diabetes mellitus, dyslipidemia, hypertension, male sex and obesity.    Review of systems  Constitutional: + steadily increasing body weight,  current Body mass index is 31.39 kg/m. , no fatigue, no subjective hyperthermia, no subjective hypothermia Eyes: no blurry vision, no xerophthalmia ENT: no sore throat, no nodules palpated in throat, no dysphagia/odynophagia, no hoarseness Cardiovascular: no chest pain, no shortness of breath, no palpitations, no leg swelling Respiratory: no cough, no shortness of breath Gastrointestinal: no nausea/vomiting/diarrhea Musculoskeletal: no muscle/joint aches Skin: no rashes, no hyperemia Neurological: no tremors, no numbness, no tingling, no dizziness Psychiatric:  no depression, no anxiety   Objective:     BP 138/80 (BP Location: Right Arm, Patient Position: Sitting, Cuff Size: Large)   Pulse 88   Ht 5\' 10"  (1.778 m)   Wt 218 lb 12.8 oz (99.2 kg)   BMI 31.39 kg/m   Wt Readings from Last 3 Encounters:  11/04/22 218 lb 12.8 oz (99.2 kg)  08/19/22 212 lb 9.6 oz (96.4 kg)  07/29/22 205 lb 3.2 oz (93.1 kg)    BP Readings from Last 3 Encounters:  11/04/22 138/80  09/16/22 (!) 159/71  08/19/22 128/86      Physical Exam- Limited  Constitutional:  Body mass index is 31.39 kg/m. , not in acute distress, normal state of mind Eyes:  EOMI, no exophthalmos Musculoskeletal: no gross deformities, strength intact in all four extremities, no gross restriction of joint movements Skin:  no rashes, no hyperemia Neurological: no tremor with outstretched hands    Diabetic Foot Exam - Simple   Simple Foot Form Diabetic Foot exam was performed with the following findings: Yes 11/04/2022  8:56 AM  Visual Inspection See comments: Yes Sensation Testing Intact to touch and monofilament testing bilaterally: Yes Pulse Check Posterior Tibialis and Dorsalis pulse intact bilaterally: Yes Comments Left foot lateral malleolus has reddened area where his previous hardware is working its way out (sees podiatry for this- being evaluated for possible corrective surgery).     CMP ( most recent) CMP     Component Value Date/Time   NA 137 03/17/2022 0917   K 4.6 03/17/2022 0917   CL 98 03/17/2022 0917   CO2 22 03/17/2022 0917   GLUCOSE 391 (H) 03/17/2022 0917   GLUCOSE 264 (H) 11/25/2021 0422   BUN 21 03/17/2022 0917   CREATININE 1.43 (H) 03/17/2022 0917   CREATININE 0.91 02/25/2014 0727   CALCIUM 10.0 03/17/2022 0917   PROT 7.3 03/17/2022 0917   ALBUMIN 4.6 03/17/2022 0917   AST 20 03/17/2022 0917   ALT 24 03/17/2022 0917   ALKPHOS 56 03/17/2022 0917   BILITOT 0.3 03/17/2022 0917   GFRNONAA 52 (L) 11/25/2021 0422   GFRAA 66 01/17/2020 0928      Diabetic Labs (most recent): Lab Results  Component Value Date   HGBA1C 9.5 (A) 11/04/2022   HGBA1C 10.2 (A) 07/29/2022   HGBA1C 9.8 (A) 04/29/2022   MICROALBUR 150 07/09/2021   MICROALBUR 150 03/09/2020   MICROALBUR 3.36 (H) 05/11/2013     Lipid Panel ( most recent) Lipid Panel     Component Value Date/Time   CHOL 187 03/17/2022 0917   TRIG 426 (H) 03/17/2022 0917   HDL 28 (L) 03/17/2022 0917   CHOLHDL 6.7 (H) 03/17/2022 0917   CHOLHDL 5.6 08/06/2021 0806   VLDL 52 (H) 08/06/2021 0806   LDLCALC 89 03/17/2022 0917   LABVLDL 70 (H) 03/17/2022 0917      Lab Results  Component Value Date   TSH 0.713 04/30/2021   TSH 1.280 06/29/2020   TSH 1.470 11/11/2019   TSH 2.144 04/04/2019   TSH 0.986 07/01/2015   FREET4 1.39 06/29/2020   FREET4 1.44 11/11/2019           Assessment & Plan:   1) DM type 2 without retinopathy (HCC)  - Terry [name] has currently uncontrolled symptomatic type 2 DM since  62 years of age.  He presents today with his logs and CGM data (last data from 7/23 has trouble keeping sensors on due to sweating) showing slow improvement but still above target glycemic profile.  His POCT A1c today is 9.5%, improving from last visit of 10.2%.  He does have significant insulin resistance and intermittent steroid use (injections for foot) which complicate his management.  He notes he eats a lot of salads, some processed meat on sandwiches (rarely).   Recent labs reviewed.   - I had a long discussion with him about the progressive nature of diabetes and the pathology behind its complications. -his diabetes is complicated by CKD stage 3a and he remains at a high risk for more acute and chronic complications which include CAD, CVA, CKD, retinopathy, and neuropathy. These are all discussed in detail with him.  - Nutritional counseling repeated at each appointment due to patients tendency to fall back in to old habits.  - The patient admits there is a  room for improvement in their diet and  drink choices. -  Suggestion is made for the patient to avoid simple carbohydrates from their diet including Cakes, Sweet Desserts / Pastries, Ice Cream, Soda (diet and regular), Sweet Tea, Candies, Chips, Cookies, Sweet Pastries, Store Bought Juices, Alcohol in Excess of 1-2 drinks a day, Artificial Sweeteners, Coffee Creamer, and "Sugar-free" Products. This will help patient to have stable blood glucose profile and potentially avoid unintended weight gain.   - I encouraged the patient to switch to unprocessed or minimally processed complex starch and increased protein intake (animal or plant source), fruits, and vegetables.   - Patient is advised to stick to a routine mealtimes to eat 3 meals a day and avoid unnecessary snacks (to snack only to correct hypoglycemia).  - I have approached him with the following individualized plan to manage  his diabetes and patient agrees:   -We discussed necessary changes in diet to prevent dramatic fluctuations in glucose and for safety purposes he needs to be eating to inject his insulin.  He did seem to have better response to basal/bolus combo rather than U500.  -He is advised to continue Toujeo 100 units SQ nightly and adjust his Humalog to 35-41 units TID with meals if glucose is above 90 and he is eating (Specific instructions on how to titrate insulin dosage based on glucose readings given to patient in writing).  He can also continue Glipizide 5 mg XL daily with breakfast.  His diabetes is complicated due to steroid injections on top of his significant insulin resistance.  We need to be more aggressive to get his A1c down so he can qualify for corrective surgery for his left ankle.  -He is encouraged to continue monitoring glucose 4 times daily (using his CGM), before meals and before bed, and to call the clinic if he has readings less than 70 or greater than 300 for 3 tests in a row.  - he is warned not to take  insulin without proper monitoring per orders.  -He is NOT candidate for any GLP1 due to severe allergic reaction to Trulicity.  He did not tolerate Metformin in the past (even in the ER form).  He has been on SGLT2i and did not tolerate them due to chronic laryngitis.  We did also discuss insulin pumps and encouraged him to reach out to his insurance for coverage/price options for Omnipod DASH/5 and or Tandem T-Slim.  Says all of these options are too expensive for his budget right now.  - Specific targets for  A1c;  LDL, HDL,  and Triglycerides were discussed with the patient.  2) Blood Pressure /Hypertension: His blood pressure is controlled to target.  He is advised to continue Diovan 160 mg, Amlodipine 10 mg po daily, Indapamide 2.5 mg po daily, and Metoprolol 100 mg po daily.      3) Lipids/Hyperlipidemia:    His most recent lipid panel from 03/17/22 shows controlled LDL of 89 and significantly elevated triglycerides of 426.  He is advised to continue Crestor 40 mg po daily at bedtime and Fenofibrate 160 mg po daily.  Side effects and precautions discussed with him.  We discussed the need to avoid fried foods and butter to reduce his risk of cardiovascular events.  4)  Weight/Diet:  His Body mass index is 31.39 kg/m.  -   he is a candidate for weight loss. I discussed with him the fact that loss of 5 - 10% of his  current body weight will have the most impact on his diabetes  management.  Exercise, and detailed carbohydrates information provided  -  detailed on discharge instructions.  5) Chronic Care/Health Maintenance: -he on ACEI/ARB and Statin medications and  is encouraged to initiate and continue to follow up with Ophthalmology, Dentist, Podiatrist at least yearly or according to recommendations, and advised to stay away from smoking. I have recommended yearly flu vaccine and pneumonia vaccine at least every 5 years; moderate intensity exercise for up to 150 minutes weekly; and  sleep  for at least 7 hours a day.  - he is advised to maintain close follow up with Babs Sciara, MD for primary care needs, as well as his other providers for optimal and coordinated care.     I spent  43  minutes in the care of the patient today including review of labs from CMP, Lipids, Thyroid Function, Hematology (current and previous including abstractions from other facilities); face-to-face time discussing  his blood glucose readings/logs, discussing hypoglycemia and hyperglycemia episodes and symptoms, medications doses, his options of short and long term treatment based on the latest standards of care / guidelines;  discussion about incorporating lifestyle medicine;  and documenting the encounter. Risk reduction counseling performed per USPSTF guidelines to reduce obesity and cardiovascular risk factors.     Please refer to Patient Instructions for Blood Glucose Monitoring and Insulin/Medications Dosing Guide"  in media tab for additional information. Please  also refer to " Patient Self Inventory" in the Media  tab for reviewed elements of pertinent patient history.  Terry [name] participated in the discussions, expressed understanding, and voiced agreement with the above plans.  All questions were answered to his satisfaction. he is encouraged to contact clinic should he have any questions or concerns prior to his return visit.   Follow up plan: - Return in about 3 months (around 02/04/2023) for Diabetes F/U with A1c in office, No previsit labs, Bring meter and logs.   Ronny Bacon, [facility] [address] [place], [place] [address] Phone: [phone] Fax: [phone]  11/04/2022, 9:11 AM

## 2022-11-08 ENCOUNTER — Encounter: Payer: Self-pay | Admitting: *Deleted

## 2022-11-15 ENCOUNTER — Other Ambulatory Visit: Payer: Self-pay | Admitting: Student

## 2022-11-21 DIAGNOSIS — Z125 Encounter for screening for malignant neoplasm of prostate: Secondary | ICD-10-CM | POA: Diagnosis not present

## 2022-11-21 DIAGNOSIS — E1165 Type 2 diabetes mellitus with hyperglycemia: Secondary | ICD-10-CM | POA: Diagnosis not present

## 2022-11-21 DIAGNOSIS — E1169 Type 2 diabetes mellitus with other specified complication: Secondary | ICD-10-CM | POA: Diagnosis not present

## 2022-11-21 DIAGNOSIS — Z794 Long term (current) use of insulin: Secondary | ICD-10-CM | POA: Diagnosis not present

## 2022-11-21 DIAGNOSIS — E785 Hyperlipidemia, unspecified: Secondary | ICD-10-CM | POA: Diagnosis not present

## 2022-11-21 DIAGNOSIS — R7989 Other specified abnormal findings of blood chemistry: Secondary | ICD-10-CM | POA: Diagnosis not present

## 2022-11-22 LAB — PSA: Prostate Specific Ag, Serum: 0.1 ng/mL (ref 0.0–4.0)

## 2022-11-22 LAB — LIPID PANEL: Triglycerides: 174 mg/dL — ABNORMAL HIGH (ref 0–149)

## 2022-11-22 LAB — BASIC METABOLIC PANEL: BUN: 25 mg/dL (ref 8–27)

## 2022-11-30 NOTE — Addendum Note (Signed)
Addended by: Margaretha Sheffield on: 11/30/2022 04:13 PM   Modules accepted: Orders

## 2022-12-02 ENCOUNTER — Other Ambulatory Visit: Payer: Self-pay | Admitting: Family Medicine

## 2022-12-05 ENCOUNTER — Other Ambulatory Visit: Payer: Self-pay | Admitting: Family Medicine

## 2022-12-05 DIAGNOSIS — Z79891 Long term (current) use of opiate analgesic: Secondary | ICD-10-CM

## 2022-12-06 ENCOUNTER — Other Ambulatory Visit: Payer: Self-pay | Admitting: Family Medicine

## 2022-12-06 NOTE — Progress Notes (Signed)
   Care Guide Note  12/06/2022 Name: Terry [name] MRN: [id number] DOB: July 05, 1960  Referred by: Babs Sciara, MD Reason for referral : Care Coordination (Outreach to schedule with Pharm d  New MM DM )   Terry [name] is a 62 y.o. year old male who is a primary care patient of Luking, Jonna Coup, MD. Sander Radon was referred to the pharmacist for assistance related to DM.    A third unsuccessful telephone outreach was attempted today to contact the patient who was referred to the pharmacy team for assistance with medication management. The Population Health team is pleased to engage with this patient at any time in the future upon receipt of referral and should he/she be interested in assistance from the [facility] team.   Penne Lash, RMA Care Guide [facility]  [place], [place] [address] Direct Dial: [phone] [email or web address]

## 2022-12-07 MED ORDER — OXYCODONE-ACETAMINOPHEN 10-325 MG PO TABS: ORAL_TABLET | ORAL | 0 refills | Status: DC

## 2022-12-16 ENCOUNTER — Ambulatory Visit (INDEPENDENT_AMBULATORY_CARE_PROVIDER_SITE_OTHER): Payer: BC Managed Care – PPO | Admitting: Podiatry

## 2022-12-16 ENCOUNTER — Encounter: Payer: Self-pay | Admitting: Podiatry

## 2022-12-16 VITALS — BP 155/68 | HR 67

## 2022-12-16 DIAGNOSIS — M722 Plantar fascial fibromatosis: Secondary | ICD-10-CM

## 2022-12-16 MED ORDER — BETAMETHASONE SOD PHOS & ACET 6 (3-3) MG/ML IJ SUSP
3.0000 mg | Freq: Once | INTRAMUSCULAR | Status: AC
Start: 2022-12-16 — End: 2022-12-16
  Administered 2022-12-16: 3 mg via INTRA_ARTICULAR

## 2022-12-16 NOTE — Progress Notes (Signed)
Chief Complaint  Patient presents with   Plantar Fasciitis    "It's a whole lot better than it was when I first started coming but same as it was last time."    Subjective: 62 y.o. male presenting today for follow-up valuation of plantar fasciitis to the left heel.  The heel continues to be stable.  He continues to have some slight tenderness but overall there is improvement.  He says that his heel is almost at the baseline and it has not gotten worse since last visit.  He says that the OTC arch supports as well as the meloxicam and injections helped significantly   Past Medical History:  Diagnosis Date   Diabetes mellitus without complication (HCC) 2004   GERD (gastroesophageal reflux disease)    Hyperlipidemia    Hypertension    Osteoarthritis    Past Surgical History:  Procedure Laterality Date   ANKLE SURGERY Left 03/28/2005   2 plates/screws   COLONOSCOPY N/A 08/04/2017   Procedure: COLONOSCOPY;  Surgeon: Malissa Hippo, MD;  [facility];  Service: Endoscopy;  Laterality: N/A;   ESOPHAGEAL DILATION N/A 08/04/2017   Procedure: ESOPHAGEAL DILATION;  Surgeon: Malissa Hippo, MD;  [facility];  Service: Endoscopy;  Laterality: N/A;   ESOPHAGOGASTRODUODENOSCOPY N/A 08/04/2017   Procedure: ESOPHAGOGASTRODUODENOSCOPY (EGD);  Surgeon: Malissa Hippo, MD;  [facility];  Service: Endoscopy;  Laterality: N/A;   POLYPECTOMY  08/04/2017   Procedure: POLYPECTOMY;  Surgeon: Malissa Hippo, MD;  [facility];  Service: Endoscopy;;   ROOT CANAL     Allergies  Allergen Reactions   Invokana [Canagliflozin] Other (See Comments)    laryngitis   Trulicity [Dulaglutide] Swelling    Patient also did not tolerate because of blistering in the mouth would avoid all GLP-1's patient was hospitalized with diabetic ketoacidosis August 2023   Altace [Ramipril] Cough   Metformin And Related Diarrhea     Objective: Physical Exam General: The  patient is alert and oriented x3 in no acute distress.  Dermatology: Skin is warm, dry and supple bilateral lower extremities. Negative for open lesions or macerations bilateral.  There is a focal skin lesion also noted to the lateral aspect of the left ankle.  It is currently not open but there is a well adhered eschar/scab with associated tenderness.  This directly correlates on radiographic exam with an underlying screw from prior ORIF of the left ankle  Vascular: Dorsalis Pedis and Posterior Tibial pulses palpable bilateral.  Capillary fill time is immediate to all digits.  Neurological: Grossly in tact via light touch  Musculoskeletal: Overall significant improvement there continues to be some mild tenderness to palpation to the plantar aspect of the left heel along the plantar fascia. All other joints range of motion within normal limits bilateral. Strength 5/5 in all groups bilateral.   Radiographic exam LT foot 07/18/2022: Normal osseous mineralization. Joint spaces preserved. No fracture/dislocation/boney destruction. No other soft tissue abnormalities or radiopaque foreign bodies. Orthopedic hardware noted to the left ankle  Assessment: 1. Plantar fasciitis left foot 2.  Symptomatic skin lesion lateral aspect of left ankle correlating directly to underlying orthopedic hardware from prior ankle surger x 15 years ago  Plan of Care:  -Patient evaluated.   -Injection of 0.5cc Celestone soluspan injected into the left plantar fascia.  - Continue meloxicam 15 mg daily as needed -Continue OTC prefabricated PowerStep insoles.  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 Felecia Shelling, DPM    [address] [place], [place] [address]                Office [phone]  Fax [phone]

## 2022-12-20 ENCOUNTER — Ambulatory Visit: Payer: BC Managed Care – PPO | Admitting: Family Medicine

## 2022-12-20 ENCOUNTER — Other Ambulatory Visit: Payer: Self-pay | Admitting: Family Medicine

## 2022-12-20 VITALS — BP 150/60 | HR 72 | Temp 98.0°F | Ht 70.0 in | Wt 213.0 lb

## 2022-12-20 DIAGNOSIS — E1169 Type 2 diabetes mellitus with other specified complication: Secondary | ICD-10-CM

## 2022-12-20 DIAGNOSIS — E1165 Type 2 diabetes mellitus with hyperglycemia: Secondary | ICD-10-CM

## 2022-12-20 DIAGNOSIS — R7989 Other specified abnormal findings of blood chemistry: Secondary | ICD-10-CM

## 2022-12-20 DIAGNOSIS — E785 Hyperlipidemia, unspecified: Secondary | ICD-10-CM

## 2022-12-20 DIAGNOSIS — Z23 Encounter for immunization: Secondary | ICD-10-CM

## 2022-12-20 DIAGNOSIS — Z794 Long term (current) use of insulin: Secondary | ICD-10-CM

## 2022-12-20 DIAGNOSIS — Z79891 Long term (current) use of opiate analgesic: Secondary | ICD-10-CM

## 2022-12-20 MED ORDER — OXYCODONE-ACETAMINOPHEN 10-325 MG PO TABS: ORAL_TABLET | ORAL | 0 refills | Status: DC

## 2022-12-20 MED ORDER — OXYCODONE-ACETAMINOPHEN 10-325 MG PO TABS
ORAL_TABLET | ORAL | 0 refills | Status: DC
Start: 2022-12-20 — End: 2023-03-21

## 2022-12-20 NOTE — Progress Notes (Signed)
Subjective:    Patient ID: Terry [name], male    DOB: 10/11/1960, 62 y.o.   MRN: [id number]  HPI Pain management - takes 4 to 5 pills per day prn  Last dose last night , no side effects reported , most recent toxassure 07/2022, refills needed  Diabetes 4 month follow up   This patient was seen today for chronic pain  The medication list was reviewed and updated.   Location of Pain for which the patient has been treated with regarding narcotics: Bilateral osteoarthritis of the hands as well as neuropathy in the feet  Onset of this pain: Present for years   -Compliance with medication: Good compliance  - Number patient states they take daily: Takes 3 or 4 daily  -Reason for ongoing use of opioids osteoarthritis of the hands bilateral neuropathy of the feet painful  What other measures have been tried outside of opioids Tylenol, NSAIDs, gabapentin, Lyrica  In the ongoing specialists regarding this condition none currently  -when was the last dose patient took?  Earlier today  The patient was advised the importance of maintaining medication and not using illegal substances with these.  Here for refills and follow up  The patient was educated that we can provide 3 monthly scripts for their medication, it is their responsibility to follow the instructions.  Side effects or complications from medications: Denies side effects  Patient is aware that pain medications are meant to minimize the severity of the pain to allow their pain levels to improve to allow for better function. They are aware of that pain medications cannot totally remove their pain.  Due for UDT ( at least once per year) (pain management contract is also completed at the time of the UDT): May 2024  Scale of 1 to 10 ( 1 is least 10 is most) Your pain level without the medicine: 8 Your pain level with medication 5  Scale 1 to 10 ( 1-helps very little, 10 helps very well) How well does your pain  medication reduce your pain so you can function better through out the day?  7  Quality of the pain: Throbbing aching  Persistence of the pain: Present all the time  Modifying factors: Worse with activity      Review of Systems     Objective:   Physical Exam  General-in no acute distress Eyes-no discharge Lungs-respiratory rate normal, CTA CV-no murmurs,RRR Extremities skin warm dry no edema Neuro grossly normal Behavior normal, alert  Results for orders placed or performed in visit on 11/04/22  HgB A1c  Result Value Ref Range   Hemoglobin A1C 9.5 (A) 4.0 - 5.6 %   HbA1c POC (<> result, manual entry)     HbA1c, POC (prediabetic range)     HbA1c, POC (controlled diabetic range)          Assessment & Plan:   1. Immunization due Today - Flu vaccine trivalent PF, 6mos and older(Flulaval,Afluria,Fluarix,Fluzone)  2. Hyperlipidemia associated with type 2 diabetes mellitus (HCC) Continue medication labs reviewed  3. Type 2 diabetes mellitus with hyperglycemia, with long-term current use of insulin (HCC) A1c not under good control better dietary measures needs to do meal planning to avoid quick lunches - Microalbumin/Creatinine Ratio, Urine  4. Elevated serum creatinine Important to recheck lab work can find the result of his kidney function when he is well-hydrated including urine micro protein He is not a good candidate for SGLT2 - Basic Metabolic Panel   The patient  was seen in followup for chronic pain. A review over at their current pain status was discussed. Drug registry was checked. Prescriptions were given.  Regular follow-up recommended. Discussion was held regarding the importance of compliance with medication as well as pain medication contract.  Patient was informed that medication may cause drowsiness and should not be combined  with other medications/alcohol or street drugs. If the patient feels medication is causing altered alertness then do not  drive or operate dangerous equipment.  Should be noted that the patient appears to be meeting appropriate use of opioids and response.  Evidenced by improved function and decent pain control without significant side effects and no evidence of overt aberrancy issues.  Upon discussion with the patient today they understand that opioid therapy is optional and they feel that the pain has been refractory to reasonable conservative measures and is significant and affecting quality of life enough to warrant ongoing therapy and wishes to continue opioids.  Refills were provided.  [facility] medical Board guidelines regarding the pain medicine has been reviewed.  CDC guidelines most updated 2022 has been reviewed by the prescriber.  PDMP is checked on a regular basis yearly urine drug screen and pain management contract Drug registry checked 3 prescription sent in Follow-up 3 months

## 2023-01-09 ENCOUNTER — Other Ambulatory Visit: Payer: Self-pay | Admitting: Family Medicine

## 2023-01-27 DIAGNOSIS — R7989 Other specified abnormal findings of blood chemistry: Secondary | ICD-10-CM | POA: Diagnosis not present

## 2023-01-27 DIAGNOSIS — Z794 Long term (current) use of insulin: Secondary | ICD-10-CM | POA: Diagnosis not present

## 2023-01-27 DIAGNOSIS — E1165 Type 2 diabetes mellitus with hyperglycemia: Secondary | ICD-10-CM | POA: Diagnosis not present

## 2023-01-29 LAB — BASIC METABOLIC PANEL
BUN/Creatinine Ratio: 17 (ref 10–24)
BUN: 23 mg/dL (ref 8–27)
CO2: 24 mmol/L (ref 20–29)
Calcium: 10.6 mg/dL — ABNORMAL HIGH (ref 8.6–10.2)
Chloride: 97 mmol/L (ref 96–106)
Creatinine, Ser: 1.34 mg/dL — ABNORMAL HIGH (ref 0.76–1.27)
Glucose: 316 mg/dL — ABNORMAL HIGH (ref 70–99)
Potassium: 5.1 mmol/L (ref 3.5–5.2)
Sodium: 138 mmol/L (ref 134–144)
eGFR: 60 mL/min/{1.73_m2} (ref >59)

## 2023-01-29 LAB — MICROALBUMIN / CREATININE URINE RATIO
Creatinine, Urine: 92.7 mg/dL
Microalb/Creat Ratio: 156 mg/g{creat} — ABNORMAL HIGH (ref 0–29)
Microalbumin, Urine: 144.2 ug/mL

## 2023-01-31 ENCOUNTER — Other Ambulatory Visit: Payer: Self-pay | Admitting: Family Medicine

## 2023-02-10 ENCOUNTER — Ambulatory Visit: Payer: BC Managed Care – PPO | Admitting: Nurse Practitioner

## 2023-02-10 DIAGNOSIS — I10 Essential (primary) hypertension: Secondary | ICD-10-CM

## 2023-02-10 DIAGNOSIS — E782 Mixed hyperlipidemia: Secondary | ICD-10-CM

## 2023-02-10 DIAGNOSIS — Z794 Long term (current) use of insulin: Secondary | ICD-10-CM

## 2023-02-10 DIAGNOSIS — Z7984 Long term (current) use of oral hypoglycemic drugs: Secondary | ICD-10-CM

## 2023-02-10 DIAGNOSIS — N182 Chronic kidney disease, stage 2 (mild): Secondary | ICD-10-CM

## 2023-02-13 ENCOUNTER — Other Ambulatory Visit (HOSPITAL_COMMUNITY): Payer: Self-pay

## 2023-02-14 ENCOUNTER — Other Ambulatory Visit: Payer: Self-pay | Admitting: Student

## 2023-02-16 ENCOUNTER — Other Ambulatory Visit: Payer: Self-pay | Admitting: Family Medicine

## 2023-02-17 ENCOUNTER — Other Ambulatory Visit: Payer: Self-pay | Admitting: Student

## 2023-03-01 ENCOUNTER — Other Ambulatory Visit: Payer: Self-pay | Admitting: Nurse Practitioner

## 2023-03-03 ENCOUNTER — Encounter: Payer: Self-pay | Admitting: Nurse Practitioner

## 2023-03-03 ENCOUNTER — Ambulatory Visit (INDEPENDENT_AMBULATORY_CARE_PROVIDER_SITE_OTHER): Payer: BC Managed Care – PPO | Admitting: Nurse Practitioner

## 2023-03-03 VITALS — BP 138/80 | HR 93 | Ht 70.0 in | Wt 208.0 lb

## 2023-03-03 DIAGNOSIS — Z794 Long term (current) use of insulin: Secondary | ICD-10-CM | POA: Diagnosis not present

## 2023-03-03 DIAGNOSIS — N1831 Chronic kidney disease, stage 3a: Secondary | ICD-10-CM | POA: Diagnosis not present

## 2023-03-03 DIAGNOSIS — I10 Essential (primary) hypertension: Secondary | ICD-10-CM | POA: Diagnosis not present

## 2023-03-03 DIAGNOSIS — E1122 Type 2 diabetes mellitus with diabetic chronic kidney disease: Secondary | ICD-10-CM | POA: Diagnosis not present

## 2023-03-03 DIAGNOSIS — E782 Mixed hyperlipidemia: Secondary | ICD-10-CM

## 2023-03-03 DIAGNOSIS — Z7984 Long term (current) use of oral hypoglycemic drugs: Secondary | ICD-10-CM | POA: Diagnosis not present

## 2023-03-03 LAB — POCT GLYCOSYLATED HEMOGLOBIN (HGB A1C): Hemoglobin A1C: 8.8 % — AB (ref 4.0–5.6)

## 2023-03-03 MED ORDER — TOUJEO MAX SOLOSTAR 300 UNIT/ML ~~LOC~~ SOPN: 120.0000 [IU] | PEN_INJECTOR | Freq: Every evening | SUBCUTANEOUS | 3 refills | Status: DC

## 2023-03-03 MED ORDER — INSULIN LISPRO 100 UNIT/ML IJ SOLN: 41.0000 [IU] | Freq: Three times a day (TID) | INTRAMUSCULAR | 3 refills | Status: DC

## 2023-03-03 MED ORDER — DEXCOM G7 SENSOR MISC: 1.0000 | 3 refills | Status: DC

## 2023-03-03 MED ORDER — GLIPIZIDE ER 5 MG PO TB24: 5.0000 mg | ORAL_TABLET | Freq: Every day | ORAL | 3 refills | Status: DC

## 2023-03-03 NOTE — Progress Notes (Addendum)
Endocrinology Follow Up Visit      03/03/2023, 9:32 AM   Subjective:    Patient ID: Terry [name], male    DOB: 13-Dec-1960.  Terry [name] is being seen in follow up after being seen in consultation for management of currently uncontrolled symptomatic diabetes requested by  Babs Sciara, MD.   Past Medical History:  Diagnosis Date   Diabetes mellitus without complication (HCC) 2004   GERD (gastroesophageal reflux disease)    Hyperlipidemia    Hypertension    Osteoarthritis     Past Surgical History:  Procedure Laterality Date   ANKLE SURGERY Left 03/28/2005   2 plates/screws   COLONOSCOPY N/A 08/04/2017   Procedure: COLONOSCOPY;  Surgeon: Malissa Hippo, MD;  [facility];  Service: Endoscopy;  Laterality: N/A;   ESOPHAGEAL DILATION N/A 08/04/2017   Procedure: ESOPHAGEAL DILATION;  Surgeon: Malissa Hippo, MD;  [facility];  Service: Endoscopy;  Laterality: N/A;   ESOPHAGOGASTRODUODENOSCOPY N/A 08/04/2017   Procedure: ESOPHAGOGASTRODUODENOSCOPY (EGD);  Surgeon: Malissa Hippo, MD;  [facility];  Service: Endoscopy;  Laterality: N/A;   POLYPECTOMY  08/04/2017   Procedure: POLYPECTOMY;  Surgeon: Malissa Hippo, MD;  [facility]: [facility];  Service: Endoscopy;;   ROOT CANAL      Social History   Socioeconomic History   Marital status: Married    Spouse name: Not on file   Number of children: Not on file   Years of education: Not on file   Highest education level: Not on file  Occupational History   Not on file  Tobacco Use   Smoking status: Former    Current packs/day: 0.00    Types: Cigarettes    Quit date: 06/15/1992    Years since quitting: 30.7   Smokeless tobacco: Never  Vaping Use   Vaping status: Never Used  Substance and Sexual Activity   Alcohol use: Never   Drug use: Not Currently    Types: Marijuana    Comment: last used 1970    Sexual activity: Yes  Other Topics Concern   Not on file  Social History Narrative   Not on file   Social Determinants of Health   Financial Resource Strain: Not on file  Food Insecurity: Not on file  Transportation Needs: Not on file  Physical Activity: Not on file  Stress: Not on file  Social Connections: Not on file    Family History  Problem Relation Age of Onset   Hypertension Mother    Diabetes Mother    Heart disease Mother    Hyperlipidemia Mother    Cancer Father        lung    Outpatient Encounter Medications as of 03/03/2023  Medication Sig   amLODipine (NORVASC) 10 MG tablet TAKE (1) TABLET BY MOUTH ONCE DAILY.   aspirin EC 81 MG tablet Take 1 tablet (81 mg total) by mouth daily with breakfast.   Continuous Glucose Sensor (DEXCOM G7 SENSOR) MISC Inject 1 Application into the skin as directed. Change sensor every 10 days as directed.   Continuous Glucose Transmitter (DEXCOM G6 TRANSMITTER) MISC Change transmitter every 90 days   ezetimibe (ZETIA)  10 MG tablet Take 1 tablet (10 mg total) by mouth daily.   fenofibrate 160 MG tablet Take 1 tablet (160 mg total) by mouth daily.   glucose blood (CONTOUR NEXT TEST) test strip Use as instructed to monitor glucose 4 times daily   indapamide (LOZOL) 2.5 MG tablet TAKE (1) TABLET BY MOUTH ONCE DAILY.   Insulin Pen Needle (PEN NEEDLES) 31G X 6 MM MISC Use to inject insulin 4 times daily   metoprolol succinate (TOPROL-XL) 100 MG 24 hr tablet TAKE (1) TABLET BY MOUTH ONCE DAILY.   Multiple Vitamin (MULTIVITAMIN) tablet Take 1 tablet by mouth daily.   oxyCODONE-acetaminophen (PERCOCET) 10-325 MG tablet 1 taken 4 times daily as needed for pain   oxyCODONE-acetaminophen (PERCOCET) 10-325 MG tablet 1 taken 4 times daily as needed for pain   oxyCODONE-acetaminophen (PERCOCET) 10-325 MG tablet 1  taken 4 times daily as needed for pain   pantoprazole (PROTONIX) 40 MG tablet Take 1 tablet (40 mg total) by mouth 2 (two) times daily.    rosuvastatin (CRESTOR) 40 MG tablet TAKE (1) TABLET BY MOUTH ONCE DAILY.   sildenafil (REVATIO) 20 MG tablet Take 1-5 tablets before sexual activity as directed. No greater that 5 tablets in a day.   triamcinolone cream (KENALOG) 0.1 % APPLY TO AFFECTED AREA SPARINGLY THREE TIMES DAILY.   valsartan (DIOVAN) 160 MG tablet TAKE (1) TABLET BY MOUTH ONCE DAILY.   [DISCONTINUED] Continuous Glucose Sensor (DEXCOM G6 SENSOR) MISC Change sensor every 10 days   [DISCONTINUED] glipiZIDE (GLUCOTROL XL) 5 MG 24 hr tablet TAKE (1) TABLET BY MOUTH ONCE DAILY WITH BREAKFAST.   [DISCONTINUED] insulin glargine, 2 Unit Dial, (TOUJEO MAX SOLOSTAR) 300 UNIT/ML Solostar Pen Inject 100 Units into the skin at bedtime.   [DISCONTINUED] insulin lispro (HUMALOG) 100 UNIT/ML injection Inject 0.35-0.41 mLs (35-41 Units total) into the skin 3 (three) times daily with meals.   glipiZIDE (GLUCOTROL XL) 5 MG 24 hr tablet Take 1 tablet (5 mg total) by mouth daily with breakfast.   insulin glargine, 2 Unit Dial, (TOUJEO MAX SOLOSTAR) 300 UNIT/ML Solostar Pen Inject 120 Units into the skin at bedtime.   insulin lispro (HUMALOG) 100 UNIT/ML injection Inject 0.41 mLs (41 Units total) into the skin 3 (three) times daily with meals.   No facility-administered encounter medications on file as of 03/03/2023.    ALLERGIES: Allergies  Allergen Reactions   Invokana [Canagliflozin] Other (See Comments)    laryngitis   Trulicity [Dulaglutide] Swelling    Patient also did not tolerate because of blistering in the mouth would avoid all GLP-1's patient was hospitalized with diabetic ketoacidosis August 2023   Altace [Ramipril] Cough   Metformin And Related Diarrhea    VACCINATION STATUS: Immunization History  Administered Date(s) Administered   Influenza, Seasonal, Injecte, Preservative Fre 12/20/2022   Influenza,inj,Quad PF,6+ Mos 01/30/2014, 01/29/2016, 01/30/2017, 01/26/2018, 01/23/2019, 01/17/2020, 01/01/2021    Influenza-Unspecified 01/26/2012, 02/21/2015, 01/10/2022   Pneumococcal Polysaccharide-23 01/30/2014   Td 12/19/2014    Diabetes He presents for his follow-up diabetic visit. He has type 2 diabetes mellitus. Onset time: Diagnosed at approximate age of 61. His disease course has been improving. There are no hypoglycemic associated symptoms. Pertinent negatives for hypoglycemia include no nervousness/anxiousness. Associated symptoms include fatigue. Pertinent negatives for diabetes include no blurred vision, no polydipsia, no polyphagia, no polyuria and no weight loss. There are no hypoglycemic complications. Nocturnal hypoglycemia: mild. Symptoms are stable. Diabetic complications include nephropathy. Risk factors for coronary artery disease include diabetes mellitus, dyslipidemia,  hypertension, male sex, obesity, stress and sedentary lifestyle. Current diabetic treatment includes intensive insulin program and oral agent (monotherapy). He is compliant with treatment most of the time. His weight is increasing steadily. He is following a generally healthy diet. When asked about meal planning, he reported none. He has not had a previous visit with a dietitian. He participates in exercise intermittently. His home blood glucose trend is fluctuating dramatically. His overall blood glucose range is >200 mg/dl. (He presents today with his CGM and logs showing fluctuating glycemic profile.  His POCT A1c today is 8.8%, improving from last visit of 9.5%.  Analysis of his CGM shows TIR 21%, TAR 79%, TBR 0% with a GMI of 9.4%.  He has unpredictable insulin sensitivity.  It appears his insulin works sometimes and other times it does not.  He endorses a relatively healthy diet, eating mostly the same things day in and day out.) An ACE inhibitor/angiotensin II receptor blocker is being taken. He does not see a podiatrist.Eye exam is not current.  Hypertension This is a chronic problem. The current episode started more than 1  year ago. The problem has been resolved since onset. The problem is controlled. Pertinent negatives include no blurred vision. There are no associated agents to hypertension. Risk factors for coronary artery disease include diabetes mellitus, dyslipidemia, male gender, obesity and stress. Past treatments include angiotensin blockers and calcium channel blockers. The current treatment provides mild improvement. Compliance problems include diet and exercise.  Hypertensive end-organ damage includes kidney disease. Identifiable causes of hypertension include chronic renal disease.  Hyperlipidemia This is a chronic problem. The current episode started more than 1 year ago. The problem is uncontrolled. Recent lipid tests were reviewed and are variable. Exacerbating diseases include chronic renal disease and diabetes. Factors aggravating his hyperlipidemia include fatty foods. Current antihyperlipidemic treatment includes statins and fibric acid derivatives. The current treatment provides moderate improvement of lipids. Compliance problems include adherence to diet, adherence to exercise and psychosocial issues.  Risk factors for coronary artery disease include diabetes mellitus, dyslipidemia, hypertension, male sex and obesity.    Review of systems  Constitutional: + steadily decreasing body weight,  current Body mass index is 29.84 kg/m. , no fatigue, no subjective hyperthermia, no subjective hypothermia Eyes: no blurry vision, no xerophthalmia ENT: no sore throat, no nodules palpated in throat, no dysphagia/odynophagia, no hoarseness Cardiovascular: no chest pain, no shortness of breath, no palpitations, no leg swelling Respiratory: no cough, no shortness of breath Gastrointestinal: no nausea/vomiting/diarrhea Musculoskeletal: no muscle/joint aches Skin: no rashes, no hyperemia Neurological: no tremors, no numbness, no tingling, no dizziness Psychiatric: no depression, no anxiety   Objective:      BP 138/80 (BP Location: Left Arm, Patient Position: Sitting, Cuff Size: Large)   Pulse 93   Ht 5\' 10"  (1.778 m)   Wt 208 lb (94.3 kg)   BMI 29.84 kg/m   Wt Readings from Last 3 Encounters:  03/03/23 208 lb (94.3 kg)  12/20/22 213 lb (96.6 kg)  11/04/22 218 lb 12.8 oz (99.2 kg)    BP Readings from Last 3 Encounters:  03/03/23 138/80  12/20/22 (!) 150/60  12/16/22 (!) 155/68      Physical Exam- Limited  Constitutional:  Body mass index is 29.84 kg/m. , not in acute distress, normal state of mind Eyes:  EOMI, no exophthalmos Musculoskeletal: no gross deformities, strength intact in all four extremities, no gross restriction of joint movements Skin:  no rashes, no hyperemia Neurological: no tremor with outstretched  hands    Diabetic Foot Exam - Simple   No data filed     CMP ( most recent) CMP     Component Value Date/Time   NA 138 01/27/2023 0851   K 5.1 01/27/2023 0851   CL 97 01/27/2023 0851   CO2 24 01/27/2023 0851   GLUCOSE 316 (H) 01/27/2023 0851   GLUCOSE 264 (H) 11/25/2021 0422   BUN 23 01/27/2023 0851   CREATININE 1.34 (H) 01/27/2023 0851   CREATININE 0.91 02/25/2014 0727   CALCIUM 10.6 (H) 01/27/2023 0851   PROT 7.3 03/17/2022 0917   ALBUMIN 4.6 03/17/2022 0917   AST 20 03/17/2022 0917   ALT 24 03/17/2022 0917   ALKPHOS 56 03/17/2022 0917   BILITOT 0.3 03/17/2022 0917   GFRNONAA 52 (L) 11/25/2021 0422   GFRAA 66 01/17/2020 0928     Diabetic Labs (most recent): Lab Results  Component Value Date   HGBA1C 8.8 (A) 03/03/2023   HGBA1C 9.5 (A) 11/04/2022   HGBA1C 10.2 (A) 07/29/2022   MICROALBUR 150 07/09/2021   MICROALBUR 150 03/09/2020   MICROALBUR 3.36 (H) 05/11/2013     Lipid Panel ( most recent) Lipid Panel     Component Value Date/Time   CHOL 103 11/21/2022 0805   TRIG 174 (H) 11/21/2022 0805   HDL 32 (L) 11/21/2022 0805   CHOLHDL 3.2 11/21/2022 0805   CHOLHDL 5.6 08/06/2021 0806   VLDL 52 (H) 08/06/2021 0806   LDLCALC 42  11/21/2022 0805   LABVLDL 29 11/21/2022 0805      Lab Results  Component Value Date   TSH 0.713 04/30/2021   TSH 1.280 06/29/2020   TSH 1.470 11/11/2019   TSH 2.144 04/04/2019   TSH 0.986 07/01/2015   FREET4 1.39 06/29/2020   FREET4 1.44 11/11/2019           Assessment & Plan:   1) DM type 2 without retinopathy (HCC)  - Terry [name] has currently uncontrolled symptomatic type 2 DM since  62 years of age.  He presents today with his CGM and logs showing fluctuating glycemic profile.  His POCT A1c today is 8.8%, improving from last visit of 9.5%.  Analysis of his CGM shows TIR 21%, TAR 79%, TBR 0% with a GMI of 9.4%.  He has unpredictable insulin sensitivity.  It appears his insulin works sometimes and other times it does not.  He endorses a relatively healthy diet, eating mostly the same things day in and day out.   Recent labs reviewed.   - I had a long discussion with him about the progressive nature of diabetes and the pathology behind its complications. -his diabetes is complicated by CKD stage 3a and he remains at a high risk for more acute and chronic complications which include CAD, CVA, CKD, retinopathy, and neuropathy. These are all discussed in detail with him.  - Nutritional counseling repeated at each appointment due to patients tendency to fall back in to old habits.  - The patient admits there is a room for improvement in their diet and drink choices. -  Suggestion is made for the patient to avoid simple carbohydrates from their diet including Cakes, Sweet Desserts / Pastries, Ice Cream, Soda (diet and regular), Sweet Tea, Candies, Chips, Cookies, Sweet Pastries, Store Bought Juices, Alcohol in Excess of 1-2 drinks a day, Artificial Sweeteners, Coffee Creamer, and "Sugar-free" Products. This will help patient to have stable blood glucose profile and potentially avoid unintended weight gain.   - I encouraged the  patient to switch to unprocessed or minimally  processed complex starch and increased protein intake (animal or plant source), fruits, and vegetables.   - Patient is advised to stick to a routine mealtimes to eat 3 meals a day and avoid unnecessary snacks (to snack only to correct hypoglycemia).  - I have approached him with the following individualized plan to manage  his diabetes and patient agrees:   -We discussed necessary changes in diet to prevent dramatic fluctuations in glucose and for safety purposes he needs to be eating to inject his insulin.  He did seem to have better response to basal/bolus combo rather than U500.  -He is advised to increase his Toujeo to 120 units SQ nightly (not seeing optimal coverage at night) and continue his Humalog 35-41 units TID with meals if glucose is above 90 and he is eating (Specific instructions on how to titrate insulin dosage based on glucose readings given to patient in writing).  He can also continue Glipizide 5 mg XL daily with breakfast.    I did ask he keep a food diary so we can look through it at next visit and see if any improvements can be made to help his insulin sensitivity.  -He is encouraged to continue monitoring glucose 4 times daily (using his CGM), before meals and before bed, and to call the clinic if he has readings less than 70 or greater than 300 for 3 tests in a row.  I did upgrade his CGM to Dexcom G7.  - he is warned not to take insulin without proper monitoring per orders.  -He is NOT candidate for any GLP1 due to severe allergic reaction to Trulicity.  He did not tolerate Metformin in the past (even in the ER form).  He has been on SGLT2i and did not tolerate them due to chronic laryngitis.  We did also discuss insulin pumps and encouraged him to reach out to his insurance for coverage/price options for Omnipod DASH/5 and or Tandem T-Slim.  Says all of these options are too expensive for his budget right now.  - Specific targets for  A1c;  LDL, HDL,  and Triglycerides  were discussed with the patient.  2) Blood Pressure /Hypertension: His blood pressure is controlled to target.  He is advised to continue Diovan 160 mg, Amlodipine 10 mg po daily, Indapamide 2.5 mg po daily, and Metoprolol 100 mg po daily.      3) Lipids/Hyperlipidemia:    His most recent lipid panel from 11/21/22 shows controlled LDL of 42 and elevated triglycerides of 174 (drastically improved).  He is advised to continue Crestor 40 mg po daily at bedtime and Fenofibrate 160 mg po daily.  Side effects and precautions discussed with him.  We discussed the need to avoid fried foods and butter to reduce his risk of cardiovascular events.  4)  Weight/Diet:  His Body mass index is 29.84 kg/m.  -   he is a candidate for weight loss. I discussed with him the fact that loss of 5 - 10% of his  current body weight will have the most impact on his diabetes management.  Exercise, and detailed carbohydrates information provided  -  detailed on discharge instructions.  5) Chronic Care/Health Maintenance: -he on ACEI/ARB and Statin medications and  is encouraged to initiate and continue to follow up with Ophthalmology, Dentist, Podiatrist at least yearly or according to recommendations, and advised to stay away from smoking. I have recommended yearly flu vaccine and pneumonia vaccine  at least every 5 years; moderate intensity exercise for up to 150 minutes weekly; and  sleep for at least 7 hours a day.  - he is advised to maintain close follow up with Babs Sciara, MD for primary care needs, as well as his other providers for optimal and coordinated care.     I spent  46  minutes in the care of the patient today including review of labs from CMP, Lipids, Thyroid Function, Hematology (current and previous including abstractions from other facilities); face-to-face time discussing  his blood glucose readings/logs, discussing hypoglycemia and hyperglycemia episodes and symptoms, medications doses, his options  of short and long term treatment based on the latest standards of care / guidelines;  discussion about incorporating lifestyle medicine;  and documenting the encounter. Risk reduction counseling performed per USPSTF guidelines to reduce obesity and cardiovascular risk factors.     Please refer to Patient Instructions for Blood Glucose Monitoring and Insulin/Medications Dosing Guide"  in media tab for additional information. Please  also refer to " Patient Self Inventory" in the Media  tab for reviewed elements of pertinent patient history.  Terry [name] participated in the discussions, expressed understanding, and voiced agreement with the above plans.  All questions were answered to his satisfaction. he is encouraged to contact clinic should he have any questions or concerns prior to his return visit.   Follow up plan: - Return in about 3 months (around 06/01/2023) for Diabetes F/U with A1c in office, No previsit labs, Bring meter and logs.   Ronny Bacon, [facility] [address] [place], [place] [address] Phone: [phone] Fax: [phone]  03/03/2023, 9:32 AM

## 2023-03-08 ENCOUNTER — Other Ambulatory Visit: Payer: Self-pay | Admitting: Family Medicine

## 2023-03-17 ENCOUNTER — Ambulatory Visit: Payer: BC Managed Care – PPO | Admitting: Family Medicine

## 2023-03-21 ENCOUNTER — Encounter: Payer: Self-pay | Admitting: Family Medicine

## 2023-03-21 ENCOUNTER — Ambulatory Visit: Payer: BC Managed Care – PPO | Admitting: Family Medicine

## 2023-03-21 VITALS — BP 150/78 | HR 84 | Temp 98.2°F | Wt 210.0 lb

## 2023-03-21 DIAGNOSIS — Z79891 Long term (current) use of opiate analgesic: Secondary | ICD-10-CM

## 2023-03-21 DIAGNOSIS — E785 Hyperlipidemia, unspecified: Secondary | ICD-10-CM | POA: Diagnosis not present

## 2023-03-21 DIAGNOSIS — E1169 Type 2 diabetes mellitus with other specified complication: Secondary | ICD-10-CM

## 2023-03-21 DIAGNOSIS — K130 Diseases of lips: Secondary | ICD-10-CM

## 2023-03-21 DIAGNOSIS — I10 Essential (primary) hypertension: Secondary | ICD-10-CM | POA: Diagnosis not present

## 2023-03-21 DIAGNOSIS — Z23 Encounter for immunization: Secondary | ICD-10-CM | POA: Diagnosis not present

## 2023-03-21 MED ORDER — ROSUVASTATIN CALCIUM 40 MG PO TABS: 40.0000 mg | ORAL_TABLET | Freq: Every day | ORAL | 3 refills | Status: DC

## 2023-03-21 MED ORDER — FENOFIBRATE 160 MG PO TABS: 160.0000 mg | ORAL_TABLET | Freq: Every day | ORAL | 1 refills | Status: DC

## 2023-03-21 MED ORDER — METOPROLOL SUCCINATE ER 100 MG PO TB24: ORAL_TABLET | ORAL | 0 refills | Status: DC

## 2023-03-21 MED ORDER — OXYCODONE-ACETAMINOPHEN 10-325 MG PO TABS: ORAL_TABLET | ORAL | 0 refills | Status: DC

## 2023-03-21 MED ORDER — INDAPAMIDE 2.5 MG PO TABS: ORAL_TABLET | ORAL | 0 refills | Status: DC

## 2023-03-21 MED ORDER — EZETIMIBE 10 MG PO TABS: 10.0000 mg | ORAL_TABLET | Freq: Every day | ORAL | 0 refills | Status: DC

## 2023-03-21 MED ORDER — AMLODIPINE BESYLATE 5 MG PO TABS: 5.0000 mg | ORAL_TABLET | Freq: Every day | ORAL | 1 refills | Status: DC

## 2023-03-21 MED ORDER — OXYCODONE-ACETAMINOPHEN 10-325 MG PO TABS
ORAL_TABLET | ORAL | 0 refills | Status: DC
Start: 2023-03-21 — End: 2023-06-28

## 2023-03-21 MED ORDER — VALSARTAN 160 MG PO TABS: 160.0000 mg | ORAL_TABLET | Freq: Every day | ORAL | 3 refills | Status: DC

## 2023-03-21 NOTE — Progress Notes (Signed)
Subjective:    Patient ID: Terry [name], male    DOB: 1961/02/04, 62 y.o.   MRN: [id number]  Discussed the use of AI scribe software for clinical note transcription with the patient, who gave verbal consent to proceed. This patient was seen today for chronic pain  The medication list was reviewed and updated.   Location of Pain for which the patient has been treated with regarding narcotics: Lumbar as well as hands and knees  Onset of this pain: Present for years   -Compliance with medication: Good compliance  - Number patient states they take daily: 4 tablets  -Reason for ongoing use of opioids pain unrelieved by NSAIDs Tylenol  What other measures have been tried outside of opioids NSAIDs Tylenol  In the ongoing specialists regarding this condition has seen back specialist in the past  -when was the last dose patient took?  Earlier today  The patient was advised the importance of maintaining medication and not using illegal substances with these.  Here for refills and follow up  The patient was educated that we can provide 3 monthly scripts for their medication, it is their responsibility to follow the instructions.  Side effects or complications from medications: Denies side effects  Patient is aware that pain medications are meant to minimize the severity of the pain to allow their pain levels to improve to allow for better function. They are aware of that pain medications cannot totally remove their pain.  Due for UDT ( at least once per year) (pain management contract is also completed at the time of the UDT): May 2024  Scale of 1 to 10 ( 1 is least 10 is most) Your pain level without the medicine: 9 Your pain level with medication 6  Scale 1 to 10 ( 1-helps very little, 10 helps very well) How well does your pain medication reduce your pain so you can function better through out the day?  8  Quality of the pain: Throbbing aching  Persistence of the pain:  Present all the time  Modifying factors: Worse with activity      History of Present Illness   The patient, with a history of diabetes, hypertension, and chronic pain, reports that his pain medication is effective in managing his symptoms, taking it four times a day. He also reports adherence to his acid blocker medication, which he obtains over the counter due to insurance limitations. He has not been taking amlodipine for a couple of months due to refill issues, but has been consistent with his cholesterol medication (ezetimibe), fenofibrate, glipizide, indapamide, insulin, metoprolol, and rosuvastatin. He also takes valsartan for kidney health, as indicated by a slight presence of protein in his urine.  The patient also reports a persistent soreness on his lip, which feels raw and slightly numb, particularly in the mornings. This has been ongoing since his last episode of diabetic ketoacidosis in the previous August. He has been managing this with a lip balm and Vaseline.  The patient's last eye examination was in September of the previous year, and he is currently seeking a new optometrist due to his previous one leaving the practice. He has not required any laser treatments for his eyes.  The patient's last colonoscopy was up to date, and his prostate health was good as indicated by a PSA of 0.1. His cholesterol levels were also satisfactory, with an LDL of 42. He is due for blood work in the spring.  Review of Systems     Objective:    Physical Exam   VITALS: BP- 150/74 HEENT: No signs of growth or skin changes indicative of cancer on the lip.    General-in no acute distress Eyes-no discharge Lungs-respiratory rate normal, CTA CV-no murmurs,RRR Extremities skin warm dry no edema Neuro grossly normal Behavior normal, alert        Assessment & Plan:  Assessment and Plan    Chronic Pain Pain is well controlled with current regimen. Patient reports taking 4 doses  daily. -Continue current pain management regimen.  Hypertension Patient has not been taking amlodipine for a couple of months. Blood pressure was elevated at the visit. -Resume amlodipine at 5mg  daily. -Recheck blood pressure in 1 month and consider increasing amlodipine to 10mg  daily if blood pressure remains elevated.  Hyperlipidemia Patient is compliant with ezetimibe and rosuvastatin. LDL was 42 in August 2024. -Continue ezetimibe and rosuvastatin.  Diabetes Mellitus Patient is compliant with glipizide and insulin. History of diabetic ketoacidosis in August 2023. -Continue glipizide and insulin. -Advise patient to monitor blood sugars and to contact endocrinologist if sugars consistently stay in the 300s or if feeling unwell.  Chronic Kidney Disease Proteinuria noted in November 2024. Patient is compliant with valsartan. -Continue valsartan.  Oral Health Persistent soreness and rawness under the lip. No signs of growth or skin changes suggestive of cancer on examination. Possible nerve damage. -Advise patient to continue using lip balm and Vaseline for symptom management.  General Health Maintenance -Administer pneumococcal 20 vaccine today. -Continue over-the-counter acid blocker for stomach. -Continue fenofibrate for hyperlipidemia. -Continue indapamide (diuretic). -Continue metoprolol for heart rate control. -Advise patient to have regular eye examinations. Last eye examination was in September 2024. -Plan to do blood work in spring 2025. -Follow-up in 3 months (April 2025).      1. Encounter for long-term opiate analgesic use (Primary) Continue pain medication Drug registry checked The patient was seen in followup for chronic pain. A review over at their current pain status was discussed. Drug registry was checked. Prescriptions were given.  Regular follow-up recommended. Discussion was held regarding the importance of compliance with medication as well as pain  medication contract.  Patient was informed that medication may cause drowsiness and should not be combined  with other medications/alcohol or street drugs. If the patient feels medication is causing altered alertness then do not drive or operate dangerous equipment.  Should be noted that the patient appears to be meeting appropriate use of opioids and response.  Evidenced by improved function and decent pain control without significant side effects and no evidence of overt aberrancy issues.  Upon discussion with the patient today they understand that opioid therapy is optional and they feel that the pain has been refractory to reasonable conservative measures and is significant and affecting quality of life enough to warrant ongoing therapy and wishes to continue opioids.  Refills were provided.  [facility] medical Board guidelines regarding the pain medicine has been reviewed.  CDC guidelines most updated 2022 has been reviewed by the prescriber.  PDMP is checked on a regular basis yearly urine drug screen and pain management contract  - oxyCODONE-acetaminophen (PERCOCET) 10-325 MG tablet; 1 taken 4 times daily as needed for pain  Dispense: 120 tablet; Refill: 0 - oxyCODONE-acetaminophen (PERCOCET) 10-325 MG tablet; 1  taken 4 times daily as needed for pain  Dispense: 120 tablet; Refill: 0 - oxyCODONE-acetaminophen (PERCOCET) 10-325 MG tablet; 1 taken 4 times daily as needed for pain  Dispense: 120 tablet; Refill: 0  2. Hyperlipidemia associated with type 2 diabetes mellitus (HCC) Continue statin keep LDL below 70 and below 55 if possible  3. Essential hypertension, benign Blood pressure good control up until he was unable to get his amlodipine filled currently mildly elevated recommend reinitiating amlodipine 5 mg daily if this does not get his blood pressure under good enough control bump up to 10 mg  4. Immunization due Today - Pneumococcal conjugate vaccine 20-valent (Prevnar 20)  5.  Lesion of lip No lesions seen more than likely nerve damage related to previous issue

## 2023-04-15 ENCOUNTER — Encounter: Payer: Self-pay | Admitting: Nurse Practitioner

## 2023-05-16 ENCOUNTER — Telehealth: Payer: Self-pay | Admitting: *Deleted

## 2023-05-16 NOTE — Telephone Encounter (Signed)
Patient was identified as falling into the [place] Measure - Diabetes.   Patient was: Appointment scheduled for lab or office visit for A1c. Patient has office visit scheduled with Endocrinology 06/02/2023 for office visit and HgbA1c

## 2023-06-02 ENCOUNTER — Encounter: Payer: Self-pay | Admitting: Nurse Practitioner

## 2023-06-02 ENCOUNTER — Ambulatory Visit (INDEPENDENT_AMBULATORY_CARE_PROVIDER_SITE_OTHER): Payer: BC Managed Care – PPO | Admitting: Nurse Practitioner

## 2023-06-02 VITALS — BP 140/78 | HR 76 | Ht 70.0 in | Wt 208.8 lb

## 2023-06-02 DIAGNOSIS — Z7984 Long term (current) use of oral hypoglycemic drugs: Secondary | ICD-10-CM

## 2023-06-02 DIAGNOSIS — E782 Mixed hyperlipidemia: Secondary | ICD-10-CM

## 2023-06-02 DIAGNOSIS — E1122 Type 2 diabetes mellitus with diabetic chronic kidney disease: Secondary | ICD-10-CM | POA: Diagnosis not present

## 2023-06-02 DIAGNOSIS — I129 Hypertensive chronic kidney disease with stage 1 through stage 4 chronic kidney disease, or unspecified chronic kidney disease: Secondary | ICD-10-CM

## 2023-06-02 DIAGNOSIS — R7989 Other specified abnormal findings of blood chemistry: Secondary | ICD-10-CM

## 2023-06-02 DIAGNOSIS — Z794 Long term (current) use of insulin: Secondary | ICD-10-CM | POA: Diagnosis not present

## 2023-06-02 DIAGNOSIS — E559 Vitamin D deficiency, unspecified: Secondary | ICD-10-CM | POA: Diagnosis not present

## 2023-06-02 DIAGNOSIS — I10 Essential (primary) hypertension: Secondary | ICD-10-CM

## 2023-06-02 DIAGNOSIS — N1831 Chronic kidney disease, stage 3a: Secondary | ICD-10-CM | POA: Diagnosis not present

## 2023-06-02 DIAGNOSIS — R5382 Chronic fatigue, unspecified: Secondary | ICD-10-CM

## 2023-06-02 LAB — POCT GLYCOSYLATED HEMOGLOBIN (HGB A1C): Hemoglobin A1C: 9.8 % — AB (ref 4.0–5.6)

## 2023-06-02 MED ORDER — TOUJEO MAX SOLOSTAR 300 UNIT/ML ~~LOC~~ SOPN: 140.0000 [IU] | PEN_INJECTOR | Freq: Every evening | SUBCUTANEOUS | 3 refills | Status: DC

## 2023-06-02 NOTE — Progress Notes (Signed)
 Endocrinology Follow Up Visit      06/02/2023, 8:40 AM   Subjective:    Patient ID: Terry [name], male    DOB: [birth year].  Terry [name] is being seen in follow up after being seen in consultation for management of currently uncontrolled symptomatic diabetes requested by  Babs Sciara, MD.   Past Medical History:  Diagnosis Date   Diabetes mellitus without complication (HCC) 2004   GERD (gastroesophageal reflux disease)    Hyperlipidemia    Hypertension    Osteoarthritis     Past Surgical History:  Procedure Laterality Date   ANKLE SURGERY Left 03/28/2005   2 plates/screws   COLONOSCOPY N/A 08/04/2017   Procedure: COLONOSCOPY;  Surgeon: Malissa Hippo, MD;  [facility]: [facility];  Service: Endoscopy;  Laterality: N/A;   ESOPHAGEAL DILATION N/A 08/04/2017   Procedure: ESOPHAGEAL DILATION;  Surgeon: Malissa Hippo, MD;  Location: [facility];  Service: Endoscopy;  Laterality: N/A;   ESOPHAGOGASTRODUODENOSCOPY N/A 08/04/2017   Procedure: ESOPHAGOGASTRODUODENOSCOPY (EGD);  Surgeon: Malissa Hippo, MD;  Location: [facility];  Service: Endoscopy;  Laterality: N/A;   POLYPECTOMY  08/04/2017   Procedure: POLYPECTOMY;  Surgeon: Malissa Hippo, MD;  Location: [facility];  Service: Endoscopy;;   ROOT CANAL      Social History   Socioeconomic History   Marital status: Married    Spouse name: Not on file   Number of children: Not on file   Years of education: Not on file   Highest education level: 10th grade  Occupational History   Not on file  Tobacco Use   Smoking status: Former    Current packs/day: 0.00    Types: Cigarettes    Quit date: 06/15/1992    Years since quitting: 30.9   Smokeless tobacco: Never  Vaping Use   Vaping status: Never Used  Substance and Sexual Activity   Alcohol use: Never   Drug use: Not Currently    Types: Marijuana    Comment: last used 1970    Sexual activity: Yes  Other Topics Concern   Not on file  Social History Narrative   Not on file   Social Drivers of Health   Financial Resource Strain: Medium Risk (03/17/2023)   Overall Financial Resource Strain (CARDIA)    Difficulty of Paying Living Expenses: Somewhat hard  Food Insecurity: Food Insecurity Present (03/17/2023)   Hunger Vital Sign    Worried About Running Out of Food in the Last Year: Sometimes true    Ran Out of Food in the Last Year: Never true  Transportation Needs: No Transportation Needs (03/17/2023)   PRAPARE - Administrator, Civil Service (Medical): No    Lack of Transportation (Non-Medical): No  Physical Activity: Sufficiently Active (03/17/2023)   Exercise Vital Sign    Days of Exercise per Week: 5 days    Minutes of Exercise per Session: 150+ min  Stress: No Stress Concern Present (03/17/2023)   [employer] of Occupational Health - Occupational Stress Questionnaire    Feeling of Stress : Only a little  Social Connections: Moderately Integrated (03/17/2023)   Social Connection and Isolation Panel [NHANES]  Frequency of Communication with Friends and Family: More than three times a week    Frequency of Social Gatherings with Friends and Family: Once a week    Attends Religious Services: 1 to 4 times per year    Active Member of [employer] or Organizations: No    Attends Engineer, structural: Not on file    Marital Status: Married    Family History  Problem Relation Age of Onset   Hypertension Mother    Diabetes Mother    Heart disease Mother    Hyperlipidemia Mother    Cancer Father        lung    Outpatient Encounter Medications as of 06/02/2023  Medication Sig   amLODipine (NORVASC) 5 MG tablet Take 1 tablet (5 mg total) by mouth daily.   aspirin EC 81 MG tablet Take 1 tablet (81 mg total) by mouth daily with breakfast.   Continuous Glucose Sensor (DEXCOM G7 SENSOR) MISC Inject 1 Application into the skin as  directed. Change sensor every 10 days as directed.   Continuous Glucose Transmitter (DEXCOM G6 TRANSMITTER) MISC Change transmitter every 90 days   ezetimibe (ZETIA) 10 MG tablet Take 1 tablet (10 mg total) by mouth daily.   fenofibrate 160 MG tablet Take 1 tablet (160 mg total) by mouth daily.   glipiZIDE (GLUCOTROL XL) 5 MG 24 hr tablet Take 1 tablet (5 mg total) by mouth daily with breakfast.   glucose blood (CONTOUR NEXT TEST) test strip Use as instructed to monitor glucose 4 times daily   indapamide (LOZOL) 2.5 MG tablet TAKE (1) TABLET BY MOUTH ONCE DAILY.   insulin lispro (HUMALOG) 100 UNIT/ML injection Inject 0.41 mLs (41 Units total) into the skin 3 (three) times daily with meals.   Insulin Pen Needle (PEN NEEDLES) 31G X 6 MM MISC Use to inject insulin 4 times daily   metoprolol succinate (TOPROL-XL) 100 MG 24 hr tablet TAKE (1) TABLET BY MOUTH ONCE DAILY.   Multiple Vitamin (MULTIVITAMIN) tablet Take 1 tablet by mouth daily.   oxyCODONE-acetaminophen (PERCOCET) 10-325 MG tablet 1 taken 4 times daily as needed for pain   oxyCODONE-acetaminophen (PERCOCET) 10-325 MG tablet 1  taken 4 times daily as needed for pain   rosuvastatin (CRESTOR) 40 MG tablet Take 1 tablet (40 mg total) by mouth daily.   sildenafil (REVATIO) 20 MG tablet Take 1-5 tablets before sexual activity as directed. No greater that 5 tablets in a day.   triamcinolone cream (KENALOG) 0.1 % APPLY TO AFFECTED AREA SPARINGLY THREE TIMES DAILY.   valsartan (DIOVAN) 160 MG tablet Take 1 tablet (160 mg total) by mouth daily.   [DISCONTINUED] insulin glargine, 2 Unit Dial, (TOUJEO MAX SOLOSTAR) 300 UNIT/ML Solostar Pen Inject 120 Units into the skin at bedtime.   insulin glargine, 2 Unit Dial, (TOUJEO MAX SOLOSTAR) 300 UNIT/ML Solostar Pen Inject 140 Units into the skin at bedtime.   oxyCODONE-acetaminophen (PERCOCET) 10-325 MG tablet 1 taken 4 times daily as needed for pain (Patient not taking: Reported on 06/02/2023)   No  facility-administered encounter medications on file as of 06/02/2023.    ALLERGIES: Allergies  Allergen Reactions   Invokana [Canagliflozin] Other (See Comments)    laryngitis   Trulicity [Dulaglutide] Swelling    Patient also did not tolerate because of blistering in the mouth would avoid all GLP-1's patient was hospitalized with diabetic ketoacidosis August 2023   Altace [Ramipril] Cough   Metformin And Related Diarrhea    VACCINATION STATUS: Immunization History  Administered Date(s) Administered   Influenza, Seasonal, Injecte, Preservative Fre 12/20/2022   Influenza,inj,Quad PF,6+ Mos 01/30/2014, 01/29/2016, 01/30/2017, 01/26/2018, 01/23/2019, 01/17/2020, 01/01/2021   Influenza-Unspecified 01/26/2012, 02/21/2015, 01/10/2022   PNEUMOCOCCAL CONJUGATE-20 03/21/2023   Pneumococcal Polysaccharide-23 01/30/2014   Td 12/19/2014    Diabetes He presents for his follow-up diabetic visit. He has type 2 diabetes mellitus. Onset time: Diagnosed at approximate age of 89. His disease course has been fluctuating. There are no hypoglycemic associated symptoms. Pertinent negatives for hypoglycemia include no nervousness/anxiousness. Associated symptoms include fatigue. Pertinent negatives for diabetes include no blurred vision, no polydipsia, no polyphagia, no polyuria and no weight loss. There are no hypoglycemic complications. Nocturnal hypoglycemia: mild. Symptoms are stable. Diabetic complications include nephropathy. Risk factors for coronary artery disease include diabetes mellitus, dyslipidemia, hypertension, male sex, obesity, stress and sedentary lifestyle. Current diabetic treatment includes intensive insulin program and oral agent (monotherapy). He is compliant with treatment most of the time. His weight is increasing steadily. He is following a generally healthy diet. When asked about meal planning, he reported none. He has not had a previous visit with a dietitian. He participates in exercise  intermittently. His home blood glucose trend is fluctuating dramatically. His overall blood glucose range is >200 mg/dl. (He presents today with his CGM and logs showing fluctuating glycemic profile.  His POCT A1c today is 9.8%, increasing from last visit of 8.8%.  Analysis of his CGM shows TIR 22%, TAR 78%, TBR 0% with a GMI of 9.8%.  He has unpredictable insulin sensitivity.  It appears his insulin works sometimes and other times it does not.  He endorses a relatively healthy diet, eating mostly the same things day in and day out.  ) An ACE inhibitor/angiotensin II receptor blocker is being taken. He does not see a podiatrist.Eye exam is not current.  Hypertension This is a chronic problem. The current episode started more than 1 year ago. The problem has been resolved since onset. The problem is controlled. Pertinent negatives include no blurred vision. There are no associated agents to hypertension. Risk factors for coronary artery disease include diabetes mellitus, dyslipidemia, male gender, obesity and stress. Past treatments include angiotensin blockers and calcium channel blockers. The current treatment provides mild improvement. Compliance problems include diet and exercise.  Hypertensive end-organ damage includes kidney disease. Identifiable causes of hypertension include chronic renal disease.  Hyperlipidemia This is a chronic problem. The current episode started more than 1 year ago. The problem is uncontrolled. Recent lipid tests were reviewed and are variable. Exacerbating diseases include chronic renal disease and diabetes. Factors aggravating his hyperlipidemia include fatty foods. Current antihyperlipidemic treatment includes statins and fibric acid derivatives. The current treatment provides moderate improvement of lipids. Compliance problems include adherence to diet, adherence to exercise and psychosocial issues.  Risk factors for coronary artery disease include diabetes mellitus,  dyslipidemia, hypertension, male sex and obesity.    Review of systems  Constitutional: + Minimally fluctuating body weight,  current Body mass index is 29.96 kg/m. , + chronic fatigue, no subjective hyperthermia, no subjective hypothermia Eyes: no blurry vision, no xerophthalmia ENT: no sore throat, no nodules palpated in throat, no dysphagia/odynophagia, no hoarseness Cardiovascular: no chest pain, no shortness of breath, no palpitations, no leg swelling Respiratory: no cough, no shortness of breath Gastrointestinal: no nausea/vomiting/diarrhea Musculoskeletal: no muscle/joint aches Skin: no rashes, no hyperemia Neurological: no tremors, no numbness, no tingling, no dizziness Psychiatric: no depression, no anxiety   Objective:     BP (!) 140/78 (BP  Location: Right Arm, Patient Position: Sitting, Cuff Size: Large)   Pulse 76   Ht 5\' 10"  (1.778 m)   Wt 208 lb 12.8 oz (94.7 kg)   BMI 29.96 kg/m   Wt Readings from Last 3 Encounters:  06/02/23 208 lb 12.8 oz (94.7 kg)  03/21/23 210 lb (95.3 kg)  03/03/23 208 lb (94.3 kg)    BP Readings from Last 3 Encounters:  06/02/23 (!) 140/78  03/21/23 (!) 150/78  03/03/23 138/80     Physical Exam- Limited  Constitutional:  Body mass index is 29.96 kg/m. , not in acute distress, normal state of mind Eyes:  EOMI, no exophthalmos Musculoskeletal: no gross deformities, strength intact in all four extremities, no gross restriction of joint movements Skin:  no rashes, no hyperemia Neurological: no tremor with outstretched hands    Diabetic Foot Exam - Simple   No data filed     CMP ( most recent) CMP     Component Value Date/Time   NA 138 01/27/2023 0851   K 5.1 01/27/2023 0851   CL 97 01/27/2023 0851   CO2 24 01/27/2023 0851   GLUCOSE 316 (H) 01/27/2023 0851   GLUCOSE 264 (H) 11/25/2021 0422   BUN 23 01/27/2023 0851   CREATININE 1.34 (H) 01/27/2023 0851   CREATININE 0.91 02/25/2014 0727   CALCIUM 10.6 (H) 01/27/2023  0851   PROT 7.3 03/17/2022 0917   ALBUMIN 4.6 03/17/2022 0917   AST 20 03/17/2022 0917   ALT 24 03/17/2022 0917   ALKPHOS 56 03/17/2022 0917   BILITOT 0.3 03/17/2022 0917   GFRNONAA 52 (L) 11/25/2021 0422   GFRAA 66 01/17/2020 0928     Diabetic Labs (most recent): Lab Results  Component Value Date   HGBA1C 9.8 (A) 06/02/2023   HGBA1C 8.8 (A) 03/03/2023   HGBA1C 9.5 (A) 11/04/2022   MICROALBUR 150 07/09/2021   MICROALBUR 150 03/09/2020   MICROALBUR 3.36 (H) 05/11/2013     Lipid Panel ( most recent) Lipid Panel     Component Value Date/Time   CHOL 103 11/21/2022 0805   TRIG 174 (H) 11/21/2022 0805   HDL 32 (L) 11/21/2022 0805   CHOLHDL 3.2 11/21/2022 0805   CHOLHDL 5.6 08/06/2021 0806   VLDL 52 (H) 08/06/2021 0806   LDLCALC 42 11/21/2022 0805   LABVLDL 29 11/21/2022 0805      Lab Results  Component Value Date   TSH 0.713 04/30/2021   TSH 1.280 06/29/2020   TSH 1.470 11/11/2019   TSH 2.144 04/04/2019   TSH 0.986 07/01/2015   FREET4 1.39 06/29/2020   FREET4 1.44 11/11/2019           Assessment & Plan:   1) DM type 2 without retinopathy (HCC)  - Terry [name] has currently uncontrolled symptomatic type 2 DM since  63 years of age.  He presents today with his CGM and logs showing fluctuating glycemic profile.  His POCT A1c today is 9.8%, increasing from last visit of 8.8%.  Analysis of his CGM shows TIR 22%, TAR 78%, TBR 0% with a GMI of 9.8%.  He has unpredictable insulin sensitivity.  It appears his insulin works sometimes and other times it does not.  He endorses a relatively healthy diet, eating mostly the same things day in and day out.     Recent labs reviewed.   - I had a long discussion with him about the progressive nature of diabetes and the pathology behind its complications. -his diabetes is complicated by CKD  stage 3a and he remains at a high risk for more acute and chronic complications which include CAD, CVA, CKD, retinopathy, and  neuropathy. These are all discussed in detail with him.  - Nutritional counseling repeated at each appointment due to patients tendency to fall back in to old habits.  - The patient admits there is a room for improvement in their diet and drink choices. -  Suggestion is made for the patient to avoid simple carbohydrates from their diet including Cakes, Sweet Desserts / Pastries, Ice Cream, Soda (diet and regular), Sweet Tea, Candies, Chips, Cookies, Sweet Pastries, Store Bought Juices, Alcohol in Excess of 1-2 drinks a day, Artificial Sweeteners, Coffee Creamer, and "Sugar-free" Products. This will help patient to have stable blood glucose profile and potentially avoid unintended weight gain.   - I encouraged the patient to switch to unprocessed or minimally processed complex starch and increased protein intake (animal or plant source), fruits, and vegetables.   - Patient is advised to stick to a routine mealtimes to eat 3 meals a day and avoid unnecessary snacks (to snack only to correct hypoglycemia).  - I have approached him with the following individualized plan to manage  his diabetes and patient agrees:   -He is advised to increase his Toujeo to 140 units SQ nightly (not seeing optimal coverage at night) and continue his Humalog 35-41 units TID with meals if glucose is above 90 and he is eating (Specific instructions on how to titrate insulin dosage based on glucose readings given to patient in writing).  He can also continue Glipizide 5 mg XL daily with breakfast.    -He is encouraged to continue monitoring glucose 4 times daily (using his CGM), before meals and before bed, and to call the clinic if he has readings less than 70 or greater than 300 for 3 tests in a row.    - he is warned not to take insulin without proper monitoring per orders.  -He is NOT candidate for any GLP1 due to severe allergic reaction to Trulicity.  He did not tolerate Metformin in the past (even in the ER form).   He has been on SGLT2i and did not tolerate them due to chronic laryngitis.  We did also discuss insulin pumps and encouraged him to reach out to his insurance for coverage/price options for Omnipod DASH/5 and or Tandem T-Slim.  Says all of these options are too expensive for his budget right now.  - Specific targets for  A1c;  LDL, HDL,  and Triglycerides were discussed with the patient.  2) Blood Pressure /Hypertension: His blood pressure is controlled to target.  He is advised to continue Diovan 160 mg, Amlodipine 10 mg po daily, Indapamide 2.5 mg po daily, and Metoprolol 100 mg po daily.      3) Lipids/Hyperlipidemia:    His most recent lipid panel from 11/21/22 shows controlled LDL of 42 and elevated triglycerides of 174 (drastically improved).  He is advised to continue Crestor 40 mg po daily at bedtime and Fenofibrate 160 mg po daily.  Side effects and precautions discussed with him.  We discussed the need to avoid fried foods and butter to reduce his risk of cardiovascular events.  4)  Weight/Diet:  His Body mass index is 29.96 kg/m.  -   he is a candidate for weight loss. I discussed with him the fact that loss of 5 - 10% of his  current body weight will have the most impact on his diabetes management.  Exercise, and detailed carbohydrates information provided  -  detailed on discharge instructions.  5) Chronic Care/Health Maintenance: -he on ACEI/ARB and Statin medications and  is encouraged to initiate and continue to follow up with Ophthalmology, Dentist, Podiatrist at least yearly or according to recommendations, and advised to stay away from smoking. I have recommended yearly flu vaccine and pneumonia vaccine at least every 5 years; moderate intensity exercise for up to 150 minutes weekly; and  sleep for at least 7 hours a day.  - he is advised to maintain close follow up with Babs Sciara, MD for primary care needs, as well as his other providers for optimal and coordinated  care.  - I am also checking a battery of labs to help identify potential problems impacting his severe insulin resistance including cortisol, testosterone, vitamin d, and thyroid panel.   I spent  56  minutes in the care of the patient today including review of labs from CMP, Lipids, Thyroid Function, Hematology (current and previous including abstractions from other facilities); face-to-face time discussing  his blood glucose readings/logs, discussing hypoglycemia and hyperglycemia episodes and symptoms, medications doses, his options of short and long term treatment based on the latest standards of care / guidelines;  discussion about incorporating lifestyle medicine;  and documenting the encounter. Risk reduction counseling performed per USPSTF guidelines to reduce obesity and cardiovascular risk factors.     Please refer to Patient Instructions for Blood Glucose Monitoring and Insulin/Medications Dosing Guide"  in media tab for additional information. Please  also refer to " Patient Self Inventory" in the Media  tab for reviewed elements of pertinent patient history.  Terry [name] participated in the discussions, expressed understanding, and voiced agreement with the above plans.  All questions were answered to his satisfaction. he is encouraged to contact clinic should he have any questions or concerns prior to his return visit.   Follow up plan: - Return in about 3 months (around 09/02/2023) for Diabetes F/U with A1c in office, Previsit labs, Bring meter and logs.   Ronny Bacon, [facility] [address] [place], [place] [address] Phone: [phone] Fax: [phone]  06/02/2023, 8:40 AM

## 2023-06-09 DIAGNOSIS — R5382 Chronic fatigue, unspecified: Secondary | ICD-10-CM | POA: Diagnosis not present

## 2023-06-09 DIAGNOSIS — R7989 Other specified abnormal findings of blood chemistry: Secondary | ICD-10-CM | POA: Diagnosis not present

## 2023-06-09 DIAGNOSIS — E559 Vitamin D deficiency, unspecified: Secondary | ICD-10-CM | POA: Diagnosis not present

## 2023-06-12 LAB — COMPREHENSIVE METABOLIC PANEL
ALT: 36 IU/L (ref 0–44)
AST: 42 IU/L — ABNORMAL HIGH (ref 0–40)
Albumin: 4.5 g/dL (ref 3.9–4.9)
Alkaline Phosphatase: 43 IU/L — ABNORMAL LOW (ref 44–121)
BUN/Creatinine Ratio: 19 (ref 10–24)
BUN: 32 mg/dL — ABNORMAL HIGH (ref 8–27)
Bilirubin Total: 0.3 mg/dL (ref 0.0–1.2)
CO2: 25 mmol/L (ref 20–29)
Calcium: 9.7 mg/dL (ref 8.6–10.2)
Chloride: 101 mmol/L (ref 96–106)
Creatinine, Ser: 1.68 mg/dL — ABNORMAL HIGH (ref 0.76–1.27)
Globulin, Total: 2.7 g/dL (ref 1.5–4.5)
Glucose: 189 mg/dL — ABNORMAL HIGH (ref 70–99)
Potassium: 4.6 mmol/L (ref 3.5–5.2)
Sodium: 142 mmol/L (ref 134–144)
Total Protein: 7.2 g/dL (ref 6.0–8.5)
eGFR: 46 mL/min/{1.73_m2} — ABNORMAL LOW (ref >59)

## 2023-06-12 LAB — TESTOSTERONE FREE, PROFILE I
Sex Hormone Binding: 28.6 nmol/L (ref 19.3–76.4)
Testost., Free, Calc: 7.8 pg/mL — ABNORMAL LOW (ref 34.7–150.3)
Testosterone: 41 ng/dL — ABNORMAL LOW (ref 264–916)

## 2023-06-12 LAB — T3, FREE: T3, Free: 4 pg/mL (ref 2.0–4.4)

## 2023-06-12 LAB — CORTISOL-AM, BLOOD: Cortisol - AM: 12.8 ug/dL (ref 6.2–19.4)

## 2023-06-12 LAB — VITAMIN D 25 HYDROXY (VIT D DEFICIENCY, FRACTURES): Vit D, 25-Hydroxy: 28.7 ng/mL — ABNORMAL LOW (ref 30.0–100.0)

## 2023-06-12 LAB — T4, FREE: Free T4: 1.48 ng/dL (ref 0.82–1.77)

## 2023-06-12 LAB — TSH: TSH: 1.14 u[IU]/mL (ref 0.450–4.500)

## 2023-06-13 ENCOUNTER — Telehealth: Payer: Self-pay | Admitting: Nurse Practitioner

## 2023-06-13 ENCOUNTER — Encounter: Payer: Self-pay | Admitting: Nurse Practitioner

## 2023-06-13 ENCOUNTER — Other Ambulatory Visit: Payer: Self-pay | Admitting: Nurse Practitioner

## 2023-06-13 DIAGNOSIS — R7989 Other specified abnormal findings of blood chemistry: Secondary | ICD-10-CM

## 2023-06-13 DIAGNOSIS — R5382 Chronic fatigue, unspecified: Secondary | ICD-10-CM

## 2023-06-13 NOTE — Telephone Encounter (Signed)
 No he wont need to repeat them.

## 2023-06-13 NOTE — Progress Notes (Signed)
 Can you look and see if there is any contraindication to start testosterone therapy on this patient?  His energy level is super low, diabetes is uncontrolled (despite high doses insulin) which I'm sure they both play a role in each other.

## 2023-06-13 NOTE — Telephone Encounter (Signed)
 Pt did labs after his visit on 3/7. Did you want him to do that or does he need to repeat again in June

## 2023-06-28 ENCOUNTER — Ambulatory Visit (INDEPENDENT_AMBULATORY_CARE_PROVIDER_SITE_OTHER): Payer: BC Managed Care – PPO | Admitting: Family Medicine

## 2023-06-28 VITALS — BP 148/64 | HR 76 | Temp 98.1°F | Ht 70.0 in | Wt 211.0 lb

## 2023-06-28 DIAGNOSIS — E1129 Type 2 diabetes mellitus with other diabetic kidney complication: Secondary | ICD-10-CM | POA: Diagnosis not present

## 2023-06-28 DIAGNOSIS — M1990 Unspecified osteoarthritis, unspecified site: Secondary | ICD-10-CM

## 2023-06-28 DIAGNOSIS — Z125 Encounter for screening for malignant neoplasm of prostate: Secondary | ICD-10-CM | POA: Diagnosis not present

## 2023-06-28 DIAGNOSIS — Z79891 Long term (current) use of opiate analgesic: Secondary | ICD-10-CM

## 2023-06-28 DIAGNOSIS — I10 Essential (primary) hypertension: Secondary | ICD-10-CM

## 2023-06-28 DIAGNOSIS — R809 Proteinuria, unspecified: Secondary | ICD-10-CM

## 2023-06-28 MED ORDER — METOPROLOL SUCCINATE ER 100 MG PO TB24: ORAL_TABLET | ORAL | 3 refills | Status: DC

## 2023-06-28 MED ORDER — EZETIMIBE 10 MG PO TABS: 10.0000 mg | ORAL_TABLET | Freq: Every day | ORAL | 3 refills | Status: DC

## 2023-06-28 MED ORDER — OXYCODONE-ACETAMINOPHEN 10-325 MG PO TABS: ORAL_TABLET | ORAL | 0 refills | Status: DC

## 2023-06-28 MED ORDER — OXYCODONE-ACETAMINOPHEN 10-325 MG PO TABS
ORAL_TABLET | ORAL | 0 refills | Status: DC
Start: 2023-06-28 — End: 2023-10-06

## 2023-06-28 MED ORDER — INDAPAMIDE 2.5 MG PO TABS: ORAL_TABLET | ORAL | 3 refills | Status: DC

## 2023-06-28 MED ORDER — AMLODIPINE BESYLATE 5 MG PO TABS: 5.0000 mg | ORAL_TABLET | Freq: Every day | ORAL | 3 refills | Status: DC

## 2023-06-28 NOTE — Progress Notes (Signed)
 Subjective:    Patient ID: Terry [name], male    DOB: 1960/07/22, 63 y.o.   MRN: [id number]  HPI  3 month recheck on diabetes and pain management refills needed This patient was seen today for chronic pain  The medication list was reviewed and updated.   Location of Pain for which the patient has been treated with regarding narcotics: Bilateral hand pain with significant osteoarthritis of both hands.  Also chronic left ankle pain  Onset of this pain: Present for years   -Compliance with medication: Good compliance  - Number patient states they take daily: 4 tablets daily  -Reason for ongoing use of opioids NSAIDs, Tylenol did not help enough  What other measures have been tried outside of opioids NSAIDs, Tylenol  In the ongoing specialists regarding this condition none currently  -when was the last dose patient took?  Earlier today  The patient was advised the importance of maintaining medication and not using illegal substances with these.  Here for refills and follow up  The patient was educated that we can provide 3 monthly scripts for their medication, it is their responsibility to follow the instructions.  Side effects or complications from medications: Denies side effects  Patient is aware that pain medications are meant to minimize the severity of the pain to allow their pain levels to improve to allow for better function. They are aware of that pain medications cannot totally remove their pain.  Due for UDT ( at least once per year) (pain management contract is also completed at the time of the UDT): Will will be due on the next visit  Scale of 1 to 10 ( 1 is least 10 is most) Your pain level without the medicine: 10 Your pain level with medication 5  Scale 1 to 10 ( 1-helps very little, 10 helps very well) How well does your pain medication reduce your pain so you can function better through out the day?  5  Quality of the pain: Throbbing  aching  Persistence of the pain: Present all the time  Modifying factors: Worse with activity  Discussed the use of AI scribe software for clinical note transcription with the patient, who gave verbal consent to proceed.  History of Present Illness   Terry [name] "Terry [name]" is a 63 year old male with diabetes and hypertension who presents for medication management and follow-up.  Blood sugar levels have been fluctuating, previously ranging from 300 to 400 mg/dL, but have recently improved to around 200 mg/dL. Adjustments have been made to both fast-acting and long-acting insulin, with an increase of 20 units in both. He is hopeful to see further improvements in his blood sugar levels over the next few months.  Blood pressure readings at home are typically 138-139/80-90 mmHg, which are better than those recorded in medical settings. He checks his blood pressure regularly at home.  He is currently on oxycodone for pain management, taking four 10 mg tablets daily. He experiences night sweats when he does not take the medication before bed and is considering reducing his dosage gradually.  He has arthritis in his hands, with some days allowing him to make a fist, while other days he cannot lay his hand flat. The right hand is worse than the left.  He is taking his cholesterol and blood pressure medications regularly, including amlodipine and indapamide. He experienced some difficulty with refills but discovered he had a supply at home. He visits the pharmacy frequently due to staggered  refill schedules.  There has been a delay in scheduling regular eye exams due to provider changes.         Review of Systems     Objective:   Physical Exam General-in no acute distress Eyes-no discharge Lungs-respiratory rate normal, CTA CV-no murmurs,RRR Extremities skin warm dry no edema Neuro grossly normal Behavior normal, alert Severe osteoarthritis noted of both hands worse on the  right       Assessment & Plan:  Assessment and Plan    Diabetes Mellitus Blood glucose levels improved to 200 mg/dL. Target is 120-140 mg/dL. Endocrinologist consultation advised if no improvement in 2-3 weeks. - Monitor blood glucose levels closely. - Contact endocrinologist if no significant improvement in blood glucose levels in 2-3 weeks.  Hypertension Office BP elevated at 148/64 mmHg; home readings better at 138-139/80-90 mmHg. Goal is home readings in the 130s or lower. - Continue amlodipine and indapamide. - Monitor BP at home and report readings in a few weeks.  Chronic Pain Management Oxycodone 10 mg, 4 times daily, exceeds prescribing limits. Plan to taper to 3 tablets daily to comply with regulations. Gradual reduction advised to avoid withdrawal. New guidelines require staying below 50 MME. - Maintain current oxycodone dosage for the next month, then reduce to 3.5 tablets per day. - Gradually reduce to 3 tablets per day over the next few months. - Consider breaking tablets into halves to spread dosage throughout the day.  Osteoarthritis Osteoarthritis in hands, more restrictive in the right hand.  General Health Maintenance Routine eye exam overdue. Regular medication refills and adherence essential. - Schedule a routine eye exam. - Ensure regular medication refills and adherence.  Follow-up Follow-up includes monitoring of BP and blood glucose levels, and routine lab work. - Perform blood work in 3 months to check PSA, cholesterol, and kidney function. - Follow up with primary care in 3 months.      The patient was seen in followup for chronic pain. A review over at their current pain status was discussed. Drug registry was checked. Prescriptions were given.  Regular follow-up recommended. Discussion was held regarding the importance of compliance with medication as well as pain medication contract.  Patient was informed that medication may cause drowsiness  and should not be combined  with other medications/alcohol or street drugs. If the patient feels medication is causing altered alertness then do not drive or operate dangerous equipment.  Should be noted that the patient appears to be meeting appropriate use of opioids and response.  Evidenced by improved function and decent pain control without significant side effects and no evidence of overt aberrancy issues.  Upon discussion with the patient today they understand that opioid therapy is optional and they feel that the pain has been refractory to reasonable conservative measures and is significant and affecting quality of life enough to warrant ongoing therapy and wishes to continue opioids.  Refills were provided.  [facility] medical Board guidelines regarding the pain medicine has been reviewed.  CDC guidelines most updated 2022 has been reviewed by the prescriber.  PDMP is checked on a regular basis yearly urine drug screen and pain management contract  Treatment plan for this patient includes #1-gentle stretching exercises as shown daily basis 2.  Mild strength exercises 3 times per week #3 continue pain medications #4 notify us if any digression I had a long discussion with patient regarding his safety of opioids. Our current guidelines are moving toward 50 MME or less Based on all this  we will begin gradually tapering his prescriptions when I have talked with him regarding strategies on how to taper should he have any trouble he will let us know  Patient does have hyperlipidemia continue medication check lab work before next visit Check PSA before next visit History of diabetes and hypertension Blood pressure readings at home are good but at the office are elevated He will check lab work before next visit

## 2023-09-08 DIAGNOSIS — R809 Proteinuria, unspecified: Secondary | ICD-10-CM | POA: Diagnosis not present

## 2023-09-08 DIAGNOSIS — E1129 Type 2 diabetes mellitus with other diabetic kidney complication: Secondary | ICD-10-CM | POA: Diagnosis not present

## 2023-09-08 DIAGNOSIS — Z125 Encounter for screening for malignant neoplasm of prostate: Secondary | ICD-10-CM | POA: Diagnosis not present

## 2023-09-08 DIAGNOSIS — R7989 Other specified abnormal findings of blood chemistry: Secondary | ICD-10-CM | POA: Diagnosis not present

## 2023-09-08 DIAGNOSIS — R5382 Chronic fatigue, unspecified: Secondary | ICD-10-CM | POA: Diagnosis not present

## 2023-09-08 DIAGNOSIS — I10 Essential (primary) hypertension: Secondary | ICD-10-CM | POA: Diagnosis not present

## 2023-09-09 LAB — TESTOSTERONE FREE, PROFILE I
Albumin: 4.8 g/dL (ref 3.9–4.9)
Sex Hormone Binding: 36.9 nmol/L (ref 19.3–76.4)
Testost., Free, Calc: 12.4 pg/mL — ABNORMAL LOW (ref 34.7–150.3)
Testosterone: 76 ng/dL — ABNORMAL LOW (ref 264–916)

## 2023-09-10 LAB — MICROALBUMIN / CREATININE URINE RATIO
Creatinine, Urine: 173.7 mg/dL
Microalb/Creat Ratio: 188 mg/g{creat} — ABNORMAL HIGH (ref 0–29)
Microalbumin, Urine: 326.8 ug/mL

## 2023-09-10 LAB — LIPID PANEL
Chol/HDL Ratio: 3 ratio (ref 0.0–5.0)
Cholesterol, Total: 105 mg/dL (ref 100–199)
HDL: 35 mg/dL — ABNORMAL LOW (ref >39)
LDL Chol Calc (NIH): 48 mg/dL (ref 0–99)
Triglycerides: 119 mg/dL (ref 0–149)
VLDL Cholesterol Cal: 22 mg/dL (ref 5–40)

## 2023-09-10 LAB — BASIC METABOLIC PANEL WITH GFR
BUN/Creatinine Ratio: 15 (ref 10–24)
BUN: 24 mg/dL (ref 8–27)
CO2: 22 mmol/L (ref 20–29)
Calcium: 10.2 mg/dL (ref 8.6–10.2)
Chloride: 99 mmol/L (ref 96–106)
Creatinine, Ser: 1.57 mg/dL — ABNORMAL HIGH (ref 0.76–1.27)
Glucose: 240 mg/dL — ABNORMAL HIGH (ref 70–99)
Potassium: 4.8 mmol/L (ref 3.5–5.2)
Sodium: 138 mmol/L (ref 134–144)
eGFR: 50 mL/min/{1.73_m2} — ABNORMAL LOW (ref >59)

## 2023-09-10 LAB — PSA: Prostate Specific Ag, Serum: 0.1 ng/mL (ref 0.0–4.0)

## 2023-09-11 ENCOUNTER — Ambulatory Visit: Payer: Self-pay | Admitting: Family Medicine

## 2023-09-15 ENCOUNTER — Ambulatory Visit (INDEPENDENT_AMBULATORY_CARE_PROVIDER_SITE_OTHER): Admitting: Nurse Practitioner

## 2023-09-15 ENCOUNTER — Encounter: Payer: Self-pay | Admitting: Nurse Practitioner

## 2023-09-15 VITALS — BP 144/82 | HR 89 | Ht 70.0 in | Wt 203.0 lb

## 2023-09-15 DIAGNOSIS — R7989 Other specified abnormal findings of blood chemistry: Secondary | ICD-10-CM

## 2023-09-15 DIAGNOSIS — E782 Mixed hyperlipidemia: Secondary | ICD-10-CM | POA: Diagnosis not present

## 2023-09-15 DIAGNOSIS — E1122 Type 2 diabetes mellitus with diabetic chronic kidney disease: Secondary | ICD-10-CM | POA: Diagnosis not present

## 2023-09-15 DIAGNOSIS — R5382 Chronic fatigue, unspecified: Secondary | ICD-10-CM

## 2023-09-15 DIAGNOSIS — E559 Vitamin D deficiency, unspecified: Secondary | ICD-10-CM

## 2023-09-15 DIAGNOSIS — I10 Essential (primary) hypertension: Secondary | ICD-10-CM

## 2023-09-15 DIAGNOSIS — Z794 Long term (current) use of insulin: Secondary | ICD-10-CM | POA: Diagnosis not present

## 2023-09-15 DIAGNOSIS — N1831 Chronic kidney disease, stage 3a: Secondary | ICD-10-CM

## 2023-09-15 DIAGNOSIS — Z7984 Long term (current) use of oral hypoglycemic drugs: Secondary | ICD-10-CM

## 2023-09-15 LAB — POCT GLYCOSYLATED HEMOGLOBIN (HGB A1C): Hemoglobin A1C: 9.7 % — AB (ref 4.0–5.6)

## 2023-09-15 NOTE — Addendum Note (Signed)
 Addended by: Hampton Levins on: 09/15/2023 09:41 AM   Modules accepted: Orders

## 2023-09-15 NOTE — Progress Notes (Addendum)
 Endocrinology Follow Up Visit      09/15/2023, 8:58 AM   Subjective:    Patient ID: Terry [name], male    DOB: 09/10/1960.  Terry [name] is being seen in follow up after being seen in consultation for management of currently uncontrolled symptomatic diabetes requested by  Bennet Brasil, MD.   Past Medical History:  Diagnosis Date   Diabetes mellitus without complication (HCC) 2004   GERD (gastroesophageal reflux disease)    Hyperlipidemia    Hypertension    Osteoarthritis     Past Surgical History:  Procedure Laterality Date   ANKLE SURGERY Left 03/28/2005   2 plates/screws   COLONOSCOPY N/A 08/04/2017   Procedure: COLONOSCOPY;  Surgeon: Ruby Corporal, MD;  [facility]: [facility];  Service: Endoscopy;  Laterality: N/A;   ESOPHAGEAL DILATION N/A 08/04/2017   Procedure: ESOPHAGEAL DILATION;  Surgeon: Ruby Corporal, MD;  Location: [facility];  Service: Endoscopy;  Laterality: N/A;   ESOPHAGOGASTRODUODENOSCOPY N/A 08/04/2017   Procedure: ESOPHAGOGASTRODUODENOSCOPY (EGD);  Surgeon: Ruby Corporal, MD;  Location: [facility];  Service: Endoscopy;  Laterality: N/A;   POLYPECTOMY  08/04/2017   Procedure: POLYPECTOMY;  Surgeon: Ruby Corporal, MD;  Location: [facility];  Service: Endoscopy;;   ROOT CANAL      Social History   Socioeconomic History   Marital status: Married    Spouse name: Not on file   Number of children: Not on file   Years of education: Not on file   Highest education level: 10th grade  Occupational History   Not on file  Tobacco Use   Smoking status: Former    Current packs/day: 0.00    Types: Cigarettes    Quit date: 06/15/1992    Years since quitting: 31.2   Smokeless tobacco: Never  Vaping Use   Vaping status: Never Used  Substance and Sexual Activity   Alcohol use: Never   Drug use: Not Currently    Types: Marijuana    Comment: last used 1970    Sexual activity: Yes  Other Topics Concern   Not on file  Social History Narrative   Not on file   Social Drivers of Health   Financial Resource Strain: Medium Risk (03/17/2023)   Overall Financial Resource Strain (CARDIA)    Difficulty of Paying Living Expenses: Somewhat hard  Food Insecurity: Food Insecurity Present (03/17/2023)   Hunger Vital Sign    Worried About Running Out of Food in the Last Year: Sometimes true    Ran Out of Food in the Last Year: Never true  Transportation Needs: No Transportation Needs (03/17/2023)   PRAPARE - Administrator, Civil Service (Medical): No    Lack of Transportation (Non-Medical): No  Physical Activity: Sufficiently Active (03/17/2023)   Exercise Vital Sign    Days of Exercise per Week: 5 days    Minutes of Exercise per Session: 150+ min  Stress: No Stress Concern Present (03/17/2023)   [employer] of Occupational Health - Occupational Stress Questionnaire    Feeling of Stress : Only a little  Social Connections: Moderately Integrated (03/17/2023)   Social Connection and Isolation Panel  Frequency of Communication with Friends and Family: More than three times a week    Frequency of Social Gatherings with Friends and Family: Once a week    Attends Religious Services: 1 to 4 times per year    Active Member of [employer] or Organizations: No    Attends Engineer, structural: Not on file    Marital Status: Married    Family History  Problem Relation Age of Onset   Hypertension Mother    Diabetes Mother    Heart disease Mother    Hyperlipidemia Mother    Cancer Father        lung    Outpatient Encounter Medications as of 09/15/2023  Medication Sig   amLODipine  (NORVASC ) 5 MG tablet Take 1 tablet (5 mg total) by mouth daily.   aspirin  EC 81 MG tablet Take 1 tablet (81 mg total) by mouth daily with breakfast.   Continuous Glucose Sensor (DEXCOM G7 SENSOR) MISC Inject 1 Application into the skin as directed.  Change sensor every 10 days as directed.   Continuous Glucose Transmitter (DEXCOM G6 TRANSMITTER) MISC Change transmitter every 90 days   ezetimibe  (ZETIA ) 10 MG tablet Take 1 tablet (10 mg total) by mouth daily.   fenofibrate  160 MG tablet Take 1 tablet (160 mg total) by mouth daily.   glipiZIDE  (GLUCOTROL  XL) 5 MG 24 hr tablet Take 1 tablet (5 mg total) by mouth daily with breakfast.   glucose blood (CONTOUR NEXT TEST) test strip Use as instructed to monitor glucose 4 times daily   indapamide  (LOZOL ) 2.5 MG tablet TAKE (1) TABLET BY MOUTH ONCE DAILY.   insulin  glargine, 2 Unit Dial, (TOUJEO  MAX SOLOSTAR) 300 UNIT/ML Solostar Pen Inject 140 Units into the skin at bedtime.   insulin  lispro (HUMALOG ) 100 UNIT/ML injection Inject 0.41 mLs (41 Units total) into the skin 3 (three) times daily with meals.   Insulin  Pen Needle (PEN NEEDLES) 31G X 6 MM MISC Use to inject insulin  4 times daily   metoprolol  succinate (TOPROL -XL) 100 MG 24 hr tablet TAKE (1) TABLET BY MOUTH ONCE DAILY.   Multiple Vitamin (MULTIVITAMIN) tablet Take 1 tablet by mouth daily.   oxyCODONE -acetaminophen  (PERCOCET) 10-325 MG tablet 1  taken 4 times daily as needed for pain   oxyCODONE -acetaminophen  (PERCOCET) 10-325 MG tablet 1 taken 3 to 4 times daily as needed for pain   oxyCODONE -acetaminophen  (PERCOCET) 10-325 MG tablet 1 taken 3 to 4 times daily as needed for pain   rosuvastatin  (CRESTOR ) 40 MG tablet Take 1 tablet (40 mg total) by mouth daily.   triamcinolone  cream (KENALOG ) 0.1 % APPLY TO AFFECTED AREA SPARINGLY THREE TIMES DAILY.   valsartan  (DIOVAN ) 160 MG tablet Take 1 tablet (160 mg total) by mouth daily.   No facility-administered encounter medications on file as of 09/15/2023.    ALLERGIES: Allergies  Allergen Reactions   Invokana  Adela.Agee ] Other (See Comments)    laryngitis   Trulicity  [Dulaglutide ] Swelling    Patient also did not tolerate because of blistering in the mouth would avoid all GLP-1's  patient was hospitalized with diabetic ketoacidosis August 2023   Altace [Ramipril] Cough   Metformin  And Related Diarrhea    VACCINATION STATUS: Immunization History  Administered Date(s) Administered   Influenza, Seasonal, Injecte, Preservative Fre 12/20/2022   Influenza,inj,Quad PF,6+ Mos 01/30/2014, 01/29/2016, 01/30/2017, 01/26/2018, 01/23/2019, 01/17/2020, 01/01/2021   Influenza-Unspecified 01/26/2012, 02/21/2015, 01/10/2022   PNEUMOCOCCAL CONJUGATE-20 03/21/2023   Pneumococcal Polysaccharide-23 01/30/2014   Td 12/19/2014    Diabetes  He presents for his follow-up diabetic visit. He has type 2 diabetes mellitus. Onset time: Diagnosed at approximate age of 60. His disease course has been fluctuating. There are no hypoglycemic associated symptoms. Pertinent negatives for hypoglycemia include no nervousness/anxiousness. Associated symptoms include fatigue. Pertinent negatives for diabetes include no blurred vision, no polydipsia, no polyphagia, no polyuria and no weight loss. There are no hypoglycemic complications. Nocturnal hypoglycemia: mild. Symptoms are stable. Diabetic complications include nephropathy. Risk factors for coronary artery disease include diabetes mellitus, dyslipidemia, hypertension, male sex, obesity, stress and sedentary lifestyle. Current diabetic treatment includes intensive insulin  program and oral agent (monotherapy). He is compliant with treatment most of the time. His weight is decreasing steadily. He is following a generally healthy diet. When asked about meal planning, he reported none. He has not had a previous visit with a dietitian. He participates in exercise intermittently. His home blood glucose trend is fluctuating dramatically. His overall blood glucose range is >200 mg/dl. (He presents today with his CGM and logs showing fluctuating glycemic profile however improving in the last few weeks.  His POCT A1c today is 9.7%, essentially unchanged from previous  visit.  Analysis of his CGM shows TIR 25%, TAR 75%, TBR 0% with a GMI of 9.2%.  He has unpredictable insulin  sensitivity.  It appears his insulin  works sometimes and other times it does not.  He endorses a relatively healthy diet, but finds himself eating processed foods due to stressful life.  He takes care of his wife.  He recently quit his job. ) An ACE inhibitor/angiotensin II receptor blocker is being taken. He does not see a podiatrist.Eye exam is not current.  Hypertension This is a chronic problem. The current episode started more than 1 year ago. The problem has been resolved since onset. The problem is controlled. Pertinent negatives include no blurred vision. There are no associated agents to hypertension. Risk factors for coronary artery disease include diabetes mellitus, dyslipidemia, male gender, obesity and stress. Past treatments include angiotensin blockers and calcium  channel blockers. The current treatment provides mild improvement. Compliance problems include diet and exercise.  Hypertensive end-organ damage includes kidney disease. Identifiable causes of hypertension include chronic renal disease.  Hyperlipidemia This is a chronic problem. The current episode started more than 1 year ago. The problem is uncontrolled. Recent lipid tests were reviewed and are variable. Exacerbating diseases include chronic renal disease and diabetes. Factors aggravating his hyperlipidemia include fatty foods. Current antihyperlipidemic treatment includes statins and fibric acid derivatives. The current treatment provides moderate improvement of lipids. Compliance problems include adherence to diet, adherence to exercise and psychosocial issues.  Risk factors for coronary artery disease include diabetes mellitus, dyslipidemia, hypertension, male sex and obesity.    Review of systems  Constitutional: + decreasing body weight,  current Body mass index is 29.13 kg/m. , + chronic fatigue, no subjective  hyperthermia, no subjective hypothermia Eyes: no blurry vision, no xerophthalmia ENT: no sore throat, no nodules palpated in throat, no dysphagia/odynophagia, no hoarseness Cardiovascular: no chest pain, no shortness of breath, no palpitations, no leg swelling Respiratory: no cough, no shortness of breath Gastrointestinal: no nausea/vomiting/diarrhea Musculoskeletal: no muscle/joint aches Skin: no rashes, no hyperemia Neurological: no tremors, no numbness, no tingling, no dizziness Psychiatric: no depression, no anxiety   Objective:     BP (!) 144/82 (BP Location: Left Arm, Patient Position: Sitting, Cuff Size: Large) Comment: Retake blood pressure with manuel cuff, patient states that he just took his blood pressure medication. Advised patient to monitor  and to follow up with PCP.  Pulse 89   Ht 5' 10 (1.778 m)   Wt 203 lb (92.1 kg)   BMI 29.13 kg/m   Wt Readings from Last 3 Encounters:  09/15/23 203 lb (92.1 kg)  06/28/23 211 lb (95.7 kg)  06/02/23 208 lb 12.8 oz (94.7 kg)    BP Readings from Last 3 Encounters:  09/15/23 (!) 144/82  06/28/23 (!) 148/64  06/02/23 (!) 140/78     Physical Exam- Limited  Constitutional:  Body mass index is 29.13 kg/m. , not in acute distress, normal state of mind Eyes:  EOMI, no exophthalmos Musculoskeletal: no gross deformities, strength intact in all four extremities, no gross restriction of joint movements Skin:  no rashes, no hyperemia Neurological: no tremor with outstretched hands    Diabetic Foot Exam - Simple   No data filed     CMP ( most recent) CMP     Component Value Date/Time   NA 138 09/08/2023 0810   K 4.8 09/08/2023 0810   CL 99 09/08/2023 0810   CO2 22 09/08/2023 0810   GLUCOSE 240 (H) 09/08/2023 0810   GLUCOSE 264 (H) 11/25/2021 0422   BUN 24 09/08/2023 0810   CREATININE 1.57 (H) 09/08/2023 0810   CREATININE 0.91 02/25/2014 0727   CALCIUM  10.2 09/08/2023 0810   PROT 7.2 06/09/2023 0802   ALBUMIN 4.8  09/08/2023 0806   AST 42 (H) 06/09/2023 0802   ALT 36 06/09/2023 0802   ALKPHOS 43 (L) 06/09/2023 0802   BILITOT 0.3 06/09/2023 0802   GFRNONAA 52 (L) 11/25/2021 0422   GFRAA 66 01/17/2020 0928     Diabetic Labs (most recent): Lab Results  Component Value Date   HGBA1C 9.8 (A) 06/02/2023   HGBA1C 8.8 (A) 03/03/2023   HGBA1C 9.5 (A) 11/04/2022   MICROALBUR 150 07/09/2021   MICROALBUR 150 03/09/2020   MICROALBUR 3.36 (H) 05/11/2013     Lipid Panel ( most recent) Lipid Panel     Component Value Date/Time   CHOL 105 09/08/2023 0810   TRIG 119 09/08/2023 0810   HDL 35 (L) 09/08/2023 0810   CHOLHDL 3.0 09/08/2023 0810   CHOLHDL 5.6 08/06/2021 0806   VLDL 52 (H) 08/06/2021 0806   LDLCALC 48 09/08/2023 0810   LABVLDL 22 09/08/2023 0810      Lab Results  Component Value Date   TSH 1.140 06/09/2023   TSH 0.713 04/30/2021   TSH 1.280 06/29/2020   TSH 1.470 11/11/2019   TSH 2.144 04/04/2019   TSH 0.986 07/01/2015   FREET4 1.48 06/09/2023   FREET4 1.39 06/29/2020   FREET4 1.44 11/11/2019           Assessment & Plan:   1) DM type 2 without retinopathy (HCC)  - Terry [name] has currently uncontrolled symptomatic type 2 DM since  63 years of age.  He presents today with his CGM and logs showing fluctuating glycemic profile however improving in the last few weeks.  His POCT A1c today is 9.7%, essentially unchanged from previous visit.  Analysis of his CGM shows TIR 25%, TAR 75%, TBR 0% with a GMI of 9.2%.  He has unpredictable insulin  sensitivity.  It appears his insulin  works sometimes and other times it does not.  He endorses a relatively healthy diet, but finds himself eating processed foods due to stressful life.  He takes care of his wife.  He recently quit his job.    Recent labs reviewed.   - I had  a long discussion with him about the progressive nature of diabetes and the pathology behind its complications. -his diabetes is complicated by CKD stage 3a and  he remains at a high risk for more acute and chronic complications which include CAD, CVA, CKD, retinopathy, and neuropathy. These are all discussed in detail with him.  - Nutritional counseling repeated at each appointment due to patients tendency to fall back in to old habits.  - The patient admits there is a room for improvement in their diet and drink choices. -  Suggestion is made for the patient to avoid simple carbohydrates from their diet including Cakes, Sweet Desserts / Pastries, Ice Cream, Soda (diet and regular), Sweet Tea, Candies, Chips, Cookies, Sweet Pastries, Store Bought Juices, Alcohol in Excess of 1-2 drinks a day, Artificial Sweeteners, Coffee Creamer, and Sugar-free Products. This will help patient to have stable blood glucose profile and potentially avoid unintended weight gain.   - I encouraged the patient to switch to unprocessed or minimally processed complex starch and increased protein intake (animal or plant source), fruits, and vegetables.   - Patient is advised to stick to a routine mealtimes to eat 3 meals a day and avoid unnecessary snacks (to snack only to correct hypoglycemia).  - I have approached him with the following individualized plan to manage  his diabetes and patient agrees:   -He is advised to lower Toujeo  to 130 units SQ nightly (has been waking up with hypoglycemia last few days) and continue his Humalog  35-41 units TID with meals if glucose is above 90 and he is eating (Specific instructions on how to titrate insulin  dosage based on glucose readings given to patient in writing).  He can also continue Glipizide  5 mg XL daily with breakfast.    -He is encouraged to continue monitoring glucose 4 times daily (using his CGM), before meals and before bed, and to call the clinic if he has readings less than 70 or greater than 300 for 3 tests in a row.    - he is warned not to take insulin  without proper monitoring per orders.  -He is NOT candidate for any  GLP1 due to severe allergic reaction to Trulicity .  He did not tolerate Metformin  in the past (even in the ER form).  He has been on SGLT2i and did not tolerate them due to chronic laryngitis.  We did also discuss insulin  pumps and encouraged him to reach out to his insurance for coverage/price options for Omnipod DASH/5 and or Tandem T-Slim.  Says all of these options are too expensive for his budget right now.  - Specific targets for  A1c;  LDL, HDL,  and Triglycerides were discussed with the patient.  2) Blood Pressure /Hypertension: His blood pressure is not controlled to target but he had not long taken his medication prior to the appointment.  He is advised to continue Diovan  160 mg, Amlodipine  10 mg po daily, Indapamide  2.5 mg po daily, and Metoprolol  100 mg po daily.      3) Lipids/Hyperlipidemia:    His most recent lipid panel from 09/08/23 shows controlled LDL of 48.  He is advised to continue Crestor  40 mg po daily at bedtime and Fenofibrate  160 mg po daily.  Side effects and precautions discussed with him.  We discussed the need to avoid fried foods and butter to reduce his risk of cardiovascular events.  4)  Weight/Diet:  His Body mass index is 29.13 kg/m.  -   he is a  candidate for weight loss. I discussed with him the fact that loss of 5 - 10% of his  current body weight will have the most impact on his diabetes management.  Exercise, and detailed carbohydrates information provided  -  detailed on discharge instructions.  5) Chronic Care/Health Maintenance: -he on ACEI/ARB and Statin medications and  is encouraged to initiate and continue to follow up with Ophthalmology, Dentist, Podiatrist at least yearly or according to recommendations, and advised to stay away from smoking. I have recommended yearly flu vaccine and pneumonia vaccine at least every 5 years; moderate intensity exercise for up to 150 minutes weekly; and  sleep for at least 7 hours a day.  6) Low Testosterone  He has  had 2 consecutive low testosterone  levels.  I did speak with Dr. Monte Antonio regarding his care, and recommend he follow up with him regarding this for evaluation and treatment options.  - he is advised to maintain close follow up with Bennet Brasil, MD for primary care needs, as well as his other providers for optimal and coordinated care.      I spent  24  minutes in the care of the patient today including review of labs from CMP, Lipids, Thyroid  Function, Hematology (current and previous including abstractions from other facilities); face-to-face time discussing  his blood glucose readings/logs, discussing hypoglycemia and hyperglycemia episodes and symptoms, medications doses, his options of short and long term treatment based on the latest standards of care / guidelines;  discussion about incorporating lifestyle medicine;  and documenting the encounter. Risk reduction counseling performed per USPSTF guidelines to reduce obesity and cardiovascular risk factors.     Please refer to Patient Instructions for Blood Glucose Monitoring and Insulin /Medications Dosing Guide  in media tab for additional information. Please  also refer to  Patient Self Inventory in the Media  tab for reviewed elements of pertinent patient history.  Terry [name] participated in the discussions, expressed understanding, and voiced agreement with the above plans.  All questions were answered to his satisfaction. he is encouraged to contact clinic should he have any questions or concerns prior to his return visit.   Follow up plan: - Return in about 3 months (around 12/16/2023) for Diabetes F/U with A1c in office, Bring meter and logs.   Hulon Magic, [facility] [address] [place], [place] [address] Phone: [phone] Fax: [phone]  09/15/2023, 8:58 AM

## 2023-09-29 ENCOUNTER — Other Ambulatory Visit: Payer: Self-pay | Admitting: Family Medicine

## 2023-09-29 DIAGNOSIS — Z79891 Long term (current) use of opiate analgesic: Secondary | ICD-10-CM

## 2023-10-02 ENCOUNTER — Other Ambulatory Visit: Payer: Self-pay | Admitting: Family Medicine

## 2023-10-02 DIAGNOSIS — Z79891 Long term (current) use of opiate analgesic: Secondary | ICD-10-CM

## 2023-10-02 MED ORDER — OXYCODONE-ACETAMINOPHEN 10-325 MG PO TABS: ORAL_TABLET | ORAL | 0 refills | Status: DC

## 2023-10-06 ENCOUNTER — Ambulatory Visit (INDEPENDENT_AMBULATORY_CARE_PROVIDER_SITE_OTHER): Admitting: Family Medicine

## 2023-10-06 VITALS — BP 138/62 | HR 90 | Temp 99.0°F | Ht 70.0 in | Wt 211.4 lb

## 2023-10-06 DIAGNOSIS — R809 Proteinuria, unspecified: Secondary | ICD-10-CM | POA: Diagnosis not present

## 2023-10-06 DIAGNOSIS — Z79891 Long term (current) use of opiate analgesic: Secondary | ICD-10-CM | POA: Diagnosis not present

## 2023-10-06 DIAGNOSIS — I10 Essential (primary) hypertension: Secondary | ICD-10-CM

## 2023-10-06 DIAGNOSIS — E1129 Type 2 diabetes mellitus with other diabetic kidney complication: Secondary | ICD-10-CM | POA: Diagnosis not present

## 2023-10-06 MED ORDER — OXYCODONE-ACETAMINOPHEN 10-325 MG PO TABS
ORAL_TABLET | ORAL | 0 refills | Status: DC
Start: 2023-10-06 — End: 2023-12-25

## 2023-10-06 MED ORDER — OXYCODONE-ACETAMINOPHEN 10-325 MG PO TABS: ORAL_TABLET | ORAL | 0 refills | Status: DC

## 2023-10-06 NOTE — Progress Notes (Signed)
 Subjective:    Patient ID: Terry [name], male    DOB: 27-Feb-1961, 63 y.o.   MRN: [id number]  HPI Patient does have chronic pain.  Patient does relate compliance with taking the medication as directed.  Patient denies negative side effects.  Patient states pain medicine does help with function.  Drug registry was checked.  Patient understands the importance of never driving if feeling sedated.  Patient understands the importance of notifying us  if any problems with pain medicine.  Patient denies medication being used by anyone else.  Patient relates medication is kept in a safe spot.   Discussed the use of AI scribe software for clinical note transcription with the patient, who gave verbal consent to proceed.  History of Present Illness   The patient presents with concerns about diabetes management and pain medication use.  His glucose levels have been high, often over 400 mg/dL, despite using both long-acting and short-acting insulin . He is currently on 140 units of long-acting insulin  per day. His A1c levels remain elevated, and he uses a continuous glucose monitor. He notes a possible correlation between his pain medication use and glucose levels, observing that when he takes less pain medication, his glucose levels are higher.  He is concerned about proteinuria and elevated creatinine levels, which have been noted in recent lab tests. He is taking valsartan .  He has experienced major issues with medications like Invokana , Farxiga, and Jardiance in the past, and is currently not using these medications.  He manages chronic pain with oxycodone . He experiences night sweats and sinus issues when not taking his pain medication. He is considering switching his prescriptions to a different pharmacy due to previous negative experiences.  He mentions having a slight heart murmur and recalls being told about it in the past. His last echocardiogram was in 2021.  He experiences swelling in his  ankles, particularly in the evenings, but not consistently throughout the day. No persistent swelling in his ankles, noting it occurs mainly in the evenings.  He is retired, which allows him to be more structured with his diet and lifestyle choices.      This patient was seen today for chronic pain  The medication list was reviewed and updated.   Location of Pain for which the patient has been treated with regarding narcotics: Lumbar pain, bilateral hand arthritis, knee arthritis  Onset of this pain: Present for years   -Compliance with medication: Good compliance  - Number patient states they take daily: We are tapering down to 100 tablets/month  -Reason for ongoing use of opioids cannot get adequate response with Tylenol  alone because of his underlying diseases cannot be on NSAIDs  What other measures have been tried outside of opioids NSAIDs, Tylenol , seen specialist  In the ongoing specialists regarding this condition none currently  -when was the last dose patient took?  Within the past 24 hours  The patient was advised the importance of maintaining medication and not using illegal substances with these.  Here for refills and follow up  The patient was educated that we can provide 3 monthly scripts for their medication, it is their responsibility to follow the instructions.  Side effects or complications from medications: No side effects  Patient is aware that pain medications are meant to minimize the severity of the pain to allow their pain levels to improve to allow for better function. They are aware of that pain medications cannot totally remove their pain.  Due for UDT ( at  least once per year) (pain management contract is also completed at the time of the UDT): Today  Scale of 1 to 10 ( 1 is least 10 is most) Your pain level without the medicine: 9 Your pain level with medication 3  Scale 1 to 10 ( 1-helps very little, 10 helps very well) How well does your pain  medication reduce your pain so you can function better through out the day?9   Quality of the pain: Throbbing aching  Persistence of the pain: Present all time  Modifying factors: Worse with activity      Review of Systems     Objective:   Physical Exam General-in no acute distress Eyes-no discharge Lungs-respiratory rate normal, CTA CV-no murmurs,RRR Extremities skin warm dry no edema Neuro grossly normal Behavior normal, alert        Assessment & Plan:   Assessment and Plan     Diabetes Mellitus A1c levels elevated, fasting glucose over 400 mg/dL. Concerns about renal impact due to proteinuria and elevated creatinine. Emphasized achieving A1c closer to 7. - Continue current insulin  regimen. - Regularly monitor blood glucose levels. - Consult endocrinologist for potential treatment adjustments if hyperglycemia persists. - - Encourage structured eating habits and regular glucose monitoring.  Chronic Kidney Disease Proteinuria and elevated creatinine suggest renal involvement, likely secondary to diabetes. Valsartan  beneficial for renal protection. - Continue Valsartan  for renal protection. - Regularly monitor renal function.  Chronic Pain Management On oxycodone  for chronic pain. Need to balance analgesia with risk of dependency. Discussed potential referral to pain management specialist if current regimen is insufficient. - Prescribe oxycodone  with a plan for three prescriptions to cover until early November. - Consider referral to pain management if current regimen is insufficient. - Discuss gradual tapering plan if discontinuation is desired.  Heart Murmur Known heart murmur with last echocardiogram in 2021. Need to reassess cardiac function to rule out valvular issues. - Order echocardiogram to assess cardiac function.  General Health Maintenance Discussed importance of regular eye exams due to diabetes. Encouraged regular physical activity to aid glycemic  control. - Schedule diabetic eye exam. - Engage in regular physical activity, such as walking 15-20 minutes five days a week. - Contact eye care provider to confirm insurance acceptance and availability for diabetic eye exam.  Follow-up Emphasized importance of seeing improvement in diabetes management within six months. - Follow up in three months to assess improvement in diabetes management. - Ensure follow-up with endocrinologist in a couple of weeks.    1. Encounter for long-term opiate analgesic use (Primary) The patient was seen in followup for chronic pain. A review over at their current pain status was discussed. Drug registry was checked. Prescriptions were given.  Regular follow-up recommended. Discussion was held regarding the importance of compliance with medication as well as pain medication contract.  Patient was informed that medication may cause drowsiness and should not be combined  with other medications/alcohol or street drugs. If the patient feels medication is causing altered alertness then do not drive or operate dangerous equipment.  Should be noted that the patient appears to be meeting appropriate use of opioids and response.  Evidenced by improved function and decent pain control without significant side effects and no evidence of overt aberrancy issues.  Upon discussion with the patient today they understand that opioid therapy is optional and they feel that the pain has been refractory to reasonable conservative measures and is significant and affecting quality of life enough to warrant ongoing therapy  and wishes to continue opioids.  Refills were provided.  [place]  medical Board guidelines regarding the pain medicine has been reviewed.  CDC guidelines most updated 2022 has been reviewed by the prescriber.  PDMP is checked on a regular basis yearly urine drug screen and pain management contract  Treatment plan for this patient includes #1-gentle stretching  exercises as shown daily basis 2.  Mild strength exercises 3 times per week #3 continue pain medications #4 notify us  if any digression We did discuss his pain medicine He seems to be doing well with 3/day but occasional 4/day for now we will hold at 100 tablets/month  - ToxASSURE Select 13 (MW), Urine  2. Essential hypertension, benign Blood pressure decent control  3. Diabetes mellitus with proteinuria (HCC) Diabetes subpar control have strongly encouraged him to work hard on healthier eating and regular physical activity plus also working intensely with endocrinology to get this under better control Reassess how this is going in 3 to 6 months

## 2023-10-07 LAB — MED LIST OPTION NOT SELECTED

## 2023-10-07 LAB — MED LIST ATTACHED SEPARATELY

## 2023-10-10 ENCOUNTER — Ambulatory Visit: Payer: Self-pay | Admitting: Family Medicine

## 2023-10-10 LAB — TOXASSURE SELECT 13 (MW), URINE

## 2023-10-10 LAB — SPECIMEN STATUS REPORT

## 2023-10-11 ENCOUNTER — Other Ambulatory Visit: Payer: Self-pay

## 2023-10-11 DIAGNOSIS — R011 Cardiac murmur, unspecified: Secondary | ICD-10-CM

## 2023-10-17 ENCOUNTER — Encounter: Payer: Self-pay | Admitting: "Endocrinology

## 2023-10-17 ENCOUNTER — Ambulatory Visit (INDEPENDENT_AMBULATORY_CARE_PROVIDER_SITE_OTHER): Admitting: "Endocrinology

## 2023-10-17 VITALS — BP 148/74 | HR 76 | Ht 70.0 in | Wt 210.2 lb

## 2023-10-17 DIAGNOSIS — E6609 Other obesity due to excess calories: Secondary | ICD-10-CM | POA: Diagnosis not present

## 2023-10-17 DIAGNOSIS — Z794 Long term (current) use of insulin: Secondary | ICD-10-CM | POA: Diagnosis not present

## 2023-10-17 DIAGNOSIS — E66811 Obesity, class 1: Secondary | ICD-10-CM | POA: Diagnosis not present

## 2023-10-17 DIAGNOSIS — E291 Testicular hypofunction: Secondary | ICD-10-CM | POA: Insufficient documentation

## 2023-10-17 DIAGNOSIS — N1831 Chronic kidney disease, stage 3a: Secondary | ICD-10-CM

## 2023-10-17 DIAGNOSIS — I10 Essential (primary) hypertension: Secondary | ICD-10-CM | POA: Diagnosis not present

## 2023-10-17 DIAGNOSIS — E1122 Type 2 diabetes mellitus with diabetic chronic kidney disease: Secondary | ICD-10-CM | POA: Diagnosis not present

## 2023-10-17 DIAGNOSIS — Z6830 Body mass index (BMI) 30.0-30.9, adult: Secondary | ICD-10-CM | POA: Diagnosis not present

## 2023-10-17 DIAGNOSIS — E782 Mixed hyperlipidemia: Secondary | ICD-10-CM | POA: Diagnosis not present

## 2023-10-17 NOTE — Progress Notes (Signed)
 Endocrinology Follow Up Visit      10/17/2023, 9:53 AM   Subjective:    Patient ID: Terry [name], male    DOB: October 11, 1960 (age 63).  Terry [name] is being seen in follow up after being seen in consultation for management of currently uncontrolled symptomatic diabetes requested by  Alphonsa Glendia LABOR, MD. This visit is specifically for his hypogonadism.  Past Medical History:  Diagnosis Date   Diabetes mellitus without complication (HCC) 2004   GERD (gastroesophageal reflux disease)    Hyperlipidemia    Hypertension    Osteoarthritis     Past Surgical History:  Procedure Laterality Date   ANKLE SURGERY Left 03/28/2005   2 plates/screws   COLONOSCOPY N/A 08/04/2017   Procedure: COLONOSCOPY;  Surgeon: Golda Claudis PENNER, MD;  Location: [facility];  Service: Endoscopy;  Laterality: N/A;   ESOPHAGEAL DILATION N/A 08/04/2017   Procedure: ESOPHAGEAL DILATION;  Surgeon: Golda Claudis PENNER, MD;  Location: [facility];  Service: Endoscopy;  Laterality: N/A;   ESOPHAGOGASTRODUODENOSCOPY N/A 08/04/2017   Procedure: ESOPHAGOGASTRODUODENOSCOPY (EGD);  Surgeon: Golda Claudis PENNER, MD;  Location: [facility];  Service: Endoscopy;  Laterality: N/A;   POLYPECTOMY  08/04/2017   Procedure: POLYPECTOMY;  Surgeon: Golda Claudis PENNER, MD;  Location: [facility];  Service: Endoscopy;;   ROOT CANAL      Social History   Socioeconomic History   Marital status: Married    Spouse name: Not on file   Number of children: Not on file   Years of education: Not on file   Highest education level: 10th grade  Occupational History   Not on file  Tobacco Use   Smoking status: Former    Current packs/day: 0.00    Types: Cigarettes    Quit date: 06/15/1992    Years since quitting: 31.3   Smokeless tobacco: Never  Vaping Use   Vaping status: Never Used  Substance and Sexual Activity   Alcohol use: Never   Drug use: Not Currently     Types: Marijuana    Comment: last used 1970   Sexual activity: Yes  Other Topics Concern   Not on file  Social History Narrative   Not on file   Social Drivers of Health   Financial Resource Strain: Medium Risk (03/17/2023)   Overall Financial Resource Strain (CARDIA)    Difficulty of Paying Living Expenses: Somewhat hard  Food Insecurity: Food Insecurity Present (03/17/2023)   Hunger Vital Sign    Worried About Running Out of Food in the Last Year: Sometimes true    Ran Out of Food in the Last Year: Never true  Transportation Needs: No Transportation Needs (03/17/2023)   PRAPARE - Administrator, Civil Service (Medical): No    Lack of Transportation (Non-Medical): No  Physical Activity: Sufficiently Active (03/17/2023)   Exercise Vital Sign    Days of Exercise per Week: 5 days    Minutes of Exercise per Session: 150+ min  Stress: No Stress Concern Present (03/17/2023)   [employer] of Occupational Health - Occupational Stress Questionnaire    Feeling of Stress : Only a little  Social Connections: Moderately Integrated (03/17/2023)   Social Connection  and Isolation Panel    Frequency of Communication with Friends and Family: More than three times a week    Frequency of Social Gatherings with Friends and Family: Once a week    Attends Religious Services: 1 to 4 times per year    Active Member of [employer] or Organizations: No    Attends Engineer, structural: Not on file    Marital Status: Married    Family History  Problem Relation Age of Onset   Hypertension Mother    Diabetes Mother    Heart disease Mother    Hyperlipidemia Mother    Cancer Father        lung    Outpatient Encounter Medications as of 10/17/2023  Medication Sig   amLODipine  (NORVASC ) 5 MG tablet Take 1 tablet (5 mg total) by mouth daily.   aspirin  EC 81 MG tablet Take 1 tablet (81 mg total) by mouth daily with breakfast.   Continuous Glucose Sensor (DEXCOM G7 SENSOR) MISC  Inject 1 Application into the skin as directed. Change sensor every 10 days as directed.   Continuous Glucose Transmitter (DEXCOM G6 TRANSMITTER) MISC Change transmitter every 90 days (Patient not taking: Reported on 10/06/2023)   ezetimibe  (ZETIA ) 10 MG tablet Take 1 tablet (10 mg total) by mouth daily.   fenofibrate  160 MG tablet Take 1 tablet (160 mg total) by mouth daily.   glipiZIDE  (GLUCOTROL  XL) 5 MG 24 hr tablet Take 1 tablet (5 mg total) by mouth daily with breakfast.   glucose blood (CONTOUR NEXT TEST) test strip Use as instructed to monitor glucose 4 times daily   indapamide  (LOZOL ) 2.5 MG tablet TAKE (1) TABLET BY MOUTH ONCE DAILY.   insulin  glargine, 2 Unit Dial, (TOUJEO  MAX SOLOSTAR) 300 UNIT/ML Solostar Pen Inject 140 Units into the skin at bedtime.   insulin  lispro (HUMALOG ) 100 UNIT/ML injection Inject 0.41 mLs (41 Units total) into the skin 3 (three) times daily with meals.   Insulin  Pen Needle (PEN NEEDLES) 31G X 6 MM MISC Use to inject insulin  4 times daily   metoprolol  succinate (TOPROL -XL) 100 MG 24 hr tablet TAKE (1) TABLET BY MOUTH ONCE DAILY.   Multiple Vitamin (MULTIVITAMIN) tablet Take 1 tablet by mouth daily.   oxyCODONE -acetaminophen  (PERCOCET) 10-325 MG tablet 1 taken 3 to 4 times daily as needed for pain   oxyCODONE -acetaminophen  (PERCOCET) 10-325 MG tablet 1 taken 3 to 4 times daily as needed for pain   oxyCODONE -acetaminophen  (PERCOCET) 10-325 MG tablet 1  taken 4 times daily as needed for pain   rosuvastatin  (CRESTOR ) 40 MG tablet Take 1 tablet (40 mg total) by mouth daily.   triamcinolone  cream (KENALOG ) 0.1 % APPLY TO AFFECTED AREA SPARINGLY THREE TIMES DAILY.   valsartan  (DIOVAN ) 160 MG tablet Take 1 tablet (160 mg total) by mouth daily.   No facility-administered encounter medications on file as of 10/17/2023.    ALLERGIES: Allergies  Allergen Reactions   Invokana  [Canagliflozin ] Other (See Comments)    laryngitis   Trulicity  [Dulaglutide ] Swelling     Patient also did not tolerate because of blistering in the mouth would avoid all GLP-1's patient was hospitalized with diabetic ketoacidosis August 2023   Altace [Ramipril] Cough   Metformin  And Related Diarrhea    VACCINATION STATUS: Immunization History  Administered Date(s) Administered   Influenza, Seasonal, Injecte, Preservative Fre 12/20/2022   Influenza,inj,Quad PF,6+ Mos 01/30/2014, 01/29/2016, 01/30/2017, 01/26/2018, 01/23/2019, 01/17/2020, 01/01/2021   Influenza-Unspecified 01/26/2012, 02/21/2015, 01/10/2022   PNEUMOCOCCAL CONJUGATE-20 03/21/2023  Pneumococcal Polysaccharide-23 01/30/2014   Td 12/19/2014    Diabetes He presents for his follow-up diabetic visit. He has type 2 diabetes mellitus. Onset time: Diagnosed at approximate age of 5. His disease course has been worsening. There are no hypoglycemic associated symptoms. Pertinent negatives for hypoglycemia include no nervousness/anxiousness. Associated symptoms include fatigue. Pertinent negatives for diabetes include no blurred vision, no polydipsia, no polyphagia, no polyuria and no weight loss. There are no hypoglycemic complications. Nocturnal hypoglycemia: mild. Symptoms are stable. Diabetic complications include nephropathy. Risk factors for coronary artery disease include diabetes mellitus, dyslipidemia, hypertension, male sex, obesity, stress and sedentary lifestyle. Current diabetic treatment includes intensive insulin  program and oral agent (monotherapy). He is compliant with treatment most of the time. His weight is decreasing steadily. He is following a generally healthy diet. When asked about meal planning, he reported none. He has not had a previous visit with a dietitian. He participates in exercise intermittently. His home blood glucose trend is increasing steadily. His overall blood glucose range is >200 mg/dl. (He presents today due to concern for hypoglycemia, however brings his CGM device which was downloaded and  analyzed.  He has 25% glycemia in range, 29% Libre 1 hyperglycemia, 46% able to hyperglycemia.  Has no hypoglycemia.  His average blood glucose is 248 mg per DL for the last 2 weeks.  His recent A1c was 9.2%.  ) An ACE inhibitor/angiotensin II receptor blocker is being taken. He does not see a podiatrist.Eye exam is not current.  Hypertension This is a chronic problem. The current episode started more than 1 year ago. The problem has been resolved since onset. The problem is controlled. Pertinent negatives include no blurred vision. There are no associated agents to hypertension. Risk factors for coronary artery disease include diabetes mellitus, dyslipidemia, male gender, obesity and stress. Past treatments include angiotensin blockers and calcium  channel blockers. The current treatment provides mild improvement. Compliance problems include diet and exercise.  Hypertensive end-organ damage includes kidney disease. Identifiable causes of hypertension include chronic renal disease.  Hyperlipidemia This is a chronic problem. The current episode started more than 1 year ago. The problem is uncontrolled. Recent lipid tests were reviewed and are variable. Exacerbating diseases include chronic renal disease and diabetes. Factors aggravating his hyperlipidemia include fatty foods. Current antihyperlipidemic treatment includes statins and fibric acid derivatives. The current treatment provides moderate improvement of lipids. Compliance problems include adherence to diet, adherence to exercise and psychosocial issues.  Risk factors for coronary artery disease include diabetes mellitus, dyslipidemia, hypertension, male sex and obesity.   Hypogonadism: Patient denies any prior history of coronary, thyroid , parathyroid dysfunctions.  He was found to have low testosterone  on routine lab work in March and June.  Patient denies any prior history of testicular injury, radiation, nor chemotherapy.  He does not have any  significant history of head injury.  He has 1 grown child.  He is not looking to keep fertility.  His major concern is fatigue and low libido. He wishes to be treated with androgen replacement therapy as appropriate.  Review of systems  Constitutional: + Minimally fluctuating body weight, current BMI of 30.16  + chronic fatigue, no subjective hyperthermia, no subjective hypothermia Eyes: no blurry vision, no xerophthalmia ENT: no sore throat, no nodules palpated in throat, no dysphagia/odynophagia, no hoarseness Cardiovascular: no chest pain, no shortness of breath, no palpitations, no leg swelling Respiratory: no cough, no shortness of breath Gastrointestinal: no nausea/vomiting/diarrhea Musculoskeletal: no muscle/joint aches Skin: no rashes, no hyperemia  Neurological: no tremors, no numbness, no tingling, no dizziness Psychiatric: no depression, no anxiety   Objective:     BP (!) 148/74   Pulse 76   Ht 5' 10 (1.778 m)   Wt 210 lb 3.2 oz (95.3 kg)   BMI 30.16 kg/m   Wt Readings from Last 3 Encounters:  10/17/23 210 lb 3.2 oz (95.3 kg)  10/06/23 211 lb 6.4 oz (95.9 kg)  09/15/23 203 lb (92.1 kg)    BP Readings from Last 3 Encounters:  10/17/23 (!) 148/74  10/06/23 138/62  09/15/23 (!) 144/82     Physical Exam- Limited  Constitutional:  Body mass index is 30.16 kg/m. , not in acute distress, normal state of mind Eyes:  EOMI, no exophthalmos Musculoskeletal: no gross deformities, strength intact in all four extremities, no gross restriction of joint movements Skin:  no rashes, no hyperemia Neurological: no tremor with outstretched hands Genital Exam: Bilateral testicles 12 cc, soft in consistency.  He does not have any other extra testicular, intra scrotal mass lesions.  No hernia.   Diabetic Foot Exam - Simple   No data filed     CMP ( most recent) CMP     Component Value Date/Time   NA 138 09/08/2023 0810   K 4.8 09/08/2023 0810   CL 99 09/08/2023 0810    CO2 22 09/08/2023 0810   GLUCOSE 240 (H) 09/08/2023 0810   GLUCOSE 264 (H) 11/25/2021 0422   BUN 24 09/08/2023 0810   CREATININE 1.57 (H) 09/08/2023 0810   CREATININE 0.91 02/25/2014 0727   CALCIUM  10.2 09/08/2023 0810   PROT 7.2 06/09/2023 0802   ALBUMIN 4.8 09/08/2023 0806   AST 42 (H) 06/09/2023 0802   ALT 36 06/09/2023 0802   ALKPHOS 43 (L) 06/09/2023 0802   BILITOT 0.3 06/09/2023 0802   GFRNONAA 52 (L) 11/25/2021 0422   GFRAA 66 01/17/2020 0928     Diabetic Labs (most recent): Lab Results  Component Value Date   HGBA1C 9.7 (A) 09/15/2023   HGBA1C 9.8 (A) 06/02/2023   HGBA1C 8.8 (A) 03/03/2023   MICROALBUR 150 07/09/2021   MICROALBUR 150 03/09/2020   MICROALBUR 3.36 (H) 05/11/2013     Lipid Panel ( most recent) Lipid Panel     Component Value Date/Time   CHOL 105 09/08/2023 0810   TRIG 119 09/08/2023 0810   HDL 35 (L) 09/08/2023 0810   CHOLHDL 3.0 09/08/2023 0810   CHOLHDL 5.6 08/06/2021 0806   VLDL 52 (H) 08/06/2021 0806   LDLCALC 48 09/08/2023 0810   LABVLDL 22 09/08/2023 0810      Lab Results  Component Value Date   TSH 1.140 06/09/2023   TSH 0.713 04/30/2021   TSH 1.280 06/29/2020   TSH 1.470 11/11/2019   TSH 2.144 04/04/2019   TSH 0.986 07/01/2015   FREET4 1.48 06/09/2023   FREET4 1.39 06/29/2020   FREET4 1.44 11/11/2019           Latest Reference Range & Units 06/09/23 08:02 09/08/23 08:06  Sex Horm Binding Glob, Serum 19.3 - 76.4 nmol/L 28.6 36.9  Testosterone  264 - 916 ng/dL 41 (L) 76 (L)  Testost., Free, Calc 34.7 - 150.3 pg/mL 7.8 (L) 12.4 (L)  TSH 0.450 - 4.500 uIU/mL 1.140   Triiodothyronine,Free,Serum 2.0 - 4.4 pg/mL 4.0   T4,Free(Direct) 0.82 - 1.77 ng/dL 8.51   (L): Data is abnormally low Assessment & Plan:    1) hypogonadism: On 2 separate occasions in March 2025 in June 2025 was found  to have profound hypogonadism with total testosterone  41 and 76 ng per DL respectively.  He also had associated low free testosterone  of 7.8  and 12.4 pg per DL. Patient does not have gonadotropins, prolactin nor ferritin measurement.  He will be considered for more complete assessment of his HPG axis before he returns for follow-up in 1 week. If he is confirmed to have clinically significant hypogonadism, he will be considered for androgen replacement therapy. We briefly discussed options of treatment and safe use of testosterone .  2) DM type 2 without retinopathy , with nephropathy  - Terry [name] has currently uncontrolled symptomatic type 2 DM since  63 years of age.  He presents today due to concern for hypoglycemia, however brings his CGM device which was downloaded and analyzed.  He has 25% glycemia in range, 29% Libre 1 hyperglycemia, 46% able to hyperglycemia.  Has no hypoglycemia.  His average blood glucose is 248 mg per DL for the last 2 weeks.  His recent A1c was 9.2%.  This is despite very large dose of insulin  on basal/bolus regimen.   Recent labs reviewed.   - I had a long discussion with him about the progressive nature of diabetes and the pathology behind its complications. -his diabetes is complicated by CKD stage 3a and he remains at a high risk for more acute and chronic complications which include CAD, CVA, CKD, retinopathy, and neuropathy. These are all discussed in detail with him.  - Nutritional counseling repeated at each appointment due to patients tendency to fall back in to old habits.  - The patient admits there is a room for improvement in their diet and drink choices. -  Suggestion is made for the patient to avoid simple carbohydrates from their diet including Cakes, Sweet Desserts / Pastries, Ice Cream, Soda (diet and regular), Sweet Tea, Candies, Chips, Cookies, Sweet Pastries, Store Bought Juices, Alcohol in Excess of 1-2 drinks a day, Artificial Sweeteners, Coffee Creamer, and Sugar-free Products. This will help patient to have stable blood glucose profile and potentially avoid unintended  weight gain.   - I encouraged the patient to switch to unprocessed or minimally processed complex starch and increased protein intake (animal or plant source), fruits, and vegetables.   - Patient is advised to stick to a routine mealtimes to eat 3 meals a day and avoid unnecessary snacks (to snack only to correct hypoglycemia).  - I have approached him with the following individualized plan to manage  his diabetes and patient agrees:   -He displays severe insulin  resistance with uncontrolled diabetes type 2 despite very large dose of insulin .   He will be considered for lifestyle medicine during his next visit.  In the meantime, he is advised to continue  Toujeo  130 units SQ nightly  and continue his Humalog  35-41 units TID with meals if glucose is above 90 and he is eating (Specific instructions on how to titrate insulin  dosage based on glucose readings given to patient in writing).  He can also continue Glipizide  5 mg XL daily with breakfast.    -He is encouraged to continue monitoring glucose 4 times daily (using his CGM), before meals and before bed, and to call the clinic if he has readings less than 70 or greater than 300 for 3 tests in a row.    - he is warned not to take insulin  without proper monitoring per orders.  -He is NOT candidate for any GLP1 due to severe allergic reaction to Trulicity .  He  did not tolerate Metformin  in the past (even in the ER form).  He has been on SGLT2i and did not tolerate them due to chronic laryngitis.  We did also discuss insulin  pumps and encouraged him to reach out to his insurance for coverage/price options for Omnipod DASH/5 and or Tandem T-Slim.  Says all of these options are too expensive for his budget right now.  - Specific targets for  A1c;  LDL, HDL,  and Triglycerides were discussed with the patient.  3) Blood Pressure /Hypertension: His blood pressure is not controlled to target.    He is advised to continue Diovan  160 mg, Amlodipine  10 mg po  daily, Indapamide  2.5 mg po daily, and Metoprolol  100 mg po daily.      4) Lipids/Hyperlipidemia:    His most recent lipid panel from 09/08/23 shows controlled LDL of 48.  He is advised to continue Crestor  40 mg po daily at bedtime and Fenofibrate  160 mg po daily.  Side effects and precautions discussed with him.  We discussed the need to avoid fried foods and butter to reduce his risk of cardiovascular events.  5)  Weight/Diet:  His Body mass index is 30.16 kg/m.  -   he is a candidate for weight loss. I discussed with him the fact that loss of 5 - 10% of his  current body weight will have the most impact on his diabetes management.  Exercise, and detailed carbohydrates information provided  -  detailed on discharge instructions.  6) Chronic Care/Health Maintenance: -he on ACEI/ARB and Statin medications and  is encouraged to initiate and continue to follow up with Ophthalmology, Dentist, Podiatrist at least yearly or according to recommendations, and advised to stay away from smoking. I have recommended yearly flu vaccine and pneumonia vaccine at least every 5 years; moderate intensity exercise for up to 150 minutes weekly; and  sleep for at least 7 hours a day.  - he is advised to maintain close follow up with Alphonsa Glendia LABOR, MD for primary care needs, as well as his other providers for optimal and coordinated care.   I spent  26  minutes in the care of the patient today including review of labs from CMP, Lipids, Thyroid  Function, Hematology (current and previous including abstractions from other facilities); face-to-face time discussing  his blood glucose readings/logs, discussing hypoglycemia and hyperglycemia episodes and symptoms, medications doses, his options of short and long term treatment based on the latest standards of care / guidelines;  discussion about incorporating lifestyle medicine;  and documenting the encounter. Risk reduction counseling performed per USPSTF guidelines to reduce   obesity and cardiovascular risk factors.     Please refer to Patient Instructions for Blood Glucose Monitoring and Insulin /Medications Dosing Guide  in media tab for additional information. Please  also refer to  Patient Self Inventory in the Media  tab for reviewed elements of pertinent patient history.  Terry [name] participated in the discussions, expressed understanding, and voiced agreement with the above plans.  All questions were answered to his satisfaction. he is encouraged to contact clinic should he have any questions or concerns prior to his return visit.    Follow up plan: - Return in about 1 week (around 10/24/2023) for Fasting Labs  in AM B4 8.   [place], [facility] [address] [place], [place] [address] Phone: [phone] Fax: [phone]  10/17/2023, 9:53 AM

## 2023-10-18 DIAGNOSIS — E291 Testicular hypofunction: Secondary | ICD-10-CM | POA: Diagnosis not present

## 2023-10-19 LAB — FOLLICLE STIMULATING HORMONE: FSH: 5.7 m[IU]/mL (ref 1.5–12.4)

## 2023-10-19 LAB — CBC WITH DIFFERENTIAL/PLATELET
Basophils Absolute: 0.1 x10E3/uL (ref 0.0–0.2)
Basos: 1 %
EOS (ABSOLUTE): 0.1 x10E3/uL (ref 0.0–0.4)
Eos: 2 %
Hematocrit: 40.7 % (ref 37.5–51.0)
Hemoglobin: 13.7 g/dL (ref 13.0–17.7)
Immature Grans (Abs): 0 x10E3/uL (ref 0.0–0.1)
Immature Granulocytes: 0 %
Lymphocytes Absolute: 2.5 x10E3/uL (ref 0.7–3.1)
Lymphs: 43 %
MCH: 31.2 pg (ref 26.6–33.0)
MCHC: 33.7 g/dL (ref 31.5–35.7)
MCV: 93 fL (ref 79–97)
Monocytes Absolute: 0.5 x10E3/uL (ref 0.1–0.9)
Monocytes: 9 %
Neutrophils Absolute: 2.6 x10E3/uL (ref 1.4–7.0)
Neutrophils: 45 %
Platelets: 154 x10E3/uL (ref 150–450)
RBC: 4.39 x10E6/uL (ref 4.14–5.80)
RDW: 13.2 % (ref 11.6–15.4)
WBC: 5.8 x10E3/uL (ref 3.4–10.8)

## 2023-10-19 LAB — FERRITIN: Ferritin: 336 ng/mL (ref 30–400)

## 2023-10-19 LAB — LUTEINIZING HORMONE: LH: 4.5 m[IU]/mL (ref 1.7–8.6)

## 2023-10-19 LAB — PROLACTIN: Prolactin: 9.5 ng/mL (ref 3.6–25.2)

## 2023-10-19 LAB — TESTOSTERONE, FREE, TOTAL, SHBG
Sex Hormone Binding: 30.2 nmol/L (ref 19.3–76.4)
Testosterone, Free: 3.9 pg/mL — ABNORMAL LOW (ref 6.6–18.1)
Testosterone: 134 ng/dL — ABNORMAL LOW (ref 264–916)

## 2023-10-19 LAB — PSA: Prostate Specific Ag, Serum: 0.1 ng/mL (ref 0.0–4.0)

## 2023-10-25 ENCOUNTER — Ambulatory Visit (INDEPENDENT_AMBULATORY_CARE_PROVIDER_SITE_OTHER): Admitting: "Endocrinology

## 2023-10-25 ENCOUNTER — Encounter: Payer: Self-pay | Admitting: "Endocrinology

## 2023-10-25 VITALS — BP 150/78 | HR 84 | Ht 70.0 in | Wt 212.2 lb

## 2023-10-25 DIAGNOSIS — I10 Essential (primary) hypertension: Secondary | ICD-10-CM | POA: Diagnosis not present

## 2023-10-25 DIAGNOSIS — E66811 Obesity, class 1: Secondary | ICD-10-CM

## 2023-10-25 DIAGNOSIS — E291 Testicular hypofunction: Secondary | ICD-10-CM

## 2023-10-25 DIAGNOSIS — Z6830 Body mass index (BMI) 30.0-30.9, adult: Secondary | ICD-10-CM

## 2023-10-25 DIAGNOSIS — Z794 Long term (current) use of insulin: Secondary | ICD-10-CM | POA: Diagnosis not present

## 2023-10-25 DIAGNOSIS — E1122 Type 2 diabetes mellitus with diabetic chronic kidney disease: Secondary | ICD-10-CM | POA: Diagnosis not present

## 2023-10-25 DIAGNOSIS — E6609 Other obesity due to excess calories: Secondary | ICD-10-CM | POA: Diagnosis not present

## 2023-10-25 DIAGNOSIS — N1831 Chronic kidney disease, stage 3a: Secondary | ICD-10-CM

## 2023-10-25 DIAGNOSIS — E782 Mixed hyperlipidemia: Secondary | ICD-10-CM

## 2023-10-25 MED ORDER — INSULIN LISPRO 100 UNIT/ML IJ SOLN: 30.0000 [IU] | Freq: Three times a day (TID) | INTRAMUSCULAR | 2 refills | Status: DC

## 2023-10-25 MED ORDER — TESTOSTERONE 20.25 MG/ACT (1.62%) TD GEL
TRANSDERMAL | 0 refills | Status: DC
Start: 2023-10-25 — End: 2023-12-27

## 2023-10-25 MED ORDER — TOUJEO MAX SOLOSTAR 300 UNIT/ML ~~LOC~~ SOPN: 130.0000 [IU] | PEN_INJECTOR | Freq: Every evening | SUBCUTANEOUS | 1 refills | Status: DC

## 2023-10-25 NOTE — Patient Instructions (Signed)

## 2023-10-25 NOTE — Progress Notes (Signed)
 Endocrinology Follow Up Visit      10/25/2023, 9:25 AM   Subjective:    Patient ID: Terry [name], male    DOB: March 14, 1961.  Terry [name] is being seen in follow up after being seen in consultation for management of currently uncontrolled symptomatic diabetes requested by  Alphonsa Glendia LABOR, MD. This visit is specifically for his hypogonadism.  Past Medical History:  Diagnosis Date   Diabetes mellitus without complication (HCC) 2004   GERD (gastroesophageal reflux disease)    Hyperlipidemia    Hypertension    Osteoarthritis     Past Surgical History:  Procedure Laterality Date   ANKLE SURGERY Left 03/28/2005   2 plates/screws   COLONOSCOPY N/A 08/04/2017   Procedure: COLONOSCOPY;  Surgeon: Golda Claudis PENNER, MD;  Location: [facility];  Service: Endoscopy;  Laterality: N/A;   ESOPHAGEAL DILATION N/A 08/04/2017   Procedure: ESOPHAGEAL DILATION;  Surgeon: Golda Claudis PENNER, MD;  Location: [facility];  Service: Endoscopy;  Laterality: N/A;   ESOPHAGOGASTRODUODENOSCOPY N/A 08/04/2017   Procedure: ESOPHAGOGASTRODUODENOSCOPY (EGD);  Surgeon: Golda Claudis PENNER, MD;  Location: [facility];  Service: Endoscopy;  Laterality: N/A;   POLYPECTOMY  08/04/2017   Procedure: POLYPECTOMY;  Surgeon: Golda Claudis PENNER, MD;  Location: [facility];  Service: Endoscopy;;   ROOT CANAL      Social History   Socioeconomic History   Marital status: Married    Spouse name: Not on file   Number of children: Not on file   Years of education: Not on file   Highest education level: 10th grade  Occupational History   Not on file  Tobacco Use   Smoking status: Former    Current packs/day: 0.00    Types: Cigarettes    Quit date: 06/15/1992    Years since quitting: 31.3   Smokeless tobacco: Never  Vaping Use   Vaping status: Never Used  Substance and Sexual Activity   Alcohol use: Never   Drug use: Not Currently     Types: Marijuana    Comment: last used 1970   Sexual activity: Yes  Other Topics Concern   Not on file  Social History Narrative   Not on file   Social Drivers of Health   Financial Resource Strain: Medium Risk (03/17/2023)   Overall Financial Resource Strain (CARDIA)    Difficulty of Paying Living Expenses: Somewhat hard  Food Insecurity: Food Insecurity Present (03/17/2023)   Hunger Vital Sign    Worried About Running Out of Food in the Last Year: Sometimes true    Ran Out of Food in the Last Year: Never true  Transportation Needs: No Transportation Needs (03/17/2023)   PRAPARE - Administrator, Civil Service (Medical): No    Lack of Transportation (Non-Medical): No  Physical Activity: Sufficiently Active (03/17/2023)   Exercise Vital Sign    Days of Exercise per Week: 5 days    Minutes of Exercise per Session: 150+ min  Stress: No Stress Concern Present (03/17/2023)   [employer] of Occupational Health - Occupational Stress Questionnaire    Feeling of Stress : Only a little  Social Connections: Moderately Integrated (03/17/2023)   Social Connection  and Isolation Panel    Frequency of Communication with Friends and Family: More than three times a week    Frequency of Social Gatherings with Friends and Family: Once a week    Attends Religious Services: 1 to 4 times per year    Active Member of [employer] or Organizations: No    Attends Engineer, structural: Not on file    Marital Status: Married    Family History  Problem Relation Age of Onset   Hypertension Mother    Diabetes Mother    Heart disease Mother    Hyperlipidemia Mother    Cancer Father        lung    Outpatient Encounter Medications as of 10/25/2023  Medication Sig   Testosterone  20.25 MG/ACT (1.62%) GEL Apply 20.25mg /ACT on each shoulder every morning   amLODipine  (NORVASC ) 5 MG tablet Take 1 tablet (5 mg total) by mouth daily.   aspirin  EC 81 MG tablet Take 1 tablet (81 mg  total) by mouth daily with breakfast.   Continuous Glucose Sensor (DEXCOM G7 SENSOR) MISC Inject 1 Application into the skin as directed. Change sensor every 10 days as directed.   Continuous Glucose Transmitter (DEXCOM G6 TRANSMITTER) MISC Change transmitter every 90 days (Patient not taking: Reported on 10/06/2023)   ezetimibe  (ZETIA ) 10 MG tablet Take 1 tablet (10 mg total) by mouth daily.   fenofibrate  160 MG tablet Take 1 tablet (160 mg total) by mouth daily.   glipiZIDE  (GLUCOTROL  XL) 5 MG 24 hr tablet Take 1 tablet (5 mg total) by mouth daily with breakfast.   glucose blood (CONTOUR NEXT TEST) test strip Use as instructed to monitor glucose 4 times daily   indapamide  (LOZOL ) 2.5 MG tablet TAKE (1) TABLET BY MOUTH ONCE DAILY.   insulin  glargine, 2 Unit Dial, (TOUJEO  MAX SOLOSTAR) 300 UNIT/ML Solostar Pen Inject 130 Units into the skin at bedtime.   insulin  lispro (HUMALOG ) 100 UNIT/ML injection Inject 0.3-0.36 mLs (30-36 Units total) into the skin 3 (three) times daily with meals.   Insulin  Pen Needle (PEN NEEDLES) 31G X 6 MM MISC Use to inject insulin  4 times daily   metoprolol  succinate (TOPROL -XL) 100 MG 24 hr tablet TAKE (1) TABLET BY MOUTH ONCE DAILY.   Multiple Vitamin (MULTIVITAMIN) tablet Take 1 tablet by mouth daily.   oxyCODONE -acetaminophen  (PERCOCET) 10-325 MG tablet 1 taken 3 to 4 times daily as needed for pain   oxyCODONE -acetaminophen  (PERCOCET) 10-325 MG tablet 1 taken 3 to 4 times daily as needed for pain   oxyCODONE -acetaminophen  (PERCOCET) 10-325 MG tablet 1  taken 4 times daily as needed for pain   rosuvastatin  (CRESTOR ) 40 MG tablet Take 1 tablet (40 mg total) by mouth daily.   triamcinolone  cream (KENALOG ) 0.1 % APPLY TO AFFECTED AREA SPARINGLY THREE TIMES DAILY.   valsartan  (DIOVAN ) 160 MG tablet Take 1 tablet (160 mg total) by mouth daily.   [DISCONTINUED] insulin  glargine, 2 Unit Dial, (TOUJEO  MAX SOLOSTAR) 300 UNIT/ML Solostar Pen Inject 140 Units into the skin at  bedtime.   [DISCONTINUED] insulin  lispro (HUMALOG ) 100 UNIT/ML injection Inject 0.41 mLs (41 Units total) into the skin 3 (three) times daily with meals.   No facility-administered encounter medications on file as of 10/25/2023.    ALLERGIES: Allergies  Allergen Reactions   Invokana  [Canagliflozin ] Other (See Comments)    laryngitis   Trulicity  [Dulaglutide ] Swelling    Patient also did not tolerate because of blistering in the mouth would avoid all GLP-1's patient  was hospitalized with diabetic ketoacidosis August 2023   Altace [Ramipril] Cough   Metformin  And Related Diarrhea    VACCINATION STATUS: Immunization History  Administered Date(s) Administered   Influenza, Seasonal, Injecte, Preservative Fre 12/20/2022   Influenza,inj,Quad PF,6+ Mos 01/30/2014, 01/29/2016, 01/30/2017, 01/26/2018, 01/23/2019, 01/17/2020, 01/01/2021   Influenza-Unspecified 01/26/2012, 02/21/2015, 01/10/2022   PNEUMOCOCCAL CONJUGATE-20 03/21/2023   Pneumococcal Polysaccharide-23 01/30/2014   Td 12/19/2014    Diabetes He presents for his follow-up diabetic visit. He has type 2 diabetes mellitus. Onset time: Diagnosed at approximate age of 71. His disease course has been worsening. There are no hypoglycemic associated symptoms. Pertinent negatives for hypoglycemia include no nervousness/anxiousness. Associated symptoms include fatigue, polydipsia and polyuria. Pertinent negatives for diabetes include no blurred vision, no polyphagia and no weight loss. There are no hypoglycemic complications. Nocturnal hypoglycemia: mild. Symptoms are worsening. Diabetic complications include nephropathy. Risk factors for coronary artery disease include diabetes mellitus, dyslipidemia, hypertension, male sex, obesity, stress and sedentary lifestyle. Current diabetic treatment includes intensive insulin  program and oral agent (monotherapy) (He has documented adverse reactions to GLP-1 receptors and SGLT2 inhibitors as well as  metformin .  He is only on basal-bolus insulin  and glipizide .). He is compliant with treatment most of the time. His weight is fluctuating minimally. He is following a generally unhealthy diet. When asked about meal planning, he reported none. He has not had a previous visit with a dietitian. He participates in exercise intermittently. His home blood glucose trend is increasing steadily. His breakfast blood glucose range is generally >200 mg/dl. His lunch blood glucose range is generally >200 mg/dl. His dinner blood glucose range is generally >200 mg/dl. His bedtime blood glucose range is generally >200 mg/dl. His overall blood glucose range is >200 mg/dl. (He presents today to discuss his recent labs for hypogonadism, brought his Dexcom CGM with him.  His overview shows only 11% in range, 19% level 1 hyperglycemia, 70% level 2 hyperglycemia.  Has no hypoglycemia.  His average blood glucose is 300 mg per DL.  His most recent A1c was 9.7%.  ) An ACE inhibitor/angiotensin II receptor blocker is being taken. He does not see a podiatrist.Eye exam is not current.  Hypertension This is a chronic problem. The current episode started more than 1 year ago. The problem has been resolved since onset. The problem is controlled. Pertinent negatives include no blurred vision. There are no associated agents to hypertension. Risk factors for coronary artery disease include diabetes mellitus, dyslipidemia, male gender, obesity and stress. Past treatments include angiotensin blockers and calcium  channel blockers. The current treatment provides mild improvement. Compliance problems include diet and exercise.  Hypertensive end-organ damage includes kidney disease. Identifiable causes of hypertension include chronic renal disease.  Hyperlipidemia This is a chronic problem. The current episode started more than 1 year ago. The problem is uncontrolled. Recent lipid tests were reviewed and are variable. Exacerbating diseases include  chronic renal disease and diabetes. Factors aggravating his hyperlipidemia include fatty foods. Current antihyperlipidemic treatment includes statins and fibric acid derivatives. The current treatment provides moderate improvement of lipids. Compliance problems include adherence to diet, adherence to exercise and psychosocial issues.  Risk factors for coronary artery disease include diabetes mellitus, dyslipidemia, hypertension, male sex and obesity.   Hypogonadism: Patient denies any prior history of coronary, thyroid , parathyroid dysfunctions.  He was found to have low testosterone  on routine lab work in March and June.  Patient denies any prior history of testicular injury, radiation, nor chemotherapy.  He does not have any  significant history of head injury.  He has 1 grown child.  He is not looking to keep fertility.  His major concern is fatigue and low libido. He wishes to be treated with androgen replacement therapy as appropriate.  Review of systems  Constitutional: + Minimally fluctuating body weight, current BMI of 30.16  + chronic fatigue, no subjective hyperthermia, no subjective hypothermia Eyes: no blurry vision, no xerophthalmia    Objective:     BP (!) 150/78   Pulse 84   Ht 5' 10 (1.778 m)   Wt 212 lb 3.2 oz (96.3 kg)   BMI 30.45 kg/m   Wt Readings from Last 3 Encounters:  10/25/23 212 lb 3.2 oz (96.3 kg)  10/17/23 210 lb 3.2 oz (95.3 kg)  10/06/23 211 lb 6.4 oz (95.9 kg)    BP Readings from Last 3 Encounters:  10/25/23 (!) 150/78  10/17/23 (!) 148/74  10/06/23 138/62     Physical Exam- Limited  Constitutional:  Body mass index is 30.45 kg/m. , not in acute distress, normal state of mind Eyes:  EOMI, no exophthalmos Musculoskeletal: no gross deformities, strength intact in all four extremities, no gross restriction of joint movements Skin:  no rashes, no hyperemia Neurological: no tremor with outstretched hands Genital Exam: Bilateral testicles 12 cc, soft  in consistency.  He does not have any other extra testicular, intra scrotal mass lesions.  No hernia.   Diabetic Foot Exam - Simple   No data filed     CMP ( most recent) CMP     Component Value Date/Time   NA 138 09/08/2023 0810   K 4.8 09/08/2023 0810   CL 99 09/08/2023 0810   CO2 22 09/08/2023 0810   GLUCOSE 240 (H) 09/08/2023 0810   GLUCOSE 264 (H) 11/25/2021 0422   BUN 24 09/08/2023 0810   CREATININE 1.57 (H) 09/08/2023 0810   CREATININE 0.91 02/25/2014 0727   CALCIUM  10.2 09/08/2023 0810   PROT 7.2 06/09/2023 0802   ALBUMIN 4.8 09/08/2023 0806   AST 42 (H) 06/09/2023 0802   ALT 36 06/09/2023 0802   ALKPHOS 43 (L) 06/09/2023 0802   BILITOT 0.3 06/09/2023 0802   GFRNONAA 52 (L) 11/25/2021 0422   GFRAA 66 01/17/2020 0928     Diabetic Labs (most recent): Lab Results  Component Value Date   HGBA1C 9.7 (A) 09/15/2023   HGBA1C 9.8 (A) 06/02/2023   HGBA1C 8.8 (A) 03/03/2023   MICROALBUR 150 07/09/2021   MICROALBUR 150 03/09/2020   MICROALBUR 3.36 (H) 05/11/2013     Lipid Panel ( most recent) Lipid Panel     Component Value Date/Time   CHOL 105 09/08/2023 0810   TRIG 119 09/08/2023 0810   HDL 35 (L) 09/08/2023 0810   CHOLHDL 3.0 09/08/2023 0810   CHOLHDL 5.6 08/06/2021 0806   VLDL 52 (H) 08/06/2021 0806   LDLCALC 48 09/08/2023 0810   LABVLDL 22 09/08/2023 0810      Lab Results  Component Value Date   TSH 1.140 06/09/2023   TSH 0.713 04/30/2021   TSH 1.280 06/29/2020   TSH 1.470 11/11/2019   TSH 2.144 04/04/2019   TSH 0.986 07/01/2015   FREET4 1.48 06/09/2023   FREET4 1.39 06/29/2020   FREET4 1.44 11/11/2019           Latest Reference Range & Units 06/09/23 08:02 09/08/23 08:06  Sex Horm Binding Glob, Serum 19.3 - 76.4 nmol/L 28.6 36.9  Testosterone  264 - 916 ng/dL 41 (L) 76 (L)  Testost., Free,  Calc 34.7 - 150.3 pg/mL 7.8 (L) 12.4 (L)  TSH 0.450 - 4.500 uIU/mL 1.140   Triiodothyronine,Free,Serum 2.0 - 4.4 pg/mL 4.0   T4,Free(Direct) 0.82 -  1.77 ng/dL 8.51     Recent Results (from the past 2160 hours)  Testosterone  Free, Profile I     Status: Abnormal   Collection Time: 09/08/23  8:06 AM  Result Value Ref Range   Albumin 4.8 3.9 - 4.9 g/dL   Testosterone  76 (L) 264 - 916 ng/dL    Comment: Adult male reference interval is based on a population of healthy nonobese males (BMI <30) between 37 and 65 years old. Travison, et.al. JCEM [phone]. PMID: 71675896.    Sex Hormone Binding 36.9 19.3 - 76.4 nmol/L   Testost., Free, Calc 12.4 (L) 34.7 - 150.3 pg/mL  PSA     Status: None   Collection Time: 09/08/23  8:10 AM  Result Value Ref Range   Prostate Specific Ag, Serum 0.1 0.0 - 4.0 ng/mL    Comment: Roche ECLIA methodology. According to the American Urological Association, Serum PSA should decrease and remain at undetectable levels after radical prostatectomy. The AUA defines biochemical recurrence as an initial PSA value 0.2 ng/mL or greater followed by a subsequent confirmatory PSA value 0.2 ng/mL or greater. Values obtained with different assay methods or kits cannot be used interchangeably. Results cannot be interpreted as absolute evidence of the presence or absence of malignant disease.   Lipid Panel     Status: Abnormal   Collection Time: 09/08/23  8:10 AM  Result Value Ref Range   Cholesterol, Total 105 100 - 199 mg/dL   Triglycerides 880 0 - 149 mg/dL   HDL 35 (L) >60 mg/dL   VLDL Cholesterol Cal 22 5 - 40 mg/dL   LDL Chol Calc (NIH) 48 0 - 99 mg/dL   Chol/HDL Ratio 3.0 0.0 - 5.0 ratio    Comment:                                   T. Chol/HDL Ratio                                             Men  Women                               1/2 Avg.Risk  3.4    3.3                                   Avg.Risk  5.0    4.4                                2X Avg.Risk  9.6    7.1                                3X Avg.Risk 23.4   11.0   Basic Metabolic Panel     Status: Abnormal   Collection Time: 09/08/23  8:10  AM  Result Value Ref Range   Glucose 240 (H) 70 - 99  mg/dL   BUN 24 8 - 27 mg/dL   Creatinine, Ser 8.42 (H) 0.76 - 1.27 mg/dL   eGFR 50 (L) >40 fO/fpw/8.26   BUN/Creatinine Ratio 15 10 - 24   Sodium 138 134 - 144 mmol/L   Potassium 4.8 3.5 - 5.2 mmol/L   Chloride 99 96 - 106 mmol/L   CO2 22 20 - 29 mmol/L   Calcium  10.2 8.6 - 10.2 mg/dL  Microalbumin/Creatinine Ratio, Urine     Status: Abnormal   Collection Time: 09/08/23  8:10 AM  Result Value Ref Range   Creatinine, Urine 173.7 Not Estab. mg/dL   Microalbumin, Urine 673.1 Not Estab. ug/mL   Microalb/Creat Ratio 188 (H) 0 - 29 mg/g creat    Comment:                        Normal:                0 -  29                        Moderately increased: 30 - 300                        Severely increased:       >300   HgB A1c     Status: Abnormal   Collection Time: 09/15/23  9:41 AM  Result Value Ref Range   Hemoglobin A1C 9.7 (A) 4.0 - 5.6 %   HbA1c POC (<> result, manual entry)     HbA1c, POC (prediabetic range)     HbA1c, POC (controlled diabetic range)    ToxASSURE Select 13 (MW), Urine     Status: None   Collection Time: 10/06/23 12:00 AM  Result Value Ref Range   Summary FINAL     Comment: ==================================================================== Tox[employer] Select 13 (MW) ==================================================================== Test                             Result       Flag       Units  Drug Present and Declared for Prescription Verification   Oxycodone                       458          EXPECTED   ng/mg creat   Oxymorphone                    5405         EXPECTED   ng/mg creat   Noroxycodone                   873          EXPECTED   ng/mg creat   Noroxymorphone                 543          EXPECTED   ng/mg creat    Sources of oxycodone  are scheduled prescription medications.    Oxymorphone, noroxycodone, and noroxymorphone are expected    metabolites of oxycodone . Oxymorphone is also available as  a    scheduled prescription medication.  Drug Present not Declared for Prescription Verification   Carboxy-THC                    136  UNEXPECTED ng/mg creat    Carboxy-THC is a metabolite of tetrahydrocannabinol (THC). Source o f    THC is most commonly herbal marijuana or marijuana-based products,    but THC is also present in a scheduled prescription medication.    Trace amounts of THC can be present in hemp and cannabidiol (CBD)    products. This test is not intended to distinguish between delta-9-    tetrahydrocannabinol, the predominant form of THC in most herbal or    marijuana-based products, and delta-8-tetrahydrocannabinol.  ==================================================================== Test                      Result    Flag   Units      Ref Range   Creatinine              77               mg/dL      >=79 ==================================================================== Declared Medications:  The flagging and interpretation on this report are based on the  following declared medications.  Unexpected results may arise from  inaccuracies in the declared medications.   **Note: The testing scope of this panel includes these medications:   Oxycodone    **Note: The testing [place] of this panel does not include the  following reported medications:   Acetaminophen   Aspirin  ==================================================================== For clinical consultation, please call [phone]. ====================================================================   Med List Attached Separately     Status: None (Preliminary result)   Collection Time: 10/06/23 12:00 AM  Result Value Ref Range   Med List Attached Separately WILL FOLLOW   Med List Option Not Selected     Status: None (Preliminary result)   Collection Time: 10/06/23 12:00 AM  Result Value Ref Range   Med List Option Not Selected WILL FOLLOW   Specimen status report     Status: None    Collection Time: 10/06/23 12:00 AM  Result Value Ref Range   specimen status report Comment     Comment: Please note Please note The date and/or time of collection was not indicated on the requisition as required by state and federal law.  The date of receipt of the specimen was used as the collection date if not supplied.   Prolactin     Status: None   Collection Time: 10/18/23  8:17 AM  Result Value Ref Range   Prolactin 9.5 3.6 - 25.2 ng/mL  Ferritin     Status: None   Collection Time: 10/18/23  8:17 AM  Result Value Ref Range   Ferritin 336 30 - 400 ng/mL  Luteinizing hormone     Status: None   Collection Time: 10/18/23  8:17 AM  Result Value Ref Range   LH 4.5 1.7 - 8.6 mIU/mL  Follicle stimulating hormone     Status: None   Collection Time: 10/18/23  8:17 AM  Result Value Ref Range   FSH 5.7 1.5 - 12.4 mIU/mL  CBC with Differential/Platelet     Status: None   Collection Time: 10/18/23  8:17 AM  Result Value Ref Range   WBC 5.8 3.4 - 10.8 x10E3/uL   RBC 4.39 4.14 - 5.80 x10E6/uL   Hemoglobin 13.7 13.0 - 17.7 g/dL   Hematocrit 59.2 62.4 - 51.0 %   MCV 93 79 - 97 fL   MCH 31.2 26.6 - 33.0 pg   MCHC 33.7 31.5 - 35.7 g/dL   RDW 86.7 88.3 - 84.5 %   Platelets 154  150 - 450 x10E3/uL   Neutrophils 45 Not Estab. %   Lymphs 43 Not Estab. %   Monocytes 9 Not Estab. %   Eos 2 Not Estab. %   Basos 1 Not Estab. %   Neutrophils Absolute 2.6 1.4 - 7.0 x10E3/uL   Lymphocytes Absolute 2.5 0.7 - 3.1 x10E3/uL   Monocytes Absolute 0.5 0.1 - 0.9 x10E3/uL   EOS (ABSOLUTE) 0.1 0.0 - 0.4 x10E3/uL   Basophils Absolute 0.1 0.0 - 0.2 x10E3/uL   Immature Granulocytes 0 Not Estab. %   Immature Grans (Abs) 0.0 0.0 - 0.1 x10E3/uL  PSA     Status: None   Collection Time: 10/18/23  8:17 AM  Result Value Ref Range   Prostate Specific Ag, Serum 0.1 0.0 - 4.0 ng/mL    Comment: Roche ECLIA methodology. According to the American Urological Association, Serum PSA should decrease and remain at  undetectable levels after radical prostatectomy. The AUA defines biochemical recurrence as an initial PSA value 0.2 ng/mL or greater followed by a subsequent confirmatory PSA value 0.2 ng/mL or greater. Values obtained with different assay methods or kits cannot be used interchangeably. Results cannot be interpreted as absolute evidence of the presence or absence of malignant disease.   Testosterone , Free, Total, SHBG     Status: Abnormal   Collection Time: 10/18/23  8:17 AM  Result Value Ref Range   Testosterone  134 (L) 264 - 916 ng/dL    Comment: Adult male reference interval is based on a population of healthy nonobese males (BMI <30) between 15 and 53 years old. Travison, et.al. JCEM [phone]. PMID: [id number].    Testosterone , Free 3.9 (L) 6.6 - 18.1 pg/mL   Sex Hormone Binding 30.2 19.3 - 76.4 nmol/L    Assessment & Plan:    1) hypogonadism: On 3 separate occasions in March 2025 in June 2025 was found to have profound hypogonadism with total testosterone  as low as 41.  His most recent total testosterone  is 134 associated with low free testosterone  of 3.9 picogram/dl. He is gonadotropins are inappropriately normal, prolactin is normal.  No need for pituitary/sella imaging. He wishes to be treated with androgen replacement.  Considering his relative contraindications, he will be initiated on low dose to adjust slowly.  Target total testosterone  will be 250-300 ng per DL.  She will need continuous measurements and adjustment every 3 to 4 months. Safe use of testosterone  was discussed again.   2) DM type 2 without retinopathy , with nephropathy  - Terry [name] has currently uncontrolled symptomatic type 2 DM since  63 years of age.  He presents today to discuss his recent labs for hypogonadism, brought his Dexcom CGM with him.  His overview shows only 11% in range, 19% level 1 hyperglycemia, 70% level 2 hyperglycemia.  Has no hypoglycemia.  His average blood glucose  is 300 mg per DL.  His most recent A1c was 9.7%.     Recent labs reviewed.   - I had a long discussion with him about the progressive nature of diabetes and the pathology behind its complications. -his diabetes is complicated by CKD stage 3a and he remains at exceedingly  high risk for more acute and chronic complications which include CAD, CVA, CKD, retinopathy, and neuropathy. These are all discussed in detail with him.  - Nutritional counseling repeated at each appointment due to patients tendency to fall back in to old habits. -With his permission, lifestyle medicine package was discussed and offered to him.   -  he acknowledges that there is a room for improvement in his food and drink choices. - Suggestion is made for him to avoid simple carbohydrates  from his diet including Cakes, Sweet Desserts, Ice Cream, Soda (diet and regular), Sweet Tea, Candies, Chips, Cookies, Store Bought Juices, Alcohol , Artificial Sweeteners,  Coffee Creamer, and Sugar-free Products, Lemonade. This will help patient to have more stable blood glucose profile and potentially avoid unintended weight gain.  The following Lifestyle Medicine recommendations according to American College of Lifestyle Medicine  [facility]) were discussed and and offered to patient and he  agrees to start the journey:  A. Whole Foods, Plant-Based Nutrition comprising of fruits and vegetables, plant-based proteins, whole-grain carbohydrates was discussed in detail with the patient.   A list for source of those nutrients were also provided to the patient.  Patient will use only water  or unsweetened tea for hydration. B.  The need to stay away from risky substances including alcohol, smoking; obtaining 7 to 9 hours of restorative sleep, at least 150 minutes of moderate intensity exercise weekly, the importance of healthy social connections,  and stress management techniques were discussed. C.  A full color page of  Calorie density of various food  groups per pound showing examples of each food groups was provided to the patient.    - I encouraged the patient to switch to unprocessed or minimally processed complex starch and increased protein intake (animal or plant source), fruits, and vegetables.   - Patient is advised to stick to a routine mealtimes to eat 3 meals a day and avoid unnecessary snacks (to snack only to correct hypoglycemia).  - I have approached him with the following individualized plan to manage  his diabetes and patient agrees:   -He displays severe insulin  resistance with uncontrolled diabetes type 2 despite very large dose of insulin  with most recent A1c of 9.7%.     - In preparation for lifestyle medicine engagement even partially, I proactively lowered his Toujeo  to 130 units subcutaneously nightly, lowered his Humalog  to 30 -36 units TID with meals if glucose is above 90 and he is eating (Specific instructions on how to titrate insulin  dosage based on glucose readings given to patient in writing).  He can also continue Glipizide  5 mg XL daily with breakfast.    -He is encouraged to continue monitoring glucose 4 times daily (using his CGM), before meals and before bed, and to call the clinic if he has readings less than 70 or greater than 300 for 3 tests in a row.    - he is warned not to take insulin  without proper monitoring per orders.  -He is NOT candidate for any GLP1 due to severe allergic reaction to Trulicity .  He did not tolerate Metformin  in the past (even in the ER form).  He has been on SGLT2i and did not tolerate them due to chronic laryngitis.   - Specific targets for  A1c;  LDL, HDL,  and Triglycerides were discussed with the patient.  3) Blood Pressure /Hypertension: -His blood pressure is not controlled to target.  He is advised to continue Diovan  160 mg, Amlodipine  10 mg po daily, Indapamide  2.5 mg po daily, and Metoprolol  100 mg po daily.      4) Lipids/Hyperlipidemia:    His most recent  lipid panel from 09/08/23 shows controlled LDL of 48.  He is advised to continue Crestor  40 mg po daily at bedtime and Fenofibrate  160 mg po daily.  Side effects  and precautions discussed with him.  We discussed the need to avoid fried foods and butter to reduce his risk of cardiovascular events.  5)  Weight/Diet:  His Body mass index is 30.45 kg/m.  -   he is a candidate for weight loss. I discussed with him the fact that loss of 5 - 10% of his  current body weight will have the most impact on his diabetes management.  Exercise, and detailed carbohydrates information provided  -  detailed on discharge instructions.  6) Chronic Care/Health Maintenance: -he on ACEI/ARB and Statin medications and  is encouraged to initiate and continue to follow up with Ophthalmology, Dentist, Podiatrist at least yearly or according to recommendations, and advised to stay away from smoking. I have recommended yearly flu vaccine and pneumonia vaccine at least every 5 years; moderate intensity exercise for up to 150 minutes weekly; and  sleep for at least 7 hours a day.  - he is advised to maintain close follow up with Alphonsa Glendia LABOR, MD for primary care needs, as well as his other providers for optimal and coordinated care.   I spent  42  minutes in the care of the patient today including review of labs from CMP, Lipids, Thyroid  Function, Hematology (current and previous including abstractions from other facilities); face-to-face time discussing  his blood glucose readings/logs, discussing hypoglycemia and hyperglycemia episodes and symptoms, medications doses, his options of short and long term treatment based on the latest standards of care / guidelines;  discussion about incorporating lifestyle medicine;  and documenting the encounter. Risk reduction counseling performed per USPSTF guidelines to reduce  obesity and cardiovascular risk factors.     Please refer to Patient Instructions for Blood Glucose Monitoring and  Insulin /Medications Dosing Guide  in media tab for additional information. Please  also refer to  Patient Self Inventory in the Media  tab for reviewed elements of pertinent patient history.  Terry [name] participated in the discussions, expressed understanding, and voiced agreement with the above plans.  All questions were answered to his satisfaction. he is encouraged to contact clinic should he have any questions or concerns prior to his return visit.   Follow up plan: - Return in about 9 weeks (around 12/27/2023) for Fasting Labs  in AM B4 8, A1c -NV.   Benton Rio, [facility] [address] [place], [place] [address] Phone: [phone] Fax: [phone]  10/25/2023, 9:25 AM

## 2023-11-02 ENCOUNTER — Other Ambulatory Visit (HOSPITAL_COMMUNITY)

## 2023-11-02 ENCOUNTER — Ambulatory Visit (HOSPITAL_COMMUNITY)
Admission: RE | Admit: 2023-11-02 | Discharge: 2023-11-02 | Disposition: A | Discharge status: Home / Self Care | Source: Ambulatory Visit | Attending: Family Medicine | Admitting: Family Medicine

## 2023-11-02 DIAGNOSIS — R011 Cardiac murmur, unspecified: Secondary | ICD-10-CM | POA: Diagnosis not present

## 2023-11-02 LAB — ECHOCARDIOGRAM COMPLETE
AR max vel: 1.33 cm2
AV Area VTI: 1.44 cm2
AV Area mean vel: 1.37 cm2
AV Mean grad: 18 mmHg
AV Peak grad: 34 mmHg
Ao pk vel: 2.91 m/s
Area-P 1/2: 4.74 cm2
Calc EF: 58.5 %
S' Lateral: 2.4 cm
Single Plane A2C EF: 66.4 %
Single Plane A4C EF: 48.4 %

## 2023-11-03 ENCOUNTER — Other Ambulatory Visit: Payer: Self-pay

## 2023-11-03 ENCOUNTER — Ambulatory Visit: Payer: Self-pay | Admitting: Family Medicine

## 2023-11-03 DIAGNOSIS — I517 Cardiomegaly: Secondary | ICD-10-CM

## 2023-12-01 ENCOUNTER — Other Ambulatory Visit (HOSPITAL_COMMUNITY): Payer: Self-pay

## 2023-12-01 ENCOUNTER — Telehealth: Payer: Self-pay | Admitting: Pharmacy Technician

## 2023-12-01 NOTE — Telephone Encounter (Signed)
 Pharmacy Patient Advocate Encounter  Received notification from [employer] that Prior Authorization for oxyCODONE -Acetaminophen  10-325MG  tablets has been APPROVED from 12/01/2023 to 05/29/2024. Ran test claim, Copay is $4.00. This test claim was processed through [facility]- copay amounts may vary at other pharmacies due to pharmacy/plan contracts, or as the patient moves through the different stages of their insurance plan.   PA #/Case ID/Reference #: [id number]

## 2023-12-01 NOTE — Telephone Encounter (Signed)
 Pharmacy Patient Advocate Encounter   Received notification from CoverMyMeds that prior authorization for oxyCODONE -Acetaminophen  10-325MG  tablets is required/requested.   Insurance verification completed.   The patient is insured through [employer] .   Per test claim: PA required; PA submitted to above mentioned insurance via Latent Key/confirmation #/EOC [id number] Status is pending

## 2023-12-08 ENCOUNTER — Telehealth: Payer: Self-pay

## 2023-12-08 ENCOUNTER — Telehealth: Payer: Self-pay | Admitting: *Deleted

## 2023-12-08 ENCOUNTER — Telehealth: Payer: Self-pay | Admitting: Pharmacy Technician

## 2023-12-08 ENCOUNTER — Other Ambulatory Visit (HOSPITAL_COMMUNITY): Payer: Self-pay

## 2023-12-08 NOTE — Telephone Encounter (Signed)
 Pharmacy Patient Advocate Encounter   Received notification from Pt Calls Messages that prior authorization for Dexcom G7 sensor is required/requested.   Insurance verification completed.   The patient is insured through [employer] .   Per test claim: PA required; PA submitted to above mentioned insurance via Latent Key/confirmation #/EOC [id number] Status is pending

## 2023-12-08 NOTE — Telephone Encounter (Signed)
 Pharmacy Patient Advocate Encounter  Received notification from [employer] that Prior Authorization for Dexcom G7 sensor has been APPROVED from 12/08/23 to 06/05/24   PA #/Case ID/Reference #: [id number]

## 2023-12-08 NOTE — Telephone Encounter (Signed)
 Per Winter at [facility] patient needs a PA for his Dexcom G7 sensor. Talked with the patient and he shares that he has been sticking his finger but that he wanted to go back to the Dexcom Sensor.  Patient also shares that his insurance now is [employer]. Patient would like to know what he would have to pay if he were approved. He is using [employer] now.  He was advised that this would be sent to the PA team for review.

## 2023-12-08 NOTE — Telephone Encounter (Signed)
 Pharmacy Patient Advocate Encounter   Received notification from CoverMyMeds that prior authorization for Toujeo  Max SoloStar 300UNIT/ML pen-injectors  is required/requested.   Insurance verification completed.   The patient is insured through [employer] .   Per test claim: PA required and submitted KEY/EOC/Request #: [id number] from 12/08/23 to 12/07/24. Ran test claim, Copay is $$4.00. This test claim was processed through [facility]- copay amounts may vary at other pharmacies due to pharmacy/plan contracts, or as the patient moves through the different stages of their insurance plan.

## 2023-12-08 NOTE — Telephone Encounter (Signed)
Patient was called and a message was left. 

## 2023-12-18 NOTE — Progress Notes (Unsigned)
 Cardiology Office Note:  .   Date:  12/20/2023  ID:  Terry [name], DOB March 21, 1961, MRN [id number] PCP: Alphonsa Glendia LABOR, MD  [facility] Providers Cardiologist:  Alvan Carrier, MD  History of Present Illness: .   Terry [name] is a 63 y.o. male  with PMHx of paroxysmal atrial fibrillation (transient episode in 03/2019 while admitted for COVID and DKA with no recurrence of outpatient monitor), Type 2 DM, HTN, HLD, aortic stenosis (10/2023: mild)  who reports to [facility] office for follow up.   Last seen in heartcare 2023 by Laymon Qua, PA-C for overdue follow up.  Reported more SOB but overall doing well from a cardiac standpoint.  Noted losing amlodipine  and PCP started on Procardia  30 mg daily, however BP remained elevated in OV and at home.  Discontinue Procardia  and will be started amlodipine  10 mg daily.  Continued on Lozol  2.5 mg daily, Toprol -XL 100 mg daily, valsartan  160 mg daily, Crestor  40 mg daily, fenofibrate  160 mg daily.   Today, reports ongoing unchanged SOB with longer distance that resolves with rest.  Notes being lightheaded with position changes but this has significantly improved since retirement.  Denies any chest pain, dizziness, edema, orthopnea, palpitations, syncope. Reports compliance with medications.  Home BP usually 130/90s and usually more elevated in medical offices.  Reports very poor diet including high sodium, high sugar and a lot of soda.  Notes that endocrinology encouraged patient to become vegan for better A1c control but patient is not interested.  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  Patient is able to complete yard work and walk around grocery store without any exertional concerns. Denies any recent hospitalizations or visits to the emergency department.   ROS: 10 point review of system has been reviewed and considered negative except ones been listed in the HPI.   Studies Reviewed: SABRA   EKG  Interpretation Date/Time:  Wednesday December 20 2023 13:28:41 EDT Ventricular Rate:  91 PR Interval:  164 QRS Duration:  78 QT Interval:  354 QTC Calculation: 435 R Axis:   31  Text Interpretation: Normal sinus rhythm T wave abnormalities more prononuced but no significant change When compared with ECG of 23-Nov-2021 17:02, Confirmed by Sheron Hallmark ([id number]) on 12/20/2023 2:32:03 PM   ECHO IMPRESSIONS 10/2023  1. Left ventricular ejection fraction, by estimation, is 60 to 65%. The  left ventricle has normal function. The left ventricle has no regional  wall motion abnormalities. There is mild left ventricular hypertrophy.  Left ventricular diastolic parameters  were normal.   2. Right ventricular systolic function is normal. The right ventricular  size is normal.   3. Trivial mitral valve regurgitation.   4. AV is thickened, calcified with mildly restricted motion. Peak and  mean gradients thruogh the valve are 34 and 18 mm Hg respectively AVA (VTI) is 1.33 cm2.      Dimensionless index is 0.46 Overall consistent with mild aortic  stenosis. . The aortic valve is tricuspid. Aortic valve regurgitation is  not visualized.  Comparison(s): The left ventricular function is unchanged.   Physical Exam:   VS:  BP (!) 160/80 (BP Location: Left Arm, Patient Position: Sitting, Cuff Size: Normal)   Pulse 92   Ht 5' 11 (1.803 m)   Wt 206 lb (93.4 kg)   SpO2 95%   BMI 28.73 kg/m    Wt Readings from Last 3 Encounters:  12/20/23 206 lb (93.4 kg)  10/25/23 212 lb 3.2  oz (96.3 kg)  10/17/23 210 lb 3.2 oz (95.3 kg)    GEN: Well nourished, well developed in no acute distress while sitting in chair.  NECK: No JVD; No carotid bruits CARDIAC: RRR, 3/6 systolic murmur in LUSB/RUSB RESPIRATORY:  Clear to auscultation without rales, wheezing or rhonchi  ABDOMEN: Soft, non-tender, non-distended EXTREMITIES:  No edema; No deformity   ASSESSMENT AND PLAN: .   Essential hypertension,  benign Home BP usually 130/90s and usually more elevated in medical offices BP this OV elevated 152/70 with repeat BP 160/80.  K 4.8 and Cr 1.57  in 08/2023.  Discussed optimal BP < 130/80 with history of diabetes.  Increase valsartan  to 320 mg daily (max dose).  Order Bmet in 1 to 2 weeks.  BP log over 4 weeks.  If BP remains elevated then would consider increasing Toprol  XL to 100 mg in the a.m. and 50 mg in the p.m. Continue on amlodipine  10 mg daily, Lozol  2.5 mg daily, Toprol -XL 100 mg daily  Encourage physical activity for 150 minutes per week and heart healthy low sodium diet. Discussed limiting sodium intake to < 2 grams daily.  Encouraged to decrease soda consumption and increase water  intake.   Hyperlipidemia LDL goal <100 LDL 48 in 08/2023 and LFT WNL 05/2023  Continue on Crestor  40 mg daily, fenofibrate  160 mg daily.   Paroxysmal atrial fibrillation (HCC) Previous isolated episode of afib during admission in 03/2019 in setting of COVID and DKA with conversion back to normal sinus rhythm and he did not have any recurrence by outpatient monitor. He denies any palpitations and no signs of A-fib on EKG today. Not on AC due to isolated event.  Type 2 diabetes mellitus without complication, unspecified whether long term insulin  use (HCC) A1C 9.7 in 08/2023 Encouraged low-carb diet. Continue to follow with Endocrinology   Mild Aortic Stenosis  ECHO 2021: Mild to moderate AV sclerosis/calcification without AS ECHO 10/2023 reviewed and showed mild AS. Noted 3/6 systolic murmur in RUSB/LUSB on exam.  Denies any syncope or worsening SOB.  Will plan to repeat ECHO in 1 year.     Dispo: Follow up in 02/2024 with Scottie, PA-C  Signed, Lorette CINDERELLA Kapur, PA-C

## 2023-12-20 ENCOUNTER — Ambulatory Visit: Attending: Student | Admitting: Physician Assistant

## 2023-12-20 ENCOUNTER — Encounter: Payer: Self-pay | Admitting: Physician Assistant

## 2023-12-20 VITALS — BP 160/80 | HR 92 | Ht 71.0 in | Wt 206.0 lb

## 2023-12-20 DIAGNOSIS — Z79899 Other long term (current) drug therapy: Secondary | ICD-10-CM | POA: Diagnosis not present

## 2023-12-20 DIAGNOSIS — I350 Nonrheumatic aortic (valve) stenosis: Secondary | ICD-10-CM | POA: Diagnosis not present

## 2023-12-20 DIAGNOSIS — I10 Essential (primary) hypertension: Secondary | ICD-10-CM | POA: Insufficient documentation

## 2023-12-20 DIAGNOSIS — E785 Hyperlipidemia, unspecified: Secondary | ICD-10-CM | POA: Insufficient documentation

## 2023-12-20 DIAGNOSIS — I480 Paroxysmal atrial fibrillation: Secondary | ICD-10-CM | POA: Diagnosis not present

## 2023-12-20 DIAGNOSIS — E119 Type 2 diabetes mellitus without complications: Secondary | ICD-10-CM | POA: Insufficient documentation

## 2023-12-20 MED ORDER — VALSARTAN 320 MG PO TABS: 320.0000 mg | ORAL_TABLET | Freq: Every day | ORAL | 3 refills | Status: AC

## 2023-12-20 MED ORDER — AMLODIPINE BESYLATE 10 MG PO TABS: 10.0000 mg | ORAL_TABLET | Freq: Every day | ORAL | Status: AC

## 2023-12-20 NOTE — Patient Instructions (Signed)
 Medication Instructions:   Increase Valsartan  to 320 mg Daily   Monitor Blood Pressure for 4 Weeks and drop off readings to office.   *If you need a refill on your cardiac medications before your next appointment, please call your pharmacy*  Lab Work: Your physician recommends that you return for lab work in: 1-2 Weeks ( BMET)   If you have labs (blood work) drawn today and your tests are completely normal, you will receive your results only by: MyChart Message (if you have MyChart) OR A paper copy in the mail If you have any lab test that is abnormal or we need to change your treatment, we will call you to review the results.  Testing/Procedures: NONE   Follow-Up: At [facility], you and your health needs are our priority.  As part of our continuing mission to provide you with exceptional heart care, our providers are all part of one team.  This team includes your primary Cardiologist (physician) and Advanced Practice Providers or APPs (Physician Assistants and Nurse Practitioners) who all work together to provide you with the care you need, when you need it.  Your next appointment:    December   Provider:   Lorette Kapur, PA-C     We recommend signing up for the patient portal called MyChart.  Sign up information is provided on this After Visit Summary.  MyChart is used to connect with patients for Virtual Visits (Telemedicine).  Patients are able to view lab/test results, encounter notes, upcoming appointments, etc.  Non-urgent messages can be sent to your provider as well.   To learn more about what you can do with MyChart, go to [email or web address].   Other Instructions Thank you for choosing [facility]!

## 2023-12-24 ENCOUNTER — Other Ambulatory Visit: Payer: Self-pay

## 2023-12-24 ENCOUNTER — Inpatient Hospital Stay (HOSPITAL_COMMUNITY)
Admission: EM | Admit: 2023-12-24 | Discharge: 2023-12-28 | DRG: 637 | Disposition: A | Discharge status: Home / Self Care | Attending: Internal Medicine | Admitting: Internal Medicine

## 2023-12-24 ENCOUNTER — Encounter (HOSPITAL_COMMUNITY): Payer: Self-pay | Admitting: Emergency Medicine

## 2023-12-24 ENCOUNTER — Emergency Department (HOSPITAL_COMMUNITY)

## 2023-12-24 ENCOUNTER — Inpatient Hospital Stay (HOSPITAL_COMMUNITY)

## 2023-12-24 DIAGNOSIS — M1990 Unspecified osteoarthritis, unspecified site: Secondary | ICD-10-CM | POA: Diagnosis present

## 2023-12-24 DIAGNOSIS — T68XXXA Hypothermia, initial encounter: Secondary | ICD-10-CM

## 2023-12-24 DIAGNOSIS — Z79899 Other long term (current) drug therapy: Secondary | ICD-10-CM | POA: Diagnosis not present

## 2023-12-24 DIAGNOSIS — N179 Acute kidney failure, unspecified: Secondary | ICD-10-CM | POA: Diagnosis present

## 2023-12-24 DIAGNOSIS — Z23 Encounter for immunization: Secondary | ICD-10-CM

## 2023-12-24 DIAGNOSIS — Z79891 Long term (current) use of opiate analgesic: Secondary | ICD-10-CM

## 2023-12-24 DIAGNOSIS — Z6830 Body mass index (BMI) 30.0-30.9, adult: Secondary | ICD-10-CM | POA: Diagnosis not present

## 2023-12-24 DIAGNOSIS — Z833 Family history of diabetes mellitus: Secondary | ICD-10-CM

## 2023-12-24 DIAGNOSIS — E1110 Type 2 diabetes mellitus with ketoacidosis without coma: Principal | ICD-10-CM | POA: Diagnosis present

## 2023-12-24 DIAGNOSIS — E860 Dehydration: Secondary | ICD-10-CM | POA: Diagnosis present

## 2023-12-24 DIAGNOSIS — J1289 Other viral pneumonia: Secondary | ICD-10-CM | POA: Diagnosis present

## 2023-12-24 DIAGNOSIS — K219 Gastro-esophageal reflux disease without esophagitis: Secondary | ICD-10-CM | POA: Diagnosis present

## 2023-12-24 DIAGNOSIS — R578 Other shock: Secondary | ICD-10-CM | POA: Diagnosis present

## 2023-12-24 DIAGNOSIS — Z9109 Other allergy status, other than to drugs and biological substances: Secondary | ICD-10-CM | POA: Diagnosis not present

## 2023-12-24 DIAGNOSIS — K760 Fatty (change of) liver, not elsewhere classified: Secondary | ICD-10-CM | POA: Diagnosis present

## 2023-12-24 DIAGNOSIS — E669 Obesity, unspecified: Secondary | ICD-10-CM | POA: Diagnosis present

## 2023-12-24 DIAGNOSIS — E1122 Type 2 diabetes mellitus with diabetic chronic kidney disease: Secondary | ICD-10-CM | POA: Diagnosis present

## 2023-12-24 DIAGNOSIS — E781 Pure hyperglyceridemia: Secondary | ICD-10-CM | POA: Diagnosis present

## 2023-12-24 DIAGNOSIS — E782 Mixed hyperlipidemia: Secondary | ICD-10-CM

## 2023-12-24 DIAGNOSIS — N1831 Chronic kidney disease, stage 3a: Secondary | ICD-10-CM | POA: Diagnosis present

## 2023-12-24 DIAGNOSIS — Z1152 Encounter for screening for COVID-19: Secondary | ICD-10-CM | POA: Diagnosis not present

## 2023-12-24 DIAGNOSIS — I129 Hypertensive chronic kidney disease with stage 1 through stage 4 chronic kidney disease, or unspecified chronic kidney disease: Secondary | ICD-10-CM | POA: Diagnosis present

## 2023-12-24 DIAGNOSIS — K858 Other acute pancreatitis without necrosis or infection: Secondary | ICD-10-CM | POA: Diagnosis not present

## 2023-12-24 DIAGNOSIS — I10 Essential (primary) hypertension: Secondary | ICD-10-CM | POA: Diagnosis present

## 2023-12-24 DIAGNOSIS — Z823 Family history of stroke: Secondary | ICD-10-CM

## 2023-12-24 DIAGNOSIS — K859 Acute pancreatitis without necrosis or infection, unspecified: Secondary | ICD-10-CM | POA: Diagnosis present

## 2023-12-24 DIAGNOSIS — J9601 Acute respiratory failure with hypoxia: Secondary | ICD-10-CM | POA: Diagnosis present

## 2023-12-24 DIAGNOSIS — Z87891 Personal history of nicotine dependence: Secondary | ICD-10-CM

## 2023-12-24 DIAGNOSIS — R571 Hypovolemic shock: Secondary | ICD-10-CM | POA: Diagnosis not present

## 2023-12-24 DIAGNOSIS — Z794 Long term (current) use of insulin: Secondary | ICD-10-CM

## 2023-12-24 DIAGNOSIS — Z7984 Long term (current) use of oral hypoglycemic drugs: Secondary | ICD-10-CM

## 2023-12-24 DIAGNOSIS — G8929 Other chronic pain: Secondary | ICD-10-CM | POA: Diagnosis present

## 2023-12-24 DIAGNOSIS — Z7982 Long term (current) use of aspirin: Secondary | ICD-10-CM

## 2023-12-24 DIAGNOSIS — R0602 Shortness of breath: Secondary | ICD-10-CM | POA: Diagnosis present

## 2023-12-24 DIAGNOSIS — R112 Nausea with vomiting, unspecified: Secondary | ICD-10-CM

## 2023-12-24 DIAGNOSIS — R0989 Other specified symptoms and signs involving the circulatory and respiratory systems: Secondary | ICD-10-CM

## 2023-12-24 DIAGNOSIS — Z8349 Family history of other endocrine, nutritional and metabolic diseases: Secondary | ICD-10-CM

## 2023-12-24 DIAGNOSIS — Z8249 Family history of ischemic heart disease and other diseases of the circulatory system: Secondary | ICD-10-CM

## 2023-12-24 LAB — URINALYSIS, ROUTINE W REFLEX MICROSCOPIC
Bacteria, UA: NONE SEEN
Bilirubin Urine: NEGATIVE
Glucose, UA: >=500 mg/dL — AB
Ketones, ur: 20 mg/dL — AB
Leukocytes,Ua: NEGATIVE
Nitrite: NEGATIVE
Protein, ur: 30 mg/dL — AB
Specific Gravity, Urine: 1.02 (ref 1.005–1.030)
pH: 5 (ref 5.0–8.0)

## 2023-12-24 LAB — BASIC METABOLIC PANEL WITH GFR
Anion gap: 21 — ABNORMAL HIGH (ref 5–15)
Anion gap: 21 — ABNORMAL HIGH (ref 5–15)
Anion gap: 18 — ABNORMAL HIGH (ref 5–15)
BUN: 43 mg/dL — ABNORMAL HIGH (ref 8–23)
BUN: 43 mg/dL — ABNORMAL HIGH (ref 8–23)
BUN: 45 mg/dL — ABNORMAL HIGH (ref 8–23)
BUN: 39 mg/dL — ABNORMAL HIGH (ref 8–23)
BUN: 32 mg/dL — ABNORMAL HIGH (ref 8–23)
BUN: 30 mg/dL — ABNORMAL HIGH (ref 8–23)
CO2: <7 mmol/L — ABNORMAL LOW (ref 22–32)
CO2: <7 mmol/L — ABNORMAL LOW (ref 22–32)
CO2: <7 mmol/L — ABNORMAL LOW (ref 22–32)
CO2: 12 mmol/L — ABNORMAL LOW (ref 22–32)
CO2: 16 mmol/L — ABNORMAL LOW (ref 22–32)
CO2: 17 mmol/L — ABNORMAL LOW (ref 22–32)
Calcium: 9.4 mg/dL (ref 8.9–10.3)
Calcium: 8.2 mg/dL — ABNORMAL LOW (ref 8.9–10.3)
Calcium: 8.3 mg/dL — ABNORMAL LOW (ref 8.9–10.3)
Calcium: 8.2 mg/dL — ABNORMAL LOW (ref 8.9–10.3)
Calcium: 8.8 mg/dL — ABNORMAL LOW (ref 8.9–10.3)
Calcium: 8.3 mg/dL — ABNORMAL LOW (ref 8.9–10.3)
Chloride: 78 mmol/L — ABNORMAL LOW (ref 98–111)
Chloride: 83 mmol/L — ABNORMAL LOW (ref 98–111)
Chloride: 84 mmol/L — ABNORMAL LOW (ref 98–111)
Chloride: 94 mmol/L — ABNORMAL LOW (ref 98–111)
Chloride: 94 mmol/L — ABNORMAL LOW (ref 98–111)
Chloride: 96 mmol/L — ABNORMAL LOW (ref 98–111)
Creatinine, Ser: 3.21 mg/dL — ABNORMAL HIGH (ref 0.61–1.24)
Creatinine, Ser: 2.98 mg/dL — ABNORMAL HIGH (ref 0.61–1.24)
Creatinine, Ser: 3.1 mg/dL — ABNORMAL HIGH (ref 0.61–1.24)
Creatinine, Ser: 2.7 mg/dL — ABNORMAL HIGH (ref 0.61–1.24)
Creatinine, Ser: 2.13 mg/dL — ABNORMAL HIGH (ref 0.61–1.24)
Creatinine, Ser: 1.9 mg/dL — ABNORMAL HIGH (ref 0.61–1.24)
GFR, Estimated: 21 mL/min — ABNORMAL LOW (ref >60)
GFR, Estimated: 22 mL/min — ABNORMAL LOW (ref >60)
GFR, Estimated: 23 mL/min — ABNORMAL LOW (ref >60)
GFR, Estimated: 26 mL/min — ABNORMAL LOW (ref >60)
GFR, Estimated: 34 mL/min — ABNORMAL LOW (ref >60)
GFR, Estimated: 39 mL/min — ABNORMAL LOW (ref >60)
Glucose, Bld: 1091 mg/dL (ref 70–99)
Glucose, Bld: 1021 mg/dL (ref 70–99)
Glucose, Bld: 1033 mg/dL (ref 70–99)
Glucose, Bld: 580 mg/dL (ref 70–99)
Glucose, Bld: 319 mg/dL — ABNORMAL HIGH (ref 70–99)
Glucose, Bld: 261 mg/dL — ABNORMAL HIGH (ref 70–99)
Potassium: 4.7 mmol/L (ref 3.5–5.1)
Potassium: 4.9 mmol/L (ref 3.5–5.1)
Potassium: 5.1 mmol/L (ref 3.5–5.1)
Potassium: 4.4 mmol/L (ref 3.5–5.1)
Potassium: 3.6 mmol/L (ref 3.5–5.1)
Potassium: 3.4 mmol/L — ABNORMAL LOW (ref 3.5–5.1)
Sodium: 120 mmol/L — ABNORMAL LOW (ref 135–145)
Sodium: 121 mmol/L — ABNORMAL LOW (ref 135–145)
Sodium: 122 mmol/L — ABNORMAL LOW (ref 135–145)
Sodium: 127 mmol/L — ABNORMAL LOW (ref 135–145)
Sodium: 131 mmol/L — ABNORMAL LOW (ref 135–145)
Sodium: 131 mmol/L — ABNORMAL LOW (ref 135–145)

## 2023-12-24 LAB — CBC WITH DIFFERENTIAL/PLATELET
Abs Immature Granulocytes: 0.6 K/uL — ABNORMAL HIGH (ref 0.00–0.07)
Band Neutrophils: 5 %
Basophils Absolute: 0 K/uL (ref 0.0–0.1)
Basophils Relative: 0 %
Eosinophils Absolute: 0 K/uL (ref 0.0–0.5)
Eosinophils Relative: 0 %
HCT: 40 % (ref 39.0–52.0)
Hemoglobin: 13.6 g/dL (ref 13.0–17.0)
Lymphocytes Relative: 19 %
Lymphs Abs: 5.3 K/uL — ABNORMAL HIGH (ref 0.7–4.0)
MCH: 31.3 pg (ref 26.0–34.0)
MCHC: 34 g/dL (ref 30.0–36.0)
MCV: 92.2 fL (ref 80.0–100.0)
Metamyelocytes Relative: 1 %
Monocytes Absolute: 1.7 K/uL — ABNORMAL HIGH (ref 0.1–1.0)
Monocytes Relative: 6 %
Myelocytes: 1 %
Neutro Abs: 20.3 K/uL — ABNORMAL HIGH (ref 1.7–7.7)
Neutrophils Relative %: 68 %
Platelets: 247 K/uL (ref 150–400)
RBC: 4.34 MIL/uL (ref 4.22–5.81)
RDW: 12.8 % (ref 11.5–15.5)
Smear Review: NORMAL
WBC: 27.8 K/uL — ABNORMAL HIGH (ref 4.0–10.5)
nRBC: 0 % (ref 0.0–0.2)

## 2023-12-24 LAB — BLOOD GAS, VENOUS
Acid-base deficit: 26.5 mmol/L — ABNORMAL HIGH (ref 0.0–2.0)
Acid-base deficit: 22 mmol/L — ABNORMAL HIGH (ref 0.0–2.0)
Bicarbonate: 4.5 mmol/L — ABNORMAL LOW (ref 20.0–28.0)
Bicarbonate: 6.8 mmol/L — ABNORMAL LOW (ref 20.0–28.0)
O2 Saturation: 85.5 %
O2 Saturation: 49 %
Patient temperature: 33.8
Patient temperature: 35
pCO2, Ven: 18 mmHg — CL (ref 44–60)
pCO2, Ven: 22 mmHg — ABNORMAL LOW (ref 44–60)
pH, Ven: 6.98 — CL (ref 7.25–7.43)
pH, Ven: 7.08 — CL (ref 7.25–7.43)
pO2, Ven: 53 mmHg — ABNORMAL HIGH (ref 32–45)
pO2, Ven: <31 mmHg — CL (ref 32–45)

## 2023-12-24 LAB — BRAIN NATRIURETIC PEPTIDE: B Natriuretic Peptide: 112 pg/mL — ABNORMAL HIGH (ref 0.0–100.0)

## 2023-12-24 LAB — TROPONIN I (HIGH SENSITIVITY)
Troponin I (High Sensitivity): 53 ng/L — ABNORMAL HIGH (ref <18)
Troponin I (High Sensitivity): 295 ng/L (ref <18)

## 2023-12-24 LAB — LACTIC ACID, PLASMA
Lactic Acid, Venous: >9 mmol/L (ref 0.5–1.9)
Lactic Acid, Venous: 8.6 mmol/L (ref 0.5–1.9)

## 2023-12-24 LAB — GLUCOSE, CAPILLARY
Glucose-Capillary: >600 mg/dL (ref 70–99)
Glucose-Capillary: >600 mg/dL (ref 70–99)
Glucose-Capillary: 596 mg/dL (ref 70–99)
Glucose-Capillary: 598 mg/dL (ref 70–99)
Glucose-Capillary: 508 mg/dL (ref 70–99)
Glucose-Capillary: 404 mg/dL — ABNORMAL HIGH (ref 70–99)
Glucose-Capillary: 334 mg/dL — ABNORMAL HIGH (ref 70–99)
Glucose-Capillary: 440 mg/dL — ABNORMAL HIGH (ref 70–99)
Glucose-Capillary: 282 mg/dL — ABNORMAL HIGH (ref 70–99)
Glucose-Capillary: 308 mg/dL — ABNORMAL HIGH (ref 70–99)
Glucose-Capillary: 265 mg/dL — ABNORMAL HIGH (ref 70–99)
Glucose-Capillary: 278 mg/dL — ABNORMAL HIGH (ref 70–99)

## 2023-12-24 LAB — LIPASE, BLOOD: Lipase: 2740 U/L — ABNORMAL HIGH (ref 11–51)

## 2023-12-24 LAB — CBG MONITORING, ED
Glucose-Capillary: >600 mg/dL (ref 70–99)
Glucose-Capillary: >600 mg/dL (ref 70–99)
Glucose-Capillary: >600 mg/dL (ref 70–99)
Glucose-Capillary: >600 mg/dL (ref 70–99)
Glucose-Capillary: >600 mg/dL (ref 70–99)
Glucose-Capillary: >600 mg/dL (ref 70–99)
Glucose-Capillary: >600 mg/dL (ref 70–99)

## 2023-12-24 LAB — MAGNESIUM: Magnesium: 2 mg/dL (ref 1.7–2.4)

## 2023-12-24 LAB — BETA-HYDROXYBUTYRIC ACID
Beta-Hydroxybutyric Acid: >8 mmol/L — ABNORMAL HIGH (ref 0.05–0.27)
Beta-Hydroxybutyric Acid: >8 mmol/L — ABNORMAL HIGH (ref 0.05–0.27)
Beta-Hydroxybutyric Acid: 7.58 mmol/L — ABNORMAL HIGH (ref 0.05–0.27)
Beta-Hydroxybutyric Acid: 4.41 mmol/L — ABNORMAL HIGH (ref 0.05–0.27)

## 2023-12-24 LAB — PROTIME-INR
INR: 1.1 (ref 0.8–1.2)
Prothrombin Time: 15 s (ref 11.4–15.2)

## 2023-12-24 LAB — RESP PANEL BY RT-PCR (RSV, FLU A&B, COVID)  RVPGX2
Influenza A by PCR: NEGATIVE
Influenza B by PCR: NEGATIVE
Resp Syncytial Virus by PCR: NEGATIVE
SARS Coronavirus 2 by RT PCR: NEGATIVE

## 2023-12-24 LAB — MRSA NEXT GEN BY PCR, NASAL: MRSA by PCR Next Gen: NOT DETECTED

## 2023-12-24 LAB — PHOSPHORUS: Phosphorus: <1 mg/dL — CL (ref 2.5–4.6)

## 2023-12-24 MED ORDER — LACTATED RINGERS IV SOLN: INTRAVENOUS | Status: DC

## 2023-12-24 MED ORDER — OXYCODONE-ACETAMINOPHEN 5-325 MG PO TABS
1.0000 | ORAL_TABLET | Freq: Three times a day (TID) | ORAL | Status: DC | PRN
  Administered 2023-12-24 – 2023-12-28 (×5): 1 via ORAL
  Filled 2023-12-24 (×5): qty 1

## 2023-12-24 MED ORDER — ONDANSETRON HCL 4 MG/2ML IJ SOLN
4.0000 mg | Freq: Once | INTRAMUSCULAR | Status: AC
  Administered 2023-12-24: 4 mg via INTRAVENOUS
  Filled 2023-12-24: qty 2

## 2023-12-24 MED ORDER — POTASSIUM PHOSPHATES 15 MMOLE/5ML IV SOLN
30.0000 mmol | Freq: Once | INTRAVENOUS | Status: AC
  Administered 2023-12-24: 30 mmol via INTRAVENOUS
  Filled 2023-12-24: qty 10

## 2023-12-24 MED ORDER — ONDANSETRON HCL 4 MG/2ML IJ SOLN
INTRAMUSCULAR | Status: AC
  Filled 2023-12-24: qty 2

## 2023-12-24 MED ORDER — HEPARIN SODIUM (PORCINE) 5000 UNIT/ML IJ SOLN
5000.0000 [IU] | Freq: Three times a day (TID) | INTRAMUSCULAR | Status: DC
  Administered 2023-12-24 – 2023-12-28 (×12): 5000 [IU] via SUBCUTANEOUS
  Filled 2023-12-24 (×12): qty 1

## 2023-12-24 MED ORDER — PANTOPRAZOLE SODIUM 40 MG IV SOLR
40.0000 mg | Freq: Once | INTRAVENOUS | Status: AC
  Administered 2023-12-24: 40 mg via INTRAVENOUS
  Filled 2023-12-24: qty 10

## 2023-12-24 MED ORDER — DEXTROSE IN LACTATED RINGERS 5 % IV SOLN
INTRAVENOUS | Status: AC
Start: 2023-12-24 — End: 2023-12-25

## 2023-12-24 MED ORDER — SODIUM CHLORIDE 0.9 % IV SOLN
2.0000 g | Freq: Once | INTRAVENOUS | Status: AC
  Administered 2023-12-24: 2 g via INTRAVENOUS
  Filled 2023-12-24: qty 12.5

## 2023-12-24 MED ORDER — DEXTROSE IN LACTATED RINGERS 5 % IV SOLN
INTRAVENOUS | Status: DC
Start: 2023-12-24 — End: 2023-12-24

## 2023-12-24 MED ORDER — METOCLOPRAMIDE HCL 5 MG/ML IJ SOLN
10.0000 mg | Freq: Once | INTRAMUSCULAR | Status: AC
Start: 2023-12-24 — End: 2023-12-24
  Administered 2023-12-24: 10 mg via INTRAVENOUS
  Filled 2023-12-24: qty 2

## 2023-12-24 MED ORDER — CHLORHEXIDINE GLUCONATE CLOTH 2 % EX PADS
6.0000 | MEDICATED_PAD | Freq: Every day | CUTANEOUS | Status: DC
  Administered 2023-12-24 – 2023-12-27 (×3): 6 via TOPICAL

## 2023-12-24 MED ORDER — POTASSIUM CHLORIDE 10 MEQ/100ML IV SOLN
10.0000 meq | INTRAVENOUS | Status: AC
  Administered 2023-12-24 (×2): 10 meq via INTRAVENOUS
  Filled 2023-12-24 (×2): qty 100

## 2023-12-24 MED ORDER — INSULIN REGULAR(HUMAN) IN NACL 100-0.9 UT/100ML-% IV SOLN
INTRAVENOUS | Status: DC
  Administered 2023-12-24: 17 [IU]/h via INTRAVENOUS
  Administered 2023-12-24: 14 [IU]/h via INTRAVENOUS
  Administered 2023-12-24: 11.5 [IU]/h via INTRAVENOUS
  Administered 2023-12-25: 16 [IU]/h via INTRAVENOUS
  Administered 2023-12-25: 15 [IU]/h via INTRAVENOUS
  Administered 2023-12-25: 13 [IU]/h via INTRAVENOUS
  Administered 2023-12-26: 14 [IU]/h via INTRAVENOUS
  Filled 2023-12-24 (×7): qty 100

## 2023-12-24 MED ORDER — ONDANSETRON HCL 4 MG/2ML IJ SOLN
4.0000 mg | Freq: Once | INTRAMUSCULAR | Status: AC
  Administered 2023-12-24: 4 mg via INTRAVENOUS

## 2023-12-24 MED ORDER — PROCHLORPERAZINE EDISYLATE 10 MG/2ML IJ SOLN
10.0000 mg | INTRAMUSCULAR | Status: DC | PRN
  Administered 2023-12-24 – 2023-12-26 (×4): 10 mg via INTRAVENOUS
  Filled 2023-12-24 (×7): qty 2

## 2023-12-24 MED ORDER — SODIUM CHLORIDE 0.9 % IV BOLUS
2000.0000 mL | Freq: Once | INTRAVENOUS | Status: AC
Start: 2023-12-24 — End: 2023-12-25
  Administered 2023-12-24: 2000 mL via INTRAVENOUS

## 2023-12-24 MED ORDER — SODIUM BICARBONATE 8.4 % IV SOLN
50.0000 meq | Freq: Once | INTRAVENOUS | Status: AC
  Administered 2023-12-24: 50 meq via INTRAVENOUS
  Filled 2023-12-24: qty 50

## 2023-12-24 MED ORDER — NOREPINEPHRINE 4 MG/250ML-% IV SOLN
0.0000 ug/min | INTRAVENOUS | Status: DC
Start: 2023-12-24 — End: 2023-12-24
  Filled 2023-12-24: qty 250

## 2023-12-24 MED ORDER — ALUM & MAG HYDROXIDE-SIMETH 200-200-20 MG/5ML PO SUSP
30.0000 mL | Freq: Once | ORAL | Status: AC
  Administered 2023-12-24: 30 mL via ORAL
  Filled 2023-12-24 (×2): qty 30

## 2023-12-24 MED ORDER — DEXTROSE 50 % IV SOLN: 0.0000 mL | INTRAVENOUS | Status: DC | PRN

## 2023-12-24 MED ORDER — SODIUM CHLORIDE 0.9 % IV BOLUS
1000.0000 mL | INTRAVENOUS | Status: AC
  Administered 2023-12-24 (×2): 1000 mL via INTRAVENOUS

## 2023-12-24 MED ORDER — OXYCODONE HCL 5 MG PO TABS
5.0000 mg | ORAL_TABLET | Freq: Three times a day (TID) | ORAL | Status: DC | PRN
Start: 2023-12-24 — End: 2023-12-28
  Administered 2023-12-26 – 2023-12-28 (×3): 5 mg via ORAL
  Filled 2023-12-24 (×3): qty 1

## 2023-12-24 MED ORDER — LACTATED RINGERS IV BOLUS: 20.0000 mL/kg | Freq: Once | INTRAVENOUS | Status: DC

## 2023-12-24 NOTE — Progress Notes (Signed)
 eLink Physician-Brief Progress Note Patient Name: Terry [name] DOB: 1960-04-24 MRN: [id number]   Date of Service  12/24/2023  HPI/Events of Note  Notified of Phos < 1 No other labs repeated tonight Ongoing insulin  drip Nauseous despite compazine  and zofran , BSRN requesting Reglan  Abdomen disteded and tender with no appreciated bowel sounds on bedside exam  eICU Interventions  BMP ordered stat Kphos 30 mmol ordered One time dose of Regaln KUB stat eLink to be informed once with results     Intervention Category Intermediate Interventions: Electrolyte abnormality - evaluation and management  [name] 12/24/2023, 8:11 PM

## 2023-12-24 NOTE — H&P (Addendum)
 NAME:  Terry [name], MRN:  [id number], DOB:  [birth year], LOS: 0 ADMISSION DATE:  12/24/2023, CONSULTATION DATE:  12/24/2023 REFERRING MD:  TRH, CHIEF COMPLAINT: DKA  History of Present Illness:  [name] is a 63 year old male with a past medical history significant for type 2 diabetes, HTN, HLD, GERD, and osteoarthritis who presented to the ED at [facility] via private vehicle with complaints of nausea and vomiting that began 1 day prior to admission.  Also reports shortness of breath that began the evening of admission.  Denies any cough, fever, chills but does also endorse increased thirst.  On ED arrival patient was seen hypothermic, tachycardic, tachypneic, and mildly hypotensive  .  Lab work significant for NA 120, chloride 78, CO2 less than 7, glucose 1091, creatinine 3.21, GFR 21, WBC 27.8, beta-hydroxybutyrate acid greater than 8, high-sensitivity troponin 53, and lactic acid greater than 9.  Given concern for septic shock with associated DKA patient was transferred to [facility] for admission under critical care.  At arrival to [facility] ICU - he  was off bipap. RR 30, feeling much better. Says he had to stop dexcom mo itoring 2 weeks ago becusae of waiting for [employer] approval. Denies fever, chills, non-compliance. REports this is 2nd episode in life - last was a year ago.   Pertinent  Medical History   type 2 diabetes, HTN, HLD, GERD, and osteoarthritis    has a past medical history of Diabetes mellitus without complication (HCC) (2004), GERD (gastroesophageal reflux disease), Hyperlipidemia, Hypertension, and Osteoarthritis.   reports that he quit smoking about 31 years ago. His smoking use included cigarettes. He has never used smokeless tobacco.  Past Surgical History:  Procedure Laterality Date   ANKLE SURGERY Left 03/28/2005   2 plates/screws   COLONOSCOPY N/A 08/04/2017   Procedure: COLONOSCOPY;  Surgeon: Golda Claudis PENNER, MD;  [facility];  Service: Endoscopy;  Laterality: N/A;   ESOPHAGEAL DILATION N/A 08/04/2017   Procedure: ESOPHAGEAL DILATION;  Surgeon: Golda Claudis PENNER, MD;  Location: [facility];  Service: Endoscopy;  Laterality: N/A;   ESOPHAGOGASTRODUODENOSCOPY N/A 08/04/2017   Procedure: ESOPHAGOGASTRODUODENOSCOPY (EGD);  Surgeon: Golda Claudis PENNER, MD;  Location: [facility];  Service: Endoscopy;  Laterality: N/A;   POLYPECTOMY  08/04/2017   Procedure: POLYPECTOMY;  Surgeon: Golda Claudis PENNER, MD;  Location: [facility];  Service: Endoscopy;;   ROOT CANAL      Allergies  Allergen Reactions   Invokana  [Canagliflozin ] Other (See Comments)    laryngitis   Trulicity  [Dulaglutide ] Swelling    Patient also did not tolerate because of blistering in the mouth would avoid all GLP-1's patient was hospitalized with diabetic ketoacidosis August 2023   Altace [Ramipril] Cough   Metformin  And Related Diarrhea    Immunization History  Administered Date(s) Administered   Influenza, Seasonal, Injecte, Preservative Fre 12/20/2022   Influenza,inj,Quad PF,6+ Mos 01/30/2014, 01/29/2016, 01/30/2017, 01/26/2018, 01/23/2019, 01/17/2020, 01/01/2021   Influenza-Unspecified 01/26/2012, 02/21/2015, 01/10/2022   PNEUMOCOCCAL CONJUGATE-20 03/21/2023   Pneumococcal Polysaccharide-23 01/30/2014   Td 12/19/2014    Family History  Problem Relation Age of Onset   Hypertension Mother    Diabetes Mother    Heart disease Mother    Hyperlipidemia Mother    Cancer Father        lung     Current Facility-Administered Medications:    alum & mag hydroxide-simeth (MAALOX/MYLANTA) 200-200-20 MG/5ML suspension 30 mL, 30 mL, Oral, Once, Geronimo Amel, MD  dextrose  5 % in lactated ringers  infusion, , Intravenous, Continuous, Naasz, Sid SAILOR, MD   dextrose  5 % in lactated ringers  infusion, , Intravenous, Continuous, Tannen Vandezande, MD   dextrose  50 % solution 0-50 mL, 0-50 mL, Intravenous, PRN, Franklyn Sid SAILOR, MD   insulin   regular, human (MYXREDLIN ) 100 units/ 100 mL infusion, , Intravenous, Continuous, Franklyn Sid SAILOR, MD, Last Rate: 11.5 mL/hr at 12/24/23 0749, 11.5 Units/hr at 12/24/23 0749   lactated ringers  bolus 1,996 mL, 20 mL/kg, Intravenous, Once, Geronimo Amel, MD   lactated ringers  infusion, , Intravenous, Continuous, Franklyn Sid SAILOR, MD, Last Rate: 125 mL/hr at 12/24/23 0750, New Bag at 12/24/23 0750   lactated ringers  infusion, , Intravenous, Continuous, Simran Mannis, MD   norepinephrine (LEVOPHED) 4mg  in (0.016 mg/mL) premix infusion, 0-40 mcg/min, Intravenous, Titrated, Franklyn Sid SAILOR, MD   ondansetron  (ZOFRAN ) injection 4 mg, 4 mg, Intravenous, Once, Wane Mollett, MD   pantoprazole  (PROTONIX ) injection 40 mg, 40 mg, Intravenous, Once, Franklyn Sid SAILOR, MD   sodium chloride  0.9 % bolus 2,000 mL, 2,000 mL, Intravenous, Once, Geronimo Amel, MD    Significant Hospital Events: Including procedures, antibiotic start and stop dates in addition to other pertinent events   9/28 presented with complaints of nausea, vomiting, increased thirst and shortness of breath workup revealed concern for evolving septic shock and DKA At arrival to [facility] ICU - he  was off bipap. RR 30, feeling much better. Says he had to stop dexcom mo itoring 2 weeks ago becusae of waiting for [employer] approval. Denies fever, chills, non-compliance  DKA parametes improved Denies CAD, stroke, Cancers, lung disease, smoking  Interim History / Subjective:  9/28 ICU as above  Objective    Blood pressure (!) 101/48, pulse (!) 115, temperature (!) 95 F (35 C), temperature source Rectal, resp. rate (!) 43, height 5' 11 (1.803 m), weight 99.8 kg, SpO2 94%.    FiO2 (%):  [35 %] 35 % PEEP:  [6 cmH20] 6 cmH20   Intake/Output Summary (Last 24 hours) at 12/24/2023 1019 Last data filed at 12/24/2023 0939 Gross per 24 hour  Intake 1050 ml  Output --  Net 1050 ml   Filed Weights   12/24/23 0643   Weight: 99.8 kg    Examination: General Appearance:  Looks better than described from EDP. Off BiPAP Head:  Normocephalic, without obvious abnormality, atraumatic Eyes:  PERRL - yes, conjunctiva/corneas - mudd     Ears:  Normal external ear canals, both ears Nose:  G tube - no Throat:  ETT TUBE - no , OG tube - no Neck:  Supple,  No enlargement/tenderness/nodules Lungs: Clear to auscultation bilaterally,  Heart:  S1 and S2 normal, no murmur, CVP - no.  Pressors - no Abdomen:  Soft, no masses, no organomegaly Genitalia / Rectal:  Not done Extremities:  Extremities- intact Skin:  ntact in exposed areas . Sacral area - not examied Neurologic:  Sedation - none -> RASS - +! SABRA Moves all 4s - yes. CAM-ICU - neg . Orientation - x3+     Resolved problem list   Assessment and Plan  Diabetic ketoacidosis with history of type 2 diabetes SEvere Criritically ILL DKA - at admit due to lack of DexCOm monitoring Poorlyu controlled DM - Patient presented with pH 6.9, CO2 undetectable, anion gap undetectable, slightly altered mental status meeting criteria for severe DKA.  Additionally beta-hydroxybutyrate uric acid greater than 8 -Hemoglobin A1c 9.7 6/20   Plan  DKA protocol  Circulatory SHock at arrival to ER due to dehydration from DKA   0- 9/28 resolved with fluids by time of arrival to   P: Aggressive IV hydration Pressors if needed  for MAP goal greater than 65   Vomit at aadmit - liklely due to dKA  Plan   - compazine  prn - check lipase  - monitor    Acute Hypoxic Respiratory Failure in setting of severe DKA - - Patient reports significant shortness of breath with increased work of breathing in the setting of DKA.  ABG with pH 6.99, pCO2 18, O2 53, bicarb 4.5. CLEAR CXS  - likely all kussmaul. Was able to come off bipap at atime of arival to 82m08  P: Aspiration precautions N.p.o. -> upgrade to clear liquid diet   Acute Kidney Injury superimposed on CKD  stage IIIa - Creatinine on admission 3.21 with GFR 21 compared to creatinine 1.57 with GFR 56/13/25  P: Monitor with DKA protocll  Pseudohyponatremia At high risk for Low Mag an dLow Phos - sodium per BMP 122 but corrected for hyperglycemia sodium ranges between 137-144 P: Management per DKA as above Monitor and replete lytes accordingly  Essential hypertension Hyperlipidemia - Location reconciliation not yet completed but it appears home medications include Norvasc , aspirin , Zetia , fibrate, Toprol -XL and Crestor  losartan   Recent ECHO ok  P: Hold home medications acutely Continuous telemetry Cycle Cardiac Enzymes   Chronic Pain on Oxycodone   Plan  - restart oxycodeone at his request  ATTESTATION & SIGNATURE   The patient Marl Seago is critically ill with multiple organ systems failure and requires high complexity decision making for assessment and support, frequent evaluation and titration of therapies, application of advanced monitoring technologies and extensive interpretation of multiple databases and discussion with other appropriate health care personnel such as bedside nurses, social workers, case Production designer, theatre/television/film, consultants, respiratory therapists, nutritionists, secretaries etc.,  Critical care time includes but is not restricted to just documentation time. Documentation can happen in parallel or sequential to care time depending on case mix urgency and priorities for the shift. So, overall critical Care Time devoted to patient care services described in this note is  60  Minutes.   This time reflects time of care of this signee Dr Dorethia Cave which includ does not reflect procedure time, or teaching time or supervisory time of PA/NP/Med student/Med Resident etc but could involve care discussion time     Dr. Dorethia Cave, M.D., [facility].C.P Pulmonary and Critical Care Medicine Staff Physician, [facility] [place] [facility]monary and Critical Care Pager: [phone], If no answer or between  15:00h - 7:00h: call [phone]  12/24/2023 12:18 PM    LABS    PULMONARY Recent Labs  Lab 12/24/23 0709 12/24/23 1010  HCO3 4.5* 6.8*  O2SAT 85.5 49    CBC Recent Labs  Lab 12/24/23 0625  HGB 13.6  HCT 40.0  WBC 27.8*  PLT 247    COAGULATION Recent Labs  Lab 12/24/23 0840  INR 1.1    CARDIAC  No results for input(s): TROPONINI in the last 168 hours. No results for input(s): PROBNP in the last 168 hours.   CHEMISTRY Recent Labs  Lab 12/24/23 0625 12/24/23 0933 12/24/23 0951  NA 120* 122* 121*  K 4.7 4.9 5.1  CL 78* 84* 83*  CO2 <7* <7* <7*  GLUCOSE 1,091* 1,021* 1,033*  BUN 43* 43* 45*  CREATININE 3.21* 2.98* 3.10*  CALCIUM  9.4 8.3* 8.2*  Estimated Creatinine Clearance: 29.7 mL/min (A) (by C-G formula based on SCr of 3.1 mg/dL (H)).   LIVER Recent Labs  Lab 12/24/23 0840  INR 1.1     INFECTIOUS Recent Labs  Lab 12/24/23 0840 12/24/23 0933  LATICACIDVEN >9.0* 8.6*     ENDOCRINE CBG (last 3)  Recent Labs    12/24/23 1011 12/24/23 1056 12/24/23 1204  GLUCAP >600* >600* >600*         IMAGING x48h  - image(s) personally visualized  -   highlighted in bold DG Chest Portable 1 View Result Date: 12/24/2023 CLINICAL DATA:  63 year old male with abnormal pulmonary auscultation. Shortness of breath. EXAM: PORTABLE CHEST 1 VIEW COMPARISON:  Portable chest 0727 hours today and earlier. FINDINGS: Portable AP view at 0953 hours. Unchanged lung volumes, mediastinal contours which remain within normal limits. Visualized tracheal air column is within normal limits. Chronic symmetric pulmonary interstitial markings appear stable from 2023. No pneumothorax, pleural effusion or confluent lung opacity. Paucity of bowel gas. Stable visualized osseous structures. IMPRESSION: Stable, No acute cardiopulmonary abnormality. Electronically Signed   By: VEAR Hurst M.D.   On: 12/24/2023 10:36   DG Chest Portable 1  View Result Date: 12/24/2023 EXAM: 1 VIEW(S) XRAY OF THE CHEST 12/24/2023 07:44:24 AM COMPARISON: 11/23/21 CLINICAL HISTORY: SOB. Per Triage: Pt arrives via POV from home with c/o N/V starting yesterday and SHOB this evening. Hx type 2 diabetic. Pt tachypneic, thirsty. States last insulin  dose was at lunch time. BGL reading high. FINDINGS: LUNGS AND PLEURA: Low lung volumes. No focal pulmonary opacity. No pulmonary edema. No pleural effusion. No pneumothorax. HEART AND MEDIASTINUM: No acute abnormality of the cardiac and mediastinal silhouettes. BONES AND SOFT TISSUES: No acute osseous abnormality. IMPRESSION: 1. No acute cardiopulmonary process. 2. Low lung volumes. Electronically signed by: Waddell Calk MD 12/24/2023 09:09 AM EDT RP Workstation: [id number]

## 2023-12-24 NOTE — Consult Note (Signed)
  Terry [name]  - telephone call vai Care link to PCCM team in [place]  S: NV and dyspnea Hypothermic  Ojb  Ph < 6.9 Gluc > 1000 BHOB > 8 Lactate > 9 RR 50  Never hypoxemic  ECHO 6 weks ago normal EFT   Says after 2.5L fluids - got wet with crackles -> and started on BiPAP -> RR 30s and feels better EDP unable to get out of BiPAP     has a past medical history of Diabetes mellitus without complication (HCC) (2004), GERD (gastroesophageal reflux disease), Hyperlipidemia, Hypertension, and Osteoarthritis.   has a past surgical history that includes Ankle surgery (Left, 03/28/2005); Esophagogastroduodenoscopy (N/A, 08/04/2017); Esophageal dilation (N/A, 08/04/2017); Colonoscopy (N/A, 08/04/2017); polypectomy (08/04/2017); and Root canal.   A SEvere DKA REsp distress - BiPAP helping Question of CHF or Not Questio if more fluid or not  Plan Add on BNP, ABG/VBG, repeat CXR Accept [place] [facility] ICU Cotn[place]ue BiPAP     SIGNATURE    Dr. Dorethia Cave, M.D., F.C.C.P,  Pulmonary and Critical Care Medicine Staff Physician, [facility] Director - Interstitial Lung Disease  Program  Pulmonary Fibrosis [facility] [place], [place], [address]   Pager: [phone], If no answer  -> Check AMION or Try [phone] Telephone (clinical office): [phone] Telephone (research): [phone]  9:51 AM 12/24/2023

## 2023-12-24 NOTE — Sepsis Progress Note (Signed)
 Confirmed with bedside nurse that blood cultures were drawn prior to antibiotics being given.

## 2023-12-24 NOTE — Progress Notes (Signed)
 eLink Physician-Brief Progress Note Patient Name: [name] DOB: 21-Oct-1960 MRN: [id number]   Date of Service  12/24/2023  HPI/Events of Note  KUB negative for acute findings. Gas pattern normal BSRN reports abdomen distended and diffusely tender  eICU Interventions  Ordered CT abdomen and pelvis Discussed with BSRN     Intervention Category Intermediate Interventions: Pain - evaluation and management;Communication with other healthcare providers and/or family  [name] 12/24/2023, 11:57 PM

## 2023-12-24 NOTE — Sepsis Progress Note (Signed)
 Sepsis protocol is being followed by eLink.

## 2023-12-24 NOTE — ED Triage Notes (Addendum)
 Pt arrives via POV from home with c/o N/V starting yesterday and SHOB this evening. Hx type 2 diabetic. Pt tachypneic, thirsty. States last insulin  dose was at lunch time. BGL reading high.

## 2023-12-24 NOTE — ED Provider Notes (Signed)
 [place] [facility] AT [facility] Provider Note   CSN: [id number] Arrival date & time: 12/24/23  9372     History  Chief Complaint  Patient presents with   Shortness of Breath   Nausea    Terry [name] is a 63 y.o. male with PMH as listed below who presents with N/V starting yesterday and SHOB this evening. Unable to keep anything down. History provided by patient and his wife. He states he feels horrible. Hx type 2 diabetic. Pt also SOB with no cough or fevers/chills and is thirsty for ice water . He also endorses some chest pain. He denies urinary sxs, wounds, leg swelling, falls, head trauma, sick contacts, abdominal pain. States last insulin  dose was at lunch time. BGL reading high.   Past Medical History:  Diagnosis Date   Diabetes mellitus without complication (HCC) 2004   GERD (gastroesophageal reflux disease)    Hyperlipidemia    Hypertension    Osteoarthritis        Home Medications Prior to Admission medications   Medication Sig Start Date End Date Taking? Authorizing Provider  amLODipine  (NORVASC ) 10 MG tablet Take 1 tablet (10 mg total) by mouth daily. 12/20/23   Sheron Lorette GRADE, PA-C  aspirin  EC 81 MG tablet Take 1 tablet (81 mg total) by mouth daily with breakfast. 04/10/19   Pearlean Manus, MD  Continuous Glucose Sensor (DEXCOM G7 SENSOR) MISC Inject 1 Application into the skin as directed. Change sensor every 10 days as directed. 03/03/23   Therisa Benton PARAS, NP  Continuous Glucose Transmitter (DEXCOM G6 TRANSMITTER) MISC Change transmitter every 90 days Patient not taking: Reported on 10/06/2023 07/29/22   Therisa Benton PARAS, NP  ezetimibe  (ZETIA ) 10 MG tablet Take 1 tablet (10 mg total) by mouth daily. 06/28/23   Alphonsa Glendia LABOR, MD  fenofibrate  160 MG tablet Take 1 tablet (160 mg total) by mouth daily. 10/03/23   Alphonsa Glendia LABOR, MD  glipiZIDE  (GLUCOTROL  XL) 5 MG 24 hr tablet Take 1 tablet (5 mg total) by mouth daily with breakfast.  03/03/23   Therisa Benton PARAS, NP  glucose blood (CONTOUR NEXT TEST) test strip Use as instructed to monitor glucose 4 times daily 11/04/22   Therisa Benton PARAS, NP  indapamide  (LOZOL ) 2.5 MG tablet TAKE (1) TABLET BY MOUTH ONCE DAILY. 06/28/23   Alphonsa Glendia LABOR, MD  insulin  glargine, 2 Unit Dial, (TOUJEO  MAX SOLOSTAR) 300 UNIT/ML Solostar Pen Inject 130 Units into the skin at bedtime. 10/25/23   Nida, Gebreselassie W, MD  insulin  lispro (HUMALOG ) 100 UNIT/ML injection Inject 0.3-0.36 mLs (30-36 Units total) into the skin 3 (three) times daily with meals. 10/25/23   Nida, Gebreselassie W, MD  Insulin  Pen Needle (PEN NEEDLES) 31G X 6 MM MISC Use to inject insulin  4 times daily 04/29/22   Therisa Benton PARAS, NP  metoprolol  succinate (TOPROL -XL) 100 MG 24 hr tablet TAKE (1) TABLET BY MOUTH ONCE DAILY. 06/28/23   Alphonsa Glendia LABOR, MD  Multiple Vitamin (MULTIVITAMIN) tablet Take 1 tablet by mouth daily.    [provider]  oxyCODONE -acetaminophen  (PERCOCET) 10-325 MG tablet 1 taken 3 to 4 times daily as needed for pain 10/06/23   Alphonsa Glendia LABOR, MD  oxyCODONE -acetaminophen  (PERCOCET) 10-325 MG tablet 1 taken 3 to 4 times daily as needed for pain 10/06/23   Alphonsa Glendia LABOR, MD  oxyCODONE -acetaminophen  (PERCOCET) 10-325 MG tablet 1  taken 4 times daily as needed for pain 10/06/23   Alphonsa Glendia LABOR, MD  rosuvastatin  (CRESTOR ) 40 MG tablet Take 1 tablet (40 mg total) by mouth daily. 03/21/23   Alphonsa Glendia LABOR, MD  Testosterone  20.25 MG/ACT (1.62%) GEL Apply 20.25mg /ACT on each shoulder every morning 10/25/23   Nida, Gebreselassie W, MD  triamcinolone  cream (KENALOG ) 0.1 % APPLY TO AFFECTED AREA SPARINGLY THREE TIMES DAILY. 03/26/22   Alphonsa Glendia LABOR, MD  valsartan  (DIOVAN ) 320 MG tablet Take 1 tablet (320 mg total) by mouth daily. 12/20/23   Sheron Lorette GRADE, PA-C      Allergies    Invokana  [canagliflozin ], Trulicity  [dulaglutide ], Altace [ramipril], and Metformin  and related    Review of Systems   Review of  Systems A 10 point review of systems was performed and is negative unless otherwise reported in HPI.  Physical Exam Updated Vital Signs BP 118/63   Pulse (!) 110   Temp (!) 95 F (35 C) (Rectal)   Resp (!) 28   Ht 5' 11 (1.803 m)   Wt 99.8 kg   SpO2 95%   BMI 30.68 kg/m  Physical Exam General: Acutely ill-appearing obese male, lying in bed.  HEENT: PERRLA, Sclera anicteric, dry mucous membranes, trachea midline.  Cardiology: Regular tachycardic rate, no murmurs/rubs/gallops. Resp: Tachypnea w/ large tidal volume, clear lung sounds. Abd: Soft, non-tender, non-distended. No rebound tenderness or guarding.  GU: Deferred. MSK: No peripheral edema or signs of trauma. Extremities without deformity or TTP. No cyanosis or clubbing. Skin: warm, dry.  Neuro: A&Ox4, CNs II-XII grossly intact. MAEs. Sensation grossly intact.   ED Results / Procedures / Treatments   Labs (all labs ordered are listed, but only abnormal results are displayed) Labs Reviewed  BASIC METABOLIC PANEL WITH GFR - Abnormal; Notable for the following components:      Result Value   Sodium 120 (*)    Chloride 78 (*)    CO2 <7 (*)    Glucose, Bld 1,091 (*)    BUN 43 (*)    Creatinine, Ser 3.21 (*)    GFR, Estimated 21 (*)    All other components within normal limits  BASIC METABOLIC PANEL WITH GFR - Abnormal; Notable for the following components:   Sodium 122 (*)    Chloride 84 (*)    CO2 <7 (*)    Glucose, Bld 1,021 (*)    BUN 43 (*)    Creatinine, Ser 2.98 (*)    Calcium  8.3 (*)    GFR, Estimated 23 (*)    All other components within normal limits  BETA-HYDROXYBUTYRIC ACID - Abnormal; Notable for the following components:   Beta-Hydroxybutyric Acid >8.00 (*)    All other components within normal limits  BETA-HYDROXYBUTYRIC ACID - Abnormal; Notable for the following components:   Beta-Hydroxybutyric Acid >8.00 (*)    All other components within normal limits  CBC WITH DIFFERENTIAL/PLATELET - Abnormal;  Notable for the following components:   WBC 27.8 (*)    Neutro Abs 20.3 (*)    Lymphs Abs 5.3 (*)    Monocytes Absolute 1.7 (*)    Abs Immature Granulocytes 0.60 (*)    All other components within normal limits  BLOOD GAS, VENOUS - Abnormal; Notable for the following components:   pH, Ven 6.98 (*)    pCO2, Ven 18 (*)    pO2, Ven 53 (*)    Bicarbonate 4.5 (*)    Acid-base deficit 26.5 (*)    All other components within normal limits  LACTIC ACID, PLASMA - Abnormal; Notable for the following components:   Lactic  Acid, Venous >9.0 (*)    All other components within normal limits  LACTIC ACID, PLASMA - Abnormal; Notable for the following components:   Lactic Acid, Venous 8.6 (*)    All other components within normal limits  BRAIN NATRIURETIC PEPTIDE - Abnormal; Notable for the following components:   B Natriuretic Peptide 112.0 (*)    All other components within normal limits  BLOOD GAS, VENOUS - Abnormal; Notable for the following components:   pH, Ven 7.08 (*)    pCO2, Ven 22 (*)    pO2, Ven <31 (*)    Bicarbonate 6.8 (*)    Acid-base deficit 22.0 (*)    All other components within normal limits  BASIC METABOLIC PANEL WITH GFR - Abnormal; Notable for the following components:   Sodium 121 (*)    Chloride 83 (*)    CO2 <7 (*)    Glucose, Bld 1,033 (*)    BUN 45 (*)    Creatinine, Ser 3.10 (*)    Calcium  8.2 (*)    GFR, Estimated 22 (*)    All other components within normal limits  CBG MONITORING, ED - Abnormal; Notable for the following components:   Glucose-Capillary >600 (*)    All other components within normal limits  CBG MONITORING, ED - Abnormal; Notable for the following components:   Glucose-Capillary >600 (*)    All other components within normal limits  CBG MONITORING, ED - Abnormal; Notable for the following components:   Glucose-Capillary >600 (*)    All other components within normal limits  CBG MONITORING, ED - Abnormal; Notable for the following  components:   Glucose-Capillary >600 (*)    All other components within normal limits  CBG MONITORING, ED - Abnormal; Notable for the following components:   Glucose-Capillary >600 (*)    All other components within normal limits  CBG MONITORING, ED - Abnormal; Notable for the following components:   Glucose-Capillary >600 (*)    All other components within normal limits  TROPONIN I (HIGH SENSITIVITY) - Abnormal; Notable for the following components:   Troponin I (High Sensitivity) 53 (*)    All other components within normal limits  TROPONIN I (HIGH SENSITIVITY) - Abnormal; Notable for the following components:   Troponin I (High Sensitivity) 295 (*)    All other components within normal limits  RESP PANEL BY RT-PCR (RSV, FLU A&B, COVID)  RVPGX2  CULTURE, BLOOD (ROUTINE X 2)  CULTURE, BLOOD (ROUTINE X 2)  PROTIME-INR  BASIC METABOLIC PANEL WITH GFR  BASIC METABOLIC PANEL WITH GFR  BASIC METABOLIC PANEL WITH GFR  BETA-HYDROXYBUTYRIC ACID  BETA-HYDROXYBUTYRIC ACID  BETA-HYDROXYBUTYRIC ACID  URINALYSIS, ROUTINE W REFLEX MICROSCOPIC    EKG EKG Interpretation Date/Time:  Sunday December 24 2023 06:43:48 EDT Ventricular Rate:  110 PR Interval:  185 QRS Duration:  95 QT Interval:  318 QTC Calculation: 431 R Axis:   32  Text Interpretation: Sinus tachycardia Posterior infarct, old Repol abnrm, severe global ischemia (LM/MVD) Since last tracing Rate faster Confirmed by Roselyn Dunnings [phone]) on 12/24/2023 6:48:35 AM  Radiology DG Chest Portable 1 View Result Date: 12/24/2023 CLINICAL DATA:  63 year old male with abnormal pulmonary auscultation. Shortness of breath. EXAM: PORTABLE CHEST 1 VIEW COMPARISON:  Portable chest 0727 hours today and earlier. FINDINGS: Portable AP view at 0953 hours. Unchanged lung volumes, mediastinal contours which remain within normal limits. Visualized tracheal air column is within normal limits. Chronic symmetric pulmonary interstitial markings appear  stable from 2023. No pneumothorax, pleural effusion  or confluent lung opacity. Paucity of bowel gas. Stable visualized osseous structures. IMPRESSION: Stable, No acute cardiopulmonary abnormality. Electronically Signed   By: VEAR Hurst M.D.   On: 12/24/2023 10:36   DG Chest Portable 1 View Result Date: 12/24/2023 EXAM: 1 VIEW(S) XRAY OF THE CHEST 12/24/2023 07:44:24 AM COMPARISON: 11/23/21 CLINICAL HISTORY: SOB. Per Triage: Pt arrives via POV from home with c/o N/V starting yesterday and SHOB this evening. Hx type 2 diabetic. Pt tachypneic, thirsty. States last insulin  dose was at lunch time. BGL reading high. FINDINGS: LUNGS AND PLEURA: Low lung volumes. No focal pulmonary opacity. No pulmonary edema. No pleural effusion. No pneumothorax. HEART AND MEDIASTINUM: No acute abnormality of the cardiac and mediastinal silhouettes. BONES AND SOFT TISSUES: No acute osseous abnormality. IMPRESSION: 1. No acute cardiopulmonary process. 2. Low lung volumes. Electronically signed by: Waddell Calk MD 12/24/2023 09:09 AM EDT RP Workstation: [id number]    Procedures .Critical Care  Performed by: Franklyn Sid SAILOR, MD Authorized by: Franklyn Sid SAILOR, MD   Critical care provider statement:    Critical care time (minutes):  104   Critical care was necessary to treat or prevent imminent or life-threatening deterioration of the following conditions:  Sepsis, endocrine crisis, respiratory failure, renal failure, dehydration, metabolic crisis and shock   Critical care was time spent personally by me on the following activities:  Development of treatment plan with patient or surrogate, discussions with consultants, evaluation of patient's response to treatment, examination of patient, ordering and review of laboratory studies, ordering and review of radiographic studies, ordering and performing treatments and interventions, pulse oximetry, re-evaluation of patient's condition, review of old charts, obtaining history from patient  or surrogate and ventilator management   Care discussed with: accepting provider at another facility       Medications Ordered in ED Medications  insulin  regular, human (MYXREDLIN ) 100 units/ 100 mL infusion (11.5 Units/hr Intravenous New Bag/Given 12/24/23 0749)  lactated ringers  infusion ( Intravenous New Bag/Given 12/24/23 0750)  dextrose  5 % in lactated ringers  infusion (has no administration in time range)  dextrose  50 % solution 0-50 mL (has no administration in time range)  norepinephrine (LEVOPHED) 4mg  in (0.016 mg/mL) premix infusion ( Intravenous Not Given 12/24/23 1030)  pantoprazole  (PROTONIX ) injection 40 mg (has no administration in time range)  sodium chloride  0.9 % bolus 1,000 mL (0 mLs Intravenous Stopped 12/24/23 0939)  potassium chloride  10 mEq in 100 mL IVPB (10 mEq Intravenous New Bag/Given 12/24/23 0948)  ondansetron  (ZOFRAN ) injection 4 mg (4 mg Intravenous Given 12/24/23 0842)  ceFEPIme  (MAXIPIME ) 2 g in sodium chloride  0.9 % 100 mL IVPB (0 g Intravenous Stopped 12/24/23 0939)  sodium bicarbonate injection 50 mEq (50 mEq Intravenous Given 12/24/23 0936)    ED Course/ Medical Decision Making/ A&P                          Medical Decision Making Amount and/or Complexity of Data Reviewed Labs: ordered. Decision-making details documented in ED Course. Radiology: ordered.  Risk Prescription drug management. Decision regarding hospitalization.    This patient presents to the ED for concern of SOB/N/V, this involves an extensive number of treatment options, and is a complaint that carries with it a high risk of complications and morbidity.  I considered the following differential and admission for this acute, potentially life threatening condition.   MDM:    Patient in DKA with BG 1,091, CO2 <7, AG not calculable. Sodium is 120,  corrects to 136-144. He also has leukocytosis to 27.8 with left shift. Started on fluids immediately. K4.7, started on insulin . He has  AKI also to 3.21. Beta hydroxybutyrate >8, pH <6.98, pCO2 18. He is hypothermic, tachycardia/tachypneic, w/ leukocytosis so code sepsis is called and he is given IV cefepime . CXR w/ no pneumonia.  Patient was given 2.5 L IV fluid and worsens from a respiratory standpoint.  He had been breathing in the 30s breaths per minute with tachypnea and large tidal volumes, but suddenly he increased to the 40s breaths per minute complaining of severe shortness of breath with severely increased work of breathing.  Audible coarse rales throughout all lung fields which were not there before.  Prior echo in August 2025 demonstrated some LVH but no heart failure.  His EKG here today shows diffuse ST depressions with an elevation in aVR indicating possible global ischemia.  Initial troponin is 53 but I do have some concern for acute heart failure or volume overload/pulmonary edema.  Patient is crying stating that he cannot breathe. At this time he also becomes hypotensive, in 80s systolic with MAP ~60 mmHg. Added norepinephrine gtt. He is reassuringly not hypoxic but I do feel at this time that BiPAP is necessary. He hasn't had any nausea/vomiting here in the ED. Respiratory place BiPAP with rate of 25, patient breathing over at the rate of ~35 breaths/min, PEEP set at 5.  Patient pulling tidal volumes greater than 1300 cc and reports that he feels better this way. He is still tachypneic but his work of breathing is less. Also administered one amp of bicarb  Discussed with Dr. Rosiland with ICU at [facility]. Will obtain BNP and repeat CXR and VBG now as well as another BMP to help guide care. His rales improved quickly, so restarted the fluid resuscitation with the remainder of the 3rd L LR bolus. Patient already on BiPAP for ~20 minutes and rales much improved before 2nd chest xray was obtained.   Clinical Course as of 12/24/23 1101  Sun Dec 24, 2023  9043 Accepted for admission to ICU by Dr. Rosiland [HN]  1004 Lactic Acid,  Venous(!!): 8.6 Improving [HN]  1016 Temp(!): 95 F (35 C) Improving temperature [HN]  1016 Glucose(!!): 1,021 Glucose essentially unchanged though he has had IV insulin  running at 11.5 U/hr, will CTM. [HN]  1017 Basic metabolic panel(!!) Improved Cl and Cr; CO2 still <7 [HN]  1101 Blood gas, venous (at [facility] and [facility])(!!) VBG improved [HN]  1101 [facility] picking patient up now. Patient complained of some heartburn, giving protonix  IV. Asked about nausea again and patient stated no nausea. [HN]    Clinical Course User Index [HN] Franklyn Sid SAILOR, MD    Labs: I Ordered, and personally interpreted labs.  The pertinent results include:  those listed above  Imaging Studies ordered: I ordered imaging studies including CXR I independently visualized and interpreted imaging. I agree with the radiologist interpretation  Additional history obtained from chart reivew, wife at bedside.    Cardiac Monitoring: The patient was maintained on a cardiac monitor.  I personally viewed and interpreted the cardiac monitored which showed an underlying rhythm of: sinus tachycardia  Reevaluation: After the interventions noted above, I reevaluated the patient and found that they have :improved  Social Determinants of Health: Lives independently  Disposition:  Admit to ICU  Co morbidities that complicate the patient evaluation  Past Medical History:  Diagnosis Date   Diabetes mellitus without complication (HCC) 2004   GERD (  gastroesophageal reflux disease)    Hyperlipidemia    Hypertension    Osteoarthritis      Medicines Meds ordered this encounter  Medications   sodium chloride  0.9 % bolus 1,000 mL   insulin  regular, human (MYXREDLIN ) 100 units/ 100 mL infusion    EndoTool Goal Range::   140-180    Type of Diabetes:   Type 2    Mode of Therapy:   ENDOX1 for DKA    Start Method:   EndoTool to calculate   lactated ringers  infusion   dextrose  5 % in lactated ringers  infusion   dextrose  50  % solution 0-50 mL   potassium chloride  10 mEq in 100 mL IVPB   ondansetron  (ZOFRAN ) injection 4 mg   ceFEPIme  (MAXIPIME ) 2 g in sodium chloride  0.9 % 100 mL IVPB   norepinephrine (LEVOPHED) 4mg  in (0.016 mg/mL) premix infusion   sodium bicarbonate injection 50 mEq   pantoprazole  (PROTONIX ) injection 40 mg    I have reviewed the patients home medicines and have made adjustments as needed  Problem List / ED Course: Problem List Items Addressed This Visit       Endocrine   * (Principal) DKA (diabetic ketoacidosis) (HCC) - Primary   Relevant Medications   insulin  regular, human (MYXREDLIN ) 100 units/ 100 mL infusion   Other Visit Diagnoses       Hypothermia, initial encounter         AKI (acute kidney injury)         Bilateral rales                       This note was created using dictation software, which may contain spelling or grammatical errors.    Franklyn Sid SAILOR, MD 12/24/23 [phone]

## 2023-12-25 ENCOUNTER — Inpatient Hospital Stay (HOSPITAL_COMMUNITY)

## 2023-12-25 DIAGNOSIS — K859 Acute pancreatitis without necrosis or infection, unspecified: Secondary | ICD-10-CM

## 2023-12-25 LAB — BASIC METABOLIC PANEL WITH GFR
Anion gap: 17 — ABNORMAL HIGH (ref 5–15)
Anion gap: 11 (ref 5–15)
Anion gap: 11 (ref 5–15)
Anion gap: 12 (ref 5–15)
BUN: 29 mg/dL — ABNORMAL HIGH (ref 8–23)
BUN: 25 mg/dL — ABNORMAL HIGH (ref 8–23)
BUN: 22 mg/dL (ref 8–23)
BUN: 17 mg/dL (ref 8–23)
CO2: 20 mmol/L — ABNORMAL LOW (ref 22–32)
CO2: 19 mmol/L — ABNORMAL LOW (ref 22–32)
CO2: 20 mmol/L — ABNORMAL LOW (ref 22–32)
CO2: 22 mmol/L (ref 22–32)
Calcium: 8.1 mg/dL — ABNORMAL LOW (ref 8.9–10.3)
Calcium: 7.2 mg/dL — ABNORMAL LOW (ref 8.9–10.3)
Calcium: 7.1 mg/dL — ABNORMAL LOW (ref 8.9–10.3)
Calcium: 7.2 mg/dL — ABNORMAL LOW (ref 8.9–10.3)
Chloride: 96 mmol/L — ABNORMAL LOW (ref 98–111)
Chloride: 100 mmol/L (ref 98–111)
Chloride: 99 mmol/L (ref 98–111)
Chloride: 100 mmol/L (ref 98–111)
Creatinine, Ser: 1.84 mg/dL — ABNORMAL HIGH (ref 0.61–1.24)
Creatinine, Ser: 1.67 mg/dL — ABNORMAL HIGH (ref 0.61–1.24)
Creatinine, Ser: 1.59 mg/dL — ABNORMAL HIGH (ref 0.61–1.24)
Creatinine, Ser: 1.66 mg/dL — ABNORMAL HIGH (ref 0.61–1.24)
GFR, Estimated: 41 mL/min — ABNORMAL LOW (ref >60)
GFR, Estimated: 46 mL/min — ABNORMAL LOW (ref >60)
GFR, Estimated: 49 mL/min — ABNORMAL LOW (ref >60)
GFR, Estimated: 46 mL/min — ABNORMAL LOW (ref >60)
Glucose, Bld: 249 mg/dL — ABNORMAL HIGH (ref 70–99)
Glucose, Bld: 243 mg/dL — ABNORMAL HIGH (ref 70–99)
Glucose, Bld: 196 mg/dL — ABNORMAL HIGH (ref 70–99)
Glucose, Bld: 178 mg/dL — ABNORMAL HIGH (ref 70–99)
Potassium: 3.7 mmol/L (ref 3.5–5.1)
Potassium: 3.7 mmol/L (ref 3.5–5.1)
Potassium: 3.4 mmol/L — ABNORMAL LOW (ref 3.5–5.1)
Potassium: 3.5 mmol/L (ref 3.5–5.1)
Sodium: 133 mmol/L — ABNORMAL LOW (ref 135–145)
Sodium: 130 mmol/L — ABNORMAL LOW (ref 135–145)
Sodium: 130 mmol/L — ABNORMAL LOW (ref 135–145)
Sodium: 134 mmol/L — ABNORMAL LOW (ref 135–145)

## 2023-12-25 LAB — RESPIRATORY PANEL BY PCR

## 2023-12-25 LAB — GLUCOSE, CAPILLARY
Glucose-Capillary: 165 mg/dL — ABNORMAL HIGH (ref 70–99)
Glucose-Capillary: 170 mg/dL — ABNORMAL HIGH (ref 70–99)
Glucose-Capillary: 174 mg/dL — ABNORMAL HIGH (ref 70–99)
Glucose-Capillary: 178 mg/dL — ABNORMAL HIGH (ref 70–99)
Glucose-Capillary: 188 mg/dL — ABNORMAL HIGH (ref 70–99)
Glucose-Capillary: 201 mg/dL — ABNORMAL HIGH (ref 70–99)
Glucose-Capillary: 203 mg/dL — ABNORMAL HIGH (ref 70–99)
Glucose-Capillary: 203 mg/dL — ABNORMAL HIGH (ref 70–99)
Glucose-Capillary: 218 mg/dL — ABNORMAL HIGH (ref 70–99)
Glucose-Capillary: 220 mg/dL — ABNORMAL HIGH (ref 70–99)
Glucose-Capillary: 222 mg/dL — ABNORMAL HIGH (ref 70–99)
Glucose-Capillary: 226 mg/dL — ABNORMAL HIGH (ref 70–99)
Glucose-Capillary: 236 mg/dL — ABNORMAL HIGH (ref 70–99)
Glucose-Capillary: 239 mg/dL — ABNORMAL HIGH (ref 70–99)
Glucose-Capillary: 248 mg/dL — ABNORMAL HIGH (ref 70–99)
Glucose-Capillary: 249 mg/dL — ABNORMAL HIGH (ref 70–99)
Glucose-Capillary: 252 mg/dL — ABNORMAL HIGH (ref 70–99)
Glucose-Capillary: 253 mg/dL — ABNORMAL HIGH (ref 70–99)
Glucose-Capillary: 257 mg/dL — ABNORMAL HIGH (ref 70–99)
Glucose-Capillary: 274 mg/dL — ABNORMAL HIGH (ref 70–99)
Glucose-Capillary: 279 mg/dL — ABNORMAL HIGH (ref 70–99)

## 2023-12-25 LAB — COMPREHENSIVE METABOLIC PANEL WITH GFR
ALT: 34 U/L (ref 0–44)
AST: 135 U/L — ABNORMAL HIGH (ref 15–41)
Albumin: 3.1 g/dL — ABNORMAL LOW (ref 3.5–5.0)
Alkaline Phosphatase: 32 U/L — ABNORMAL LOW (ref 38–126)
Anion gap: 13 (ref 5–15)
BUN: 28 mg/dL — ABNORMAL HIGH (ref 8–23)
CO2: 20 mmol/L — ABNORMAL LOW (ref 22–32)
Calcium: 7.8 mg/dL — ABNORMAL LOW (ref 8.9–10.3)
Chloride: 98 mmol/L (ref 98–111)
Creatinine, Ser: 1.97 mg/dL — ABNORMAL HIGH (ref 0.61–1.24)
GFR, Estimated: 38 mL/min — ABNORMAL LOW (ref >60)
Glucose, Bld: 269 mg/dL — ABNORMAL HIGH (ref 70–99)
Potassium: 3.7 mmol/L (ref 3.5–5.1)
Sodium: 131 mmol/L — ABNORMAL LOW (ref 135–145)
Total Bilirubin: 1.1 mg/dL (ref 0.0–1.2)
Total Protein: 6.5 g/dL (ref 6.5–8.1)

## 2023-12-25 LAB — TRIGLYCERIDES
Triglycerides: 1716 mg/dL — ABNORMAL HIGH (ref <150)
Triglycerides: 1016 mg/dL — ABNORMAL HIGH (ref <150)

## 2023-12-25 LAB — CBC
HCT: 40.3 % (ref 39.0–52.0)
Hemoglobin: 14.9 g/dL (ref 13.0–17.0)
MCH: 30.5 pg (ref 26.0–34.0)
MCHC: 37 g/dL — ABNORMAL HIGH (ref 30.0–36.0)
MCV: 82.4 fL (ref 80.0–100.0)
Platelets: 187 K/uL (ref 150–400)
RBC: 4.89 MIL/uL (ref 4.22–5.81)
RDW: 12.4 % (ref 11.5–15.5)
WBC: 14.5 K/uL — ABNORMAL HIGH (ref 4.0–10.5)
nRBC: 0 % (ref 0.0–0.2)

## 2023-12-25 LAB — POCT I-STAT 7, (LYTES, BLD GAS, ICA,H+H)
Acid-base deficit: 2 mmol/L (ref 0.0–2.0)
Bicarbonate: 21.9 mmol/L (ref 20.0–28.0)
Calcium, Ion: 1.03 mmol/L — ABNORMAL LOW (ref 1.15–1.40)
HCT: 35 % — ABNORMAL LOW (ref 39.0–52.0)
Hemoglobin: 11.9 g/dL — ABNORMAL LOW (ref 13.0–17.0)
O2 Saturation: 96 %
Patient temperature: 98.5
Potassium: 3.8 mmol/L (ref 3.5–5.1)
Sodium: 132 mmol/L — ABNORMAL LOW (ref 135–145)
TCO2: 23 mmol/L (ref 22–32)
pCO2 arterial: 33.5 mmHg (ref 32–48)
pH, Arterial: 7.424 (ref 7.35–7.45)
pO2, Arterial: 81 mmHg — ABNORMAL LOW (ref 83–108)

## 2023-12-25 LAB — LACTIC ACID, PLASMA
Lactic Acid, Venous: 2.9 mmol/L (ref 0.5–1.9)
Lactic Acid, Venous: 2.6 mmol/L (ref 0.5–1.9)

## 2023-12-25 LAB — CG4 I-STAT (LACTIC ACID): Lactic Acid, Venous: 2.1 mmol/L (ref 0.5–1.9)

## 2023-12-25 LAB — MAGNESIUM: Magnesium: 1.6 mg/dL — ABNORMAL LOW (ref 1.7–2.4)

## 2023-12-25 LAB — BETA-HYDROXYBUTYRIC ACID
Beta-Hydroxybutyric Acid: 2.86 mmol/L — ABNORMAL HIGH (ref 0.05–0.27)
Beta-Hydroxybutyric Acid: 1.8 mmol/L — ABNORMAL HIGH (ref 0.05–0.27)

## 2023-12-25 LAB — PROCALCITONIN: Procalcitonin: 1.54 ng/mL

## 2023-12-25 LAB — PHOSPHORUS: Phosphorus: 1.8 mg/dL — ABNORMAL LOW (ref 2.5–4.6)

## 2023-12-25 MED ORDER — POTASSIUM CHLORIDE 2 MEQ/ML IV SOLN
INTRAVENOUS | Status: DC
  Filled 2023-12-25: qty 1000

## 2023-12-25 MED ORDER — PIPERACILLIN-TAZOBACTAM 3.375 G IVPB: 3.3750 g | Freq: Three times a day (TID) | INTRAVENOUS | Status: DC

## 2023-12-25 MED ORDER — POTASSIUM CHLORIDE 2 MEQ/ML IV SOLN: INTRAVENOUS | Status: DC

## 2023-12-25 MED ORDER — HEPARIN SODIUM (PORCINE) 1000 UNIT/ML IJ SOLN
INTRAMUSCULAR | Status: AC
  Administered 2023-12-25: 1000 [IU] via ARTERIOVENOUS_FISTULA
  Filled 2023-12-25: qty 4

## 2023-12-25 MED ORDER — ALBUMIN HUMAN 5 % IV SOLN
INTRAVENOUS | Status: AC
  Administered 2023-12-25: 25 g via INTRAVENOUS
  Filled 2023-12-25: qty 250

## 2023-12-25 MED ORDER — MAGNESIUM SULFATE 4 GM/100ML IV SOLN
4.0000 g | Freq: Once | INTRAVENOUS | Status: AC
  Administered 2023-12-25: 4 g via INTRAVENOUS
  Filled 2023-12-25: qty 100

## 2023-12-25 MED ORDER — HYDROMORPHONE HCL 1 MG/ML IJ SOLN
1.0000 mg | INTRAMUSCULAR | Status: DC | PRN
  Administered 2023-12-25 – 2023-12-26 (×3): 1 mg via INTRAVENOUS
  Filled 2023-12-25 (×3): qty 1

## 2023-12-25 MED ORDER — LACTATED RINGERS IV BOLUS
1000.0000 mL | Freq: Once | INTRAVENOUS | Status: AC
  Administered 2023-12-25: 1000 mL via INTRAVENOUS

## 2023-12-25 MED ORDER — ALBUMIN HUMAN 5 % IV SOLN
25.0000 g | Freq: Once | INTRAVENOUS | Status: AC
Start: 2023-12-25 — End: 2023-12-25

## 2023-12-25 MED ORDER — SODIUM CHLORIDE 0.9 % IV SOLN
12.5000 mg | Freq: Once | INTRAVENOUS | Status: AC
  Administered 2023-12-25: 12.5 mg via INTRAVENOUS
  Filled 2023-12-25: qty 0.5

## 2023-12-25 MED ORDER — NOREPINEPHRINE 4 MG/250ML-% IV SOLN
INTRAVENOUS | Status: AC
  Filled 2023-12-25: qty 250

## 2023-12-25 MED ORDER — HYDROMORPHONE HCL 1 MG/ML IJ SOLN
1.0000 mg | INTRAMUSCULAR | Status: DC | PRN
  Administered 2023-12-25 (×2): 1 mg via INTRAVENOUS
  Filled 2023-12-25 (×2): qty 1

## 2023-12-25 MED ORDER — METOCLOPRAMIDE HCL 5 MG/ML IJ SOLN
10.0000 mg | Freq: Four times a day (QID) | INTRAMUSCULAR | Status: AC | PRN
  Administered 2023-12-25 – 2023-12-26 (×2): 10 mg via INTRAVENOUS
  Filled 2023-12-25 (×2): qty 2

## 2023-12-25 MED ORDER — DEXTROSE IN LACTATED RINGERS 5 % IV SOLN
INTRAVENOUS | Status: DC
Start: 2023-12-25 — End: 2023-12-26

## 2023-12-25 MED ORDER — LACTATED RINGERS IV BOLUS
500.0000 mL | Freq: Once | INTRAVENOUS | Status: AC
  Administered 2023-12-25: 500 mL via INTRAVENOUS

## 2023-12-25 MED ORDER — SODIUM CHLORIDE 0.9 % IV SOLN
12.5000 mg | Freq: Once | INTRAVENOUS | Status: AC | PRN
  Administered 2023-12-25: 12.5 mg via INTRAVENOUS
  Filled 2023-12-25: qty 0.5

## 2023-12-25 MED ORDER — POTASSIUM PHOSPHATES 15 MMOLE/5ML IV SOLN
30.0000 mmol | Freq: Once | INTRAVENOUS | Status: AC
  Administered 2023-12-25: 30 mmol via INTRAVENOUS
  Filled 2023-12-25: qty 10

## 2023-12-25 MED ORDER — POTASSIUM CHLORIDE CRYS ER 20 MEQ PO TBCR
40.0000 meq | EXTENDED_RELEASE_TABLET | Freq: Once | ORAL | Status: AC
  Administered 2023-12-25: 40 meq via ORAL
  Filled 2023-12-25: qty 2

## 2023-12-25 MED ORDER — MORPHINE SULFATE (PF) 2 MG/ML IV SOLN
2.0000 mg | INTRAVENOUS | Status: DC | PRN
  Administered 2023-12-25: 2 mg via INTRAVENOUS
  Filled 2023-12-25: qty 1

## 2023-12-25 MED ORDER — PIPERACILLIN-TAZOBACTAM 3.375 G IVPB
3.3750 g | Freq: Three times a day (TID) | INTRAVENOUS | Status: DC
  Administered 2023-12-25 – 2023-12-27 (×6): 3.375 g via INTRAVENOUS
  Filled 2023-12-25 (×6): qty 50

## 2023-12-25 NOTE — Progress Notes (Addendum)
 eLink Physician-Brief Progress Note Patient Name: Terry [name] DOB: 1960-10-25 MRN: [id number]   Date of Service  12/25/2023  HPI/Events of Note  Pain not relieved with morphine  Seen grimacing and writhing in pain  eICU Interventions  Switch to Dilaudid 1 mg q 2 x 3 doses     Intervention Category Intermediate Interventions: Pain - evaluation and management  [name] 12/25/2023, 2:16 AM

## 2023-12-25 NOTE — Progress Notes (Signed)
 [facility] ADULT ICU REPLACEMENT PROTOCOL   The patient does apply for the [facility] Adult ICU Electrolyte Replacment Protocol based on the criteria listed below:   1.Exclusion criteria: TCTS, ECMO, Dialysis, and Myasthenia Gravis patients 2. Is GFR >/= 30 ml/min? Yes.    Patient's GFR today is 38 3. Is SCr </= 2? Yes.   Patient's SCr is 1.97 mg/dL 4. Did SCr increase >/= 0.5 in 24 hours? No. 5.Pt's weight >40kg  Yes.   6. Abnormal electrolyte(s): Phos, K, Mag  7. Electrolytes replaced per protocol 8.  Call MD STAT for K+ </= 2.5, Phos </= 1, or Mag </= 1 Physician:  Marion Hunter BRAVO Kytzia Gienger 12/25/2023 3:35 AM

## 2023-12-25 NOTE — Consult Note (Signed)
 [facility] Renal Consultation Note  Requesting MD: Sammi Fredericks, MD Indication for Consultation:  acute pancreatitis   Chief complaint: nausea and vomiting  HPI:  Terry [name] is a 63 y.o. male with a history of type 2 diabetes, hypertension, GERD, and osteoarthritis who presented to the hospital with nausea and vomiting and abdominal pain; this has now been going on about 4 days.  When he presented, he was found to have glucose of 1091 creatinine 3.21, bicarb less than 7.  He had concerns for septic shock and DKA.  Triglycerides were elevated at 1,716 and on repeat today were 1016.  He was initiated on zosyn and insulin  gtt and fluids.  Imaging was notable for CT a/p today with extensive airspace disease in lower lobes (cannot exclude PNA) and CXR this afternoon with patchy bilateral airspace opacities which may be due to edema or PNA.  Team has asked for consideration of plasmapheresis.  Patient has had DM for 15 years and HTN for about that long as well.  No NSAID use.  No family hx of CKD.  He doesn't have a nephrologist.  He denies any difficulty urinating.  He's ok with seeing us  in clinic to establish care there.    Creat  Date/Time Value Ref Range Status  02/25/2014 07:27 AM 0.91 0.50 - 1.35 mg/dL Final  97/85/7984 91:96 AM 0.98 0.50 - 1.35 mg/dL Final  94/76/7985 95:50 PM 0.99 0.50 - 1.35 mg/dL Final   Creatinine, Ser  Date/Time Value Ref Range Status  12/25/2023 02:26 PM 1.59 (H) 0.61 - 1.24 mg/dL Final  90/70/7974 88:68 AM 1.67 (H) 0.61 - 1.24 mg/dL Final  90/70/7974 97:72 AM 1.97 (H) 0.61 - 1.24 mg/dL Final  90/71/7974 88:43 PM 1.84 (H) 0.61 - 1.24 mg/dL Final  90/71/7974 91:52 PM 1.90 (H) 0.61 - 1.24 mg/dL Final  90/71/7974 93:53 PM 2.13 (H) 0.61 - 1.24 mg/dL Final  90/71/7974 97:64 PM 2.70 (H) 0.61 - 1.24 mg/dL Final  90/71/7974 90:48 AM 3.10 (H) 0.61 - 1.24 mg/dL Final  90/71/7974 90:66 AM 2.98 (H) 0.61 - 1.24 mg/dL Final  90/71/7974 93:74 AM 3.21  (H) 0.61 - 1.24 mg/dL Final  93/86/7974 91:89 AM 1.57 (H) 0.76 - 1.27 mg/dL Final  96/85/7974 91:97 AM 1.68 (H) 0.76 - 1.27 mg/dL Final  88/98/7975 91:48 AM 1.34 (H) 0.76 - 1.27 mg/dL Final  91/73/7975 91:94 AM 1.79 (H) 0.76 - 1.27 mg/dL Final  87/78/7976 90:82 AM 1.43 (H) 0.76 - 1.27 mg/dL Final  91/68/7976 95:77 AM 1.52 (H) 0.61 - 1.24 mg/dL Final  91/69/7976 91:85 AM 1.62 (H) 0.61 - 1.24 mg/dL Final  91/69/7976 97:71 AM 1.75 (H) 0.61 - 1.24 mg/dL Final  91/70/7976 90:53 PM 1.59 (H) 0.61 - 1.24 mg/dL Final    Comment:    DELTA CHECK NOTED  11/23/2021 02:40 PM 2.80 (H) 0.61 - 1.24 mg/dL Final  91/70/7976 88:82 AM 3.53 (H) 0.61 - 1.24 mg/dL Final  94/87/7976 91:94 AM 1.65 (H) 0.61 - 1.24 mg/dL Final  88/78/7977 91:96 AM 1.46 (H) 0.76 - 1.27 mg/dL Final  90/76/7977 91:95 AM 1.54 (H) 0.76 - 1.27 mg/dL Final  95/95/7977 90:98 AM 1.15 0.76 - 1.27 mg/dL Final  89/77/7978 90:71 AM 1.36 (H) 0.76 - 1.27 mg/dL Final  91/72/7978 96:92 PM 1.66 (H) 0.76 - 1.27 mg/dL Final  91/83/7978 90:68 AM 1.34 (H) 0.76 - 1.27 mg/dL Final  92/86/7978 91:55 AM 1.32 (H) 0.76 - 1.27 mg/dL Final  98/71/7978 88:84 AM 1.22 0.76 - 1.27 mg/dL Final  04/10/2019 04:30 AM 0.90 0.61 - 1.24 mg/dL Final  98/87/7978 91:56 AM 0.98 0.61 - 1.24 mg/dL Final  98/88/7978 95:70 AM 1.00 0.61 - 1.24 mg/dL Final  98/88/7978 87:93 AM 1.07 0.61 - 1.24 mg/dL Final  98/89/7978 92:54 PM 1.08 0.61 - 1.24 mg/dL Final  98/89/7978 95:83 PM 1.14 0.61 - 1.24 mg/dL Final  98/89/7978 87:85 PM 1.14 0.61 - 1.24 mg/dL Final  98/89/7978 95:54 AM 1.24 0.61 - 1.24 mg/dL Final  98/89/7978 87:48 AM 1.51 (H) 0.61 - 1.24 mg/dL Final  98/90/7978 91:73 PM 1.46 (H) 0.61 - 1.24 mg/dL Final  98/90/7978 94:87 PM 1.45 (H) 0.61 - 1.24 mg/dL Final  98/90/7978 98:40 PM 1.44 (H) 0.61 - 1.24 mg/dL Final  98/91/7978 89:83 PM 1.60 (H) 0.61 - 1.24 mg/dL Final  98/91/7978 93:81 PM 1.45 (H) 0.61 - 1.24 mg/dL Final  98/91/7978 95:65 PM 1.45 (H) 0.61 - 1.24 mg/dL Final   98/91/7978 89:85 AM 1.42 (H) 0.61 - 1.24 mg/dL Final  98/91/7978 97:82 AM 1.57 (H) 0.61 - 1.24 mg/dL Final  98/92/7978 91:73 PM 1.88 (H) 0.61 - 1.24 mg/dL Final  98/92/7978 94:71 PM 1.82 (H) 0.61 - 1.24 mg/dL Final  91/96/7979 91:95 AM 1.21 0.76 - 1.27 mg/dL Final  89/73/7980 91:95 AM 1.26 0.76 - 1.27 mg/dL Final  94/96/7980 91:86 AM 1.14 0.76 - 1.27 mg/dL Final     PMHx:   Past Medical History:  Diagnosis Date   Diabetes mellitus without complication (HCC) 2004   GERD (gastroesophageal reflux disease)    Hyperlipidemia    Hypertension    Osteoarthritis     Past Surgical History:  Procedure Laterality Date   ANKLE SURGERY Left 03/28/2005   2 plates/screws   COLONOSCOPY N/A 08/04/2017   Procedure: COLONOSCOPY;  Surgeon: Golda Claudis PENNER, MD;  Location: [facility];  Service: Endoscopy;  Laterality: N/A;   ESOPHAGEAL DILATION N/A 08/04/2017   Procedure: ESOPHAGEAL DILATION;  Surgeon: Golda Claudis PENNER, MD;  Location: [facility];  Service: Endoscopy;  Laterality: N/A;   ESOPHAGOGASTRODUODENOSCOPY N/A 08/04/2017   Procedure: ESOPHAGOGASTRODUODENOSCOPY (EGD);  Surgeon: Golda Claudis PENNER, MD;  Location: [facility];  Service: Endoscopy;  Laterality: N/A;   POLYPECTOMY  08/04/2017   Procedure: POLYPECTOMY;  Surgeon: Golda Claudis PENNER, MD;  Location: [facility];  Service: Endoscopy;;   ROOT CANAL      Family Hx:  Family History  Problem Relation Age of Onset   Hypertension Mother    Diabetes Mother    Heart disease Mother    Hyperlipidemia Mother    Cancer Father        lung    Social History:  reports that he quit smoking about 31 years ago. His smoking use included cigarettes. He has never used smokeless tobacco. He reports that he does not currently use drugs after having used the following drugs: Marijuana. He reports that he does not drink alcohol.  Allergies:  Allergies  Allergen Reactions   Invokana  [Canagliflozin ] Other (See Comments)    laryngitis    Trulicity  [Dulaglutide ] Swelling    Patient also did not tolerate because of blistering in the mouth would avoid all GLP-1's patient was hospitalized with diabetic ketoacidosis August 2023   Altace [Ramipril] Cough   Metformin  And Related Diarrhea    Medications: Prior to Admission medications   Medication Sig Start Date End Date Taking? Authorizing Provider  amLODipine  (NORVASC ) 10 MG tablet Take 1 tablet (10 mg total) by mouth daily. 12/20/23   Sheron Lorette GRADE, PA-C  aspirin  EC 81 MG tablet Take 1 tablet (81 mg total) by mouth daily with breakfast. 04/10/19   Emokpae, Courage, MD  Continuous Glucose Sensor (DEXCOM G7 SENSOR) MISC Inject 1 Application into the skin as directed. Change sensor every 10 days as directed. 03/03/23   Therisa Benton PARAS, NP  ezetimibe  (ZETIA ) 10 MG tablet Take 1 tablet (10 mg total) by mouth daily. 06/28/23   Alphonsa Glendia LABOR, MD  fenofibrate  160 MG tablet Take 1 tablet (160 mg total) by mouth daily. 10/03/23   Alphonsa Glendia LABOR, MD  glipiZIDE  (GLUCOTROL  XL) 5 MG 24 hr tablet Take 1 tablet (5 mg total) by mouth daily with breakfast. 03/03/23   Therisa Benton PARAS, NP  HUMALOG  KWIKPEN 100 UNIT/ML KwikPen Inject 0-41 Units into the skin 3 (three) times daily. 09/09/23   [provider]  indapamide  (LOZOL ) 2.5 MG tablet TAKE (1) TABLET BY MOUTH ONCE DAILY. 06/28/23   Alphonsa Glendia LABOR, MD  insulin  glargine, 2 Unit Dial, (TOUJEO  MAX SOLOSTAR) 300 UNIT/ML Solostar Pen Inject 130 Units into the skin at bedtime. 10/25/23   Nida, Gebreselassie W, MD  metoprolol  succinate (TOPROL -XL) 100 MG 24 hr tablet TAKE (1) TABLET BY MOUTH ONCE DAILY. 06/28/23   Alphonsa Glendia LABOR, MD  Multiple Vitamin (MULTIVITAMIN) tablet Take 1 tablet by mouth daily.    [provider]  oxyCODONE -acetaminophen  (PERCOCET) 10-325 MG tablet 1  taken 4 times daily as needed for pain 10/06/23   Alphonsa Glendia LABOR, MD  rosuvastatin  (CRESTOR ) 40 MG tablet Take 1 tablet (40 mg total) by mouth daily. 03/21/23    Alphonsa Glendia LABOR, MD  Testosterone  20.25 MG/ACT (1.62%) GEL Apply 20.25mg /ACT on each shoulder every morning 10/25/23   Nida, Gebreselassie W, MD  triamcinolone  cream (KENALOG ) 0.1 % APPLY TO AFFECTED AREA SPARINGLY THREE TIMES DAILY. 03/26/22   Alphonsa Glendia LABOR, MD  valsartan  (DIOVAN ) 320 MG tablet Take 1 tablet (320 mg total) by mouth daily. 12/20/23   Sheron Lorette GRADE, PA-C   I have reviewed the patient's current and reported prior to admission medications.   Labs:     Latest Ref Rng & Units 12/25/2023    2:26 PM 12/25/2023   11:31 AM 12/25/2023    2:27 AM  BMP  Glucose 70 - 99 mg/dL 803  756  730   BUN 8 - 23 mg/dL 22  25  28    Creatinine 0.61 - 1.24 mg/dL 8.40  8.32  8.02   Sodium 135 - 145 mmol/L 130  130    132  131   Potassium 3.5 - 5.1 mmol/L 3.4  3.7    3.8  3.7   Chloride 98 - 111 mmol/L 99  100  98   CO2 22 - 32 mmol/L 20  19  20    Calcium  8.9 - 10.3 mg/dL 7.1  7.2  7.8     Urinalysis    Component Value Date/Time   COLORURINE YELLOW 12/24/2023 1148   APPEARANCEUR HAZY (A) 12/24/2023 1148   LABSPEC 1.020 12/24/2023 1148   PHURINE 5.0 12/24/2023 1148   GLUCOSEU >=500 (A) 12/24/2023 1148   HGBUR MODERATE (A) 12/24/2023 1148   BILIRUBINUR NEGATIVE 12/24/2023 1148   KETONESUR 20 (A) 12/24/2023 1148   PROTEINUR 30 (A) 12/24/2023 1148   NITRITE NEGATIVE 12/24/2023 1148   LEUKOCYTESUR NEGATIVE 12/24/2023 1148     ROS:  Pertinent items noted in HPI and remainder of comprehensive ROS otherwise negative.  Physical Exam: Vitals:   12/25/23 1640 12/25/23 1645  BP: 98/68 (!) 89/62  Pulse: (!) 126 (!) 125  Resp: (!) 23 (!) 23  Temp:    SpO2: 95% 95%     General:  adult male in bed, uncomfortable. Hiccups HEENT: NCAT Eyes: EOMI sclera anicteric Neck: supple trachea midline  Heart: S1S2 tachycardic; no rub Lungs: clear to auscultation; normal work of breathing at rest on 4 liters  Abdomen: tight, tender, and distended; no guarding and no rebound Extremities: no  edema appreciated; no cyanosis or clubbing Skin: no rash on extremities exposed Neuro: alert and oriented x 3 provides hx and follows commands   Assessment/Plan:  # Acute Pancreatitis    - Felt secondary to hypertriglyceridemia - Given previous concerns for septic shock as well as improving triglyceride levels, would recommend against plasmapheresis.  WBC may be reactive but was 27.8 on presentation.  Discussed with the primary team - Continue supportive measures   # AKI  - Secondary to pre-renal insults - Improving with hydration  - holding home ARB and thiazide   # DKA - Continue insulin  per primary team  - Fluids also per primary team in the setting of DKA   # CKD stage 3a - Baseline Cr 1.4 - 1.7 - I will request that our office reach out to him to set up an appointment to establish care in clinic   # HTN - hx of such - holding home anti-hypertensives for now  - Would remain off of ARB and hydrochlorothiazide until seen by renal in follow-up please  Thank you for the consult.  Nephrology will sign off.  Please do not hesitate to contact us  with any questions regarding this patient or if his clinical status changes   Katheryn JAYSON Saba 12/25/2023, 6:33 PM

## 2023-12-25 NOTE — Progress Notes (Signed)
 eLink Physician-Brief Progress Note Patient Name: Terry [name] DOB: 01/02/1961 MRN: [id number]   Date of Service  12/25/2023  HPI/Events of Note  Notified of BP 77/55 (62)  Urine output only 400. Patient seems to be a bit more comfortable  eICU Interventions  Ordered a 500 cc LR bolus     Intervention Category Intermediate Interventions: Hypotension - evaluation and management  [name] 12/25/2023, 5:37 AM

## 2023-12-25 NOTE — Progress Notes (Signed)
 eLink Physician-Brief Progress Note Patient Name: Terry [name] DOB: 1961-03-20 MRN: [id number]   Date of Service  12/25/2023  HPI/Events of Note  On insuline drip for pancreatitis.  BMP from  18:53 K 3.5   eICU Interventions  Already has 40 meq oral , getting it now.      Intervention Category Intermediate Interventions: Electrolyte abnormality - evaluation and management  [name] 12/25/2023, 9:21 PM

## 2023-12-25 NOTE — Progress Notes (Addendum)
 eLink Physician-Brief Progress Note Patient Name: Terry [name] DOB: 1960-09-26 MRN: [id number]   Date of Service  12/25/2023  HPI/Events of Note  BP 61/36 HR 111 ongoing fluid bolus Responsive but somewhat lethargic Last Dilaudid dose 3 hours ago  eICU Interventions  Ordered albumin 5% 25 g Likely 3rd spacing from the pancreatitis Triglycerides 1,716. Will need to continue insulin  drip until triglycerides < 500     Intervention Category Intermediate Interventions: Hypotension - evaluation and management  [name] 12/25/2023, 6:04 AM

## 2023-12-25 NOTE — Progress Notes (Signed)
 eLink Physician-Brief Progress Note Patient Name: Terry [name] DOB: 1961-03-01 MRN: [id number]   Date of Service  12/25/2023  HPI/Events of Note  CT abdomen with acute pancreatitis On insulin  and fluids Lipase 2,740  eICU Interventions  Ordered triglyceride level as part of work up [employer]  and fluids for now Ordered morphine  prn for pain Ordered additional doses of metoclopromide for the hiccups Discussed with BSRN     Intervention Category Intermediate Interventions: Diagnostic test evaluation  [name] 12/25/2023, 1:39 AM

## 2023-12-25 NOTE — TOC CM/SW Note (Signed)
 Transition of Care [facility]) - Inpatient Brief Assessment   Patient Details  Name: Terry [name] MRN: [id number] Date of Birth: 25-May-1960  [facility] CM/SW Contact:    Lauraine FORBES Saa, LCSWA Phone Number: 12/25/2023, 4:29 PM   Clinical Narrative:  4:29 PM Per chart review, patient resides at home. Patient has a PCP and insurance. Patient does not have SNF/HH/DME history. Patient's preferred pharmacy is [facility]. CSW provided SDOH (food) resources. No other TOC needs identified at this time. TOC will continue to follow and be available to assist.  Transition of Care Asessment: Insurance and Status: Insurance coverage has been reviewed Patient has primary care physician: Yes Home environment has been reviewed: Private Residence Prior level of function:: N/A Prior/Current Home Services: No current home services Social Drivers of Health Review: SDOH reviewed interventions complete Readmission risk has been reviewed: Yes (Currently Yellow 16%) Transition of care needs: no transition of care needs at this time

## 2023-12-25 NOTE — Procedures (Signed)
 Central Venous Catheter Insertion Procedure Note  Refael Fulop  [id number]  09/12/1960  Date:12/25/23  Time:4:00 PM   Provider Performing:Aleiya Rye LOISE Blush   Procedure: Insertion of Non-tunneled Central Venous Catheter(36556) with US  guidance (23062)   Indication(s) Plasmapheresis  Consent Risks of the procedure as well as the alternatives and risks of each were explained to the patient and/or caregiver.  Consent for the procedure was obtained and is signed in the bedside chart  Anesthesia Topical only with 1% lidocaine    Timeout Verified patient identification, verified procedure, site/side was marked, verified correct patient position, special equipment/implants available, medications/allergies/relevant history reviewed, required imaging and test results available.  Sterile Technique Maximal sterile technique including full sterile barrier drape, hand hygiene, sterile gown, sterile gloves, mask, hair covering, sterile ultrasound probe cover (if used).  Procedure Description Area of catheter insertion was cleaned with chlorhexidine  and draped in sterile fashion.  With real-time ultrasound guidance a HD catheter was placed into the left internal jugular vein. Nonpulsatile blood flow and easy flushing noted in all ports.  The catheter was sutured in place and sterile dressing applied.  Complications/Tolerance None; patient tolerated the procedure well. Chest X-ray is ordered to verify placement for internal jugular or subclavian cannulation.   EBL Minimal  Specimen(s) None  Rexene LOISE Blush, [place] [facility] 12/25/23 4:00 PM  Please see [email or web address] for pager details.  From 7A-7P if no response, please call [phone] After hours, please call ELink [phone]

## 2023-12-25 NOTE — Progress Notes (Signed)
 NAME:  Terry [name], MRN:  [id number], DOB:  [birth year], LOS: 1 ADMISSION DATE:  12/24/2023, CONSULTATION DATE:  12/24/2023 REFERRING MD:  TRH, CHIEF COMPLAINT: DKA  History of Present Illness:  Terry [name] is a 63 year old male with a past medical history significant for type 2 diabetes, HTN, HLD, GERD, and osteoarthritis who presented to the ED at [facility] via private vehicle with complaints of nausea and vomiting that began 1 day prior to admission.  Also reports shortness of breath that began the evening of admission.  Denies any cough, fever, chills but does also endorse increased thirst.  On ED arrival patient was seen hypothermic, tachycardic, tachypneic, and mildly hypotensive  Lab work significant for NA 120, chloride 78, CO2 less than 7, glucose 1091, creatinine 3.21, GFR 21, WBC 27.8, beta-hydroxybutyrate acid greater than 8, high-sensitivity troponin 53, and lactic acid greater than 9.  Given concern for septic shock with associated DKA patient was transferred to [facility] for admission under critical care.  At arrival to [facility] ICU - he  was off bipap. RR 30, feeling much better. Says he had to stop dexcom mo itoring 2 weeks ago becusae of waiting for [employer] approval. Denies fever, chills, non-compliance. REports this is 2nd episode in life - last was a year ago.   Pertinent  Medical History   type 2 diabetes, HTN, HLD, GERD, and osteoarthritis    has a past medical history of Diabetes mellitus without complication (HCC) (2004), GERD (gastroesophageal reflux disease), Hyperlipidemia, Hypertension, and Osteoarthritis.   reports that he quit smoking about 31 years ago. His smoking use included cigarettes. He has never used smokeless tobacco.  Past Surgical History:  Procedure Laterality Date   ANKLE SURGERY Left 03/28/2005   2 plates/screws   COLONOSCOPY N/A 08/04/2017   Procedure: COLONOSCOPY;  Surgeon: Golda Claudis PENNER, MD;  Location[facility];  Service: Endoscopy;  Laterality: N/A;   ESOPHAGEAL DILATION N/A 08/04/2017   Procedure: ESOPHAGEAL DILATION;  Surgeon: Golda Claudis PENNER, MD;  Location: [facility] SU[facility];  Service: Endoscopy;  Laterality: N/A;   ESOPHAGOGASTRODUODENOSCOPY N/A 08/04/2017   Procedure: ESOPHAGOGASTRODUODENOSCOPY (EGD);  Surgeon: Golda Claudis PENNER, MD;  Location: [facility];  Service: Endoscopy;  Laterality: N/A;   POLYPECTOMY  08/04/2017   Procedure: POLYPECTOMY;  Surgeon: Golda Claudis PENNER, MD;  Location: [facility];  Service: Endoscopy;;   ROOT CANAL      Allergies  Allergen Reactions   Invokana  [Canagliflozin ] Other (See Comments)    laryngitis   Trulicity  [Dulaglutide ] Swelling    Patient also did not tolerate because of blistering in the mouth would avoid all GLP-1's patient was hospitalized with diabetic ketoacidosis August 2023   Altace [Ramipril] Cough   Metformin  And Related Diarrhea    Immunization History  Administered Date(s) Administered   Influenza, Seasonal, Injecte, Preservative Fre 12/20/2022   Influenza,inj,Quad PF,6+ Mos 01/30/2014, 01/29/2016, 01/30/2017, 01/26/2018, 01/23/2019, 01/17/2020, 01/01/2021   Influenza-Unspecified 01/26/2012, 02/21/2015, 01/10/2022   PNEUMOCOCCAL CONJUGATE-20 03/21/2023   Pneumococcal Polysaccharide-23 01/30/2014   Td 12/19/2014    Family History  Problem Relation Age of Onset   Hypertension Mother    Diabetes Mother    Heart disease Mother    Hyperlipidemia Mother    Cancer Father        lung     Current Facility-Administered Medications:    Chlorhexidine  Gluconate Cloth 2 % PADS 6 each, 6 each, Topical, Daily, Geronimo Amel, MD, 6 each at 12/24/23 2222  dextrose  5 % in lactated ringers  infusion, , Intravenous, Continuous, Franklyn Sid SAILOR, MD, Last Rate: 125 mL/hr at 12/25/23 0638, New Bag at 12/25/23 9361   dextrose  50 % solution 0-50 mL, 0-50 mL, Intravenous, PRN, Franklyn Sid SAILOR, MD   heparin injection 5,000 Units, 5,000 Units,  Subcutaneous, Q8H, Geronimo Amel, MD, 5,000 Units at 12/25/23 0509   HYDROmorphone (DILAUDID) injection 1 mg, 1 mg, Intravenous, Q2H PRN, Aventura, Emily T, MD, 1 mg at 12/25/23 0219   insulin  regular, human (MYXREDLIN ) 100 units/ 100 mL infusion, , Intravenous, Continuous, Franklyn Sid SAILOR, MD, Last Rate: 13 mL/hr at 12/25/23 0600, 13 Units/hr at 12/25/23 0600   lactated ringers  infusion, , Intravenous, Continuous, Franklyn Sid SAILOR, MD, Stopped at 12/24/23 2325   metoCLOPramide  (REGLAN ) injection 10 mg, 10 mg, Intravenous, Q6H PRN, Aventura, Emily T, MD, 10 mg at 12/25/23 0150   norepinephrine (LEVOPHED) 4-5 MG/250ML-% infusion SOLN, , , ,    oxyCODONE -acetaminophen  (PERCOCET/ROXICET) 5-325 MG per tablet 1 tablet, 1 tablet, Oral, Q8H PRN, 1 tablet at 12/24/23 1536 **AND** oxyCODONE  (Oxy IR/ROXICODONE ) immediate release tablet 5 mg, 5 mg, Oral, Q8H PRN, Geronimo Amel, MD   potassium PHOSPHATE  30 mmol in dextrose  5 % 500 mL infusion, 30 mmol, Intravenous, Once, Marion Damien DASEN, MD, Paused at 12/25/23 0553   prochlorperazine  (COMPAZINE ) injection 10 mg, 10 mg, Intravenous, Q4H PRN, Geronimo Amel, MD, 10 mg at 12/24/23 2330    Significant Hospital Events: Including procedures, antibiotic start and stop dates in addition to other pertinent events   9/28 presented with complaints of nausea, vomiting, increased thirst and shortness of breath workup revealed concern for evolving septic shock and DKA At arrival to [facility] ICU - he  was off bipap. RR 30, feeling much better. Says he had to stop dexcom mo itoring 2 weeks ago becusae of waiting for [employer] approval. Denies fever, chills, non-compliance  DKA parametes improved Denies CAD, stroke, Cancers, lung disease, smoking 9/28 ICU as above 9/28 pancreatitis on CT abdomen and pelvis with hypertriglyceridemia, on EndoTool 9/28 CT abdomen and pelvis with ggo in bilateral lungs, concerning for infection  Interim History /  Subjective:   Awake, responding to questions. Endorses abdominal pain. No cough, URI symptoms otherwise. Daughter at the bedside. Wife and daughter agree with decision making of potentially doing plasmapheresis for ongoing hypertriglyceridemia if no significant improvement in the past 24 hours   Objective    Blood pressure 120/69, pulse (!) 115, temperature 98.2 F (36.8 C), resp. rate (!) 25, height 5' 11 (1.803 m), weight 99.8 kg, SpO2 97%.    FiO2 (%):  [35 %-36 %] 36 % PEEP:  [6 cmH20] 6 cmH20   Intake/Output Summary (Last 24 hours) at 12/25/2023 0733 Last data filed at 12/25/2023 0600 Gross per 24 hour  Intake 4789.43 ml  Output 1875 ml  Net 2914.43 ml   Filed Weights   12/24/23 0643  Weight: 99.8 kg    Examination: General Appearance:  Ill appearing, more alert, elderly male Head:  atraumatic Eyes:  EOM, pupils reactive to light Throat:  dry mucus membranes Lungs: CTAB  with mild crackles in bilateral bases Heart:  S1 and S2 normal, no murmur, CVP - no.  Pressors - no Abdomen:  soft, distended, tender in the L quadrants Extremities:  warm and dry, non edematous Neurologic: Awake and responding to questions. Moving all extremities volitionally, proper thought content  Significant labs reviewed: cbc, BMP, Triglycerides, ABG Significant imaging 9/29CT A/p, RUQ US , CXR  reviewed  Resolved problem list  Circulatory shock Assessment and Plan   Pancreatitis Elevated Triglycerides History of high TG 2 years ago.  S/p albumin and continuous fluid resuscitation 1700 on admission>1016 this AM on EndoTool. HDS currently, but given high TG and labilae blood pressure, may consider plasmapharesis today - Continue LR 125 ml/hr - Continue pain magement - Continue Endotool  - Will need to resume aspirin , Zetia , fibrate, and Crestor  at discharge - Will monitor TG for now; likely plasmapheresis today - RUQ ultrasound 9/29: no cholecystolithiasis or GBW  thickening  DKA Uncontrolled diabetes Likely triggered by poor DM control and acute pancreatitis as above - Hemoglobin A1c 9.7 6/20 - On EndoTool and D5LR  - AG closed x2, but continuing for hyper TG control  Acute Hypoxic Respiratory Failure  Peripheral airspace disease c/f PNA on CT A/p CXT  9/29 CXR with new bilateral infiltrates L>R. Will need to monitor for ARDS Currently on 4L [place] with Sat >95% - Aspiration precautions - Started on Zosyn therapy to also cover potential intraabdominal infection - Once off EndoTool, will need low cholesterol/fat diet  Leukocytosis No Fevers in the past 24 hrs. Multiple potential sources of infection vs reactive in setting of inflammation 27.8 > 14.5 - started on Zosyn  Acute Kidney Injury superimposed on CKD stage IIIa Cr 1.97 > 1.67 today with fluid resuscitation  Hyponatremia At high risk for Low Mag an dLow Phos Baseline sodium ranges between 137-144. Still looks volume down.  Will continue monitoring with volume resuscitation  Essential hypertension Hyperlipidemia Mild aortic stenosis Holding PTA Norvasc , Toprol -XL, losartan  10/2023 TTE with LV hypertrophy and LVEF 60-65% and mild AS  Hepatic steatosis Seen on CT A/p and RUQ US . Ast 135 on admission - will trend  Chronic pain on chronic opiate therapy - on PTA oxycodone  -acetaminophen  5-325 mg q8HR PRN - Dilaudid 1 mg IV q2HR PRN for breakthrough pain   SIGNATURE   Hadassah Kristy Ahr, MD - PGY3 [facility] Internal Medicine Program  12/25/2023, 7:34 AM   LABS    PULMONARY Recent Labs  Lab 12/24/23 0709 12/24/23 1010  HCO3 4.5* 6.8*  O2SAT 85.5 49    CBC Recent Labs  Lab 12/24/23 0625 12/25/23 0227  HGB 13.6 14.9  HCT 40.0 40.3  WBC 27.8* 14.5*  PLT 247 187    COAGULATION Recent Labs  Lab 12/24/23 0840  INR 1.1    CARDIAC  No results for input(s): TROPONINI in the last 168 hours. No results for input(s): PROBNP in the last 168  hours.   CHEMISTRY Recent Labs  Lab 12/24/23 9374 12/24/23 0933 12/24/23 0951 12/24/23 1846 12/24/23 2047 12/25/23 0227  NA 120* 122* 121*  --  131* 131*  K 4.7 4.9 5.1  --  3.4* 3.7  CL 78* 84* 83*  --  96* 98  CO2 <7* <7* <7*  --  17* 20*  GLUCOSE 1,091* 1,021* 1,033*  --  261* 269*  BUN 43* 43* 45*  --  30* 28*  CREATININE 3.21* 2.98* 3.10*  --  1.90* 1.97*  CALCIUM  9.4 8.3* 8.2*  --  8.3* 7.8*  MG  --   --   --  2.0  --  1.6*  PHOS  --   --   --  <1.0*  --  1.8*   Estimated Creatinine Clearance: 46.8 mL/min (A) (by C-G formula based on SCr of 1.97 mg/dL (H)).   LIVER Recent Labs  Lab 12/24/23 0840 12/25/23 0227  AST  --  135*  ALT  --  34  ALKPHOS  --  32*  BILITOT  --  1.1  PROT  --  6.5  ALBUMIN  --  3.1*  INR 1.1  --      INFECTIOUS Recent Labs  Lab 12/24/23 0933 12/25/23 0227 12/25/23 0513  LATICACIDVEN 8.6* 2.9* 2.6*  PROCALCITON  --  1.54  --      ENDOCRINE CBG (last 3)  Recent Labs    12/25/23 0507 12/25/23 0622 12/25/23 0652  GLUCAP 249* 222* 226*     IMAGING x48h  CT ABDOMEN PELVIS WO CONTRAST Result Date: 12/25/2023 CLINICAL DATA:  Abdominal pain EXAM: CT ABDOMEN AND PELVIS WITHOUT CONTRAST TECHNIQUE: Multidetector CT imaging of the abdomen and pelvis was performed following the standard protocol without IV contrast. RADIATION DOSE REDUCTION: This exam was performed according to the departmental dose-optimization program which includes automated exposure control, adjustment of the mA and/or kV according to patient size and/or use of iterative reconstruction technique. COMPARISON:  None Available. FINDINGS: Lower chest: Extensive airspace disease in both lower lobes, mostly peripheral. No effusions. Coronary artery and aortic atherosclerosis. Hepatobiliary: Insert fatty CT Pancreas: Extensive stranding noted surrounding the pancreas and extending in the anterior pararenal spaces bilaterally compatible with acute pancreatitis. No ductal  dilatation. Spleen: No focal abnormality.  Normal size. Adrenals/Urinary Tract: No adrenal abnormality. No focal renal abnormality. No stones or hydronephrosis. Urinary bladder is unremarkable. Stomach/Bowel: The duodenum is likely secondarily inflamed. Stomach is decompressed. Remainder of the small bowel and large bowel decompressed and unremarkable. No bowel obstruction. Vascular/Lymphatic: Aortic atherosclerosis. No evidence of aneurysm or adenopathy. Reproductive: No visible focal abnormality. Other: No free fluid or free air. Musculoskeletal: No acute bony abnormality. IMPRESSION: Extensive peripheral airspace disease in the lower lobes. Cannot exclude pneumonia. Extensive changes of acute pancreatitis as described above. Coronary artery disease, aortic atherosclerosis. Hepatic steatosis Electronically Signed   By: Franky Crease M.D.   On: 12/25/2023 01:02   DG Abd 1 View Result Date: 12/24/2023 CLINICAL DATA:  Abdominal distension, nausea, vomiting EXAM: ABDOMEN - 1 VIEW COMPARISON:  None Available. FINDINGS: The bowel gas pattern is normal. No radio-opaque calculi or other significant radiographic abnormality are seen. IMPRESSION: Negative. Electronically Signed   By: Franky Crease M.D.   On: 12/24/2023 21:25   DG Chest Portable 1 View Result Date: 12/24/2023 CLINICAL DATA:  63 year old male with abnormal pulmonary auscultation. Shortness of breath. EXAM: PORTABLE CHEST 1 VIEW COMPARISON:  Portable chest 0727 hours today and earlier. FINDINGS: Portable AP view at 0953 hours. Unchanged lung volumes, mediastinal contours which remain within normal limits. Visualized tracheal air column is within normal limits. Chronic symmetric pulmonary interstitial markings appear stable from 2023. No pneumothorax, pleural effusion or confluent lung opacity. Paucity of bowel gas. Stable visualized osseous structures. IMPRESSION: Stable, No acute cardiopulmonary abnormality. Electronically Signed   By: VEAR Hurst M.D.   On:  12/24/2023 10:36   DG Chest Portable 1 View Result Date: 12/24/2023 EXAM: 1 VIEW(S) XRAY OF THE CHEST 12/24/2023 07:44:24 AM COMPARISON: 11/23/21 CLINICAL HISTORY: SOB. Per Triage: Pt arrives via POV from home with c/o N/V starting yesterday and SHOB this evening. Hx type 2 diabetic. Pt tachypneic, thirsty. States last insulin  dose was at lunch time. BGL reading high. FINDINGS: LUNGS AND PLEURA: Low lung volumes. No focal pulmonary opacity. No pulmonary edema. No pleural effusion. No pneumothorax. HEART AND MEDIASTINUM: No acute abnormality of the cardiac and mediastinal silhouettes. BONES AND SOFT TISSUES: No acute osseous abnormality. IMPRESSION:  1. No acute cardiopulmonary process. 2. Low lung volumes. Electronically signed by: Waddell Calk MD 12/24/2023 09:09 AM EDT RP Workstation: [id number]

## 2023-12-25 NOTE — Progress Notes (Signed)
 eLink Physician-Brief Progress Note Patient Name: Cori Henningsen DOB: 1960-05-18 MRN: [id number]   Date of Service  12/25/2023  HPI/Events of Note  Still with hiccups causing much discomfort and pain despite Reglan   eICU Interventions  Ordered a one time dose of thorazine 12.5 mg Discussed with CHAR Perkins T Haniyyah Sakuma 12/25/2023, 2:45 AM

## 2023-12-25 NOTE — Discharge Instructions (Addendum)
  Food Assistance  [facility] Address: [address], [place], [place] [address]  Phone: [phone]  Open the 2nd Thursday of each month, 4:30pm to 6:30pm  [facility] Address: [address], [place], [place] [address]  Phone: [phone]  Open on Tuesday/Wednesday/Thursday, 9:00am to 12:00pm  [employer] Address: [address], [place], [place] [address]  Phone: [phone]  Open the second and fourth Tuesday of each month, 10:00am - 1:00pm  [employer] [address] Address: [address] [place], [place], [place] [address]  Phone: [phone]  Open the second Saturday of each month, 9:00am - 9:30am  [employer] Address: [address] [place], [place] [address]  Phone: [phone]  Open the third Tuesday of the month, 12:00pm - 2:00pm. Locations vary each month. Call for more information.  [facility] Address: [address], [place], [place] [address]  Phone: [phone]  Office open Monday - Wednesday, 10:00am - 1:00pm. Food Distribution: Every other Thursday, 9:00am - 12:00pm Call office or visit website to confirm exact dates.  [facility] Address: [address] [place], [place] [address]  Phone: [phone]  Open the fourth Wednesday of each month, 9:00am - 12:00pm  [employer] [facility] Address: [address] [place], [place] [address]  Phone: [phone]  Open Thursdays 9:30am - 12:00pm  Food and Clothing Assistance - [employer] Address: [address], [place], [place] [address]  Phone: [phone]  Open Monday 9:00am - 11:45am; Wednesday 9:00am - 11:45am; Thursday 9:00am - 11:45am; Friday 9:00am - 11:45am  Food Distribution  [employer] [phone]  (M?F; 9am-4:15pm)  [employer] [phone]  (W&Th;  9:30am-12pm)  [employer] [phone]  (3rd Sat of Every Mnth; 9am-10:30am)  [facility] [phone]  (Every Other Th; 9am-12pm)  Meal Programs  [facility] [place]: [phone], (Meals on Wheels) [place]: [phone],  [place]: [phone],  [place]: [phone]  Crisis and Emergency   Assistance/Food Stamps (DSS) [phone] Food and Nutrition Services   Museum/gallery exhibitions officer Stamps) (DSS) [phone] Faustine Spencer la [place] [phone] LOT [phone] Men in [place], [place]. [phone] [facility] "[employer]" [phone] [employer] [address], [place] [phone] [employer], [place] [phone] [employer] [phone]    Local Endocrinologists [facility] [phone]) Dr. Lela Fendt Dr. Obadiah Birmingham Dr. [place] Thapa Dr. Donell "Abby" [facility] [phone]) Dr. Reyes Alexander Dr. Odella Jacobson [facility] [phone]) Dr. Littie Caffey Dr. Floreen Josephina Harlene CHRISTELLA. Roy, FNP [facility] [phone]) Dr. Garnette Nichole [facility] [phone]) [[place] office]  [phone])  Dr. Eleanor Leep Dr. Debby Breaker [facility] [phone]) Dr. Carlos CHARM Rao Autumn Hudnall Bonney), PA Dr. Elizbeth Blanch Dr. Elsie Claudene Raddle. Elodia Pouch, PA [facility]) [phone] [place]) [phone]) Dr. Donnice Dale Warren Hyacinth, NP [facility] [phone]) Dr. Ranny Lenis Benton Therisa, FNP [facility] [phone]) Dr. Marce Rucks

## 2023-12-25 NOTE — Inpatient Diabetes Management (Signed)
 Inpatient Diabetes Program Recommendations  AACE/ADA: New Consensus Statement on Inpatient Glycemic Control (2015)  Target Ranges:  Prepandial:   less than 140 mg/dL      Peak postprandial:   less than 180 mg/dL (1-2 hours)      Critically ill patients:  140 - 180 mg/dL   Lab Results  Component Value Date   GLUCAP 253 (H) 12/25/2023   HGBA1C 9.7 (A) 09/15/2023    Review of Glycemic Control  Latest Reference Range & Units 12/25/23 08:01 12/25/23 09:18 12/25/23 10:43  Glucose-Capillary 70 - 99 mg/dL 747 (H) 763 (H) 746 (H)   Diabetes history: DM Outpatient Diabetes medications:  Dexcom G7 sensor Glucotrol  XL 5 mg daily Humalog  30-36 units tid with meals Toujeo  130 units q HS Current orders for Inpatient glycemic control:  IV insulin    Inpatient Diabetes Program Recommendations:    Note admit with DKA.  Triglycerides>1000 mg/dL.  Continues to be on IV insulin .  Likely will need to continue until Triglycerides are lower.  Note that patient was on large doses of insulin  prior to admit.  Unsure if patient was taking insulin  prior to admission.  Will follow.   Thanks,  Randall Bullocks, RN, BC-ADM Inpatient Diabetes Coordinator Pager [phone]  (8a-5p)

## 2023-12-26 LAB — BASIC METABOLIC PANEL WITH GFR
Anion gap: 10 (ref 5–15)
Anion gap: 10 (ref 5–15)
Anion gap: 12 (ref 5–15)
Anion gap: 12 (ref 5–15)
Anion gap: 8 (ref 5–15)
BUN: 17 mg/dL (ref 8–23)
BUN: 19 mg/dL (ref 8–23)
BUN: 19 mg/dL (ref 8–23)
BUN: 19 mg/dL (ref 8–23)
BUN: 20 mg/dL (ref 8–23)
CO2: 20 mmol/L — ABNORMAL LOW (ref 22–32)
CO2: 21 mmol/L — ABNORMAL LOW (ref 22–32)
CO2: 22 mmol/L (ref 22–32)
CO2: 23 mmol/L (ref 22–32)
CO2: 23 mmol/L (ref 22–32)
Calcium: 6.9 mg/dL — ABNORMAL LOW (ref 8.9–10.3)
Calcium: 7 mg/dL — ABNORMAL LOW (ref 8.9–10.3)
Calcium: 7.1 mg/dL — ABNORMAL LOW (ref 8.9–10.3)
Calcium: 7.2 mg/dL — ABNORMAL LOW (ref 8.9–10.3)
Calcium: 7.2 mg/dL — ABNORMAL LOW (ref 8.9–10.3)
Chloride: 100 mmol/L (ref 98–111)
Chloride: 100 mmol/L (ref 98–111)
Chloride: 100 mmol/L (ref 98–111)
Chloride: 99 mmol/L (ref 98–111)
Chloride: 99 mmol/L (ref 98–111)
Creatinine, Ser: 1.47 mg/dL — ABNORMAL HIGH (ref 0.61–1.24)
Creatinine, Ser: 1.48 mg/dL — ABNORMAL HIGH (ref 0.61–1.24)
Creatinine, Ser: 1.54 mg/dL — ABNORMAL HIGH (ref 0.61–1.24)
Creatinine, Ser: 1.55 mg/dL — ABNORMAL HIGH (ref 0.61–1.24)
Creatinine, Ser: 1.56 mg/dL — ABNORMAL HIGH (ref 0.61–1.24)
GFR, Estimated: 50 mL/min — ABNORMAL LOW (ref >60)
GFR, Estimated: 50 mL/min — ABNORMAL LOW (ref >60)
GFR, Estimated: 51 mL/min — ABNORMAL LOW (ref >60)
GFR, Estimated: 53 mL/min — ABNORMAL LOW (ref >60)
GFR, Estimated: 54 mL/min — ABNORMAL LOW (ref >60)
Glucose, Bld: 106 mg/dL — ABNORMAL HIGH (ref 70–99)
Glucose, Bld: 140 mg/dL — ABNORMAL HIGH (ref 70–99)
Glucose, Bld: 143 mg/dL — ABNORMAL HIGH (ref 70–99)
Glucose, Bld: 160 mg/dL — ABNORMAL HIGH (ref 70–99)
Glucose, Bld: 168 mg/dL — ABNORMAL HIGH (ref 70–99)
Potassium: 3.1 mmol/L — ABNORMAL LOW (ref 3.5–5.1)
Potassium: 3.1 mmol/L — ABNORMAL LOW (ref 3.5–5.1)
Potassium: 3.3 mmol/L — ABNORMAL LOW (ref 3.5–5.1)
Potassium: 3.4 mmol/L — ABNORMAL LOW (ref 3.5–5.1)
Potassium: 3.4 mmol/L — ABNORMAL LOW (ref 3.5–5.1)
Sodium: 131 mmol/L — ABNORMAL LOW (ref 135–145)
Sodium: 131 mmol/L — ABNORMAL LOW (ref 135–145)
Sodium: 132 mmol/L — ABNORMAL LOW (ref 135–145)
Sodium: 132 mmol/L — ABNORMAL LOW (ref 135–145)
Sodium: 133 mmol/L — ABNORMAL LOW (ref 135–145)

## 2023-12-26 LAB — CBC WITH DIFFERENTIAL/PLATELET
Abs Immature Granulocytes: 0.03 K/uL (ref 0.00–0.07)
Basophils Absolute: 0 K/uL (ref 0.0–0.1)
Basophils Relative: 1 %
Eosinophils Absolute: 0 K/uL (ref 0.0–0.5)
Eosinophils Relative: 0 %
HCT: 33 % — ABNORMAL LOW (ref 39.0–52.0)
Hemoglobin: 11.9 g/dL — ABNORMAL LOW (ref 13.0–17.0)
Immature Granulocytes: 0 %
Lymphocytes Relative: 12 %
Lymphs Abs: 0.9 K/uL (ref 0.7–4.0)
MCH: 30.5 pg (ref 26.0–34.0)
MCHC: 36.1 g/dL — ABNORMAL HIGH (ref 30.0–36.0)
MCV: 84.6 fL (ref 80.0–100.0)
Monocytes Absolute: 0.4 K/uL (ref 0.1–1.0)
Monocytes Relative: 5 %
Neutro Abs: 6.6 K/uL (ref 1.7–7.7)
Neutrophils Relative %: 82 %
Platelets: 120 K/uL — ABNORMAL LOW (ref 150–400)
RBC: 3.9 MIL/uL — ABNORMAL LOW (ref 4.22–5.81)
RDW: 13.1 % (ref 11.5–15.5)
Smear Review: NORMAL
WBC: 8 K/uL (ref 4.0–10.5)
nRBC: 0 % (ref 0.0–0.2)

## 2023-12-26 LAB — GLUCOSE, CAPILLARY
Glucose-Capillary: 101 mg/dL — ABNORMAL HIGH (ref 70–99)
Glucose-Capillary: 107 mg/dL — ABNORMAL HIGH (ref 70–99)
Glucose-Capillary: 111 mg/dL — ABNORMAL HIGH (ref 70–99)
Glucose-Capillary: 116 mg/dL — ABNORMAL HIGH (ref 70–99)
Glucose-Capillary: 117 mg/dL — ABNORMAL HIGH (ref 70–99)
Glucose-Capillary: 118 mg/dL — ABNORMAL HIGH (ref 70–99)
Glucose-Capillary: 118 mg/dL — ABNORMAL HIGH (ref 70–99)
Glucose-Capillary: 119 mg/dL — ABNORMAL HIGH (ref 70–99)
Glucose-Capillary: 130 mg/dL — ABNORMAL HIGH (ref 70–99)
Glucose-Capillary: 143 mg/dL — ABNORMAL HIGH (ref 70–99)
Glucose-Capillary: 148 mg/dL — ABNORMAL HIGH (ref 70–99)
Glucose-Capillary: 158 mg/dL — ABNORMAL HIGH (ref 70–99)
Glucose-Capillary: 168 mg/dL — ABNORMAL HIGH (ref 70–99)
Glucose-Capillary: 168 mg/dL — ABNORMAL HIGH (ref 70–99)
Glucose-Capillary: 171 mg/dL — ABNORMAL HIGH (ref 70–99)
Glucose-Capillary: 172 mg/dL — ABNORMAL HIGH (ref 70–99)
Glucose-Capillary: 177 mg/dL — ABNORMAL HIGH (ref 70–99)
Glucose-Capillary: 182 mg/dL — ABNORMAL HIGH (ref 70–99)
Glucose-Capillary: 91 mg/dL (ref 70–99)

## 2023-12-26 LAB — COMPREHENSIVE METABOLIC PANEL WITH GFR
ALT: 22 U/L (ref 0–44)
AST: 54 U/L — ABNORMAL HIGH (ref 15–41)
Albumin: 2.4 g/dL — ABNORMAL LOW (ref 3.5–5.0)
Alkaline Phosphatase: 26 U/L — ABNORMAL LOW (ref 38–126)
Anion gap: 9 (ref 5–15)
BUN: 19 mg/dL (ref 8–23)
CO2: 23 mmol/L (ref 22–32)
Calcium: 7.1 mg/dL — ABNORMAL LOW (ref 8.9–10.3)
Chloride: 100 mmol/L (ref 98–111)
Creatinine, Ser: 1.45 mg/dL — ABNORMAL HIGH (ref 0.61–1.24)
GFR, Estimated: 54 mL/min — ABNORMAL LOW (ref >60)
Glucose, Bld: 160 mg/dL — ABNORMAL HIGH (ref 70–99)
Potassium: 3.1 mmol/L — ABNORMAL LOW (ref 3.5–5.1)
Sodium: 132 mmol/L — ABNORMAL LOW (ref 135–145)
Total Bilirubin: 1.1 mg/dL (ref 0.0–1.2)
Total Protein: 5.4 g/dL — ABNORMAL LOW (ref 6.5–8.1)

## 2023-12-26 LAB — EXPECTORATED SPUTUM ASSESSMENT W GRAM STAIN, RFLX TO RESP C

## 2023-12-26 LAB — HEMOGLOBIN A1C
Hgb A1c MFr Bld: 11.6 % — ABNORMAL HIGH (ref 4.8–5.6)
Mean Plasma Glucose: 286.22 mg/dL

## 2023-12-26 LAB — TSH: TSH: 0.41 u[IU]/mL (ref 0.350–4.500)

## 2023-12-26 LAB — STREP PNEUMONIAE URINARY ANTIGEN: Strep Pneumo Urinary Antigen: NEGATIVE

## 2023-12-26 LAB — TRIGLYCERIDES: Triglycerides: 675 mg/dL — ABNORMAL HIGH (ref <150)

## 2023-12-26 MED ORDER — ENSURE MAX PROTEIN PO LIQD
11.0000 [oz_av] | Freq: Two times a day (BID) | ORAL | Status: DC
  Administered 2023-12-28: 11 [oz_av] via ORAL
  Filled 2023-12-26 (×5): qty 330

## 2023-12-26 MED ORDER — INSULIN ASPART 100 UNIT/ML IJ SOLN: 0.0000 [IU] | INTRAMUSCULAR | Status: DC

## 2023-12-26 MED ORDER — INSULIN (MYXREDLIN) INFUSION FOR HYPERTRIGLYCERIDEMIA
0.1000 [IU]/kg/h | INTRAVENOUS | Status: AC
  Administered 2023-12-26 – 2023-12-27 (×3): 0.1 [IU]/kg/h via INTRAVENOUS
  Filled 2023-12-26 (×4): qty 100

## 2023-12-26 MED ORDER — CALCIUM CARBONATE ANTACID 500 MG PO CHEW
1.0000 | CHEWABLE_TABLET | Freq: Three times a day (TID) | ORAL | Status: AC | PRN
  Administered 2023-12-27 (×3): 200 mg via ORAL
  Filled 2023-12-26 (×3): qty 1

## 2023-12-26 MED ORDER — POTASSIUM CHLORIDE CRYS ER 20 MEQ PO TBCR
40.0000 meq | EXTENDED_RELEASE_TABLET | ORAL | Status: AC
Start: 2023-12-26 — End: 2023-12-26
  Administered 2023-12-26 (×2): 40 meq via ORAL
  Filled 2023-12-26 (×2): qty 2

## 2023-12-26 MED ORDER — HYDROMORPHONE HCL 1 MG/ML IJ SOLN
1.0000 mg | Freq: Three times a day (TID) | INTRAMUSCULAR | Status: DC | PRN
Start: 2023-12-26 — End: 2023-12-28
  Administered 2023-12-27 – 2023-12-28 (×3): 1 mg via INTRAVENOUS
  Filled 2023-12-26 (×3): qty 1

## 2023-12-26 MED ORDER — ADULT MULTIVITAMIN W/MINERALS CH
1.0000 | ORAL_TABLET | Freq: Every day | ORAL | Status: DC
  Administered 2023-12-26 – 2023-12-28 (×3): 1 via ORAL
  Filled 2023-12-26 (×3): qty 1

## 2023-12-26 MED ORDER — DEXTROSE 5 % IV SOLN: INTRAVENOUS | Status: AC

## 2023-12-26 NOTE — Progress Notes (Addendum)
 Initial Nutrition Assessment  DOCUMENTATION CODES:  Not applicable  INTERVENTION:  Advance diet to heart healthy/carb modified per MD request Encourage PO intake, if still not taking much in within 24-48 hours, recommend liberalizing to a standard carb modified diet. MVI with minerals Provided with nutrition education and advice for managing diet Will refer to outpatient education for additional support and education May benefit from additional resources in the home if pt qualifies, i.e meals on wheels, information about caregiver services or respite care, simplifying medication regimen Ensure Max po BID, each supplement provides 150 kcal and 30 grams of protein.    NUTRITION DIAGNOSIS:  Inadequate oral intake related to poor appetite as evidenced by per patient/family report.  GOAL:  Patient will meet greater than or equal to 90% of their needs  MONITOR:  PO intake, Supplement acceptance  REASON FOR ASSESSMENT:  Consult Assessment of nutrition requirement/status, Diet education  ASSESSMENT:  Pt with hx of type 2 DM, GERD, HTN, and HLD presented to ED with N/V. Workup concerning for septic shock and DKA. Admitted to ICU for insulin  drip.  Pt resting in bedside chair at the time of assessment. Still on a clear liquid diet and has no appetite, MD plan to advance him today to solid food. Pt reports that appetite was poor for several days PTA as well as he was feeling poorly. Denies abdominal pain today.  Pt reports on a typical day when he is feeling well. He eats 2 meals (skips breakfast) and consumes mostly diet sodas. Has been trying to drink more water . Pt reports for lunch he will usually have something like a sandwich and dinner varies. Discussed recent changes in routine or life that may have contributed to the changes seen in pt's HgbA1c and triglyceride levels. Pt reports that he has been taking on more household tasks, one of which is cooking/preparing meals. States that  previously his wife was doing the meal preparation but he is now doing the majority of the meals. Tends to fix he and his wife things that are quick and on hand.   Discussed medication compliance as well. Pt reports taking all of his prescriptions as planned. States he has 14 pills each day in addition to his insulin . Suspect that this regimen needs to be simplified and that pt may also benefit from some resources to prevent caregiver burnout with the addition of having to assume more roles at home. Pt reports that he is retired now.  Pt reports that his endocrinologist told him the best thing he could do for his health was to start eating vegan. Pt states this is not feasible or something he is interested in doing. Validated pt's feelings that switching to a vegan diet would be extreme and not a realistic option for him. Did discuss simple, easy to implement choices for pt that would help with managing glucose levels and triglycerides. Encouraged fiber with each meal and to stop skipping breakfast. Also encouraged moderate exercise or any type of increase in physical activity. Pt with a family hx of HLD and DM.   Will refer to outpatient education as inpatient/ICU is not an idea learning environment.   Pt discussed during ICU rounds and with RN and MD. Insulin  to remain for management of high triglycerides, SSI to be added to cover meals.  Admit / Current weight: 99.8 kg    Intake/Output Summary (Last 24 hours) at 12/26/2023 1432 Last data filed at 12/26/2023 1000 Gross per 24 hour  Intake 4106.66 ml  Output 850 ml  Net 3256.66 ml  Net IO Since Admission: 6,910.75 mL [12/26/23 1432]  Nutritionally Relevant Medications: Continuous Infusions:  insulin  12 Units/hr (12/26/23 0647)   piperacillin-tazobactam (ZOSYN)  IV 3.375 g (12/26/23 0341)   PRN Meds: dextrose , prochlorperazine   Labs Reviewed: Sodium 131 Potassium 3.1 Creatinine 1.47 CBG ranges from 143-253 mg/dL over the last 24  hours HgbA1c 11.6%  Triglycerides 675 (was 1,716 on 9/28)  NUTRITION - FOCUSED PHYSICAL EXAM: Flowsheet Row Most Recent Value  Orbital Region No depletion  Upper Arm Region No depletion  Thoracic and Lumbar Region No depletion  Buccal Region No depletion  Temple Region No depletion  Clavicle Bone Region No depletion  Clavicle and Acromion Bone Region No depletion  Scapular Bone Region No depletion  Dorsal Hand No depletion  Patellar Region No depletion  Anterior Thigh Region No depletion  Posterior Calf Region No depletion  Edema (RD Assessment) None  Hair Reviewed  Eyes Reviewed  Mouth Reviewed  Skin Reviewed  Nails Reviewed    Diet Order:   Diet Order             Diet heart healthy/carb modified Room service appropriate? Yes with Assist; Fluid consistency: Thin  Diet effective now                   EDUCATION NEEDS:  Education needs have been addressed  Skin:  Skin Assessment: Reviewed RN Assessment  Last BM:  9/28  Height:  Ht Readings from Last 1 Encounters:  12/24/23 5' 11 (1.803 m)    Weight:  Wt Readings from Last 1 Encounters:  12/24/23 99.8 kg    Ideal Body Weight:  78.2 kg  BMI:  Body mass index is 30.68 kg/m.  Estimated Nutritional Needs:  Kcal:  2000-2200 kcal/d Protein:  100-115g/d Fluid:  2.2L/d    Vernell Lukes, RD, LDN, CNSC Registered Dietitian II Please reach out via secure chat

## 2023-12-26 NOTE — Plan of Care (Signed)
  Problem: Education: Goal: Knowledge of General Education information will improve Description: Including pain rating scale, medication(s)/side effects and non-pharmacologic comfort measures Outcome: Progressing   Problem: Clinical Measurements: Goal: Diagnostic test results will improve Outcome: Progressing   Problem: Nutrition: Goal: Adequate nutrition will be maintained Outcome: Not Progressing   Problem: Coping: Goal: Level of anxiety will decrease Outcome: Progressing   Problem: Pain Managment: Goal: General experience of comfort will improve and/or be controlled Outcome: Progressing   Problem: Coping: Goal: Ability to adjust to condition or change in health will improve Outcome: Progressing

## 2023-12-26 NOTE — Progress Notes (Addendum)
 eLink Physician-Brief Progress Note Patient Name: Terry [name] DOB: Oct 31, 1960 MRN: [id number]   Date of Service  12/26/2023  HPI/Events of Note  Pancreatitis with elevated Triglycerides. Has been on Insulin  gtt. The RN is to titrate the D5W based on the hourly insulin  checks. Goal range is 110-180. Currenly RN has the D5 at max of 75  eICU Interventions  Increase ceiling of D5W   2229 - Patient requesting PRN heartburn medication. Says it burns when he burps. Add Tums  Intervention Category Minor Interventions: Routine modifications to care plan (e.g. PRN medications for pain, fever)  Kaydyn Sayas 12/26/2023, 8:21 PM

## 2023-12-26 NOTE — Progress Notes (Signed)
 NAME:  Terry [name], MRN:  [id number], DOB:  [birth year], LOS: 2 ADMISSION DATE:  12/24/2023, CONSULTATION DATE:  12/24/2023 REFERRING MD:  TRH, CHIEF COMPLAINT: DKA  History of Present Illness:  Terry [name] is a 63 year old male with a past medical history significant for type 2 diabetes, HTN, HLD, GERD, and osteoarthritis who presented to the ED at [facility] via private vehicle with complaints of nausea and vomiting that began 1 day prior to admission.  Also reports shortness of breath that began the evening of admission.  Denies any cough, fever, chills but does also endorse increased thirst.  On ED arrival patient was seen hypothermic, tachycardic, tachypneic, and mildly hypotensive  Lab work significant for NA 120, chloride 78, CO2 less than 7, glucose 1091, creatinine 3.21, GFR 21, WBC 27.8, beta-hydroxybutyrate acid greater than 8, high-sensitivity troponin 53, and lactic acid greater than 9.  Given concern for septic shock with associated DKA patient was transferred to [facility] for admission under critical care.  At arrival to [facility] ICU - he  was off bipap. RR 30, feeling much better. Says he had to stop dexcom mo itoring 2 weeks ago becusae of waiting for [employer] approval. Denies fever, chills, non-compliance. REports this is 2nd episode in life - last was a year ago.   Pertinent  Medical History   type 2 diabetes, HTN, HLD, GERD, and osteoarthritis    has a past medical history of Diabetes mellitus without complication (HCC) (2004), GERD (gastroesophageal reflux disease), Hyperlipidemia, Hypertension, and Osteoarthritis.   reports that he quit smoking about 31 years ago. His smoking use included cigarettes. He has never used smokeless tobacco.  Past Surgical History:  Procedure Laterality Date   ANKLE SURGERY Left 03/28/2005   2 plates/screws   COLONOSCOPY N/A 08/04/2017   Procedure: COLONOSCOPY;  Surgeon: Golda Claudis PENNER, MD;  Location: [facility];  Service: Endoscopy;  Laterality: N/A;   ESOPHAGEAL DILATION N/A 08/04/2017   Procedure: ESOPHAGEAL DILATION;  Surgeon: Golda Claudis PENNER, MD;  Location: [facility];  Service: Endoscopy;  Laterality: N/A;   ESOPHAGOGASTRODUODENOSCOPY N/A 08/04/2017   Procedure: ESOPHAGOGASTRODUODENOSCOPY (EGD);  Surgeon: Golda Claudis PENNER, MD;  Location: [facility];  Service: Endoscopy;  Laterality: N/A;   POLYPECTOMY  08/04/2017   Procedure: POLYPECTOMY;  Surgeon: Golda Claudis PENNER, MD;  Location: [facility];  Service: Endoscopy;;   ROOT CANAL      Allergies  Allergen Reactions   Invokana  [Canagliflozin ] Other (See Comments)    laryngitis   Trulicity  [Dulaglutide ] Swelling    Patient also did not tolerate because of blistering in the mouth would avoid all GLP-1's patient was hospitalized with diabetic ketoacidosis August 2023   Altace [Ramipril] Cough   Metformin  And Related Diarrhea    Immunization History  Administered Date(s) Administered   Influenza, Seasonal, Injecte, Preservative Fre 12/20/2022   Influenza,inj,Quad PF,6+ Mos 01/30/2014, 01/29/2016, 01/30/2017, 01/26/2018, 01/23/2019, 01/17/2020, 01/01/2021   Influenza-Unspecified 01/26/2012, 02/21/2015, 01/10/2022   PNEUMOCOCCAL CONJUGATE-20 03/21/2023   Pneumococcal Polysaccharide-23 01/30/2014   Td 12/19/2014    Family History  Problem Relation Age of Onset   Hypertension Mother    Diabetes Mother    Heart disease Mother    Hyperlipidemia Mother    Cancer Father        lung     Current Facility-Administered Medications:    Chlorhexidine  Gluconate Cloth 2 % PADS 6 each, 6 each, Topical, Daily, Geronimo Amel, MD, 6 each at 12/24/23 2222  dextrose  5 % solution, , Intravenous, Continuous, Pawar, Rahul, MD   dextrose  50 % solution 0-50 mL, 0-50 mL, Intravenous, PRN, Naasz, Hayley N, MD   heparin injection 5,000 Units, 5,000 Units, Subcutaneous, Q8H, Geronimo Amel, MD, 5,000 Units at 12/26/23 0532   HYDROmorphone  (DILAUDID) injection 1 mg, 1 mg, Intravenous, Q8H PRN, Pawar, Rahul, MD   insulin  (MYXREDLIN ) 100 units/100 mL infusion for hypertriglyceridemia-induced pancreatitis, 0.1 Units/kg/hr, Intravenous, Continuous, Pawar, Rahul, MD   oxyCODONE -acetaminophen  (PERCOCET/ROXICET) 5-325 MG per tablet 1 tablet, 1 tablet, Oral, Q8H PRN, 1 tablet at 12/25/23 2150 **AND** oxyCODONE  (Oxy IR/ROXICODONE ) immediate release tablet 5 mg, 5 mg, Oral, Q8H PRN, Geronimo Amel, MD, 5 mg at 12/26/23 0131   piperacillin-tazobactam (ZOSYN) IVPB 3.375 g, 3.375 g, Intravenous, Q8H, Pawar, Rahul, MD, Last Rate: 12.5 mL/hr at 12/26/23 1053, 3.375 g at 12/26/23 1053   potassium chloride  SA (KLOR-CON  M) CR tablet 40 mEq, 40 mEq, Oral, Q4H, Merilee Linsey I, RPH, 40 mEq at 12/26/23 1054   prochlorperazine  (COMPAZINE ) injection 10 mg, 10 mg, Intravenous, Q4H PRN, Geronimo Amel, MD, 10 mg at 12/26/23 1136    Significant Hospital Events: Including procedures, antibiotic start and stop dates in addition to other pertinent events   9/28 presented with complaints of nausea, vomiting, increased thirst and shortness of breath workup revealed concern for evolving septic shock and DKA At arrival to [facility] ICU - he  was off bipap. RR 30, feeling much better. Says he had to stop dexcom mo itoring 2 weeks ago becusae of waiting for [employer] approval. Denies fever, chills, non-compliance  DKA parametes improved Denies CAD, stroke, Cancers, lung disease, smoking 9/28 ICU as above 9/28 pancreatitis on CT abdomen and pelvis with hypertriglyceridemia, on EndoTool 9/28 CT abdomen and pelvis with ggo in bilateral lungs, concerning for infection 9/30 AGMA resolved. TG 1716>1016 on EndoTool. Distributive shock physiology  Interim History / Subjective:  Daughter and wife at bedside. Drinking more water . Pain controlled. No appetite this AM. Had been having diarrhea for about a week prior to admission with URI symptom prior to  that.    Objective    Blood pressure (!) 164/78, pulse (!) 117, temperature 99 F (37.2 C), temperature source Oral, resp. rate (!) 31, height 5' 11 (1.803 m), weight 99.8 kg, SpO2 96%.   2L [place] with SaO2 >96%     Intake/Output Summary (Last 24 hours) at 12/26/2023 1151 Last data filed at 12/26/2023 1000 Gross per 24 hour  Intake 4385.52 ml  Output 1285 ml  Net 3100.52 ml   Filed Weights   12/24/23 0643  Weight: 99.8 kg    Examination: General Appearance:  Alert male in NAD Head:  atraumatic Eyes:  EOM full, pupil equal and reactive Throat:  MMM Lungs: Mild crackles bilaterally, no wheezing Heart: tachycardia Abdomen:  soft, mildly distended, +BS, tenderness over left quadrant Extremities: warm and dry. No peripheral edema Neurologic: Alert and oriented x4  Reviewed CBC, CMP, triglycerides  Resolved problem list  Circulatory shock Leukocytosis AKI Assessment and Plan   Pancreatitis Elevated Triglycerides History of high TG 2 years ago.  1700 on admission>1016>675 this AM on EndoTool. HDS this AM with improving HR. - Continue pain magement - Will transition off EndoTool to IV insulin  at 0.1un/Kg/hr until tomorrow AM - Continue BMP q6 Hrs for electrolyte replacement - Trend TG tomorrow AM; transition off IV insulin  when TG <500 - Will need to resume aspirin , Zetia , fibrate, and Crestor  at discharge - RUQ ultrasound 9/29:  no cholecystolithiasis or GBW thickening - TSH for hypertriglyceridemia work up; normal - remove HD catheter orders placed  Uncontrolled diabetes DKA now resolved. Transitioning off EndoTool for DKA (resolved on 9/29) to IV insulin  0.1units/kg/hr - Continue on D5LR  - if hyperglycemic, down-titrate D5 infusion rate  - if hypoglycemic, up-titrate D5 infusion rate - Hemoglobin A1c 9.7 6/20 - Will transition this insulin  protocol once triglycerides are <500; likely on 10/1  Acute Hypoxic Respiratory Failure  +Rhinovirus Viral vs post viral  PNA 9/29 CXR with new bilateral infiltrates L>R.  - Currently on 1L [place] with Sat >95% - Aspiration precautions - Follow up urinary legionella, strep - 9/29 -Zosyn, continue for now - Will consider 10/1 CTX transition to complete 5 day (10/3)  Leukocytosis, resolved No Fevers in the past 24 hrs. Now with rhinovirus and bilateral Multiple potential sources of infection vs reactive in setting of inflammation 27.8 > 14.5>8. Likely in the setting of pancreatitis >infection. - started on Zosyn on 9/30 - Abx as above  Acute Kidney Injury , resolved CKD stage IIIa Cr 1.97 > 1.67 >1.54 today with fluid resuscitation  Hyponatremia At high risk for Low Mag an dLow Phos Baseline sodium ranges between 137-144. Still looks volume down. - Continuing of fluids while of Iv insulij - Will continue monitoring physical exam and BP to assess volume resuscitation needs - Check magnesium tomorrow  Essential hypertension Hyperlipidemia Mild aortic stenosis Holding PTA Norvasc , Toprol -XL, losartan  10/2023 TTE with LV hypertrophy and LVEF 60-65% and mild AS  Hepatic steatosis Seen on CT A/p and RUQ US .  -  stable  Chronic pain on chronic opiate therapy - Encourage PTA oxycodone  - acetaminophen  5-325 mg q8HR PRN - Dilaudid 1 mg IV q8HR PRN for breakthrough pain  Nutrition Clear liquid diet ADAT  SIGNATURE   Hadassah Kristy Ahr, MD - PGY3 [facility]  12/26/2023, 11:51 AM   LABS    PULMONARY Recent Labs  Lab 12/24/23 0709 12/24/23 1010 12/25/23 1131  PHART  --   --  7.424  PCO2ART  --   --  33.5  PO2ART  --   --  81*  HCO3 4.5* 6.8* 21.9  TCO2  --   --  23  O2SAT 85.5 49 96    CBC Recent Labs  Lab 12/24/23 0625 12/25/23 0227 12/25/23 1131 12/26/23 0658  HGB 13.6 14.9 11.9* 11.9*  HCT 40.0 40.3 35.0* 33.0*  WBC 27.8* 14.5*  --  8.0  PLT 247 187  --  120*    COAGULATION Recent Labs  Lab 12/24/23 0840  INR 1.1    CARDIAC  No results  for input(s): TROPONINI in the last 168 hours. No results for input(s): PROBNP in the last 168 hours.   CHEMISTRY Recent Labs  Lab 12/24/23 1846 12/24/23 2047 12/25/23 0227 12/25/23 1131 12/25/23 1853 12/25/23 2338 12/26/23 0417 12/26/23 0658 12/26/23 0936  NA 131*   < > 131*   < > 134* 133* 132* 132* 131*  K 3.6   < > 3.7   < > 3.5 3.4* 3.4* 3.1* 3.1*  CL 94*   < > 98   < > 100 99 100 100 100  CO2 16*   < > 20*   < > 22 22 20* 23 23  GLUCOSE 319*   < > 269*   < > 178* 143* 168* 160* 160*  BUN 32*   < > 28*   < > 17 17 19 19  19  CREATININE 2.13*   < > 1.97*   < > 1.66* 1.55* 1.54* 1.45* 1.47*  CALCIUM  8.8*   < > 7.8*   < > 7.2* 7.2* 7.0* 7.1* 7.1*  MG 2.0  --  1.6*  --   --   --   --   --   --   PHOS <1.0*  --  1.8*  --   --   --   --   --   --    < > = values in this interval not displayed.   Estimated Creatinine Clearance: 62.7 mL/min (A) (by C-G formula based on SCr of 1.47 mg/dL (H)).   LIVER Recent Labs  Lab 12/24/23 0840 12/25/23 0227 12/26/23 0658  AST  --  135* 54*  ALT  --  34 22  ALKPHOS  --  32* 26*  BILITOT  --  1.1 1.1  PROT  --  6.5 5.4*  ALBUMIN  --  3.1* 2.4*  INR 1.1  --   --      INFECTIOUS Recent Labs  Lab 12/25/23 0227 12/25/23 0513 12/25/23 1807  LATICACIDVEN 2.9* 2.6* 2.1*  PROCALCITON 1.54  --   --      ENDOCRINE CBG (last 3)  Recent Labs    12/26/23 0736 12/26/23 0845 12/26/23 1025  GLUCAP 168* 171* 172*     IMAGING x48h  DG CHEST PORT 1 VIEW Result Date: 12/25/2023 CLINICAL DATA:  Central line EXAM: PORTABLE CHEST 1 VIEW COMPARISON:  12/25/2023, 12/24/2023 FINDINGS: Hypoventilatory changes. Enlarged mediastinal silhouette likely augmented by low lung volume. Left IJ central venous catheter with tip projecting over the upper peripheral SVC region allowing for patient rotation. Cardiomegaly with vascular congestion. Patchy bilateral airspace opacities. No pleural effusion or definitive pneumothorax. IMPRESSION: 1. Left  IJ central venous catheter with tip projecting over the upper peripheral SVC region allowing for patient rotation. No definitive pneumothorax. 2. Cardiomegaly with vascular congestion. Patchy bilateral airspace opacities may be due to edema or pneumonia. Electronically Signed   By: Luke Bun M.D.   On: 12/25/2023 16:44   DG CHEST PORT 1 VIEW Result Date: 12/25/2023 CLINICAL DATA:  ARDS. EXAM: PORTABLE CHEST 1 VIEW COMPARISON:  Chest radiograph dated 12/24/2023. FINDINGS: Shallow inspiration. There is increased interstitial markings, likely atelectasis. No focal consolidation, pleural effusion or pneumothorax. Stable cardiac silhouette. No acute osseous pathology. IMPRESSION: No acute cardiopulmonary process. Electronically Signed   By: Vanetta Chou M.D.   On: 12/25/2023 15:13   US  Abdomen Limited RUQ (LIVER/GB) Result Date: 12/25/2023 CLINICAL DATA:  6216 Acute pancreatitis 6216 EXAM: ULTRASOUND ABDOMEN LIMITED RIGHT UPPER QUADRANT COMPARISON:  12/25/2023 CT of the abdomen and pelvis FINDINGS: Gallbladder: No gallstones. Small amount of pericholecystic fluid. Positive sonographic Murphy's sign noted by sonographer. Common bile duct: Diameter: 4 mm Liver: Increased echogenicity. No focal lesion identified. No intrahepatic biliary ductal dilation. Portal vein is patent on color Doppler imaging with normal direction of blood flow towards the liver. Other: None. IMPRESSION: 1. Pericholecystic inflammation with a positive sonographic Murphy's sign, likely related to the upper abdominal inflammation from acute pancreatitis. No cholecystolithiasis or gallbladder wall thickening. 2. No intrahepatic or extrahepatic biliary ductal dilation. 3. Hepatic steatosis. Electronically Signed   By: Rogelia Myers M.D.   On: 12/25/2023 12:20   CT ABDOMEN PELVIS WO CONTRAST Result Date: 12/25/2023 CLINICAL DATA:  Abdominal pain EXAM: CT ABDOMEN AND PELVIS WITHOUT CONTRAST TECHNIQUE: Multidetector CT imaging of the  abdomen and pelvis was performed following the standard protocol  without IV contrast. RADIATION DOSE REDUCTION: This exam was performed according to the departmental dose-optimization program which includes automated exposure control, adjustment of the mA and/or kV according to patient size and/or use of iterative reconstruction technique. COMPARISON:  None Available. FINDINGS: Lower chest: Extensive airspace disease in both lower lobes, mostly peripheral. No effusions. Coronary artery and aortic atherosclerosis. Hepatobiliary: Insert fatty CT Pancreas: Extensive stranding noted surrounding the pancreas and extending in the anterior pararenal spaces bilaterally compatible with acute pancreatitis. No ductal dilatation. Spleen: No focal abnormality.  Normal size. Adrenals/Urinary Tract: No adrenal abnormality. No focal renal abnormality. No stones or hydronephrosis. Urinary bladder is unremarkable. Stomach/Bowel: The duodenum is likely secondarily inflamed. Stomach is decompressed. Remainder of the small bowel and large bowel decompressed and unremarkable. No bowel obstruction. Vascular/Lymphatic: Aortic atherosclerosis. No evidence of aneurysm or adenopathy. Reproductive: No visible focal abnormality. Other: No free fluid or free air. Musculoskeletal: No acute bony abnormality. IMPRESSION: Extensive peripheral airspace disease in the lower lobes. Cannot exclude pneumonia. Extensive changes of acute pancreatitis as described above. Coronary artery disease, aortic atherosclerosis. Hepatic steatosis Electronically Signed   By: Franky Crease M.D.   On: 12/25/2023 01:02   DG Abd 1 View Result Date: 12/24/2023 CLINICAL DATA:  Abdominal distension, nausea, vomiting EXAM: ABDOMEN - 1 VIEW COMPARISON:  None Available. FINDINGS: The bowel gas pattern is normal. No radio-opaque calculi or other significant radiographic abnormality are seen. IMPRESSION: Negative. Electronically Signed   By: Franky Crease M.D.   On: 12/24/2023  21:25

## 2023-12-26 NOTE — Inpatient Diabetes Management (Signed)
 Inpatient Diabetes Program Recommendations  AACE/ADA: New Consensus Statement on Inpatient Glycemic Control (2015)  Target Ranges:  Prepandial:   less than 140 mg/dL      Peak postprandial:   less than 180 mg/dL (1-2 hours)      Critically ill patients:  140 - 180 mg/dL   Lab Results  Component Value Date   GLUCAP 171 (H) 12/26/2023   HGBA1C 11.6 (H) 12/26/2023    Review of Glycemic Control  Latest Reference Range & Units 12/26/23 03:38 12/26/23 04:42 12/26/23 05:32 12/26/23 06:45 12/26/23 07:36 12/26/23 08:45  Glucose-Capillary 70 - 99 mg/dL 817 (H) 831 (H) 856 (H) 177 (H) 168 (H) 171 (H)  (H): Data is abnormally high Diabetes history: DM Outpatient Diabetes medications:  Dexcom G7 sensor Glucotrol  XL 5 mg daily Humalog  30-36 units tid with meals Toujeo  130 units q HS Current orders for Inpatient glycemic control:  IV insulin     Inpatient Diabetes Program Recommendations:    Noted plan for IV insulin  for increased triglycerides. In agreement.  When ready for transition could consider: - Lantus  65 units two hours prior to discontinuation of IV insulin , then BID to follow - Novolog  8 units TID  - Novolog  0-15 units Q4H  Will follow  Thanks, Tinnie Minus, MSN, RNC-OB Diabetes Coordinator [phone] (8a-5p)

## 2023-12-27 ENCOUNTER — Encounter (HOSPITAL_COMMUNITY): Payer: Self-pay | Admitting: Internal Medicine

## 2023-12-27 LAB — GLUCOSE, CAPILLARY
Glucose-Capillary: 117 mg/dL — ABNORMAL HIGH (ref 70–99)
Glucose-Capillary: 117 mg/dL — ABNORMAL HIGH (ref 70–99)
Glucose-Capillary: 124 mg/dL — ABNORMAL HIGH (ref 70–99)
Glucose-Capillary: 129 mg/dL — ABNORMAL HIGH (ref 70–99)
Glucose-Capillary: 130 mg/dL — ABNORMAL HIGH (ref 70–99)
Glucose-Capillary: 134 mg/dL — ABNORMAL HIGH (ref 70–99)
Glucose-Capillary: 139 mg/dL — ABNORMAL HIGH (ref 70–99)
Glucose-Capillary: 143 mg/dL — ABNORMAL HIGH (ref 70–99)
Glucose-Capillary: 216 mg/dL — ABNORMAL HIGH (ref 70–99)
Glucose-Capillary: 240 mg/dL — ABNORMAL HIGH (ref 70–99)
Glucose-Capillary: 274 mg/dL — ABNORMAL HIGH (ref 70–99)

## 2023-12-27 LAB — BASIC METABOLIC PANEL WITH GFR
Anion gap: 10 (ref 5–15)
Anion gap: 12 (ref 5–15)
Anion gap: 11 (ref 5–15)
BUN: 16 mg/dL (ref 8–23)
BUN: 14 mg/dL (ref 8–23)
BUN: 15 mg/dL (ref 8–23)
CO2: 21 mmol/L — ABNORMAL LOW (ref 22–32)
CO2: 20 mmol/L — ABNORMAL LOW (ref 22–32)
CO2: 20 mmol/L — ABNORMAL LOW (ref 22–32)
Calcium: 7.3 mg/dL — ABNORMAL LOW (ref 8.9–10.3)
Calcium: 7.6 mg/dL — ABNORMAL LOW (ref 8.9–10.3)
Calcium: 7.6 mg/dL — ABNORMAL LOW (ref 8.9–10.3)
Chloride: 98 mmol/L (ref 98–111)
Chloride: 97 mmol/L — ABNORMAL LOW (ref 98–111)
Chloride: 98 mmol/L (ref 98–111)
Creatinine, Ser: 1.34 mg/dL — ABNORMAL HIGH (ref 0.61–1.24)
Creatinine, Ser: 1.22 mg/dL (ref 0.61–1.24)
Creatinine, Ser: 1.25 mg/dL — ABNORMAL HIGH (ref 0.61–1.24)
GFR, Estimated: 60 mL/min — ABNORMAL LOW (ref >60)
GFR, Estimated: >60 mL/min (ref >60)
GFR, Estimated: >60 mL/min (ref >60)
Glucose, Bld: 130 mg/dL — ABNORMAL HIGH (ref 70–99)
Glucose, Bld: 125 mg/dL — ABNORMAL HIGH (ref 70–99)
Glucose, Bld: 190 mg/dL — ABNORMAL HIGH (ref 70–99)
Potassium: 3.3 mmol/L — ABNORMAL LOW (ref 3.5–5.1)
Potassium: 3.5 mmol/L (ref 3.5–5.1)
Potassium: 4.3 mmol/L (ref 3.5–5.1)
Sodium: 129 mmol/L — ABNORMAL LOW (ref 135–145)
Sodium: 129 mmol/L — ABNORMAL LOW (ref 135–145)
Sodium: 129 mmol/L — ABNORMAL LOW (ref 135–145)

## 2023-12-27 LAB — CBC
HCT: 31.7 % — ABNORMAL LOW (ref 39.0–52.0)
Hemoglobin: 11.4 g/dL — ABNORMAL LOW (ref 13.0–17.0)
MCH: 30.4 pg (ref 26.0–34.0)
MCHC: 36 g/dL (ref 30.0–36.0)
MCV: 84.5 fL (ref 80.0–100.0)
Platelets: 146 K/uL — ABNORMAL LOW (ref 150–400)
RBC: 3.75 MIL/uL — ABNORMAL LOW (ref 4.22–5.81)
RDW: 13.1 % (ref 11.5–15.5)
WBC: 12.7 K/uL — ABNORMAL HIGH (ref 4.0–10.5)
nRBC: 0 % (ref 0.0–0.2)

## 2023-12-27 LAB — TRIGLYCERIDES: Triglycerides: 330 mg/dL — ABNORMAL HIGH (ref <150)

## 2023-12-27 LAB — MAGNESIUM: Magnesium: 2.1 mg/dL (ref 1.7–2.4)

## 2023-12-27 MED ORDER — INSULIN ASPART 100 UNIT/ML IJ SOLN
0.0000 [IU] | INTRAMUSCULAR | Status: DC
  Administered 2023-12-27: 11 [IU] via SUBCUTANEOUS
  Administered 2023-12-27 (×2): 7 [IU] via SUBCUTANEOUS
  Administered 2023-12-28 (×3): 4 [IU] via SUBCUTANEOUS

## 2023-12-27 MED ORDER — INSULIN GLARGINE 100 UNIT/ML ~~LOC~~ SOLN
25.0000 [IU] | Freq: Two times a day (BID) | SUBCUTANEOUS | Status: DC
  Administered 2023-12-27 – 2023-12-28 (×3): 25 [IU] via SUBCUTANEOUS
  Filled 2023-12-27 (×6): qty 0.25

## 2023-12-27 MED ORDER — INFLUENZA VIRUS VACC SPLIT PF (FLUZONE) 0.5 ML IM SUSY
0.5000 mL | PREFILLED_SYRINGE | INTRAMUSCULAR | Status: AC
  Administered 2023-12-28: 0.5 mL via INTRAMUSCULAR
  Filled 2023-12-27: qty 0.5

## 2023-12-27 MED ORDER — ACETAMINOPHEN 325 MG PO TABS
650.0000 mg | ORAL_TABLET | Freq: Once | ORAL | Status: DC
  Filled 2023-12-27: qty 2

## 2023-12-27 MED ORDER — LIVING WELL WITH DIABETES BOOK
Freq: Once | Status: AC
  Filled 2023-12-27: qty 1

## 2023-12-27 MED ORDER — CALCIUM CARBONATE ANTACID 500 MG PO CHEW
400.0000 mg | CHEWABLE_TABLET | ORAL | Status: DC | PRN
  Administered 2023-12-27 (×2): 400 mg via ORAL
  Filled 2023-12-27 (×2): qty 2

## 2023-12-27 MED ORDER — POTASSIUM CHLORIDE CRYS ER 20 MEQ PO TBCR
40.0000 meq | EXTENDED_RELEASE_TABLET | ORAL | Status: AC
  Administered 2023-12-27 (×2): 40 meq via ORAL
  Filled 2023-12-27 (×2): qty 2

## 2023-12-27 MED ORDER — SODIUM CHLORIDE 0.9 % IV SOLN
2.0000 g | INTRAVENOUS | Status: DC
  Administered 2023-12-27 – 2023-12-28 (×2): 2 g via INTRAVENOUS
  Filled 2023-12-27 (×2): qty 20

## 2023-12-27 MED ORDER — CALCIUM CARBONATE ANTACID 500 MG PO CHEW: 1.0000 | CHEWABLE_TABLET | Freq: Four times a day (QID) | ORAL | Status: DC | PRN

## 2023-12-27 MED ORDER — METOPROLOL SUCCINATE ER 100 MG PO TB24
100.0000 mg | ORAL_TABLET | Freq: Every day | ORAL | Status: DC
  Administered 2023-12-27 – 2023-12-28 (×2): 100 mg via ORAL
  Filled 2023-12-27: qty 1
  Filled 2023-12-27: qty 2

## 2023-12-27 MED ORDER — PANTOPRAZOLE SODIUM 40 MG PO TBEC
40.0000 mg | DELAYED_RELEASE_TABLET | Freq: Every day | ORAL | Status: DC
  Administered 2023-12-27 – 2023-12-28 (×2): 40 mg via ORAL
  Filled 2023-12-27 (×2): qty 1

## 2023-12-27 MED ORDER — POTASSIUM CHLORIDE 10 MEQ/100ML IV SOLN
10.0000 meq | INTRAVENOUS | Status: AC
  Administered 2023-12-27 (×4): 10 meq via INTRAVENOUS
  Filled 2023-12-27 (×4): qty 100

## 2023-12-27 NOTE — Evaluation (Signed)
 Physical Therapy Evaluation Patient Details Name: Terry [name] MRN: [id number] DOB: 13-Sep-1960 Today's Date: 12/27/2023  History of Present Illness  63 yo male presenting to [facility] with N/V and SOB. Pt workup revealed DKA and acute pancreatitis. PMH DM2 HTN HLD GERD OA L ankle surgery  Clinical Impression  Patient presents with decreased mobility due to generalized weakness, decreased balance, decreased activity tolerance and abdominal pain.  Previously independent, recently retired from [employer] work and living with his wife he reports has some memory issues.  Currently CGA for mobility in hallway though with HR to 124, reported fatigue.  Initiated education on energy conservation and feel he will benefit from continued skilled PT in the acute setting to progress balance and strength.  Likely will not need follow up PT at d/c.         If plan is discharge home, recommend the following: A little help with walking and/or transfers;Help with stairs or ramp for entrance;Assistance with cooking/housework;A little help with bathing/dressing/bathroom   Can travel by private vehicle        Equipment Recommendations None recommended by PT  Recommendations for Other Services       Functional Status Assessment Patient has had a recent decline in their functional status and demonstrates the ability to make significant improvements in function in a reasonable and predictable amount of time.     Precautions / Restrictions Precautions Precautions: Fall Precaution/Restrictions Comments: reports one recent fall at home chasing the cat under the car      Mobility  Bed Mobility               General bed mobility comments: in recliner, watiting for bath prior to returning to bed    Transfers Overall transfer level: Needs assistance   Transfers: Sit to/from Stand Sit to Stand: Contact guard assist           General transfer comment: due to lines, mild imbalance     Ambulation/Gait Ambulation/Gait assistance: Contact guard assist, +2 safety/equipment (IV/telemetry help) Gait Distance (Feet): 150 Feet Assistive device: None Gait Pattern/deviations: Step-through pattern, Step-to pattern, Decreased stride length, Wide base of support       General Gait Details: mild imbalance esp on turns, CGA for balance  Stairs            Wheelchair Mobility     Tilt Bed    Modified Rankin (Stroke Patients Only)       Balance Overall balance assessment: Needs assistance   Sitting balance-Leahy Scale: Good       Standing balance-Leahy Scale: Good Standing balance comment: stands eyes closed 10 sec, able to reach forward without LOB, picks up item from floor, though c/o increased abdominal pain               High Level Balance Comments: sudden stop, looks over both shoulders though not fully rotating with pain, walks backwards 5 steps             Pertinent Vitals/Pain Pain Assessment Pain Assessment: Faces Faces Pain Scale: Hurts even more Pain Location: abdomen (pancreatitis) Pain Descriptors / Indicators: Aching, Grimacing, Guarding Pain Intervention(s): Monitored during session, Limited activity within patient's tolerance    Home Living Family/patient expects to be discharged to:: Private residence Living Arrangements: Spouse/significant other Available Help at Discharge: Family Type of Home: House Home Access: Stairs to enter Entrance Stairs-Rails: None Entrance Stairs-Number of Steps: 4 in front, 6 in back   Home Layout: One level Home Equipment: None Additional  Comments: recently retired from [employer] work    Prior Function Prior Level of Function : Independent/Modified Independent               ADLs Comments: completes chores at home     Extremity/Trunk Assessment   Upper Extremity Assessment Upper Extremity Assessment: Defer to OT evaluation    Lower Extremity Assessment Lower Extremity  Assessment: Overall WFL for tasks assessed    Cervical / Trunk Assessment Cervical / Trunk Assessment: Normal  Communication   Communication Communication: No apparent difficulties    Cognition Arousal: Alert Behavior During Therapy: WFL for tasks assessed/performed   PT - Cognitive impairments: No apparent impairments                         Following commands: Intact       Cueing Cueing Techniques: Verbal cues     General Comments General comments (skin integrity, edema, etc.): HR 124, SpO2 99% on RA, BP 167/79    Exercises     Assessment/Plan    PT Assessment Patient needs continued PT services  PT Problem List Decreased strength;Decreased activity tolerance;Decreased balance;Pain;Decreased mobility       PT Treatment Interventions DME instruction;Gait training;Stair training;Functional mobility training;Therapeutic activities;Therapeutic exercise;Balance training;Patient/family education    PT Goals (Current goals can be found in the Care Plan section)  Acute Rehab PT Goals Patient Stated Goal: return home, help pain PT Goal Formulation: With patient Time For Goal Achievement: 01/10/24 Potential to Achieve Goals: Good    Frequency Min 2X/week     Co-evaluation PT/OT/SLP Co-Evaluation/Treatment: Yes Reason for Co-Treatment: To address functional/ADL transfers;Other (comment) (activity tolerance) PT goals addressed during session: Mobility/safety with mobility;Balance         AM-PAC PT 6 Clicks Mobility  Outcome Measure Help needed turning from your back to your side while in a flat bed without using bedrails?: A Little Help needed moving from lying on your back to sitting on the side of a flat bed without using bedrails?: A Little Help needed moving to and from a bed to a chair (including a wheelchair)?: A Little Help needed standing up from a chair using your arms (e.g., wheelchair or bedside chair)?: A Little Help needed to walk in  hospital room?: A Little Help needed climbing 3-5 steps with a railing? : Total 6 Click Score: 16    End of Session Equipment Utilized During Treatment: Gait belt Activity Tolerance: Patient limited by fatigue Patient left: in chair;with call bell/phone within reach;with nursing/sitter in room   PT Visit Diagnosis: Other abnormalities of gait and mobility (R26.89);Muscle weakness (generalized) (M62.81)    Time: 9087-9064 PT Time Calculation (min) (ACUTE ONLY): 23 min   Charges:   PT Evaluation $PT Eval Moderate Complexity: 1 Mod   PT General Charges $$ ACUTE PT VISIT: 1 Visit         Micheline Portal, PT Acute Rehabilitation Services [phone] 12/27/2023   Montie Portal 12/27/2023, 10:04 AM

## 2023-12-27 NOTE — Inpatient Diabetes Management (Signed)
 Inpatient Diabetes Program Recommendations  AACE/ADA: New Consensus Statement on Inpatient Glycemic Control   Target Ranges:  Prepandial:   less than 140 mg/dL      Peak postprandial:   less than 180 mg/dL (1-2 hours)      Critically ill patients:  140 - 180 mg/dL    Latest Reference Range & Units 12/27/23 04:06 12/27/23 06:23 12/27/23 07:43 12/27/23 10:22 12/27/23 11:38 12/27/23 12:57  Glucose-Capillary 70 - 99 mg/dL 882 (H) 869 (H) 865 (H) 124 (H) 129 (H) 117 (H)    Latest Reference Range & Units 12/26/23 04:17  Hemoglobin A1C 4.8 - 5.6 % 11.6 (H)   Review of Glycemic Control  Diabetes history: DM Outpatient Diabetes medications:  Dexcom G7 sensor Glucotrol  XL 5 mg daily Humalog  30-36 units tid with meals Toujeo  130 units q HS  Current orders for Inpatient glycemic control: IV insulin    Inpatient Diabetes Program Recommendations:   When ready for transition could consider: - Lantus  30 units two hours prior to discontinuation of IV insulin , then BID to follow - Novolog  8 units TID  - Novolog  0-15 units Q4H  Spoke with patient about diabetes and home regimen for diabetes control. Patient reports being followed by Dr Lenis for diabetes management; however, he wished to switch to a new Endocrinologist.  Patient reports taking DM medications as prescribed with no missed doses without improvement of his blood sugars.Patient reports using his Dexcom G7 CGM to monitor his blood sugars and states his blood sugars typically go up in the 300-400's mg/dl during the day after eating. Discussed A1C results 11.6% and explained that current A1C indicates an average glucose of 286 mg/dl over the past 2-3 months. Discussed glucose and A1C goals. Discussed importance of checking CBGs and maintaining good CBG control to prevent long-term and short-term complications. Explained how hyperglycemia leads to damage within blood vessels which lead to the common complications seen with uncontrolled diabetes.  Stressed to the patient the importance of improving glycemic control to prevent further complications from uncontrolled diabetes. Discussed impact of nutrition, exercise, stress, sickness, and medications on diabetes control. Patient states he typically eats out since he is the caregiver for him wife and struggles to make wise choices with food option since it is usually fast food.  Discussed carbohydrates, carbohydrate goals per day and meal, along with portion sizes. Patient verbalized understanding of information discussed and reports no further questions at this time related to diabetes.  Living Well With Diabetes book, Endo list added, Outpatient education, and Registered Dietician ordered placed.   Thanks,  Lavanda Search, RN, MSN, [facility]  Inpatient Diabetes Coordinator  Pager [phone] (8a-5p)

## 2023-12-27 NOTE — Progress Notes (Signed)
 [facility] ADULT ICU REPLACEMENT PROTOCOL   The patient does apply for the [facility] Adult ICU Electrolyte Replacment Protocol based on the criteria listed below:   1.Exclusion criteria: TCTS, ECMO, Dialysis, and Myasthenia Gravis patients 2. Is GFR >/= 30 ml/min? Yes.    Patient's GFR today is 60 3. Is SCr </= 2? Yes.   Patient's SCr is 1.34 mg/dL 4. Did SCr increase >/= 0.5 in 24 hours? No. 5.Pt's weight >40kg  Yes.   6. Abnormal electrolyte(s): Potassium  7. Electrolytes replaced per protocol 8.  Call MD STAT for K+ </= 2.5, Phos </= 1, or Mag </= 1 Physician:  Dr. Haze Medico A Shenica Holzheimer 12/27/2023 2:07 AM

## 2023-12-27 NOTE — Plan of Care (Signed)
 Patient still very nauseous, unable to maintain PO. Not progressing towards optimal weight. Please see comprehensive progressive note for this patient.

## 2023-12-27 NOTE — Evaluation (Signed)
 Occupational Therapy Evaluation Patient Details Name: Terry [name] MRN: [id number] DOB: 04/22/1960 Today's Date: 12/27/2023   History of Present Illness   63 yo male presenting to [facility] with N/V and SOB. Pt workup revealed DKA and acute pancreatitis. PMH DM2 HTN HLD GERD OA L ankle surgery     Clinical Impressions PT admitted with DKA and pancreatitis. Pt currently with functional limitiations due to the deficits listed below (see OT problem list). Pt admitted with glucose >1000 and reports extreme fatigue with activity. Pt at baseline indep and recently retired. Pt can benefit from continued energy conservation education.  Pt will benefit from skilled OT to increase their independence and safety with adls and balance to allow discharge home with family.      If plan is discharge home, recommend the following:   Assist for transportation     Functional Status Assessment   Patient has had a recent decline in their functional status and demonstrates the ability to make significant improvements in function in a reasonable and predictable amount of time.     Equipment Recommendations   Other (comment);BSC/3in1 (RW)     Recommendations for Other Services         Precautions/Restrictions   Precautions Precautions: Fall Precaution/Restrictions Comments: reports one recent fall at home chasing the cat under the car Restrictions Weight Bearing Restrictions Per Provider Order: No     Mobility Bed Mobility               General bed mobility comments: in recliner, watiting for bath prior to returning to bed    Transfers Overall transfer level: Needs assistance   Transfers: Sit to/from Stand Sit to Stand: Contact guard assist           General transfer comment: due to lines, mild imbalance      Balance Overall balance assessment: Needs assistance   Sitting balance-Leahy Scale: Good       Standing balance-Leahy Scale: Good                  High Level Balance Comments: sudden stop, looks over both shoulders though not fully rotating with pain, walks backwards 5 steps           ADL either performed or assessed with clinical judgement   ADL Overall ADL's : Needs assistance/impaired Eating/Feeding: Modified independent   Grooming: Wash/dry face   Upper Body Bathing: Set up   Lower Body Bathing: Minimal assistance   Upper Body Dressing : Set up   Lower Body Dressing: Minimal assistance   Toilet Transfer: Contact guard assist;BSC/3in1;Rolling walker (2 wheels)   Toileting- Clothing Manipulation and Hygiene: Minimal assistance       Functional mobility during ADLs: Contact guard assist;Rolling walker (2 wheels) General ADL Comments: noted to have increased HR with movement and fatigue. started conversation regarding energy conservation     Vision Baseline Vision/History: 1 Wears glasses Ability to See in Adequate Light: 0 Adequate Patient Visual Report: No change from baseline Vision Assessment?: No apparent visual deficits     Perception         Praxis         Pertinent Vitals/Pain Pain Assessment Pain Assessment: Faces Faces Pain Scale: Hurts even more Pain Location: abdomen (pancreatitis) Pain Descriptors / Indicators: Aching, Grimacing, Guarding Pain Intervention(s): Repositioned, Limited activity within patient's tolerance, Monitored during session     Extremity/Trunk Assessment Upper Extremity Assessment Upper Extremity Assessment: Defer to OT evaluation;Overall [facility] for tasks assessed   Lower  Extremity Assessment Lower Extremity Assessment: Defer to PT evaluation;Overall [facility] for tasks assessed   Cervical / Trunk Assessment Cervical / Trunk Assessment: Normal   Communication Communication Communication: No apparent difficulties   Cognition Arousal: Alert Behavior During Therapy: WFL for tasks assessed/performed Cognition: No apparent impairments                                Following commands: Intact       Cueing  General Comments   Cueing Techniques: Verbal cues  HR 124, SpO2 99% on RA, BP 167/79   Exercises     Shoulder Instructions      Home Living Family/patient expects to be discharged to:: Private residence Living Arrangements: Spouse/significant other Available Help at Discharge: Family Type of Home: House Home Access: Stairs to enter [employer] of Steps: 4 in front, 6 in back Entrance Stairs-Rails: None Home Layout: One level     Bathroom Shower/Tub: Producer, television/film/video: Handicapped height (has tall in one bathroom and std in another)     Home Equipment: None   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[employer], corporate work      Prior Functioning/Environment Prior Level of Function : Independent/Modified Independent               ADLs Comments: completes chores at home    OT Problem List: Decreased strength;Decreased activity tolerance;Impaired balance (sitting and/or standing);Decreased safety awareness;Decreased knowledge of use of DME or AE;Decreased knowledge of precautions;Cardiopulmonary status limiting activity   OT Treatment/Interventions: Self-care/ADL training;Energy conservation;DME and/or AE instruction;Therapeutic activities;Patient/family education;Balance training      OT Goals(Current goals can be found in the care plan section)   Acute Rehab OT Goals Patient Stated Goal: to return to laying down OT Goal Formulation: With patient Time For Goal Achievement: 01/10/24 Potential to Achieve Goals: Good   OT Frequency:  Min 2X/week    Co-evaluation PT/OT/SLP Co-Evaluation/Treatment: Yes Reason for Co-Treatment: To address functional/ADL transfers;Other (comment) (activity tolerance) PT goals addressed during session: Mobility/safety with mobility;Balance OT goals addressed during session: ADL's and self-care      AM-PAC OT 6 Clicks Daily Activity     Outcome Measure  Help from another person eating meals?: None Help from another person taking care of personal grooming?: None Help from another person toileting, which includes using toliet, bedpan, or urinal?: A Little Help from another person bathing (including washing, rinsing, drying)?: A Little Help from another person to put on and taking off regular upper body clothing?: A Little Help from another person to put on and taking off regular lower body clothing?: A Little 6 Click Score: 20   End of Session Equipment Utilized During Treatment: Rolling walker (2 wheels) Nurse Communication: Mobility status;Precautions  Activity Tolerance: Patient tolerated treatment well Patient left: in chair;with call bell/phone within reach;Other (comment) (CNA in room to help with hygiene)  OT Visit Diagnosis: Unsteadiness on feet (R26.81);Muscle weakness (generalized) (M62.81)                Time: 9084-9067 OT Time Calculation (min): 17 min Charges:  OT General Charges $OT Visit: 1 Visit OT Evaluation $OT Eval Moderate Complexity: 1 Mod   Brynn, OTR/L  Acute Rehabilitation Services Office: [phone] .   Ely Molt 12/27/2023, 1:28 PM

## 2023-12-27 NOTE — Progress Notes (Signed)
 NAME:  Terry [name], MRN:  [id number], DOB:  02/14/1961, LOS: 3 ADMISSION DATE:  12/24/2023, CONSULTATION DATE:  12/24/2023 REFERRING MD:  TRH, CHIEF COMPLAINT: DKA  History of Present Illness:  Terry [name] is a 63 year old male with a past medical history significant for type 2 diabetes, HTN, HLD, GERD, and osteoarthritis who presented to the ED at [facility] via private vehicle with complaints of nausea and vomiting that began 1 day prior to admission.  Also reports shortness of breath that began the evening of admission.  Denies any cough, fever, chills but does also endorse increased thirst.  On ED arrival patient was seen hypothermic, tachycardic, tachypneic, and mildly hypotensive  Lab work significant for NA 120, chloride 78, CO2 less than 7, glucose 1091, creatinine 3.21, GFR 21, WBC 27.8, beta-hydroxybutyrate acid greater than 8, high-sensitivity troponin 53, and lactic acid greater than 9.  Given concern for septic shock with associated DKA patient was transferred to [facility] for admission under critical care.  At arrival to [facility] ICU - he  was off bipap. RR 30, feeling much better. Says he had to stop dexcom mo itoring 2 weeks ago becusae of waiting for [employer] approval. Denies fever, chills, non-compliance. REports this is 2nd episode in life - last was a year ago.   Pertinent  Medical History   type 2 diabetes, HTN, HLD, GERD, and osteoarthritis    has a past medical history of Diabetes mellitus without complication (HCC) (2004), GERD (gastroesophageal reflux disease), Hyperlipidemia, Hypertension, and Osteoarthritis.   reports that he quit smoking about 31 years ago. His smoking use included cigarettes. He has never used smokeless tobacco.  Past Surgical History:  Procedure Laterality Date   ANKLE SURGERY Left 03/28/2005   2 plates/screws   COLONOSCOPY N/A 08/04/2017   Procedure: COLONOSCOPY;  Surgeon: Golda Claudis PENNER, MD;  Location: [facility];  Service: Endoscopy;  Laterality: N/A;   ESOPHAGEAL DILATION N/A 08/04/2017   Procedure: ESOPHAGEAL DILATION;  Surgeon: Golda Claudis PENNER, MD;  Location: [facility];  Service: Endoscopy;  Laterality: N/A;   ESOPHAGOGASTRODUODENOSCOPY N/A 08/04/2017   Procedure: ESOPHAGOGASTRODUODENOSCOPY (EGD);  Surgeon: Golda Claudis PENNER, MD;  Location: [facility];  Service: Endoscopy;  Laterality: N/A;   POLYPECTOMY  08/04/2017   Procedure: POLYPECTOMY;  Surgeon: Golda Claudis PENNER, MD;  Location: [facility];  Service: Endoscopy;;   ROOT CANAL      Allergies  Allergen Reactions   Invokana  [Canagliflozin ] Other (See Comments)    laryngitis   Trulicity  [Dulaglutide ] Swelling    Patient also did not tolerate because of blistering in the mouth would avoid all GLP-1's patient was hospitalized with diabetic ketoacidosis August 2023   Altace [Ramipril] Cough   Metformin  And Related Diarrhea    Immunization History  Administered Date(s) Administered   Influenza, Seasonal, Injecte, Preservative Fre 12/20/2022   Influenza,inj,Quad PF,6+ Mos 01/30/2014, 01/29/2016, 01/30/2017, 01/26/2018, 01/23/2019, 01/17/2020, 01/01/2021   Influenza-Unspecified 01/26/2012, 02/21/2015, 01/10/2022   PNEUMOCOCCAL CONJUGATE-20 03/21/2023   Pneumococcal Polysaccharide-23 01/30/2014   Td 12/19/2014    Family History  Problem Relation Age of Onset   Hypertension Mother    Diabetes Mother    Heart disease Mother    Hyperlipidemia Mother    Cancer Father        lung     Current Facility-Administered Medications:    acetaminophen  (TYLENOL ) tablet 650 mg, 650 mg, Oral, Once, Paliwal, Aditya, MD   calcium  carbonate (TUMS - dosed in  mg elemental calcium ) chewable tablet 200 mg of elemental calcium , 1 tablet, Oral, TID PRN, Paliwal, Aditya, MD, 200 mg of elemental calcium  at 12/27/23 0613   Chlorhexidine  Gluconate Cloth 2 % PADS 6 each, 6 each, Topical, Daily, Geronimo Amel, MD, 6 each at 12/26/23 1145    dextrose  5 % solution, , Intravenous, Continuous, Paliwal, Aditya, MD, Last Rate: 150 mL/hr at 12/27/23 0750, New Bag at 12/27/23 0750   dextrose  50 % solution 0-50 mL, 0-50 mL, Intravenous, PRN, Franklyn Sid SAILOR, MD   heparin injection 5,000 Units, 5,000 Units, Subcutaneous, Q8H, Geronimo Amel, MD, 5,000 Units at 12/27/23 9385   HYDROmorphone (DILAUDID) injection 1 mg, 1 mg, Intravenous, Q8H PRN, Pawar, Rahul, MD   insulin  (MYXREDLIN ) 100 units/100 mL infusion for hypertriglyceridemia-induced pancreatitis, 0.1 Units/kg/hr, Intravenous, Continuous, Pawar, Rahul, MD, Last Rate: 9.98 mL/hr at 12/27/23 0600, 0.1 Units/kg/hr at 12/27/23 0600   multivitamin with minerals tablet 1 tablet, 1 tablet, Oral, Daily, Gomez-Caraballo, Jamiya Nims, MD, 1 tablet at 12/26/23 1705   oxyCODONE -acetaminophen  (PERCOCET/ROXICET) 5-325 MG per tablet 1 tablet, 1 tablet, Oral, Q8H PRN, 1 tablet at 12/27/23 0613 **AND** oxyCODONE  (Oxy IR/ROXICODONE ) immediate release tablet 5 mg, 5 mg, Oral, Q8H PRN, Geronimo Amel, MD, 5 mg at 12/26/23 0131   piperacillin-tazobactam (ZOSYN) IVPB 3.375 g, 3.375 g, Intravenous, Q8H, Pawar, Rahul, MD, Last Rate: 12.5 mL/hr at 12/27/23 0600, Infusion Verify at 12/27/23 0600   potassium chloride  10 mEq in 100 mL IVPB, 10 mEq, Intravenous, Q1 Hr x 4, Gomez-Caraballo, Demon Volante, MD   prochlorperazine  (COMPAZINE ) injection 10 mg, 10 mg, Intravenous, Q4H PRN, Geronimo Amel, MD, 10 mg at 12/26/23 1136   protein supplement (ENSURE MAX) liquid, 11 oz, Oral, BID, Gomez-Caraballo, Khalaya Mcgurn, MD    Significant Hospital Events: Including procedures, antibiotic start and stop dates in addition to other pertinent events   9/28 presented with complaints of nausea, vomiting, increased thirst and shortness of breath workup revealed concern for evolving septic shock and DKA At arrival to [facility] ICU - he  was off bipap. RR 30, feeling much better. Says he had to stop dexcom mo itoring 2 weeks ago becusae of  waiting for [employer] approval. Denies fever, chills, non-compliance  DKA parametes improved Denies CAD, stroke, Cancers, lung disease, smoking 9/28 ICU as above 9/28 pancreatitis on CT abdomen and pelvis with hypertriglyceridemia, on EndoTool 9/28 CT abdomen and pelvis with ggo in bilateral lungs, concerning for infection 9/30 AGMA resolved. TG 1716>1016 on EndoTool. Distributive shock physiology  Interim History / Subjective:  Daughter and patient seen at bedside. Seen working with physical therapy ealier, prior to rounds, tolerating ambulation on RA Reports has been compliant with medication consistently. Prior to URI and diarrhea episodes >10 days ago. CGM fell off and couldn't monitor his BG.   NO appetite today but doing much better. Pain better controlled. No chest pain or shortness of breath today.    Objective    Blood pressure (!) 147/83, pulse (!) 110, temperature 99.1 F (37.3 C), temperature source Oral, resp. rate (!) 32, height 5' 11 (1.803 m), weight 99.8 kg, SpO2 93%. Room air >95% SaO2     Intake/Output Summary (Last 24 hours) at 12/27/2023 0856 Last data filed at 12/27/2023 0618 Gross per 24 hour  Intake 2389.16 ml  Output 2050 ml  Net 339.16 ml   Filed Weights   12/24/23 0643  Weight: 99.8 kg    Examination: General Appearance:  alert and in NAD Eyes:  EOM full, pupil equal and  reactive Throat:  MMM Lungs: Mild crackles B/L, improved Heart: RRR Abdomen:  +BS, mild tenderness to palpation Extremities: non edematous, warm and dry Neurologic: Alert and oriented x4  Reviewed CBC, CMP, triglycerides  Resolved problem list  Circulatory shock Leukocytosis AKI Assessment and Plan   Pancreatitis Elevated Triglycerides History of high TG 2 years ago. Patient adherent to medications. Suspect: URI>hyperglycemia> DKA> hyperTG >pancreatitis cascade 1700 on admission>1016>675> 330 this AM on Iv insulin  infusion. HDS this AM - Continue pain magement -  Transition off IV insulin  this AM - Continue BMP q6 HRS until  - Trend TG tomorrow AM; transition off IV insulin  when TG <500 - Will need to resume aspirin , Zetia , fibrate, and Crestor  at discharge - RUQ ultrasound 9/29: no cholecystolithiasis or GBW thickening - TSH for hypertriglyceridemia work up; normal  Uncontrolled diabetes - Transition to 25 LA BID and SSI given poor appetite - Hemoglobin A1c 11.6 this admission - Continues to have difficulty managing diabetes PTA. Previously seen by Endocrinologist but patient wishes to change specialist outpatient  +Rhinovirus Viral vs post viral PNA 9/29 CXR with new bilateral infiltrates L>R. Room air - Aspiration precautions - 9/29 -Zosyn - Transition to CTX transition to complete 5 day (10/3)  CKD stage IIIa Stable  Hyponatremia At high risk for Low Mag an dLow Phos Baseline sodium ranges between 137-144. Still looks volume down. - Encourage PO intake - Will continue monitoring physical exam and BP to assess volume resuscitation needs - Follow up mag  Essential hypertension Hyperlipidemia Mild aortic stenosis - Restart Toprol  10/1 - Holding PTA Norvasc ,  losartan  - 10/2023 TTE with LV hypertrophy and LVEF 60-65% and mild AS  Hepatic steatosis Seen on CT A/p and RUQ US .  -  stable  Chronic pain on chronic opiate therapy - Encourage PTA oxycodone  - acetaminophen  5-325 mg q8HR PRN - Dilaudid 1 mg IV q8HR PRN for breakthrough pain  Nutrition ADAT to low fat (HH/carb modified)  SIGNATURE   Hadassah Kristy Ahr, MD - PGY3 [facility] Internal Medicine Program  12/27/2023, 8:56 AM   LABS    PULMONARY Recent Labs  Lab 12/24/23 0709 12/24/23 1010 12/25/23 1131  PHART  --   --  7.424  PCO2ART  --   --  33.5  PO2ART  --   --  81*  HCO3 4.5* 6.8* 21.9  TCO2  --   --  23  O2SAT 85.5 49 96    CBC Recent Labs  Lab 12/24/23 0625 12/25/23 0227 12/25/23 1131 12/26/23 0658  HGB 13.6 14.9 11.9* 11.9*  HCT 40.0  40.3 35.0* 33.0*  WBC 27.8* 14.5*  --  8.0  PLT 247 187  --  120*    COAGULATION Recent Labs  Lab 12/24/23 0840  INR 1.1    CARDIAC  No results for input(s): TROPONINI in the last 168 hours. No results for input(s): PROBNP in the last 168 hours.   CHEMISTRY Recent Labs  Lab 12/24/23 1846 12/24/23 2047 12/25/23 0227 12/25/23 1131 12/26/23 0658 12/26/23 0936 12/26/23 1314 12/26/23 1758 12/27/23 0024  NA 131*   < > 131*   < > 132* 131* 132* 131* 129*  K 3.6   < > 3.7   < > 3.1* 3.1* 3.1* 3.3* 3.3*  CL 94*   < > 98   < > 100 100 99 100 98  CO2 16*   < > 20*   < > 23 23 23  21* 21*  GLUCOSE 319*   < >  269*   < > 160* 160* 140* 106* 130*  BUN 32*   < > 28*   < > 19 19 20 19 16   CREATININE 2.13*   < > 1.97*   < > 1.45* 1.47* 1.56* 1.48* 1.34*  CALCIUM  8.8*   < > 7.8*   < > 7.1* 7.1* 6.9* 7.2* 7.3*  MG 2.0  --  1.6*  --   --   --   --   --   --   PHOS <1.0*  --  1.8*  --   --   --   --   --   --    < > = values in this interval not displayed.   Estimated Creatinine Clearance: 68.8 mL/min (A) (by C-G formula based on SCr of 1.34 mg/dL (H)).   LIVER Recent Labs  Lab 12/24/23 0840 12/25/23 0227 12/26/23 0658  AST  --  135* 54*  ALT  --  34 22  ALKPHOS  --  32* 26*  BILITOT  --  1.1 1.1  PROT  --  6.5 5.4*  ALBUMIN  --  3.1* 2.4*  INR 1.1  --   --      INFECTIOUS Recent Labs  Lab 12/25/23 0227 12/25/23 0513 12/25/23 1807  LATICACIDVEN 2.9* 2.6* 2.1*  PROCALCITON 1.54  --   --      ENDOCRINE CBG (last 3)  Recent Labs    12/27/23 0406 12/27/23 0623 12/27/23 0743  GLUCAP 117* 130* 134*     IMAGING x48h  DG CHEST PORT 1 VIEW Result Date: 12/25/2023 CLINICAL DATA:  Central line EXAM: PORTABLE CHEST 1 VIEW COMPARISON:  12/25/2023, 12/24/2023 FINDINGS: Hypoventilatory changes. Enlarged mediastinal silhouette likely augmented by low lung volume. Left IJ central venous catheter with tip projecting over the upper peripheral SVC region allowing for  patient rotation. Cardiomegaly with vascular congestion. Patchy bilateral airspace opacities. No pleural effusion or definitive pneumothorax. IMPRESSION: 1. Left IJ central venous catheter with tip projecting over the upper peripheral SVC region allowing for patient rotation. No definitive pneumothorax. 2. Cardiomegaly with vascular congestion. Patchy bilateral airspace opacities may be due to edema or pneumonia. Electronically Signed   By: Luke Bun M.D.   On: 12/25/2023 16:44   DG CHEST PORT 1 VIEW Result Date: 12/25/2023 CLINICAL DATA:  ARDS. EXAM: PORTABLE CHEST 1 VIEW COMPARISON:  Chest radiograph dated 12/24/2023. FINDINGS: Shallow inspiration. There is increased interstitial markings, likely atelectasis. No focal consolidation, pleural effusion or pneumothorax. Stable cardiac silhouette. No acute osseous pathology. IMPRESSION: No acute cardiopulmonary process. Electronically Signed   By: Vanetta Chou M.D.   On: 12/25/2023 15:13   US  Abdomen Limited RUQ (LIVER/GB) Result Date: 12/25/2023 CLINICAL DATA:  6216 Acute pancreatitis 6216 EXAM: ULTRASOUND ABDOMEN LIMITED RIGHT UPPER QUADRANT COMPARISON:  12/25/2023 CT of the abdomen and pelvis FINDINGS: Gallbladder: No gallstones. Small amount of pericholecystic fluid. Positive sonographic Murphy's sign noted by sonographer. Common bile duct: Diameter: 4 mm Liver: Increased echogenicity. No focal lesion identified. No intrahepatic biliary ductal dilation. Portal vein is patent on color Doppler imaging with normal direction of blood flow towards the liver. Other: None. IMPRESSION: 1. Pericholecystic inflammation with a positive sonographic Murphy's sign, likely related to the upper abdominal inflammation from acute pancreatitis. No cholecystolithiasis or gallbladder wall thickening. 2. No intrahepatic or extrahepatic biliary ductal dilation. 3. Hepatic steatosis. Electronically Signed   By: Rogelia Myers M.D.   On: 12/25/2023 12:20

## 2023-12-27 NOTE — Progress Notes (Signed)
 1424 Report from 46M RN, Thersia, RN:  PMH reviewed  9/28 [facility] ED)- c/c N/V/Abd Pain - DKA, AKI, Acute Pancreatitis, Insulin  Drip, HD Cath placed and Removed, d/c insulin  drip this morning (1250), Long acting Insulin  given, Q4H BG and sliding scale, Peripheral Ivs x 2, K+ running with fluids at this time, a/o x4, Urinal + BSC, Up to recliner today (standby assist), PT/OT, RA, Heart Healthy Carb modified, poor appetite, Protonix  ordered for recent and chronic acid reflux, LBM: 10/1, stress incontinence (coughing), greenish stool, ST (low 100), BP: HTN (beta blocker started today), No skin issues, Bath Given 10/1, visitors bedside, Droplet Precautions (Rhinovirus), +cough (dry) with rib pain, PRN pain medications established 1450: Patient arrives to unit, bed 12 1458: Patient placed on telemetry by [place], NT 1909: PIV removed and dressed on L arm. Per previous report, K+ running through line but patient endorsed pain to site. Site has ecchymosis and lower left arm is taunt. Area under previous catheter site has hardened string like edema. Will contact pharmacy or relay information to oncoming RN 2000: Report given to oncoming RN

## 2023-12-27 NOTE — Plan of Care (Signed)
   Problem: Education: Goal: Knowledge of General Education information will improve Description: Including pain rating scale, medication(s)/side effects and non-pharmacologic comfort measures Outcome: Completed/Met

## 2023-12-27 NOTE — Inpatient Diabetes Management (Incomplete)
 Inpatient Diabetes Program Recommendations  AACE/ADA: New Consensus Statement on Inpatient Glycemic Control   Target Ranges:  Prepandial:   less than 140 mg/dL      Peak postprandial:   less than 180 mg/dL (1-2 hours)      Critically ill patients:  140 - 180 mg/dL    Latest Reference Range & Units 12/27/23 08:29  CO2 22 - 32 mmol/L 20 (L)  Glucose 70 - 99 mg/dL 874 (H)  BUN 8 - 23 mg/dL 14  Creatinine 9.38 - 8.75 mg/dL 8.77  Calcium  8.9 - 10.3 mg/dL 7.6 (L)  Anion gap 5 - 15  12    Latest Reference Range & Units 12/26/23 20:35 12/26/23 21:29 12/26/23 22:36 12/26/23 23:34 12/27/23 00:39 12/27/23 01:50 12/27/23 04:06 12/27/23 06:23 12/27/23 07:43  Glucose-Capillary 70 - 99 mg/dL 898 (H) 882 (H) 881 (H) 130 (H) 143 (H) 139 (H) 117 (H) 130 (H) 134 (H)    Review of Glycemic Control  Diabetes history: DM Outpatient Diabetes medications:  Dexcom G7 sensor Glucotrol  XL 5 mg daily Humalog  30-36 units tid with meals Toujeo  130 units q HS Current orders for Inpatient glycemic control:  IV insulin     Inpatient Diabetes Program Recommendations:     Noted plan for IV insulin  for increased triglycerides. In agreement.

## 2023-12-28 ENCOUNTER — Ambulatory Visit: Admitting: "Endocrinology

## 2023-12-28 ENCOUNTER — Telehealth: Payer: Self-pay | Admitting: Family Medicine

## 2023-12-28 ENCOUNTER — Telehealth: Payer: Self-pay

## 2023-12-28 ENCOUNTER — Other Ambulatory Visit: Payer: Self-pay | Admitting: Family Medicine

## 2023-12-28 ENCOUNTER — Ambulatory Visit: Payer: Self-pay

## 2023-12-28 DIAGNOSIS — E1110 Type 2 diabetes mellitus with ketoacidosis without coma: Secondary | ICD-10-CM | POA: Diagnosis not present

## 2023-12-28 DIAGNOSIS — Z79891 Long term (current) use of opiate analgesic: Secondary | ICD-10-CM

## 2023-12-28 DIAGNOSIS — E781 Pure hyperglyceridemia: Secondary | ICD-10-CM

## 2023-12-28 DIAGNOSIS — K858 Other acute pancreatitis without necrosis or infection: Secondary | ICD-10-CM

## 2023-12-28 LAB — LEGIONELLA PNEUMOPHILA SEROGP 1 UR AG: L. pneumophila Serogp 1 Ur Ag: NEGATIVE

## 2023-12-28 LAB — BASIC METABOLIC PANEL WITH GFR
Anion gap: 16 — ABNORMAL HIGH (ref 5–15)
BUN: 18 mg/dL (ref 8–23)
CO2: 21 mmol/L — ABNORMAL LOW (ref 22–32)
Calcium: 9.2 mg/dL (ref 8.9–10.3)
Chloride: 95 mmol/L — ABNORMAL LOW (ref 98–111)
Creatinine, Ser: 1.44 mg/dL — ABNORMAL HIGH (ref 0.61–1.24)
GFR, Estimated: 55 mL/min — ABNORMAL LOW (ref >60)
Glucose, Bld: 205 mg/dL — ABNORMAL HIGH (ref 70–99)
Potassium: 4.7 mmol/L (ref 3.5–5.1)
Sodium: 132 mmol/L — ABNORMAL LOW (ref 135–145)

## 2023-12-28 LAB — GLUCOSE, CAPILLARY
Glucose-Capillary: 189 mg/dL — ABNORMAL HIGH (ref 70–99)
Glucose-Capillary: 175 mg/dL — ABNORMAL HIGH (ref 70–99)
Glucose-Capillary: 174 mg/dL — ABNORMAL HIGH (ref 70–99)

## 2023-12-28 MED ORDER — AMOXICILLIN-POT CLAVULANATE 875-125 MG PO TABS: 1.0000 | ORAL_TABLET | Freq: Two times a day (BID) | ORAL | 0 refills | Status: AC

## 2023-12-28 NOTE — TOC Transition Note (Signed)
 Transition of Care [facility]) - Discharge Note   Patient Details  Name: Terry [name] MRN: [id number] Date of Birth: 1960/10/23  Transition of Care [facility]) CM/SW Contact:  Terry [name], Terry Knoll Daphne, RN Phone Number: 12/28/2023, 3:00 PM   Clinical Narrative:     Patient is scheduled for discharge today.  Readmission Risk Assessment done. Outpatient f/u, hospital f/u and discharge instructions on AVS. No ICM needs or recommendations noted. Wife, Terry [name] at bedside and will transport at discharge.  No further ICM needs noted.       Final next level of care: Home/Self Care Barriers to Discharge: Barriers Resolved   Patient Goals and CMS Choice Patient states their goals for this hospitalization and ongoing recovery are:: To return home CMS Medicare.gov Compare Post Acute Care list provided to:: Patient Choice offered to / list presented to : NA      Discharge Placement                Patient to be transferred to facility by: Wife Name of family member notified: Terry [name]    Discharge Plan and Services Additional resources added to the After Visit Summary for                  DME Arranged: N/A DME Agency: NA       HH Arranged: NA HH Agency: NA        Social Drivers of Health (SDOH) Interventions SDOH Screenings   Food Insecurity: Food Insecurity Present (12/24/2023)  Housing: Low Risk  (12/24/2023)  Transportation Needs: No Transportation Needs (12/24/2023)  Utilities: Not At Risk (12/24/2023)  Depression (PHQ2-9): High Risk (10/06/2023)  Financial Resource Strain: Medium Risk (03/17/2023)  Physical Activity: Sufficiently Active (03/17/2023)  Social Connections: Moderately Integrated (03/17/2023)  Stress: No Stress Concern Present (03/17/2023)  Tobacco Use: Medium Risk (12/27/2023)     Readmission Risk Interventions    12/28/2023    2:59 PM  Readmission Risk Prevention Plan  Post Dischage Appt Complete  Medication Screening Complete  Transportation  Screening Complete

## 2023-12-28 NOTE — Telephone Encounter (Unsigned)
 Copied from CRM [id number]. Topic: Clinical - Medication Refill >> Dec 28, 2023  4:30 PM Shanda MATSU wrote: Medication: oxyCODONE -acetaminophen  (PERCOCET) 10-325 MG tablet   Has the patient contacted their pharmacy? No (Agent: If no, request that the patient contact the pharmacy for the refill. If patient does not wish to contact the pharmacy document the reason why and proceed with request.) (Agent: If yes, when and what did the pharmacy advise?)  This is the patient's preferred pharmacy:  [facility] - [place], [place] - [address] [place] [address] Phone: [phone] Fax: [phone]  Is this the correct pharmacy for this prescription? Yes If no, delete pharmacy and type the correct one.   Has the prescription been filled recently? No  Is the patient out of the medication? Yes  Has the patient been seen for an appointment in the last year OR does the patient have an upcoming appointment? Yes  Can we respond through MyChart? Yes  Agent: Please be advised that Rx refills may take up to 3 business days. We ask that you follow-up with your pharmacy.

## 2023-12-28 NOTE — Progress Notes (Signed)
 DISCHARGE NOTE HOME Terry [name] to be discharged Home per MD order. Discussed prescriptions and follow up appointments with the patient. Prescriptions given to patient; medication list explained in detail. Patient verbalized understanding.  Skin clean, dry and intact without evidence of skin break down, no evidence of skin tears noted. IV catheter discontinued intact. Site without signs and symptoms of complications. Dressing and pressure applied. Pt denies pain at the site currently. No complaints noted.  Patient free of lines, drains, and wounds.   An After Visit Summary (AVS) was printed and given to the patient. Patient escorted via wheelchair, and discharged home via private auto.  [name] A Proctor-Gann, RN

## 2023-12-28 NOTE — Telephone Encounter (Signed)
 FYI Only or Action Required?: Action required by provider: medication refill request.  Patient was last seen in primary care on 10/06/2023 by Alphonsa Glendia LABOR, MD.  Called Nurse Triage reporting Back Pain.  Symptoms began several days ago.  Interventions attempted: Other: received pain medication while in the hospital.  Symptoms are: unchanged.  Triage Disposition: Home Care  Patient/caregiver understands and will follow disposition?: Yes     Copied from CRM [id number]. Topic: Clinical - Red Word Triage >> Dec 28, 2023  4:32 PM Shanda MATSU wrote: Red Word that prompted transfer to Nurse Triage: Patient's wife, Ericson Nafziger, calling ion behalf on patient, patient was recently discharged from the hospital but is having severe pain from from front of his body to his back, caller is not sure of the pain is from his pancreatitis or from the bed at the hospital.      Reason for Disposition  Back pain    Patient just released from the hospital today, back pain present for the last 3 days while admitted  Answer Assessment - Initial Assessment Questions Patient discharged from the hospital today. His wife states he is having moderate to severe back pain which they believe is due to the hospital bed. She would like to know if his Percocet could be refilled to assist with the pain. Patient has an appointment scheduled for 10/15. Please advise.   Refill request was submitted separately.       1. ONSET: When did the pain begin? (e.g., minutes, hours, days)     3 days  2. LOCATION: Where does it hurt? (upper, mid or lower back)     Diffuse back pain  3. SEVERITY: How bad is the pain?  (e.g., Scale 1-10; mild, moderate, or severe)     Moderate to severe  4. PATTERN: Is the pain constant? (e.g., yes, no; constant, intermittent)      Constant  6. CAUSE:  What do you think is causing the back pain?      Recently hospitalized and believes it's due to the bed  7. BACK OVERUSE:  Any  recent lifting of heavy objects, strenuous work or exercise?     No 8. MEDICINES: What have you taken so far for the pain? (e.g., nothing, acetaminophen , NSAIDS)     Was given pain medication while in the hospital 9. NEUROLOGIC SYMPTOMS: Do you have any weakness, numbness, or problems with bowel/bladder control?     No 10. OTHER SYMPTOMS: Do you have any other symptoms? (e.g., fever, abdomen pain, burning with urination, blood in urine)       Abdominal pain (recently hospitalized with pancreatitis)  Protocols used: Back Pain-A-AH

## 2023-12-28 NOTE — Discharge Summary (Incomplete)
 Physician Discharge Summary   Patient: Terry [name] MRN: [id number] DOB: [birth year]  Admit date:     12/24/2023  Discharge date: {dischdate:26783}  Discharge Physician: Concepcion Riser   PCP: Alphonsa Glendia LABOR, MD   Recommendations at discharge:  {Tip this will not be part of the note when signed- Example include specific recommendations for outpatient follow-up, pending tests to follow-up on. (Optional):26781}  ***  Discharge Diagnoses: Principal Problem:   Hypertriglyceridemia Active Problems:   DKA (diabetic ketoacidosis) (HCC)  Resolved Problems:   * No resolved hospital problems. [facility] Course: No notes on file  Assessment and Plan: No notes have been filed under this hospital service. Service: Hospitalist     {Tip this will not be part of the note when signed Body mass index is 26.97 kg/m. ,  Nutrition Documentation    Flowsheet Row ED to Hosp-Admission (Current) from 12/24/2023 in [facility]  Nutrition Problem Inadequate oral intake  Etiology poor appetite  Nutrition Goal Patient will meet greater than or equal to 90% of their needs  Interventions MVI  ,  (Optional):26781}  {(NOTE) Pain control PDMP Statment (Optional):26782} Consultants: *** Procedures performed: ***  Disposition: {Plan; Disposition:26390} Diet recommendation:  Discharge Diet Orders (From admission, onward)     Start     Ordered   12/28/23 0000  Diet - low sodium heart healthy        12/28/23 1435   12/28/23 0000  Diet Carb Modified        12/28/23 1435           {Diet_Plan:26776} DISCHARGE MEDICATION: Allergies as of 12/28/2023       Reactions   Invokana  [canagliflozin ] Other (See Comments)   laryngitis   Trulicity  [dulaglutide ] Swelling   Patient also did not tolerate because of blistering in the mouth would avoid all GLP-1's patient was hospitalized with diabetic ketoacidosis August 2023   Altace [ramipril] Cough   Metformin  And  Related Diarrhea        Medication List     TAKE these medications    amLODipine  10 MG tablet Commonly known as: NORVASC  Take 1 tablet (10 mg total) by mouth daily.   amoxicillin -clavulanate 875-125 MG tablet Commonly known as: AUGMENTIN  Take 1 tablet by mouth 2 (two) times daily for 3 days.   aspirin  EC 81 MG tablet Take 1 tablet (81 mg total) by mouth daily with breakfast.   Dexcom G7 Sensor Misc Inject 1 Application into the skin as directed. Change sensor every 10 days as directed.   ezetimibe  10 MG tablet Commonly known as: ZETIA  Take 1 tablet (10 mg total) by mouth daily.   fenofibrate  160 MG tablet Take 1 tablet (160 mg total) by mouth daily.   glipiZIDE  5 MG 24 hr tablet Commonly known as: GLUCOTROL  XL Take 1 tablet (5 mg total) by mouth daily with breakfast.   HumaLOG  KwikPen 100 UNIT/ML KwikPen Generic drug: insulin  lispro Inject 0-41 Units into the skin 3 (three) times daily.   indapamide  2.5 MG tablet Commonly known as: LOZOL  TAKE (1) TABLET BY MOUTH ONCE DAILY.   metoprolol  succinate 100 MG 24 hr tablet Commonly known as: TOPROL -XL TAKE (1) TABLET BY MOUTH ONCE DAILY.   multivitamin tablet Take 1 tablet by mouth daily.   oxyCODONE -acetaminophen  10-325 MG tablet Commonly known as: PERCOCET 1  taken 4 times daily as needed for pain What changed:  how much to take how to take this when to take this reasons  to take this   rosuvastatin  40 MG tablet Commonly known as: CRESTOR  Take 1 tablet (40 mg total) by mouth daily.   Toujeo  Max SoloStar 300 UNIT/ML Solostar Pen Generic drug: insulin  glargine (2 Unit Dial) Inject 130 Units into the skin at bedtime.   triamcinolone  cream 0.1 % Commonly known as: KENALOG  APPLY TO AFFECTED AREA SPARINGLY THREE TIMES DAILY. What changed:  how much to take how to take this when to take this reasons to take this additional instructions   valsartan  320 MG tablet Commonly known as: DIOVAN  Take 1 tablet  (320 mg total) by mouth daily.        Discharge Exam: Filed Weights   12/24/23 9356 12/27/23 1510  Weight: 99.8 kg 87.7 kg   ***  Condition at discharge: {DC Condition:26389}  The results of significant diagnostics from this hospitalization (including imaging, microbiology, ancillary and laboratory) are listed below for reference.   Imaging Studies: DG CHEST PORT 1 VIEW Result Date: 12/25/2023 CLINICAL DATA:  Central line EXAM: PORTABLE CHEST 1 VIEW COMPARISON:  12/25/2023, 12/24/2023 FINDINGS: Hypoventilatory changes. Enlarged mediastinal silhouette likely augmented by low lung volume. Left IJ central venous catheter with tip projecting over the upper peripheral SVC region allowing for patient rotation. Cardiomegaly with vascular congestion. Patchy bilateral airspace opacities. No pleural effusion or definitive pneumothorax. IMPRESSION: 1. Left IJ central venous catheter with tip projecting over the upper peripheral SVC region allowing for patient rotation. No definitive pneumothorax. 2. Cardiomegaly with vascular congestion. Patchy bilateral airspace opacities may be due to edema or pneumonia. Electronically Signed   By: Luke Bun M.D.   On: 12/25/2023 16:44   DG CHEST PORT 1 VIEW Result Date: 12/25/2023 CLINICAL DATA:  ARDS. EXAM: PORTABLE CHEST 1 VIEW COMPARISON:  Chest radiograph dated 12/24/2023. FINDINGS: Shallow inspiration. There is increased interstitial markings, likely atelectasis. No focal consolidation, pleural effusion or pneumothorax. Stable cardiac silhouette. No acute osseous pathology. IMPRESSION: No acute cardiopulmonary process. Electronically Signed   By: Vanetta Chou M.D.   On: 12/25/2023 15:13   US  Abdomen Limited RUQ (LIVER/GB) Result Date: 12/25/2023 CLINICAL DATA:  6216 Acute pancreatitis 6216 EXAM: ULTRASOUND ABDOMEN LIMITED RIGHT UPPER QUADRANT COMPARISON:  12/25/2023 CT of the abdomen and pelvis FINDINGS: Gallbladder: No gallstones. Small amount of  pericholecystic fluid. Positive sonographic Murphy's sign noted by sonographer. Common bile duct: Diameter: 4 mm Liver: Increased echogenicity. No focal lesion identified. No intrahepatic biliary ductal dilation. Portal vein is patent on color Doppler imaging with normal direction of blood flow towards the liver. Other: None. IMPRESSION: 1. Pericholecystic inflammation with a positive sonographic Murphy's sign, likely related to the upper abdominal inflammation from acute pancreatitis. No cholecystolithiasis or gallbladder wall thickening. 2. No intrahepatic or extrahepatic biliary ductal dilation. 3. Hepatic steatosis. Electronically Signed   By: Rogelia Myers M.D.   On: 12/25/2023 12:20   CT ABDOMEN PELVIS WO CONTRAST Result Date: 12/25/2023 CLINICAL DATA:  Abdominal pain EXAM: CT ABDOMEN AND PELVIS WITHOUT CONTRAST TECHNIQUE: Multidetector CT imaging of the abdomen and pelvis was performed following the standard protocol without IV contrast. RADIATION DOSE REDUCTION: This exam was performed according to the departmental dose-optimization program which includes automated exposure control, adjustment of the mA and/or kV according to patient size and/or use of iterative reconstruction technique. COMPARISON:  None Available. FINDINGS: Lower chest: Extensive airspace disease in both lower lobes, mostly peripheral. No effusions. Coronary artery and aortic atherosclerosis. Hepatobiliary: Insert fatty CT Pancreas: Extensive stranding noted surrounding the pancreas and extending in the anterior pararenal  spaces bilaterally compatible with acute pancreatitis. No ductal dilatation. Spleen: No focal abnormality.  Normal size. Adrenals/Urinary Tract: No adrenal abnormality. No focal renal abnormality. No stones or hydronephrosis. Urinary bladder is unremarkable. Stomach/Bowel: The duodenum is likely secondarily inflamed. Stomach is decompressed. Remainder of the small bowel and large bowel decompressed and unremarkable.  No bowel obstruction. Vascular/Lymphatic: Aortic atherosclerosis. No evidence of aneurysm or adenopathy. Reproductive: No visible focal abnormality. Other: No free fluid or free air. Musculoskeletal: No acute bony abnormality. IMPRESSION: Extensive peripheral airspace disease in the lower lobes. Cannot exclude pneumonia. Extensive changes of acute pancreatitis as described above. Coronary artery disease, aortic atherosclerosis. Hepatic steatosis Electronically Signed   By: Franky Crease M.D.   On: 12/25/2023 01:02   DG Abd 1 View Result Date: 12/24/2023 CLINICAL DATA:  Abdominal distension, nausea, vomiting EXAM: ABDOMEN - 1 VIEW COMPARISON:  None Available. FINDINGS: The bowel gas pattern is normal. No radio-opaque calculi or other significant radiographic abnormality are seen. IMPRESSION: Negative. Electronically Signed   By: Franky Crease M.D.   On: 12/24/2023 21:25   DG Chest Portable 1 View Result Date: 12/24/2023 CLINICAL DATA:  63 year old male with abnormal pulmonary auscultation. Shortness of breath. EXAM: PORTABLE CHEST 1 VIEW COMPARISON:  Portable chest 0727 hours today and earlier. FINDINGS: Portable AP view at 0953 hours. Unchanged lung volumes, mediastinal contours which remain within normal limits. Visualized tracheal air column is within normal limits. Chronic symmetric pulmonary interstitial markings appear stable from 2023. No pneumothorax, pleural effusion or confluent lung opacity. Paucity of bowel gas. Stable visualized osseous structures. IMPRESSION: Stable, No acute cardiopulmonary abnormality. Electronically Signed   By: VEAR Hurst M.D.   On: 12/24/2023 10:36   DG Chest Portable 1 View Result Date: 12/24/2023 EXAM: 1 VIEW(S) XRAY OF THE CHEST 12/24/2023 07:44:24 AM COMPARISON: 11/23/21 CLINICAL HISTORY: SOB. Per Triage: Pt arrives via POV from home with c/o N/V starting yesterday and SHOB this evening. Hx type 2 diabetic. Pt tachypneic, thirsty. States last insulin  dose was at lunch time.  BGL reading high. FINDINGS: LUNGS AND PLEURA: Low lung volumes. No focal pulmonary opacity. No pulmonary edema. No pleural effusion. No pneumothorax. HEART AND MEDIASTINUM: No acute abnormality of the cardiac and mediastinal silhouettes. BONES AND SOFT TISSUES: No acute osseous abnormality. IMPRESSION: 1. No acute cardiopulmonary process. 2. Low lung volumes. Electronically signed by: Waddell Calk MD 12/24/2023 09:09 AM EDT RP Workstation: [id number]    Microbiology: Results for orders placed or performed during the hospital encounter of 12/24/23  Resp panel by RT-PCR (RSV, Flu A&B, Covid) Anterior Nasal Swab     Status: None   Collection Time: 12/24/23  7:56 AM   Specimen: Anterior Nasal Swab  Result Value Ref Range Status   SARS Coronavirus 2 by RT PCR NEGATIVE NEGATIVE Final    Comment: (NOTE) SARS-CoV-2 target nucleic acids are NOT DETECTED.  The SARS-CoV-2 RNA is generally detectable in upper respiratory specimens during the acute phase of infection. The lowest concentration of SARS-CoV-2 viral copies this assay can detect is 138 copies/mL. A negative result does not preclude SARS-Cov-2 infection and should not be used as the sole basis for treatment or other patient management decisions. A negative result may occur with  improper specimen collection/handling, submission of specimen other than nasopharyngeal swab, presence of viral mutation(s) within the areas targeted by this assay, and inadequate number of viral copies(<138 copies/mL). A negative result must be combined with clinical observations, patient history, and epidemiological information. The expected result is Negative.  Fact  Sheet for Patients:  BloggerCourse.com  Fact Sheet for Healthcare Providers:  SeriousBroker.it  This test is no t yet approved or cleared by the [place]  FDA and  has been authorized for detection and/or diagnosis of SARS-CoV-2 by FDA under  an Emergency Use Authorization (EUA). This EUA will remain  in effect (meaning this test can be used) for the duration of the COVID-19 declaration under Section 564(b)(1) of the Act, 21 U.S.C.section 360bbb-3(b)(1), unless the authorization is terminated  or revoked sooner.       Influenza A by PCR NEGATIVE NEGATIVE Final   Influenza B by PCR NEGATIVE NEGATIVE Final    Comment: (NOTE) The Xpert Xpress SARS-CoV-2/FLU/RSV plus assay is intended as an aid in the diagnosis of influenza from Nasopharyngeal swab specimens and should not be used as a sole basis for treatment. Nasal washings and aspirates are unacceptable for Xpert Xpress SARS-CoV-2/FLU/RSV testing.  Fact Sheet for Patients: BloggerCourse.com  Fact Sheet for Healthcare Providers: SeriousBroker.it  This test is not yet approved or cleared by the [place]  FDA and has been authorized for detection and/or diagnosis of SARS-CoV-2 by FDA under an Emergency Use Authorization (EUA). This EUA will remain in effect (meaning this test can be used) for the duration of the COVID-19 declaration under Section 564(b)(1) of the Act, 21 U.S.C. section 360bbb-3(b)(1), unless the authorization is terminated or revoked.     Resp Syncytial Virus by PCR NEGATIVE NEGATIVE Final    Comment: (NOTE) Fact Sheet for Patients: BloggerCourse.com  Fact Sheet for Healthcare Providers: SeriousBroker.it  This test is not yet approved or cleared by the [place]  FDA and has been authorized for detection and/or diagnosis of SARS-CoV-2 by FDA under an Emergency Use Authorization (EUA). This EUA will remain in effect (meaning this test can be used) for the duration of the COVID-19 declaration under Section 564(b)(1) of the Act, 21 U.S.C. section 360bbb-3(b)(1), unless the authorization is terminated or revoked.  Performed at [facility], [address], [place], [place] [address]   Blood Culture (routine x 2)     Status: None (Preliminary result)   Collection Time: 12/24/23  8:40 AM   Specimen: BLOOD LEFT HAND  Result Value Ref Range Status   Specimen Description   Final    BLOOD LEFT HAND BOTTLES DRAWN AEROBIC AND ANAEROBIC   Special Requests   Final    Blood Culture results may not be optimal due to an inadequate volume of blood received in culture bottles   Culture   Final    NO GROWTH 4 DAYS Performed at [facility], [address] [place], [place] [address]    Report Status PENDING  Incomplete  Blood Culture (routine x 2)     Status: None (Preliminary result)   Collection Time: 12/24/23  8:40 AM   Specimen: BLOOD RIGHT ARM  Result Value Ref Range Status   Specimen Description   Final    BLOOD RIGHT ARM BOTTLES DRAWN AEROBIC AND ANAEROBIC   Special Requests   Final    Blood Culture results may not be optimal due to an inadequate volume of blood received in culture bottles   Culture   Final    NO GROWTH 4 DAYS Performed at [facility], [address] [place], [place] [address]    Report Status PENDING  Incomplete  MRSA Next Gen by PCR, Nasal     Status: None   Collection Time: 12/24/23 12:03 PM   Specimen: Nasal Mucosa; Nasal Swab  Result  Value Ref Range Status   MRSA by PCR Next Gen NOT DETECTED NOT DETECTED Final    Comment: (NOTE) The GeneXpert MRSA Assay (FDA approved for NASAL specimens only), is one component of a comprehensive MRSA colonization surveillance program. It is not intended to diagnose MRSA infection nor to guide or monitor treatment for MRSA infections. Test performance is not FDA approved in patients less than 53 years old. Performed at [facility], [address], [place], [place] [address]   Respiratory (~20 pathogens) panel by PCR     Status: Abnormal   Collection Time: 12/25/23  9:55 PM   Specimen: Nasopharyngeal Swab; Respiratory  Result Value Ref Range Status    Adenovirus NOT DETECTED NOT DETECTED Final   Coronavirus 229E NOT DETECTED NOT DETECTED Final    Comment: (NOTE) The Coronavirus on the Respiratory Panel, DOES NOT test for the novel  Coronavirus (2019 nCoV)    Coronavirus HKU1 NOT DETECTED NOT DETECTED Final   Coronavirus NL63 NOT DETECTED NOT DETECTED Final   Coronavirus OC43 NOT DETECTED NOT DETECTED Final   Metapneumovirus NOT DETECTED NOT DETECTED Final   Rhinovirus / Enterovirus DETECTED (A) NOT DETECTED Final   Influenza A NOT DETECTED NOT DETECTED Final   Influenza B NOT DETECTED NOT DETECTED Final   Parainfluenza Virus 1 NOT DETECTED NOT DETECTED Final   Parainfluenza Virus 2 NOT DETECTED NOT DETECTED Final   Parainfluenza Virus 3 NOT DETECTED NOT DETECTED Final   Parainfluenza Virus 4 NOT DETECTED NOT DETECTED Final   Respiratory Syncytial Virus NOT DETECTED NOT DETECTED Final   Bordetella pertussis NOT DETECTED NOT DETECTED Final   Bordetella Parapertussis NOT DETECTED NOT DETECTED Final   Chlamydophila pneumoniae NOT DETECTED NOT DETECTED Final   Mycoplasma pneumoniae NOT DETECTED NOT DETECTED Final    Comment: Performed at [facility], [address] [place], [place] [address]  Expectorated Sputum Assessment w Gram Stain, Rflx to Resp Cult     Status: None   Collection Time: 12/26/23  8:34 AM   Specimen: Expectorated Sputum  Result Value Ref Range Status   Specimen Description EXPECTORATED SPUTUM  Final   Special Requests NONE  Final   Sputum evaluation   Final    Sputum specimen not acceptable for testing.  Please recollect.   NOTIFIED RN [facility] LAMBERT ON 12/26/23@ 1510 BY DRT Performed at [facility], [address] [place], [place] [address]    Report Status 12/26/2023 FINAL  Final    Labs: CBC: Recent Labs  Lab 12/24/23 0625 12/25/23 0227 12/25/23 1131 12/26/23 0658 12/27/23 0829  WBC 27.8* 14.5*  --  8.0 12.7*  NEUTROABS 20.3*  --   --  6.6  --   HGB 13.6 14.9 11.9* 11.9* 11.4*  HCT  40.0 40.3 35.0* 33.0* 31.7*  MCV 92.2 82.4  --  84.6 84.5  PLT 247 187  --  120* 146*   Basic Metabolic Panel: Recent Labs  Lab 12/24/23 1846 12/24/23 2047 12/25/23 0227 12/25/23 1131 12/26/23 1758 12/27/23 0024 12/27/23 0829 12/27/23 1432 12/28/23 0216  NA 131*   < > 131*   < > 131* 129* 129* 129* 132*  K 3.6   < > 3.7   < > 3.3* 3.3* 3.5 4.3 4.7  CL 94*   < > 98   < > 100 98 97* 98 95*  CO2 16*   < > 20*   < > 21* 21* 20* 20* 21*  GLUCOSE 319*   < >  269*   < > 106* 130* 125* 190* 205*  BUN 32*   < > 28*   < > 19 16 14 15 18   CREATININE 2.13*   < > 1.97*   < > 1.48* 1.34* 1.22 1.25* 1.44*  CALCIUM  8.8*   < > 7.8*   < > 7.2* 7.3* 7.6* 7.6* 9.2  MG 2.0  --  1.6*  --   --   --   --  2.1  --   PHOS <1.0*  --  1.8*  --   --   --   --   --   --    < > = values in this interval not displayed.   Liver Function Tests: Recent Labs  Lab 12/25/23 0227 12/26/23 0658  AST 135* 54*  ALT 34 22  ALKPHOS 32* 26*  BILITOT 1.1 1.1  PROT 6.5 5.4*  ALBUMIN 3.1* 2.4*   CBG: Recent Labs  Lab 12/27/23 1944 12/27/23 2308 12/28/23 0353 12/28/23 0742 12/28/23 1137  GLUCAP 274* 240* 189* 175* 174*    Discharge time spent: {LESS THAN/GREATER THAN:26388} 30 minutes.  Signed: Concepcion Riser, MD [facility] 12/28/2023

## 2023-12-28 NOTE — Telephone Encounter (Signed)
 Patient was recently admitted into the hospital with DKA he was in the ICU for several days he just got out on 12/28/2023 Please set him up to do a follow-up visit with me next week thank you-Dr. Glendia

## 2023-12-28 NOTE — Progress Notes (Signed)
 Care Guide Pharmacy Note  12/28/2023 Name: Omar Gayden MRN: [id number] DOB: [birth year]  Referred By: Alphonsa Glendia LABOR, MD Reason for referral: Complex Care Management (Outreach to schedule with Pharm d )   Eutimio Gharibian is a 63 y.o. year old male who is a primary care patient of Luking, Glendia LABOR, MD.  [name] was referred to the pharmacist for assistance related to: DMII  Successful contact was made with the patient to discuss pharmacy services including being ready for the pharmacist to call at least 5 minutes before the scheduled appointment time and to have medication bottles and any blood pressure readings ready for review. The patient agreed to meet with the pharmacist via telephone visit on (date/time).01/01/2024  Jeoffrey Buffalo , RMA     [place]  [facility], [facility] Guide  Direct Dial: [phone]  Website: [email or web address]

## 2023-12-28 NOTE — Plan of Care (Signed)
  Problem: Health Behavior/Discharge Planning: Goal: Ability to manage health-related needs will improve Outcome: Adequate for Discharge   Problem: Clinical Measurements: Goal: Ability to maintain clinical measurements within normal limits will improve Outcome: Adequate for Discharge Goal: Will remain free from infection Outcome: Adequate for Discharge Goal: Diagnostic test results will improve Outcome: Adequate for Discharge Goal: Respiratory complications will improve Outcome: Adequate for Discharge Goal: Cardiovascular complication will be avoided Outcome: Adequate for Discharge   Problem: Nutrition: Goal: Adequate nutrition will be maintained Outcome: Adequate for Discharge   Problem: Coping: Goal: Level of anxiety will decrease Outcome: Adequate for Discharge   Problem: Elimination: Goal: Will not experience complications related to bowel motility Outcome: Adequate for Discharge Goal: Will not experience complications related to urinary retention Outcome: Adequate for Discharge

## 2023-12-29 ENCOUNTER — Other Ambulatory Visit: Payer: Self-pay | Admitting: Family Medicine

## 2023-12-29 ENCOUNTER — Telehealth: Payer: Self-pay | Admitting: *Deleted

## 2023-12-29 DIAGNOSIS — Z79891 Long term (current) use of opiate analgesic: Secondary | ICD-10-CM

## 2023-12-29 LAB — CULTURE, BLOOD (ROUTINE X 2)
Culture: NO GROWTH
Culture: NO GROWTH

## 2023-12-29 MED ORDER — OXYCODONE-ACETAMINOPHEN 10-325 MG PO TABS: ORAL_TABLET | ORAL | 0 refills | Status: DC

## 2023-12-29 NOTE — Telephone Encounter (Signed)
 Left a message for the patient regarding drs notes about abd pain, also messaged via my chart.

## 2023-12-29 NOTE — Telephone Encounter (Signed)
 Nurses Please call and talk with patient/family-please document that they understand the following If he is having severe abdominal pain or having severe abdominal pain with nausea and vomiting he needs to go to the ER to get rechecked he had severe pancreatitis and was in the ICU-it is quite possible if he is having severe abdominal pain that this could be severe pancreatitis again  If it is more musculoskeletal pain I did send in his refill of his Percocet Also-he needs a follow-up office visit with me next week same-day slot as a hospital follow-up This can be in place of the October 15 appointment

## 2023-12-29 NOTE — Transitions of Care (Post Inpatient/ED Visit) (Signed)
   12/29/2023  Name: Terry [name] MRN: [id number] DOB: [birth year]  Today's TOC FU Call Status: Today's TOC FU Call Status:: Unsuccessful Call (1st Attempt) Unsuccessful Call (1st Attempt) Date: 12/29/23  Attempted to reach the patient regarding the most recent Inpatient/ED visit.  Follow Up Plan: Additional outreach attempts will be made to reach the patient to complete the Transitions of Care (Post Inpatient/ED visit) call.   Mliss Creed [facility], BSN RN Care Manager/ Transition of Care [place]/ [facility] [phone]

## 2024-01-01 ENCOUNTER — Other Ambulatory Visit

## 2024-01-01 ENCOUNTER — Telehealth: Payer: Self-pay

## 2024-01-01 NOTE — Progress Notes (Unsigned)
 Complex Care Management Care Guide Note  01/01/2024 Name: Terry [name] MRN: [id number] DOB: February 28, 1961  Terry [name] is a 63 y.o. year old male who is a primary care patient of Luking, Glendia LABOR, MD and is actively engaged with the care management team. I reached out to [name] by phone today to assist with re-scheduling  with the Pharmacist.  Follow up plan: Unsuccessful telephone outreach attempt made. A HIPAA compliant phone message was left for the patient providing contact information and requesting a return call.  Jeoffrey Buffalo , RMA     [facility]  [facility], [facility] Guide  Direct Dial: [phone]  Website: [email or web address]

## 2024-01-01 NOTE — Transitions of Care (Post Inpatient/ED Visit) (Signed)
   01/01/2024  Name: Terry [name] MRN: [id number] DOB: 14-Nov-1960  Today's TOC FU Call Status: Today's TOC FU Call Status:: Unsuccessful Call (2nd Attempt) Unsuccessful Call (2nd Attempt) Date: 01/01/24  Attempted to reach the patient regarding the most recent Inpatient/ED visit.  Follow Up Plan: Additional outreach attempts will be made to reach the patient to complete the Transitions of Care (Post Inpatient/ED visit) call.   Alan Ee, RN, BSN, CEN [employer]- Transition of Care Team.  [facility] [phone]

## 2024-01-01 NOTE — Telephone Encounter (Signed)
 Patient has appt scheduled for hospital follow up

## 2024-01-02 ENCOUNTER — Telehealth: Payer: Self-pay

## 2024-01-02 NOTE — Transitions of Care (Post Inpatient/ED Visit) (Signed)
   01/02/2024  Name: Terry [name] MRN: [id number] DOB: [birth year]  Today's TOC FU Call Status: Today's TOC FU Call Status:: Unsuccessful Call (3rd Attempt) Unsuccessful Call (3rd Attempt) Date: 01/02/24  Attempted to reach the patient regarding the most recent Inpatient/ED visit.  Follow Up Plan: No further outreach attempts will be made at this time. We have been unable to contact the patient.   Benedicta Sultan J. Landrey Mahurin RN, MSN [facility], [facility] RN Care Manager Direct Dial: [phone]  Fax: [phone] Website: [email or web address]

## 2024-01-10 ENCOUNTER — Encounter: Payer: Self-pay | Admitting: Family Medicine

## 2024-01-10 ENCOUNTER — Ambulatory Visit: Admitting: Family Medicine

## 2024-01-10 VITALS — BP 133/78 | HR 88 | Temp 98.6°F | Ht 71.0 in | Wt 201.0 lb

## 2024-01-10 DIAGNOSIS — Z79891 Long term (current) use of opiate analgesic: Secondary | ICD-10-CM | POA: Diagnosis not present

## 2024-01-10 DIAGNOSIS — E1169 Type 2 diabetes mellitus with other specified complication: Secondary | ICD-10-CM | POA: Diagnosis not present

## 2024-01-10 DIAGNOSIS — R109 Unspecified abdominal pain: Secondary | ICD-10-CM | POA: Diagnosis not present

## 2024-01-10 DIAGNOSIS — I10 Essential (primary) hypertension: Secondary | ICD-10-CM | POA: Diagnosis not present

## 2024-01-10 DIAGNOSIS — R809 Proteinuria, unspecified: Secondary | ICD-10-CM

## 2024-01-10 DIAGNOSIS — E1129 Type 2 diabetes mellitus with other diabetic kidney complication: Secondary | ICD-10-CM

## 2024-01-10 DIAGNOSIS — E785 Hyperlipidemia, unspecified: Secondary | ICD-10-CM

## 2024-01-10 DIAGNOSIS — Z79899 Other long term (current) drug therapy: Secondary | ICD-10-CM | POA: Diagnosis not present

## 2024-01-10 MED ORDER — OXYCODONE-ACETAMINOPHEN 10-325 MG PO TABS: ORAL_TABLET | ORAL | 0 refills | Status: DC

## 2024-01-10 NOTE — Progress Notes (Signed)
 Subjective:    Patient ID: Terry [name], male    DOB: [birth year], 63 y.o.   MRN: [id number]  HPI  Pain management follow up - has been in ICU since last visit for ketoacidosis Discussed the use of AI scribe software for clinical note transcription with the patient, who gave verbal consent to proceed.  History of Present Illness   Terry [name] is a 63 year old male with diabetes who presents for follow-up after hospitalization for diabetic ketoacidosis and pancreatitis. He is accompanied by his partner, [name].  He was hospitalized for approximately five days due to diabetic ketoacidosis and pancreatitis. During his hospital stay, he experienced severe abdominal pain and underwent a CT scan. Since discharge, he has not had any blood work.  He is using a continuous glucose monitor but has not been fully instructed on its use. His blood glucose readings have been high, with a small amount in range and a fair amount in a high range. He is using Toujeo  at 130 units at bedtime and short-acting insulin  ranging from 35 to 41 units with meals. He reports that his insulin  doses were reduced by his previous doctor, but he is unsure of the current doses.  He experiences persistent abdominal soreness, particularly with strenuous activities such as twisting or lifting. He has not been doing much physical work around the house, except for working on a door, which he found to be strenuous. He is currently on pain medication, which helps to some degree, but he is mostly sedentary.  He has started receiving Social Security benefits and is not working. He drives during the day but avoids nighttime driving due to his diabetes.     This patient was seen today for chronic pain  The medication list was reviewed and updated.   Location of Pain for which the patient has been treated with regarding narcotics: Lumbar, knees, hands  Onset of this pain: Present for years   -Compliance with  medication: Good compliance  - Number patient states they take daily: 3 or 4  -Reason for ongoing use of opioids does not get adequate relief with Tylenol  or NSAIDs  What other measures have been tried outside of opioids Tylenol , NSAIDs  In the ongoing specialists regarding this condition none  -when was the last dose patient took?  Past 24 hours  The patient was advised the importance of maintaining medication and not using illegal substances with these.  Here for refills and follow up  The patient was educated that we can provide 3 monthly scripts for their medication, it is their responsibility to follow the instructions.  Side effects or complications from medications: Denies side effects  Patient is aware that pain medications are meant to minimize the severity of the pain to allow their pain levels to improve to allow for better function. They are aware of that pain medications cannot totally remove their pain.  Due for UDT ( at least once per year) (pain management contract is also completed at the time of the UDT): July 2025  Scale of 1 to 10 ( 1 is least 10 is most) Your pain level without the medicine: 8-9 Your pain level with medication 5  Scale 1 to 10 ( 1-helps very little, 10 helps very well) How well does your pain medication reduce your pain so you can function better through out the day?  7-8  Quality of the pain: Throbbing aching  Persistence of the pain: Present all time  Modifying factors: Worse with activity       Review of Systems     Objective:   Physical Exam General-in no acute distress Eyes-no discharge Lungs-respiratory rate normal, CTA CV-no murmurs,RRR Extremities skin warm dry no edema Neuro grossly normal Behavior normal, alert Mild abdominal pain       Assessment & Plan:  1. Encounter for long-term opiate analgesic use (Primary) The patient was seen in followup for chronic pain. A review over at their current pain status was  discussed. Drug registry was checked. Prescriptions were given.  Regular follow-up recommended. Discussion was held regarding the importance of compliance with medication as well as pain medication contract.  Patient was informed that medication may cause drowsiness and should not be combined  with other medications/alcohol or street drugs. If the patient feels medication is causing altered alertness then do not drive or operate dangerous equipment.  Should be noted that the patient appears to be meeting appropriate use of opioids and response.  Evidenced by improved function and decent pain control without significant side effects and no evidence of overt aberrancy issues.  Upon discussion with the patient today they understand that opioid therapy is optional and they feel that the pain has been refractory to reasonable conservative measures and is significant and affecting quality of life enough to warrant ongoing therapy and wishes to continue opioids.  Refills were provided.  [place]  medical Board guidelines regarding the pain medicine has been reviewed.  CDC guidelines most updated 2022 has been reviewed by the prescriber.  PDMP is checked on a regular basis yearly urine drug screen and pain management contract  Treatment plan for this patient includes #1-gentle stretching exercises as shown daily basis 2.  Mild strength exercises 3 times per week #3 continue pain medications #4 notify us  if any digression   2. Essential hypertension, benign Blood pressure good control check metabolic 7 continue current measures - Basic Metabolic Panel (BMET)  3. Diabetes mellitus with proteinuria (HCC) Patient needs to follow-up with his specialist as sugars are running high and he needs adjustment of his insulin  He is inquiring about consultation with a different endocrinologist - Basic Metabolic Panel (BMET)  4. Hyperlipidemia associated with type 2 diabetes mellitus (HCC) Recent labs look  good check lipid before next visit - Lipid Profile  5. Abdominal pain, unspecified abdominal location Patient will need to do lipase because of recent pancreatitis and ongoing abdominal pain may need to repeat CAT scan - Lipase  6. High risk medication use Labs ordered - Hepatic function panel - Magnesium

## 2024-01-11 ENCOUNTER — Ambulatory Visit: Payer: Self-pay | Admitting: Family Medicine

## 2024-01-11 DIAGNOSIS — Z79899 Other long term (current) drug therapy: Secondary | ICD-10-CM

## 2024-01-11 DIAGNOSIS — R7989 Other specified abnormal findings of blood chemistry: Secondary | ICD-10-CM

## 2024-01-11 DIAGNOSIS — Z794 Long term (current) use of insulin: Secondary | ICD-10-CM

## 2024-01-11 LAB — BASIC METABOLIC PANEL WITH GFR
BUN/Creatinine Ratio: 13 (ref 10–24)
BUN: 18 mg/dL (ref 8–27)
CO2: 24 mmol/L (ref 20–29)
Calcium: 9.8 mg/dL (ref 8.6–10.2)
Chloride: 95 mmol/L — ABNORMAL LOW (ref 96–106)
Creatinine, Ser: 1.35 mg/dL — ABNORMAL HIGH (ref 0.76–1.27)
Glucose: 174 mg/dL — ABNORMAL HIGH (ref 70–99)
Potassium: 5.6 mmol/L — ABNORMAL HIGH (ref 3.5–5.2)
Sodium: 135 mmol/L (ref 134–144)
eGFR: 59 mL/min/1.73 — ABNORMAL LOW (ref >59)

## 2024-01-11 LAB — LIPASE: Lipase: 55 U/L (ref 13–78)

## 2024-01-11 LAB — HEPATIC FUNCTION PANEL
ALT: 23 IU/L (ref 0–44)
AST: 21 IU/L (ref 0–40)
Albumin: 3.8 g/dL — ABNORMAL LOW (ref 3.9–4.9)
Alkaline Phosphatase: 50 IU/L (ref 47–123)
Bilirubin Total: <0.2 mg/dL (ref 0.0–1.2)
Bilirubin, Direct: <0.08 mg/dL (ref 0.00–0.40)
Total Protein: 7.6 g/dL (ref 6.0–8.5)

## 2024-01-11 LAB — LIPID PANEL
Chol/HDL Ratio: 4.3 ratio (ref 0.0–5.0)
Cholesterol, Total: 95 mg/dL — ABNORMAL LOW (ref 100–199)
HDL: 22 mg/dL — ABNORMAL LOW (ref >39)
LDL Chol Calc (NIH): 23 mg/dL (ref 0–99)
Triglycerides: 349 mg/dL — ABNORMAL HIGH (ref 0–149)
VLDL Cholesterol Cal: 50 mg/dL — ABNORMAL HIGH (ref 5–40)

## 2024-01-11 LAB — MAGNESIUM: Magnesium: 1.6 mg/dL (ref 1.6–2.3)

## 2024-01-15 ENCOUNTER — Telehealth: Payer: Self-pay

## 2024-01-15 ENCOUNTER — Other Ambulatory Visit

## 2024-01-15 NOTE — Progress Notes (Signed)
 Complex Care Management Care Guide Note  01/15/2024 Name: Kobe Ofallon MRN: [id number] DOB: 22-Feb-1961  Lowell Makara is a 63 y.o. year old male who is a primary care patient of Luking, Glendia LABOR, MD and is actively engaged with the care management team. I reached out to [name] by phone today to assist with re-scheduling  with the Pharmacist.  Follow up plan: Unsuccessful telephone outreach attempt made. A HIPAA compliant phone message was left for the patient providing contact information and requesting a return call.  Jeoffrey Buffalo , RMA     [facility]  [facility], [facility] Guide  Direct Dial: [phone]  Website: [email or web address]

## 2024-01-15 NOTE — Progress Notes (Deleted)
   01/15/2024 Name: Terry [name] MRN: [id number] DOB: [birth year]  No chief complaint on file.   Terry [name] is a 63 y.o. year old male who presented for a telephone visit.   They were referred to the pharmacist by their PCP for assistance in managing {referralreason:27271}.    Subjective:  Care Team: Primary Care Provider: Alphonsa Glendia LABOR, MD ; Next Scheduled Visit: *** {careteamprovider:27366}  Medication Access/Adherence  Current Pharmacy:  [facility] - [place], [place] - [address] [place] [address] Phone: [phone] Fax: [phone]   Patient reports affordability concerns with their medications: {YES/NO:21197} Patient reports access/transportation concerns to their pharmacy: {YES/NO:21197} Patient reports adherence concerns with their medications:  {YES/NO:21197} ***   Diabetes:  Current medications: Touje, glipizide  Medications tried in the past: ***  Current glucose readings: ***    Patient {Actions; denies-reports:120008} hypoglycemic s/sx including ***dizziness, shakiness, sweating. Patient {Actions; denies-reports:120008} hyperglycemic symptoms including ***polyuria, polydipsia, polyphagia, nocturia, neuropathy, blurred vision.  Current meal patterns:  - Breakfast: *** - Lunch *** - Supper *** - Snacks *** - Drinks ***  Current physical activity: ***  Current medication access support: ***  Macrovascular and Microvascular Risk Reduction:  Statin? {DM Statin Pharmacy:33491}; ACEi/ARB? {DM ACEi/ARB pharmacy:33492} Last urinary albumin/creatinine ratio:  Lab Results  Component Value Date   MICRALBCREAT 188 (H) 09/08/2023   MICRALBCREAT 156 (H) 01/27/2023   MICRALBCREAT 177 (H) 03/17/2022   MICRALBCREAT 30-300 07/09/2021   MICRALBCREAT 135 (H) 12/18/2020   MICRALBCREAT 37 (H) 10/29/2018   MICRALBCREAT 74.2 (H) 01/20/2018   MICRALBCREAT 41.2 (H) 10/25/2016   MICRALBCREAT 51.5 (H) 08/01/2016    MICRALBCREAT 51.1 (H) 06/20/2015   Last eye exam:  Lab Results  Component Value Date   HMDIABEYEEXA No Retinopathy 12/10/2021   Last foot exam: 11/04/2022 Tobacco Use:  Tobacco Use: Medium Risk (01/10/2024)   Patient History    Smoking Tobacco Use: Former    Smokeless Tobacco Use: Never    Passive Exposure: Not on file     Objective:  Lab Results  Component Value Date   HGBA1C 11.6 (H) 12/26/2023    Lab Results  Component Value Date   CREATININE 1.35 (H) 01/10/2024   BUN 18 01/10/2024   NA 135 01/10/2024   K 5.6 (H) 01/10/2024   CL 95 (L) 01/10/2024   CO2 24 01/10/2024    Lab Results  Component Value Date   CHOL 95 (L) 01/10/2024   HDL 22 (L) 01/10/2024   LDLCALC 23 01/10/2024   TRIG 349 (H) 01/10/2024   CHOLHDL 4.3 01/10/2024    Medications Reviewed Today   Medications were not reviewed in this encounter       Assessment/Plan:   {Pharmacy A/P Choices:26421}  Follow Up Plan: ***  ***

## 2024-01-18 NOTE — Addendum Note (Signed)
 Addended by: DELORES LIONEL RAMAN on: 01/18/2024 02:37 PM   Modules accepted: Orders

## 2024-01-24 NOTE — Telephone Encounter (Signed)
 Lab order placed.

## 2024-01-24 NOTE — Progress Notes (Signed)
 Complex Care Management Care Guide Note  01/24/2024 Name: Terry [name] MRN: [id number] DOB: [birth year]  Terry [name] is a 63 y.o. year old male who is a primary care patient of Luking, Glendia LABOR, MD and is actively engaged with the care management team. I reached out to [name] by phone today to assist with re-scheduling  with the Pharmacist.  Follow up plan: Unsuccessful telephone outreach attempt made. A HIPAA compliant phone message was left for the patient providing contact information and requesting a return call.  Jeoffrey Buffalo , RMA     [facility]  [facility], [facility] Guide  Direct Dial: [phone]  Website: [email or web address]

## 2024-02-02 ENCOUNTER — Other Ambulatory Visit: Payer: Self-pay | Admitting: Nurse Practitioner

## 2024-02-02 ENCOUNTER — Encounter: Payer: Self-pay | Admitting: Family Medicine

## 2024-02-02 MED ORDER — METOPROLOL SUCCINATE ER 100 MG PO TB24: ORAL_TABLET | ORAL | 1 refills | Status: DC

## 2024-02-05 ENCOUNTER — Ambulatory Visit: Payer: Self-pay

## 2024-02-05 NOTE — Telephone Encounter (Signed)
 FYI Only or Action Required?: Action required by provider: update on patient condition.  Patient was last seen in primary care on 01/10/2024 by Alphonsa Glendia LABOR, MD.  Called Nurse Triage reporting Medication Problem.  Triage Disposition: Call PCP When Office is Open  Patient/caregiver understands and will follow disposition?: Yes            Reason for Disposition  [1] Caller has NON-URGENT medicine question about med that PCP prescribed AND [2] triager unable to answer question  Answer Assessment - Initial Assessment Questions Pt has not taken the metoprolol  succinate 100 mg bid in three days. Pt stopped taking it after his BP dropped (68/44 when this happened)  Currently: No symptoms 125/71 BP and HR 89 today  Pt wants direction on what to do now that he has stopped taking the metoprolol .  Protocols used: Medication Question Call-A-AH

## 2024-02-05 NOTE — Telephone Encounter (Signed)
 Copied from CRM [id number]. Topic: Clinical - Prescription Issue >> Feb 05, 2024  3:36 PM Avram MATSU wrote: Reason for CRM: patient stated his medication was changed metoprolol  succinate (TOPROL -XL) 100 MG 24 hr tablet from once a day to twice a day. Patient stated the rx number has changed as well. Patient also mentioned his BP dropped recently. Patient has stopped taking it for three days and wont take it until he know more information. please advise [phone]

## 2024-02-06 ENCOUNTER — Encounter: Payer: Self-pay | Admitting: Family Medicine

## 2024-02-06 NOTE — Telephone Encounter (Signed)
 I tried calling no response left message I sent MyChart message regarding metoprolol  patient to send us  update

## 2024-02-07 ENCOUNTER — Encounter: Payer: Self-pay | Admitting: Family Medicine

## 2024-02-09 ENCOUNTER — Ambulatory Visit: Payer: Self-pay | Admitting: Family Medicine

## 2024-02-09 ENCOUNTER — Encounter: Payer: Self-pay | Admitting: Family Medicine

## 2024-02-09 VITALS — BP 171/74 | HR 97 | Temp 98.2°F | Ht 71.0 in | Wt 200.0 lb

## 2024-02-09 DIAGNOSIS — I10 Essential (primary) hypertension: Secondary | ICD-10-CM | POA: Diagnosis not present

## 2024-02-09 DIAGNOSIS — R002 Palpitations: Secondary | ICD-10-CM | POA: Diagnosis not present

## 2024-02-09 NOTE — Progress Notes (Signed)
   Subjective:    Patient ID: Terry [name], male    DOB: 09-04-1960, 63 y.o.   MRN: [id number] CC: follow up on medication  HPI 63 year old, male, arrives for a follow up visit on his blood pressure medication. He states that at his cardiology appointment in 9/25 that he was instructed to take his metropolol succ er 100mg  1 tab bid. Ever since his BP has been dropping and he has been feeling lightheaded and dizzy. He began taking a BP log which he as brought in today. He brought in all of his medication for review. He has been taking all of his other medications as directed. He stopped taking the metoprolol  succ er 100mg  all together 3 days before this visit.    Review of Systems  Constitutional:  Negative for activity change.  Respiratory:  Negative for cough, shortness of breath and wheezing.   Cardiovascular:  Negative for chest pain.  Gastrointestinal:  Negative for abdominal pain, constipation, diarrhea, nausea and vomiting.  Neurological:  Positive for dizziness and light-headedness.       Objective:   Physical Exam Constitutional:      General: He is not in acute distress.    Appearance: Normal appearance.  Cardiovascular:     Rate and Rhythm: Normal rate. Rhythm irregular.     Heart sounds: Murmur heard.  Pulmonary:     Effort: Pulmonary effort is normal.     Breath sounds: Normal breath sounds.  Skin:    General: Skin is warm and dry.  Neurological:     Mental Status: He is alert and oriented to person, place, and time.  Psychiatric:        Mood and Affect: Mood normal.        Behavior: Behavior normal.      Vitals:   02/09/24 1527  BP: (!) 171/74  Pulse: 97  Temp: 98.2 F (36.8 C)  Height: 5' 11 (1.803 m)  Weight: 90.7 kg  SpO2: 97%  BMI (Calculated): 27.91        Assessment & Plan:  1. Essential hypertension, benign (Primary) Resume taking the Metoprolol  100 mg succ ER 0.5 (1/2) tab today and resume 1 tab po every day from this day forth. If  your symptoms of lightheadedness, dizziness and low blood pressure resume, go back to 0.5 1/2) tablet daily until follow up visit.  Continue to maintain a BP log and bring it in to the next visit.  Bring in your home BP machine to the next visit.  - EKG 12-Lead  2. Palpitation - EKG 12-Lead   Return in about 1 week (around 02/16/2024).

## 2024-02-13 ENCOUNTER — Ambulatory Visit: Admitting: Family Medicine

## 2024-02-13 ENCOUNTER — Encounter: Payer: Self-pay | Admitting: Family Medicine

## 2024-02-13 VITALS — BP 132/60 | HR 81 | Temp 98.8°F | Ht 71.0 in | Wt 202.0 lb

## 2024-02-13 DIAGNOSIS — R002 Palpitations: Secondary | ICD-10-CM | POA: Diagnosis not present

## 2024-02-13 DIAGNOSIS — I10 Essential (primary) hypertension: Secondary | ICD-10-CM | POA: Diagnosis not present

## 2024-02-13 NOTE — Progress Notes (Signed)
   Subjective:    Patient ID: Terry [name], male    DOB: 1960-12-24, 63 y.o.   MRN: [id number] BP f/u. HPI Patient here today for follow-up we started metoprolol  on his last visit Overall he feels he is doing pretty well Blood pressure doing well no heart irregularity   Review of Systems     Objective:   Physical Exam  General-in no acute distress Eyes-no discharge Lungs-respiratory rate normal, CTA CV-no murmurs,RRR Extremities skin warm dry no edema Neuro grossly normal Behavior normal, alert       Assessment & Plan:  HTN good control Arrhythmia issue is cleared up with the metoprolol  He was encouraged to keep his next follow-up visit Follow-up sooner if any problems

## 2024-02-14 ENCOUNTER — Ambulatory Visit: Payer: Self-pay | Admitting: Family Medicine

## 2024-02-26 NOTE — Progress Notes (Deleted)
 Cardiology Office Note:  .   Date:  02/26/2024  ID:  Terry [name], DOB 1961/03/11, MRN [id number] PCP: Alphonsa Glendia LABOR, MD  [facility] Providers Cardiologist:  Alvan Carrier, MD {   History of Present Illness: .   Terry [name] is a 63 y.o. male  with PMHx of paroxysmal atrial fibrillation (transient episode in 03/2019 while admitted for COVID and DKA with no recurrence of outpatient monitor), Type 2 DM, HTN, HLD, aortic stenosis (10/2023: mild) who reports to [facility] office for 3 month follow up.   Last seen in heartcare 12/20/2023 by myself. Reported ongoing chronic unchanged SOB with longer distances that resolves with rest. Also noted improvement in Iightheadness with position changes since retirement. BP elevated in office 152/70 to 160/80. Patient noted BP usually more elevated in office. Increased Valsartan  to 320 mg (max dose). Continued on amlodipine  10 mg daily, Lozol  2.5 mg daily, Toprol  XL 100 mg daily, ASA 81 mg, Crestor  40 mg daily, Fenofibrate  160 mg daily, Zetia  10 mg,   Recent hospitalization 9/28-10/04/2023 admitted for DKA. Hospital course also complicated with pancreatitis, possible pneumonia (+rhinovirus, treated with IV abx), acute hypoxic respiratory failure, acute on CKD (improved to baseline 1.4-1.7 with IV fluids), hypophosphatemia, hypomagnesemia.  Continued on all cardiac meds as above plus Augmentin  x 3 days.   Today, reports ### and denies ###.  Denies chest pain, shortness of breath, palpitations, syncope, presyncope, dizziness, orthopnea, PND, swelling or significant weight changes, acute bleeding, or claudication.   Reports compliance with medications.  Dietary habitats: Reports very poor diet including high sodium, high sugar and a lot of soda. Notes that endocrinology encouraged patient to become vegan for better A1c control but patient is not interested. ?? 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  Patient is able to complete yard work and walk around grocery store without any exertional concerns. ??? Social: Denies tobacco use/alcohol/drug use  Denies any recent hospitalizations or visits to the emergency department.   Essential hypertension, benign Home BP usually 130/90s and usually more elevated in medical offices BP this OV elevated 152/70 with repeat BP 160/80.  K elevated 5.6 and Cr 1.35  in 12/2023.  Discussed optimal BP < 130/80 with history of diabetes.  Increase valsartan  to 320 mg daily (max dose).  Order Bmet in 1 to 2 weeks.  BP log over 4 weeks.  If BP remains elevated then would consider increasing Toprol  XL to 100 mg in the a.m. and 50 mg in the p.m. Decrease Valsartan  from 320 mg to 160 mg and Increase Toprol  XL to 100 mg in the am and 50 mg in pm???  Continue on amlodipine  10 mg daily, Lozol  2.5 mg daily, Toprol -XL 100 mg daily  Encourage physical activity for 150 minutes per week and heart healthy low sodium diet. Discussed limiting sodium intake to < 2 grams daily.  Encouraged to decrease soda consumption and increase water  intake.    Hyperlipidemia LDL goal <100 LDL 48 in 08/2023 and LFT WNL 05/2023  LDL 23, Triglycerides 349, LFT WNL on 01/10/2024 Continue on Crestor  40 mg daily, fenofibrate  160 mg daily, Zetia  10 mg daily     Paroxysmal atrial fibrillation (HCC) Previous isolated episode of afib during admission in 03/2019 in setting of COVID and DKA with conversion back to normal sinus rhythm and he did not have any recurrence by outpatient monitor. He denies any palpitations and no signs of A-fib on EKG today. Not on [facility]  due to isolated event.   Type 2 diabetes mellitus without complication, unspecified whether long term insulin  use (HCC) A1C 9.7 in 08/2023 to 11.6 in 11/2023.  Encouraged low-carb diet. Continue to follow with Endocrinology    Mild Aortic Stenosis  ECHO 2021: Mild to moderate AV sclerosis/calcification without AS ECHO 10/2023 reviewed and  showed mild AS. Noted 3/6 systolic murmur in RUSB/LUSB on exam.  Denies any syncope or worsening SOB.  Will plan to repeat ECHO in 1 year.   AKI  Recent hospitalization with AKI but Cr improved to baseline 1.4-1.7  Cr 1.35 on 01/10/2024  ROS: 10 point review of system has been reviewed and considered negative except ones been listed in the HPI.   Studies Reviewed: SABRA   ECHO IMPRESSIONS 10/2023  1. Left ventricular ejection fraction, by estimation, is 60 to 65%. The  left ventricle has normal function. The left ventricle has no regional  wall motion abnormalities. There is mild left ventricular hypertrophy.  Left ventricular diastolic parameters  were normal.   2. Right ventricular systolic function is normal. The right ventricular  size is normal.   3. Trivial mitral valve regurgitation.   4. AV is thickened, calcified with mildly restricted motion. Peak and  mean gradients thruogh the valve are 34 and 18 mm Hg respectively AVA (VTI) is 1.33 cm2.      Dimensionless index is 0.46 Overall consistent with mild aortic  stenosis. . The aortic valve is tricuspid. Aortic valve regurgitation is  not visualized.  Comparison(s): The left ventricular function is unchanged.  Risk Assessment/Calculations:   {Does this patient have ATRIAL FIBRILLATION?:[id number]} No BP recorded.  {Refresh Note OR Click here to enter BP  :1}***       Physical Exam:   VS:  There were no vitals taken for this visit.   Wt Readings from Last 3 Encounters:  02/13/24 202 lb (91.6 kg)  02/09/24 200 lb (90.7 kg)  01/10/24 201 lb (91.2 kg)    GEN: Well nourished, well developed in no acute distress while sitting in chair.  NECK: No JVD; No carotid bruits CARDIAC: ***RRR, no murmurs, rubs, gallops RESPIRATORY:  Clear to auscultation without rales, wheezing or rhonchi  ABDOMEN: Soft, non-tender, non-distended EXTREMITIES:  No edema; No deformity   ASSESSMENT AND PLAN: .   ***    {Are you ordering a CV Procedure  (e.g. stress test, cath, DCCV, TEE, etc)?   Press F2        :[id number]}  Dispo: ***  Signed, Lorette CINDERELLA Kapur, PA-C

## 2024-02-27 ENCOUNTER — Ambulatory Visit: Attending: Physician Assistant | Admitting: Physician Assistant

## 2024-03-09 ENCOUNTER — Other Ambulatory Visit: Payer: Self-pay | Admitting: Nurse Practitioner

## 2024-03-11 ENCOUNTER — Telehealth: Payer: Self-pay | Admitting: "Endocrinology

## 2024-03-11 MED ORDER — HUMALOG KWIKPEN 100 UNIT/ML ~~LOC~~ SOPN: 0.0000 [IU] | PEN_INJECTOR | Freq: Three times a day (TID) | SUBCUTANEOUS | 0 refills | Status: DC

## 2024-03-11 NOTE — Telephone Encounter (Signed)
 [facility] is requesting Humalog  to be sent in. Thanks

## 2024-03-11 NOTE — Telephone Encounter (Signed)
Courtesy refill sent  

## 2024-03-12 ENCOUNTER — Encounter: Payer: Self-pay | Admitting: Family Medicine

## 2024-03-12 ENCOUNTER — Telehealth: Payer: Self-pay | Admitting: Pharmacy Technician

## 2024-03-12 ENCOUNTER — Telehealth: Payer: Self-pay | Admitting: Family Medicine

## 2024-03-12 ENCOUNTER — Other Ambulatory Visit: Payer: Self-pay | Admitting: Family Medicine

## 2024-03-12 ENCOUNTER — Other Ambulatory Visit (HOSPITAL_COMMUNITY): Payer: Self-pay

## 2024-03-12 DIAGNOSIS — R7989 Other specified abnormal findings of blood chemistry: Secondary | ICD-10-CM | POA: Diagnosis not present

## 2024-03-12 DIAGNOSIS — Z794 Long term (current) use of insulin: Secondary | ICD-10-CM | POA: Diagnosis not present

## 2024-03-12 DIAGNOSIS — E1165 Type 2 diabetes mellitus with hyperglycemia: Secondary | ICD-10-CM | POA: Diagnosis not present

## 2024-03-12 MED ORDER — HUMALOG KWIKPEN 100 UNIT/ML ~~LOC~~ SOPN: PEN_INJECTOR | SUBCUTANEOUS | 2 refills | Status: DC

## 2024-03-12 NOTE — Telephone Encounter (Signed)
 PA request has been Received. New Encounter has been or will be created for follow up. For additional info see Pharmacy Prior Auth telephone encounter from 03/12/2024.

## 2024-03-12 NOTE — Telephone Encounter (Signed)
 Please see MyChart message Patient needs prior approval on his Humalog  He is in between endocrinology so therefore we are his endocrinologist for now Please have prior approval team work toward getting this approved so he has his insulin  this week thank you

## 2024-03-12 NOTE — Telephone Encounter (Signed)
 Nurses The corrected prescription for Humalog  was sent to his pharmacy this at [facility] He is currently in between endocrinologist We will need prior authorization on this medicine to help him get his necessary insulin  Thanks-Dr. Glendia

## 2024-03-12 NOTE — Telephone Encounter (Signed)
 Pharmacy Patient Advocate Encounter   Received notification from Pt Calls Messages that prior authorization for Humalog  100 unit/ml Kwikpen is required/requested.   Insurance verification completed.   The patient is insured through [employer].   Per test claim:  generic insulin  lispro 100unit/ml kary is preferred by the insurance.  If suggested medication is appropriate, Please send in a new RX and discontinue this one. If not, please advise as to why it's not appropriate so that we may request a Prior Authorization. Please note, some preferred medications may still require a PA.  If the suggested medications have not been trialed and there are no contraindications to their use, the PA will not be submitted, as it will not be approved.  Rx written for Brand name Dispense as written. Any reason why he cannot take the covered generic? Generic is covered with a $4.00 copay. Please advise.

## 2024-03-13 ENCOUNTER — Other Ambulatory Visit: Payer: Self-pay | Admitting: Family Medicine

## 2024-03-13 LAB — COMPREHENSIVE METABOLIC PANEL WITH GFR
ALT: 33 IU/L (ref 0–44)
AST: 39 IU/L (ref 0–40)
Albumin: 4.8 g/dL (ref 3.9–4.9)
Alkaline Phosphatase: 41 IU/L — ABNORMAL LOW (ref 47–123)
BUN/Creatinine Ratio: 20 (ref 10–24)
BUN: 31 mg/dL — ABNORMAL HIGH (ref 8–27)
Bilirubin Total: 0.2 mg/dL (ref 0.0–1.2)
CO2: 21 mmol/L (ref 20–29)
Calcium: 10.3 mg/dL — ABNORMAL HIGH (ref 8.6–10.2)
Chloride: 98 mmol/L (ref 96–106)
Creatinine, Ser: 1.54 mg/dL — ABNORMAL HIGH (ref 0.76–1.27)
Globulin, Total: 2.7 g/dL (ref 1.5–4.5)
Glucose: 240 mg/dL — ABNORMAL HIGH (ref 70–99)
Potassium: 4.1 mmol/L (ref 3.5–5.2)
Sodium: 139 mmol/L (ref 134–144)
Total Protein: 7.5 g/dL (ref 6.0–8.5)
eGFR: 51 mL/min/1.73 — ABNORMAL LOW (ref >59)

## 2024-03-13 MED ORDER — INSULIN LISPRO (1 UNIT DIAL) 100 UNIT/ML (KWIKPEN): PEN_INJECTOR | SUBCUTANEOUS | 11 refills | Status: AC

## 2024-03-13 NOTE — Telephone Encounter (Signed)
 I sent in the new prescription hopefully it is covered

## 2024-03-14 ENCOUNTER — Other Ambulatory Visit (HOSPITAL_COMMUNITY): Payer: Self-pay

## 2024-03-26 ENCOUNTER — Other Ambulatory Visit: Payer: Self-pay | Admitting: Nurse Practitioner

## 2024-04-09 ENCOUNTER — Ambulatory Visit: Admitting: Family Medicine

## 2024-04-09 ENCOUNTER — Other Ambulatory Visit: Payer: Self-pay

## 2024-04-09 ENCOUNTER — Encounter: Payer: Self-pay | Admitting: Family Medicine

## 2024-04-09 VITALS — BP 144/68 | HR 72 | Temp 97.7°F | Ht 71.0 in | Wt 206.4 lb

## 2024-04-09 DIAGNOSIS — I10 Essential (primary) hypertension: Secondary | ICD-10-CM | POA: Diagnosis not present

## 2024-04-09 DIAGNOSIS — E1122 Type 2 diabetes mellitus with diabetic chronic kidney disease: Secondary | ICD-10-CM

## 2024-04-09 DIAGNOSIS — Z79891 Long term (current) use of opiate analgesic: Secondary | ICD-10-CM

## 2024-04-09 DIAGNOSIS — E785 Hyperlipidemia, unspecified: Secondary | ICD-10-CM | POA: Diagnosis not present

## 2024-04-09 DIAGNOSIS — Z79899 Other long term (current) drug therapy: Secondary | ICD-10-CM

## 2024-04-09 DIAGNOSIS — Z794 Long term (current) use of insulin: Secondary | ICD-10-CM

## 2024-04-09 DIAGNOSIS — E1165 Type 2 diabetes mellitus with hyperglycemia: Secondary | ICD-10-CM

## 2024-04-09 DIAGNOSIS — N1831 Chronic kidney disease, stage 3a: Secondary | ICD-10-CM | POA: Diagnosis not present

## 2024-04-09 DIAGNOSIS — M1990 Unspecified osteoarthritis, unspecified site: Secondary | ICD-10-CM

## 2024-04-09 DIAGNOSIS — E1169 Type 2 diabetes mellitus with other specified complication: Secondary | ICD-10-CM | POA: Diagnosis not present

## 2024-04-09 DIAGNOSIS — E1129 Type 2 diabetes mellitus with other diabetic kidney complication: Secondary | ICD-10-CM

## 2024-04-09 MED ORDER — OXYCODONE-ACETAMINOPHEN 10-325 MG PO TABS: ORAL_TABLET | ORAL | 0 refills | Status: AC

## 2024-04-09 MED ORDER — ROSUVASTATIN CALCIUM 40 MG PO TABS: 40.0000 mg | ORAL_TABLET | Freq: Every day | ORAL | 3 refills | Status: AC

## 2024-04-09 MED ORDER — TOUJEO MAX SOLOSTAR 300 UNIT/ML ~~LOC~~ SOPN: PEN_INJECTOR | SUBCUTANEOUS | 5 refills | Status: AC

## 2024-04-09 MED ORDER — METOPROLOL SUCCINATE ER 100 MG PO TB24: ORAL_TABLET | ORAL | 1 refills | Status: AC

## 2024-04-09 MED ORDER — EZETIMIBE 10 MG PO TABS: 10.0000 mg | ORAL_TABLET | Freq: Every day | ORAL | 3 refills | Status: AC

## 2024-04-09 MED ORDER — INDAPAMIDE 2.5 MG PO TABS: ORAL_TABLET | ORAL | 3 refills | Status: AC

## 2024-04-09 MED ORDER — FENOFIBRATE 160 MG PO TABS: 160.0000 mg | ORAL_TABLET | Freq: Every day | ORAL | 1 refills | Status: DC

## 2024-04-09 NOTE — Progress Notes (Signed)
 "  Subjective:    Patient ID: Terry [name], male    DOB: July 25, 1960, 64 y.o.   MRN: [id number]  HPI Patient is in room 9  Patient is here for pain management  This patient was seen today for chronic pain  The medication list was reviewed and updated.   Location of Pain for which the patient has been treated with regarding narcotics: Back pain knee pain hand pain wrist pain osteoarthritis severe  Onset of this pain: Several years   -Compliance with medication: Good compliance  - Number patient states they take daily: 3 or 4 daily  -Reason for ongoing use of opioids cannot get adequate relief with Tylenol  and NSAIDs  What other measures have been tried outside of opioids Tylenol  NSAIDs  In the ongoing specialists regarding this condition none currently  -when was the last dose patient took?  Past 24 hours  The patient was advised the importance of maintaining medication and not using illegal substances with these.  Here for refills and follow up  The patient was educated that we can provide 3 monthly scripts for their medication, it is their responsibility to follow the instructions.  Side effects or complications from medications: Denies side effects  Patient is aware that pain medications are meant to minimize the severity of the pain to allow their pain levels to improve to allow for better function. They are aware of that pain medications cannot totally remove their pain.  Due for UDT ( at least once per year) (pain management contract is also completed at the time of the UDT): This July  Scale of 1 to 10 ( 1 is least 10 is most) Your pain level without the medicine: 9 Your pain level with medication 6-7  Scale 1 to 10 ( 1-helps very little, 10 helps very well) How well does your pain medication reduce your pain so you can function better through out the day? 8-9  Quality of the pain: Throbbing aching  Persistence of the pain: Present all time  Modifying  factors: Worse with activity  Results for orders placed or performed in visit on 01/11/24  Comprehensive metabolic panel with GFR   Collection Time: 03/12/24 10:41 AM  Result Value Ref Range   Glucose 240 (H) 70 - 99 mg/dL   BUN 31 (H) 8 - 27 mg/dL   Creatinine, Ser 8.45 (H) 0.76 - 1.27 mg/dL   eGFR 51 (L) >40 fO/fpw/8.26   BUN/Creatinine Ratio 20 10 - 24   Sodium 139 134 - 144 mmol/L   Potassium 4.1 3.5 - 5.2 mmol/L   Chloride 98 96 - 106 mmol/L   CO2 21 20 - 29 mmol/L   Calcium  10.3 (H) 8.6 - 10.2 mg/dL   Total Protein 7.5 6.0 - 8.5 g/dL   Albumin  4.8 3.9 - 4.9 g/dL   Globulin, Total 2.7 1.5 - 4.5 g/dL   Bilirubin Total 0.2 0.0 - 1.2 mg/dL   Alkaline Phosphatase 41 (L) 47 - 123 IU/L   AST 39 0 - 40 IU/L   ALT 33 0 - 44 IU/L    Patient relates his diabetes numbers and sometimes look good other times are quite high he denies any misuse of his medicine states he is consistent monitors it denies any low sugar spells He is trying to get in with endocrinology a referral was sent 3 months ago but again heard nothing back Has a history of DKA      Review of Systems  Objective:   Physical Exam General-in no acute distress Eyes-no discharge Lungs-respiratory rate normal, CTA CV-no murmurs,RRR Extremities skin warm dry no edema Neuro grossly normal Behavior normal, alert        Assessment & Plan:  1. Type 2 diabetes mellitus with stage 3a chronic kidney disease, with long-term current use of insulin  (HCC) Check A1c Continue insulin  Healthy diet  - insulin  glargine, 2 Unit Dial , (TOUJEO  MAX SOLOSTAR) 300 UNIT/ML Solostar Pen; 140 units near bedtime daily  Dispense: 45 mL; Refill: 5 - Basic metabolic panel with GFR - Hemoglobin A1c - Microalbumin/Creatinine Ratio, Urine  2. Encounter for long-term opiate analgesic use The patient was seen in followup for chronic pain. A review over at their current pain status was discussed. Drug registry was  checked. Prescriptions were given.  Regular follow-up recommended. Discussion was held regarding the importance of compliance with medication as well as pain medication contract.  Patient was informed that medication may cause drowsiness and should not be combined  with other medications/alcohol or street drugs. If the patient feels medication is causing altered alertness then do not drive or operate dangerous equipment.  Should be noted that the patient appears to be meeting appropriate use of opioids and response.  Evidenced by improved function and decent pain control without significant side effects and no evidence of overt aberrancy issues.  Upon discussion with the patient today they understand that opioid therapy is optional and they feel that the pain has been refractory to reasonable conservative measures and is significant and affecting quality of life enough to warrant ongoing therapy and wishes to continue opioids.  Refills were provided.  [place]  medical Board guidelines regarding the pain medicine has been reviewed.  CDC guidelines most updated 2022 has been reviewed by the prescriber.  PDMP is checked on a regular basis yearly urine drug screen and pain management contract  Treatment plan for this patient includes #1-gentle stretching exercises as shown daily basis 2.  Mild strength exercises 3 times per week #3 continue pain medications #4 notify us  if any digression Medications were updated continue current measures - oxyCODONE -acetaminophen  (PERCOCET) 10-325 MG tablet; 1  taken 3 or 4 times daily as needed for pain  Dispense: 100 tablet; Refill: 0  3. Essential hypertension, benign (Primary) Blood pressure elevated here but readings at home are good  4. Type 2 diabetes mellitus with hyperglycemia, with long-term current use of insulin  (HCC) Need to get him in with endocrinology have sent referral previously but heard nothing back we will send it again patient will keep  us  updated on readings - Microalbumin/Creatinine Ratio, Urine  5. High risk medication use Labs per above  6. Hyperlipidemia associated with type 2 diabetes mellitus (HCC) Continue statin check labs await results - Lipid panel  7. Osteoarthritis, unspecified osteoarthritis type, unspecified site Gradually progressive patient is disabled and stays at home currently does small projects around the house but not much otherwise  Have encouraged him to try to do some walking on a regular basis  Follow-up 3 months   "

## 2024-04-10 ENCOUNTER — Telehealth: Payer: Self-pay | Admitting: Pharmacy Technician

## 2024-04-10 ENCOUNTER — Other Ambulatory Visit (HOSPITAL_COMMUNITY): Payer: Self-pay

## 2024-04-10 NOTE — Telephone Encounter (Signed)
 Pharmacy Patient Advocate Encounter   Received notification from [facility] that prior authorization for Fenofibrate  160MG  tablets is required/requested.   Insurance verification completed.   The patient is insured through [employer].   Per test claim:  fenofibrate  145mg  tablets(Tricor ) is preferred by the insurance.  If suggested medication is appropriate, Please send in a new RX and discontinue this one. If not, please advise as to why it's not appropriate so that we may request a Prior Authorization. Please note, some preferred medications may still require a PA.  If the suggested medications have not been trialed and there are no contraindications to their use, the PA will not be submitted, as it will not be approved. Archived Key: [id number]

## 2024-04-15 ENCOUNTER — Other Ambulatory Visit: Payer: Self-pay | Admitting: Family Medicine

## 2024-04-15 MED ORDER — FENOFIBRATE 145 MG PO TABS: 145.0000 mg | ORAL_TABLET | Freq: Every day | ORAL | 1 refills | Status: AC

## 2024-04-15 NOTE — Telephone Encounter (Signed)
 FYI-message handled-should be noted that we change the medicine to the fenofibrate  145 this should be covered and not require a prior approval

## 2024-07-11 ENCOUNTER — Ambulatory Visit: Admitting: Family Medicine

## 2024-08-13 ENCOUNTER — Ambulatory Visit: Admitting: Endocrinology

## 2024-10-07 ENCOUNTER — Encounter: Admitting: Family Medicine
